# Patient Record
Sex: Male | Born: 1955
Health system: Southern US, Community
[De-identification: ages and names within clinical notes are randomized; demographics above are authoritative.]

## PROBLEM LIST (undated history)

## (undated) DIAGNOSIS — Z860100 Personal history of colon polyps, unspecified: Secondary | ICD-10-CM

## (undated) DIAGNOSIS — R600 Localized edema: Secondary | ICD-10-CM

## (undated) DIAGNOSIS — M2550 Pain in unspecified joint: Secondary | ICD-10-CM

## (undated) DIAGNOSIS — G4730 Sleep apnea, unspecified: Secondary | ICD-10-CM

## (undated) DIAGNOSIS — G4733 Obstructive sleep apnea (adult) (pediatric): Secondary | ICD-10-CM

## (undated) DIAGNOSIS — I5189 Other ill-defined heart diseases: Secondary | ICD-10-CM

## (undated) DIAGNOSIS — H9190 Unspecified hearing loss, unspecified ear: Secondary | ICD-10-CM

## (undated) DIAGNOSIS — E039 Hypothyroidism, unspecified: Secondary | ICD-10-CM

## (undated) DIAGNOSIS — H9319 Tinnitus, unspecified ear: Secondary | ICD-10-CM

## (undated) DIAGNOSIS — K829 Disease of gallbladder, unspecified: Secondary | ICD-10-CM

## (undated) DIAGNOSIS — E079 Disorder of thyroid, unspecified: Secondary | ICD-10-CM

## (undated) DIAGNOSIS — M1990 Unspecified osteoarthritis, unspecified site: Secondary | ICD-10-CM

## (undated) DIAGNOSIS — F32A Depression, unspecified: Secondary | ICD-10-CM

## (undated) DIAGNOSIS — M549 Dorsalgia, unspecified: Secondary | ICD-10-CM

## (undated) DIAGNOSIS — I209 Angina pectoris, unspecified: Secondary | ICD-10-CM

## (undated) DIAGNOSIS — E782 Mixed hyperlipidemia: Secondary | ICD-10-CM

## (undated) DIAGNOSIS — I10 Essential (primary) hypertension: Secondary | ICD-10-CM

## (undated) DIAGNOSIS — F419 Anxiety disorder, unspecified: Secondary | ICD-10-CM

## (undated) DIAGNOSIS — R0602 Shortness of breath: Secondary | ICD-10-CM

## (undated) DIAGNOSIS — I2510 Atherosclerotic heart disease of native coronary artery without angina pectoris: Secondary | ICD-10-CM

## (undated) DIAGNOSIS — K219 Gastro-esophageal reflux disease without esophagitis: Secondary | ICD-10-CM

## (undated) DIAGNOSIS — G510 Bell's palsy: Secondary | ICD-10-CM

## (undated) DIAGNOSIS — T300 Burn of unspecified body region, unspecified degree: Secondary | ICD-10-CM

## (undated) DIAGNOSIS — F329 Major depressive disorder, single episode, unspecified: Secondary | ICD-10-CM

## (undated) DIAGNOSIS — G709 Myoneural disorder, unspecified: Secondary | ICD-10-CM

## (undated) HISTORY — DX: Mixed hyperlipidemia: E78.2

## (undated) HISTORY — DX: Unspecified hearing loss, unspecified ear: H91.90

## (undated) HISTORY — PX: SKIN GRAFT: SHX250

## (undated) HISTORY — DX: Pain in unspecified joint: M25.50

## (undated) HISTORY — DX: Disease of gallbladder, unspecified: K82.9

## (undated) HISTORY — DX: Tinnitus, unspecified ear: H93.19

## (undated) HISTORY — DX: Shortness of breath: R06.02

## (undated) HISTORY — DX: Localized edema: R60.0

## (undated) HISTORY — DX: Angina pectoris, unspecified: I20.9

## (undated) HISTORY — DX: Major depressive disorder, single episode, unspecified: F32.9

## (undated) HISTORY — DX: Depression, unspecified: F32.A

## (undated) HISTORY — DX: Personal history of colon polyps, unspecified: Z86.0100

## (undated) HISTORY — PX: BLEPHAROPLASTY: SUR158

## (undated) HISTORY — DX: Dorsalgia, unspecified: M54.9

## (undated) HISTORY — DX: Essential (primary) hypertension: I10

## (undated) HISTORY — DX: Personal history of colonic polyps: Z86.010

## (undated) HISTORY — DX: Disorder of thyroid, unspecified: E07.9

## (undated) HISTORY — DX: Bell's palsy: G51.0

## (undated) HISTORY — DX: Sleep apnea, unspecified: G47.30

---
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
## 1988-08-26 HISTORY — PX: CHOLECYSTECTOMY: SHX55

## 2014-12-14 ENCOUNTER — Ambulatory Visit (INDEPENDENT_AMBULATORY_CARE_PROVIDER_SITE_OTHER): Payer: BLUE CROSS/BLUE SHIELD | Admitting: Internal Medicine

## 2014-12-14 ENCOUNTER — Encounter: Payer: Self-pay | Admitting: Internal Medicine

## 2014-12-14 VITALS — BP 142/92 | HR 75 | Temp 98.1°F | Wt 292.0 lb

## 2014-12-14 DIAGNOSIS — E039 Hypothyroidism, unspecified: Secondary | ICD-10-CM

## 2014-12-14 DIAGNOSIS — F32A Depression, unspecified: Secondary | ICD-10-CM | POA: Insufficient documentation

## 2014-12-14 DIAGNOSIS — F330 Major depressive disorder, recurrent, mild: Secondary | ICD-10-CM | POA: Insufficient documentation

## 2014-12-14 DIAGNOSIS — I10 Essential (primary) hypertension: Secondary | ICD-10-CM | POA: Diagnosis not present

## 2014-12-14 DIAGNOSIS — Z9989 Dependence on other enabling machines and devices: Secondary | ICD-10-CM

## 2014-12-14 DIAGNOSIS — G479 Sleep disorder, unspecified: Secondary | ICD-10-CM | POA: Insufficient documentation

## 2014-12-14 DIAGNOSIS — F329 Major depressive disorder, single episode, unspecified: Secondary | ICD-10-CM | POA: Diagnosis not present

## 2014-12-14 DIAGNOSIS — G4733 Obstructive sleep apnea (adult) (pediatric): Secondary | ICD-10-CM

## 2014-12-14 DIAGNOSIS — F325 Major depressive disorder, single episode, in full remission: Secondary | ICD-10-CM | POA: Insufficient documentation

## 2014-12-14 MED ORDER — HYDROCHLOROTHIAZIDE 25 MG PO TABS: 25.0000 mg | ORAL_TABLET | Freq: Every day | ORAL | Status: DC

## 2014-12-14 MED ORDER — LEVOTHYROXINE SODIUM 75 MCG PO TABS: 75.0000 ug | ORAL_TABLET | Freq: Every day | ORAL | Status: DC

## 2014-12-14 MED ORDER — TRAZODONE HCL 100 MG PO TABS: 200.0000 mg | ORAL_TABLET | Freq: Every day | ORAL | Status: DC

## 2014-12-14 MED ORDER — SERTRALINE HCL 100 MG PO TABS: 150.0000 mg | ORAL_TABLET | Freq: Every day | ORAL | Status: DC
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
## 2014-12-14 NOTE — Assessment & Plan Note (Signed)
Chronic but stable Will check CMET Zoloft refilled today

## 2014-12-14 NOTE — Patient Instructions (Signed)

## 2014-12-14 NOTE — Assessment & Plan Note (Signed)
BP elevated today because he has been out of meds Will refill HCTZ today Will check CBC and CMET today

## 2014-12-14 NOTE — Assessment & Plan Note (Signed)
Seems euthyroid on exam Will check TSH and  Free T4 today Synthroid refilled today

## 2014-12-14 NOTE — Assessment & Plan Note (Signed)
Continue CPAP Discussed benefits of weight loss on his sleep apnea

## 2014-12-14 NOTE — Assessment & Plan Note (Signed)
Slightly worse because he has been out of his trazadone Will refill today

## 2014-12-14 NOTE — Progress Notes (Signed)
Pre visit review using our clinic review tool, if applicable. No additional management support is needed unless otherwise documented below in the visit note. 

## 2014-12-15 LAB — CBC
HCT: 45.2 % (ref 39.0–52.0)
Hemoglobin: 15.1 g/dL (ref 13.0–17.0)
MCHC: 33.4 g/dL (ref 30.0–36.0)
MCV: 83.4 fl (ref 78.0–100.0)
Platelets: 223 10*3/uL (ref 150.0–400.0)
RBC: 5.41 Mil/uL (ref 4.22–5.81)
RDW: 13.5 % (ref 11.5–15.5)
WBC: 7.6 10*3/uL (ref 4.0–10.5)

## 2014-12-15 LAB — COMPREHENSIVE METABOLIC PANEL
ALT: 19 U/L (ref 0–53)
AST: 18 U/L (ref 0–37)
Albumin: 4 g/dL (ref 3.5–5.2)
Alkaline Phosphatase: 83 U/L (ref 39–117)
BUN: 14 mg/dL (ref 6–23)
CO2: 26 mEq/L (ref 19–32)
CREATININE: 0.92 mg/dL (ref 0.40–1.50)
Calcium: 9.2 mg/dL (ref 8.4–10.5)
Chloride: 104 mEq/L (ref 96–112)
GFR: 89.6 mL/min (ref >60.00)
Glucose, Bld: 76 mg/dL (ref 70–99)
Potassium: 4.1 mEq/L (ref 3.5–5.1)
Sodium: 137 mEq/L (ref 135–145)
Total Bilirubin: 0.5 mg/dL (ref 0.2–1.2)
Total Protein: 7.5 g/dL (ref 6.0–8.3)

## 2014-12-15 LAB — TSH: TSH: 5.78 u[IU]/mL — AB (ref 0.35–4.50)

## 2014-12-15 LAB — T4, FREE: FREE T4: 0.86 ng/dL (ref 0.60–1.60)

## 2014-12-23 ENCOUNTER — Other Ambulatory Visit: Payer: Self-pay | Admitting: Internal Medicine

## 2014-12-23 ENCOUNTER — Telehealth: Payer: Self-pay | Admitting: *Deleted

## 2014-12-23 ENCOUNTER — Telehealth: Payer: Self-pay | Admitting: Internal Medicine

## 2014-12-23 DIAGNOSIS — G4733 Obstructive sleep apnea (adult) (pediatric): Secondary | ICD-10-CM

## 2014-12-23 NOTE — Telephone Encounter (Signed)
Patient requests order for new CPAP mask be sent to [facility].

## 2014-12-23 NOTE — Telephone Encounter (Signed)
Left detailed msg on VM per HIPAA Need information on where the order needs to be faxed to and phone number

## 2014-12-23 NOTE — Telephone Encounter (Signed)
Patient returned [name]'s call.  [facility] phone # [phone].

## 2014-12-23 NOTE — Telephone Encounter (Signed)
Order has been faxed ad instructed

## 2014-12-23 NOTE — Telephone Encounter (Signed)
Patient returned Melanie's call. [facility] phone # [phone]. Fax# [phone]

## 2014-12-23 NOTE — Telephone Encounter (Signed)
RX printed and signed and placed in MYD box 

## 2014-12-25 ENCOUNTER — Encounter: Payer: Self-pay | Admitting: Internal Medicine

## 2014-12-26 ENCOUNTER — Other Ambulatory Visit: Payer: Self-pay | Admitting: Internal Medicine

## 2014-12-26 DIAGNOSIS — E039 Hypothyroidism, unspecified: Secondary | ICD-10-CM

## 2015-01-03 ENCOUNTER — Telehealth: Payer: Self-pay | Admitting: Internal Medicine

## 2015-01-03 ENCOUNTER — Telehealth: Payer: Self-pay

## 2015-01-03 NOTE — Telephone Encounter (Signed)
[name] called checking on  rx for cpap supplies and a copy of sleep study

## 2015-01-03 NOTE — Telephone Encounter (Signed)
[facility] left v/m requesting refills on sertraline, levothyroxine,trazodone and HCTZ; spoke with Sharyn Lull at [phone]. Sharyn Lull said pt has refills available but appeared that pt cancelled refill request. Sharyn Lull looked into further and said would resend the refills to the pt at verified home address.Michelle reprocessed request and pt should receive meds in 3-5 days.

## 2015-01-04 NOTE — Telephone Encounter (Signed)
This was faxed 12/20/14---order has been refaxed and machine states the fax was received

## 2015-06-07 ENCOUNTER — Other Ambulatory Visit: Payer: Self-pay | Admitting: Internal Medicine

## 2015-06-07 DIAGNOSIS — Z0000 Encounter for general adult medical examination without abnormal findings: Secondary | ICD-10-CM

## 2015-06-12 ENCOUNTER — Other Ambulatory Visit: Payer: BLUE CROSS/BLUE SHIELD

## 2015-06-13 ENCOUNTER — Other Ambulatory Visit
Admission: RE | Admit: 2015-06-13 | Discharge: 2015-06-13 | Disposition: A | Discharge status: Home / Self Care | Payer: Self-pay | Source: Ambulatory Visit | Attending: Internal Medicine | Admitting: Internal Medicine

## 2015-06-13 DIAGNOSIS — H16001 Unspecified corneal ulcer, right eye: Secondary | ICD-10-CM | POA: Insufficient documentation

## 2015-06-15 ENCOUNTER — Encounter: Payer: BLUE CROSS/BLUE SHIELD | Admitting: Internal Medicine

## 2015-06-15 ENCOUNTER — Telehealth: Payer: Self-pay | Admitting: Internal Medicine

## 2015-06-15 DIAGNOSIS — Z0289 Encounter for other administrative examinations: Secondary | ICD-10-CM

## 2015-06-15 NOTE — Telephone Encounter (Signed)
Pt did not come in for their appt today for cpe. Please let me know if pt needs to be contacted immediately for follow up or no follow up needed. Best phone number to contact pt is [phone].

## 2015-06-16 NOTE — Telephone Encounter (Signed)
Pt does need to reschedule

## 2015-06-17 LAB — WOUND CULTURE: Culture: NO GROWTH

## 2015-06-20 ENCOUNTER — Encounter: Payer: Self-pay | Admitting: Internal Medicine

## 2015-07-04 LAB — CULTURE, FUNGUS WITHOUT SMEAR

## 2016-05-14 ENCOUNTER — Encounter: Payer: Self-pay | Admitting: Internal Medicine

## 2017-05-27 ENCOUNTER — Encounter: Payer: Self-pay | Admitting: *Deleted

## 2017-05-27 ENCOUNTER — Emergency Department
Admission: EM | Admit: 2017-05-27 | Discharge: 2017-05-28 | Disposition: A | Discharge status: Home / Self Care | Payer: No Typology Code available for payment source | Attending: Student in an Organized Health Care Education/Training Program | Admitting: Student in an Organized Health Care Education/Training Program

## 2017-05-27 DIAGNOSIS — R300 Dysuria: Secondary | ICD-10-CM | POA: Diagnosis present

## 2017-05-27 DIAGNOSIS — Z79899 Other long term (current) drug therapy: Secondary | ICD-10-CM | POA: Diagnosis not present

## 2017-05-27 DIAGNOSIS — Z9049 Acquired absence of other specified parts of digestive tract: Secondary | ICD-10-CM | POA: Diagnosis not present

## 2017-05-27 DIAGNOSIS — I10 Essential (primary) hypertension: Secondary | ICD-10-CM | POA: Insufficient documentation

## 2017-05-27 DIAGNOSIS — N410 Acute prostatitis: Secondary | ICD-10-CM

## 2017-05-27 DIAGNOSIS — F329 Major depressive disorder, single episode, unspecified: Secondary | ICD-10-CM | POA: Insufficient documentation

## 2017-05-27 LAB — CBC WITH DIFFERENTIAL/PLATELET
Basophils Absolute: 0.1 10*3/uL (ref 0–0.1)
Basophils Relative: 1 %
EOS ABS: 0.1 10*3/uL (ref 0–0.7)
EOS PCT: 1 %
HCT: 42.4 % (ref 40.0–52.0)
Hemoglobin: 14.2 g/dL (ref 13.0–18.0)
LYMPHS ABS: 1.6 10*3/uL (ref 1.0–3.6)
LYMPHS PCT: 19 %
MCH: 28.3 pg (ref 26.0–34.0)
MCHC: 33.5 g/dL (ref 32.0–36.0)
MCV: 84.5 fL (ref 80.0–100.0)
MONOS PCT: 8 %
Monocytes Absolute: 0.7 10*3/uL (ref 0.2–1.0)
Neutro Abs: 5.9 10*3/uL (ref 1.4–6.5)
Neutrophils Relative %: 71 %
PLATELETS: 186 10*3/uL (ref 150–440)
RBC: 5.02 MIL/uL (ref 4.40–5.90)
RDW: 13.7 % (ref 11.5–14.5)
WBC: 8.3 10*3/uL (ref 3.8–10.6)

## 2017-05-27 LAB — COMPREHENSIVE METABOLIC PANEL
ALK PHOS: 92 U/L (ref 38–126)
ALT: 18 U/L (ref 17–63)
ANION GAP: 8 (ref 5–15)
AST: 18 U/L (ref 15–41)
Albumin: 4.1 g/dL (ref 3.5–5.0)
BILIRUBIN TOTAL: 0.5 mg/dL (ref 0.3–1.2)
BUN: 18 mg/dL (ref 6–20)
CALCIUM: 9 mg/dL (ref 8.9–10.3)
CO2: 28 mmol/L (ref 22–32)
Chloride: 106 mmol/L (ref 101–111)
Creatinine, Ser: 0.95 mg/dL (ref 0.61–1.24)
Glucose, Bld: 112 mg/dL — ABNORMAL HIGH (ref 65–99)
Potassium: 4 mmol/L (ref 3.5–5.1)
SODIUM: 142 mmol/L (ref 135–145)
TOTAL PROTEIN: 7.7 g/dL (ref 6.5–8.1)

## 2017-05-27 LAB — URINALYSIS, COMPLETE (UACMP) WITH MICROSCOPIC
BILIRUBIN URINE: NEGATIVE
GLUCOSE, UA: NEGATIVE mg/dL
HGB URINE DIPSTICK: NEGATIVE
Ketones, ur: NEGATIVE mg/dL
LEUKOCYTES UA: NEGATIVE
NITRITE: NEGATIVE
PH: 5 (ref 5.0–8.0)
Protein, ur: NEGATIVE mg/dL
SPECIFIC GRAVITY, URINE: 1.015 (ref 1.005–1.030)

## 2017-05-27 NOTE — ED Triage Notes (Signed)
Pt to triage via wheelchair.  Pt has dysuria for 1 day.  Hx kidney stones.  Pt reports nausea.  Pt alert.

## 2017-05-28 MED ORDER — TAMSULOSIN HCL 0.4 MG PO CAPS
ORAL_CAPSULE | ORAL | Status: AC
  Filled 2017-05-28: qty 1

## 2017-05-28 MED ORDER — TAMSULOSIN HCL 0.4 MG PO CAPS
0.4000 mg | ORAL_CAPSULE | Freq: Once | ORAL | Status: AC
  Administered 2017-05-28: 0.4 mg via ORAL

## 2017-05-28 MED ORDER — PHENAZOPYRIDINE HCL 100 MG PO TABS: 100.0000 mg | ORAL_TABLET | Freq: Three times a day (TID) | ORAL | 0 refills | Status: DC | PRN

## 2017-05-28 MED ORDER — LEVOFLOXACIN 750 MG PO TABS
ORAL_TABLET | ORAL | Status: AC
  Filled 2017-05-28: qty 1

## 2017-05-28 MED ORDER — LEVOFLOXACIN 750 MG PO TABS
750.0000 mg | ORAL_TABLET | Freq: Once | ORAL | Status: AC
  Administered 2017-05-28: 750 mg via ORAL

## 2017-05-28 MED ORDER — LEVOFLOXACIN 500 MG PO TABS: 500.0000 mg | ORAL_TABLET | Freq: Every day | ORAL | 0 refills | Status: AC

## 2017-05-28 NOTE — ED Provider Notes (Signed)
[facility] Emergency Department Provider Note    First MD Initiated Contact with Patient 05/28/17 0006     (approximate)  I have reviewed the triage vital signs and the nursing notes.   HISTORY  Chief Complaint Dysuria    HPI [name] is a 61 y.o. male with a history of prostatitis roughly 30 years ago presents with chief complaint of dysuria and increased frequency started today. No fevers. No nausea or vomiting. Denies any pain at rest. Does have a mild discomfort. States he has been straining to pass bowels over the past several days. Denies any flank pain. No chest pain or shortness of breath. States that when he is not urinating the pain as mild. States the one or 2 but burning and severe when he does urinate.   Past Medical History:  Diagnosis Date  . Depression   . Hypertension   . Sleep apnea   . Thyroid disease    Family History  Problem Relation Age of Onset  . Heart disease Mother   . Heart disease Father   . Hypertension Father   . Cancer Sister        Breast  . Cancer Sister        Breast   Past Surgical History:  Procedure Laterality Date  . CHOLECYSTECTOMY  1990  . SKIN GRAFT     upper body   Patient Active Problem List   Diagnosis Date Noted  . Depression 12/14/2014  . Sleep disturbance 12/14/2014  . Thyroid activity decreased 12/14/2014  . Essential hypertension 12/14/2014  . OSA on CPAP 12/14/2014      Prior to Admission medications   Medication Sig Start Date End Date Taking? Authorizing Provider  hydrochlorothiazide (HYDRODIURIL) 25 MG tablet Take 1 tablet (25 mg total) by mouth daily. 12/14/14   Jearld Fenton, NP  levothyroxine (SYNTHROID, LEVOTHROID) 75 MCG tablet Take 1 tablet (75 mcg total) by mouth daily before breakfast. 12/14/14   Jearld Fenton, NP  sertraline (ZOLOFT) 100 MG tablet Take 1.5 tablets (150 mg total) by mouth daily. 1 1/2 tablets at bedtime 12/14/14   Jearld Fenton, NP  traZODone  (DESYREL) 100 MG tablet Take 2 tablets (200 mg total) by mouth at bedtime. 12/14/14   Jearld Fenton, NP    Allergies Penicillins    Social History Social History  Substance Use Topics  . Smoking status: Never Smoker  . Smokeless tobacco: Never Used  . Alcohol use 0.0 oz/week     Comment: occasional    Review of Systems Patient denies headaches, rhinorrhea, blurry vision, numbness, shortness of breath, chest pain, edema, cough, abdominal pain, nausea, vomiting, diarrhea, dysuria, fevers, rashes or hallucinations unless otherwise stated above in HPI. ____________________________________________   PHYSICAL EXAM:  VITAL SIGNS: Vitals:   05/27/17 2115 05/28/17 0015  BP: (!) 145/89 (!) 157/99  Pulse: 74 60  Resp: 20 19  Temp: 98 F (36.7 C)   SpO2: 99% 98%    Constitutional: Alert and oriented. Well appearing and in no acute distress. Eyes: Conjunctivae are normal.  Head: Atraumatic. Nose: No congestion/rhinnorhea. Mouth/Throat: Mucous membranes are moist.   Neck: No stridor. Painless ROM.  Cardiovascular: Normal rate, regular rhythm. Grossly normal heart sounds.  Good peripheral circulation. Respiratory: Normal respiratory effort.  No retractions. Lungs CTAB. Gastrointestinal: Soft and nontender. No distention. No abdominal bruits. No CVA tenderness. Genitourinary: normal external genitalia, tender, boggy prostate without fluctuance or nodules Musculoskeletal: No lower extremity tenderness nor edema.  No joint effusions. Neurologic:  Normal speech and language. No gross focal neurologic deficits are appreciated. No facial droop Skin:  Skin is warm, dry and intact. No rash noted. Psychiatric: Mood and affect are normal. Speech and behavior are normal.  ____________________________________________   LABS (all labs ordered are listed, but only abnormal results are displayed)  Results for orders placed or performed during the hospital encounter of 05/27/17 (from the  past 24 hour(s))  Comprehensive metabolic panel     Status: Abnormal   Collection Time: 05/27/17  9:19 PM  Result Value Ref Range   Sodium 142 135 - 145 mmol/L   Potassium 4.0 3.5 - 5.1 mmol/L   Chloride 106 101 - 111 mmol/L   CO2 28 22 - 32 mmol/L   Glucose, Bld 112 (H) 65 - 99 mg/dL   BUN 18 6 - 20 mg/dL   Creatinine, Ser 0.95 0.61 - 1.24 mg/dL   Calcium 9.0 8.9 - 10.3 mg/dL   Total Protein 7.7 6.5 - 8.1 g/dL   Albumin 4.1 3.5 - 5.0 g/dL   AST 18 15 - 41 U/L   ALT 18 17 - 63 U/L   Alkaline Phosphatase 92 38 - 126 U/L   Total Bilirubin 0.5 0.3 - 1.2 mg/dL   GFR calc non Af Amer >60 >60 mL/min   GFR calc Af Amer >60 >60 mL/min   Anion gap 8 5 - 15  Urinalysis, Complete w Microscopic     Status: Abnormal   Collection Time: 05/27/17  9:19 PM  Result Value Ref Range   Color, Urine YELLOW (A) YELLOW   APPearance CLEAR (A) CLEAR   Specific Gravity, Urine 1.015 1.005 - 1.030   pH 5.0 5.0 - 8.0   Glucose, UA NEGATIVE NEGATIVE mg/dL   Hgb urine dipstick NEGATIVE NEGATIVE   Bilirubin Urine NEGATIVE NEGATIVE   Ketones, ur NEGATIVE NEGATIVE mg/dL   Protein, ur NEGATIVE NEGATIVE mg/dL   Nitrite NEGATIVE NEGATIVE   Leukocytes, UA NEGATIVE NEGATIVE   RBC / HPF 0-5 0 - 5 RBC/hpf   WBC, UA 0-5 0 - 5 WBC/hpf   Bacteria, UA RARE (A) NONE SEEN   Squamous Epithelial / LPF 0-5 (A) NONE SEEN   Mucus PRESENT   CBC with Differential     Status: None   Collection Time: 05/27/17  9:19 PM  Result Value Ref Range   WBC 8.3 3.8 - 10.6 K/uL   RBC 5.02 4.40 - 5.90 MIL/uL   Hemoglobin 14.2 13.0 - 18.0 g/dL   HCT 42.4 40.0 - 52.0 %   MCV 84.5 80.0 - 100.0 fL   MCH 28.3 26.0 - 34.0 pg   MCHC 33.5 32.0 - 36.0 g/dL   RDW 13.7 11.5 - 14.5 %   Platelets 186 150 - 440 K/uL   Neutrophils Relative % 71 %   Neutro Abs 5.9 1.4 - 6.5 K/uL   Lymphocytes Relative 19 %   Lymphs Abs 1.6 1.0 - 3.6 K/uL   Monocytes Relative 8 %   Monocytes Absolute 0.7 0.2 - 1.0 K/uL   Eosinophils Relative 1 %   Eosinophils  Absolute 0.1 0 - 0.7 K/uL   Basophils Relative 1 %   Basophils Absolute 0.1 0 - 0.1 K/uL   ____________________________________________ ____________________________________________  RADIOLOGY   ____________________________________________   PROCEDURES  Procedure(s) performed:  Procedures    Critical Care performed: no ____________________________________________   INITIAL IMPRESSION / ASSESSMENT AND PLAN / ED COURSE  Pertinent labs & imaging results that were  available during my care of the patient were reviewed by me and considered in my medical decision making (see chart for details).  DDX: prostatitis, uti, urinary obstruction, stone  [name] is a 61 y.o. who presents to the ED with evidence of acute prostatitis as described above. He is afebrile hemodynamic stable. Blood work is reassuring. There is no evidence of abscess. He has no suprapubic fullness to suggest obstruction and he isn't taking his bladder. Presentation is consistent with prostatitis and patient will be started on antibiotics with follow-up with urology.  Have discussed with the patient and available family all diagnostics and treatments performed thus far and all questions were answered to the best of my ability. The patient demonstrates understanding and agreement with plan.       ____________________________________________   FINAL CLINICAL IMPRESSION(S) / ED DIAGNOSES  Final diagnoses:  Prostatitis, acute      NEW MEDICATIONS STARTED DURING THIS VISIT:  New Prescriptions   No medications on file     Note:  This document was prepared using Dragon voice recognition software and may include unintentional dictation errors.    Merlyn Lot, MD 05/28/17 [phone]

## 2017-05-28 NOTE — ED Notes (Signed)

## 2017-05-28 NOTE — ED Notes (Signed)
ED Provider at bedside. 

## 2017-05-29 LAB — URINE CULTURE
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
## 2017-07-11 ENCOUNTER — Encounter (HOSPITAL_BASED_OUTPATIENT_CLINIC_OR_DEPARTMENT_OTHER)
Admission: RE | Admit: 2017-07-11 | Discharge: 2017-07-11 | Disposition: A | Discharge status: Home / Self Care | Payer: No Typology Code available for payment source | Source: Ambulatory Visit | Attending: Plastic Surgery | Admitting: Plastic Surgery

## 2017-07-11 ENCOUNTER — Encounter (HOSPITAL_BASED_OUTPATIENT_CLINIC_OR_DEPARTMENT_OTHER): Payer: Self-pay | Admitting: *Deleted

## 2017-07-11 ENCOUNTER — Other Ambulatory Visit: Payer: Self-pay

## 2017-07-11 DIAGNOSIS — Z01812 Encounter for preprocedural laboratory examination: Secondary | ICD-10-CM | POA: Insufficient documentation

## 2017-07-11 DIAGNOSIS — H0259 Other disorders affecting eyelid function: Secondary | ICD-10-CM | POA: Diagnosis not present

## 2017-07-11 DIAGNOSIS — Z01810 Encounter for preprocedural cardiovascular examination: Secondary | ICD-10-CM | POA: Diagnosis not present

## 2017-07-11 DIAGNOSIS — M436 Torticollis: Secondary | ICD-10-CM | POA: Insufficient documentation

## 2017-07-11 LAB — BASIC METABOLIC PANEL
Anion gap: 7 (ref 5–15)
BUN: 16 mg/dL (ref 6–20)
CHLORIDE: 104 mmol/L (ref 101–111)
CO2: 26 mmol/L (ref 22–32)
Calcium: 8.8 mg/dL — ABNORMAL LOW (ref 8.9–10.3)
Creatinine, Ser: 0.85 mg/dL (ref 0.61–1.24)
GFR calc Af Amer: >60 mL/min (ref >60)
GFR calc non Af Amer: >60 mL/min (ref >60)
GLUCOSE: 90 mg/dL (ref 65–99)
POTASSIUM: 4.1 mmol/L (ref 3.5–5.1)
Sodium: 137 mmol/L (ref 135–145)

## 2017-07-11 NOTE — Progress Notes (Signed)
   07/11/17 1102  OBSTRUCTIVE SLEEP APNEA  Have you ever been diagnosed with sleep apnea through a sleep study? Yes  If yes, do you have and use a CPAP or BPAP machine every night? 1  Do you know the presssure settings on your maching? Yes  Do you snore loudly (loud enough to be heard through closed doors)?  0  Do you often feel tired, fatigued, or sleepy during the daytime (such as falling asleep during driving or talking to someone)? 0  Has anyone observed you stop breathing during your sleep? 1  Do you have, or are you being treated for high blood pressure? 1  BMI more than 35 kg/m2? 1  Age > 50 (1-yes) 1  Neck circumference greater than:Male 16 inches or larger, Male 17inches or larger? 1  Male Gender (Yes=1) 1  Obstructive Sleep Apnea Score 6
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
## 2017-07-22 ENCOUNTER — Encounter (HOSPITAL_BASED_OUTPATIENT_CLINIC_OR_DEPARTMENT_OTHER): Payer: Self-pay | Admitting: *Deleted

## 2017-07-22 ENCOUNTER — Other Ambulatory Visit: Payer: Self-pay

## 2017-07-22 ENCOUNTER — Encounter (HOSPITAL_BASED_OUTPATIENT_CLINIC_OR_DEPARTMENT_OTHER)
Admission: RE | Disposition: A | Discharge status: Home / Self Care | Payer: Self-pay | Source: Ambulatory Visit | Attending: Plastic Surgery

## 2017-07-22 ENCOUNTER — Ambulatory Visit (HOSPITAL_BASED_OUTPATIENT_CLINIC_OR_DEPARTMENT_OTHER): Payer: No Typology Code available for payment source | Admitting: Anesthesiology

## 2017-07-22 ENCOUNTER — Ambulatory Visit (HOSPITAL_BASED_OUTPATIENT_CLINIC_OR_DEPARTMENT_OTHER)
Admission: RE | Admit: 2017-07-22 | Discharge: 2017-07-22 | Disposition: A | Discharge status: Home / Self Care | Payer: No Typology Code available for payment source | Source: Ambulatory Visit | Attending: Plastic Surgery | Admitting: Plastic Surgery

## 2017-07-22 DIAGNOSIS — E039 Hypothyroidism, unspecified: Secondary | ICD-10-CM | POA: Insufficient documentation

## 2017-07-22 DIAGNOSIS — Z79899 Other long term (current) drug therapy: Secondary | ICD-10-CM | POA: Insufficient documentation

## 2017-07-22 DIAGNOSIS — M436 Torticollis: Secondary | ICD-10-CM | POA: Insufficient documentation

## 2017-07-22 DIAGNOSIS — H02112 Cicatricial ectropion of right lower eyelid: Secondary | ICD-10-CM | POA: Insufficient documentation

## 2017-07-22 DIAGNOSIS — Z6841 Body Mass Index (BMI) 40.0 and over, adult: Secondary | ICD-10-CM | POA: Insufficient documentation

## 2017-07-22 DIAGNOSIS — L905 Scar conditions and fibrosis of skin: Secondary | ICD-10-CM | POA: Diagnosis not present

## 2017-07-22 DIAGNOSIS — I10 Essential (primary) hypertension: Secondary | ICD-10-CM | POA: Insufficient documentation

## 2017-07-22 DIAGNOSIS — G4733 Obstructive sleep apnea (adult) (pediatric): Secondary | ICD-10-CM | POA: Diagnosis not present

## 2017-07-22 HISTORY — PX: SCAR REVISION: SHX5285

## 2017-07-22 HISTORY — DX: Hypothyroidism, unspecified: E03.9

## 2017-07-22 HISTORY — PX: CANTHOPLASTY: SHX1286

## 2017-07-22 HISTORY — DX: Myoneural disorder, unspecified: G70.9

## 2017-07-22 HISTORY — DX: Burn of unspecified body region, unspecified degree: T30.0

## 2017-07-22 SURGERY — CANTHOPLASTY
Anesthesia: General | Site: Neck | Laterality: Right

## 2017-07-22 MED ORDER — PROPOFOL 10 MG/ML IV BOLUS
INTRAVENOUS | Status: DC | PRN
  Administered 2017-07-22 (×2): 100 mg via INTRAVENOUS

## 2017-07-22 MED ORDER — BSS IO SOLN
INTRAOCULAR | Status: DC | PRN
  Administered 2017-07-22: 1 via INTRAOCULAR

## 2017-07-22 MED ORDER — ONDANSETRON HCL 4 MG/2ML IJ SOLN: 4.0000 mg | Freq: Once | INTRAMUSCULAR | Status: DC | PRN

## 2017-07-22 MED ORDER — CELECOXIB 200 MG PO CAPS
ORAL_CAPSULE | ORAL | Status: AC
  Filled 2017-07-22: qty 1

## 2017-07-22 MED ORDER — FENTANYL CITRATE (PF) 100 MCG/2ML IJ SOLN
25.0000 ug | INTRAMUSCULAR | Status: DC | PRN
  Administered 2017-07-22: 50 ug via INTRAVENOUS

## 2017-07-22 MED ORDER — MIDAZOLAM HCL 2 MG/2ML IJ SOLN
INTRAMUSCULAR | Status: AC
  Filled 2017-07-22: qty 2

## 2017-07-22 MED ORDER — DEXAMETHASONE SODIUM PHOSPHATE 4 MG/ML IJ SOLN
INTRAMUSCULAR | Status: DC | PRN
  Administered 2017-07-22: 10 mg via INTRAVENOUS

## 2017-07-22 MED ORDER — FENTANYL CITRATE (PF) 100 MCG/2ML IJ SOLN
INTRAMUSCULAR | Status: AC
  Filled 2017-07-22: qty 2

## 2017-07-22 MED ORDER — OXYCODONE HCL 5 MG PO TABS: 5.0000 mg | ORAL_TABLET | ORAL | 0 refills | Status: DC | PRN

## 2017-07-22 MED ORDER — MIDAZOLAM HCL 2 MG/2ML IJ SOLN
1.0000 mg | INTRAMUSCULAR | Status: DC | PRN
  Administered 2017-07-22 (×2): 1 mg via INTRAVENOUS

## 2017-07-22 MED ORDER — CLINDAMYCIN PHOSPHATE 600 MG/50ML IV SOLN
INTRAVENOUS | Status: AC
  Filled 2017-07-22: qty 50

## 2017-07-22 MED ORDER — FENTANYL CITRATE (PF) 100 MCG/2ML IJ SOLN
50.0000 ug | INTRAMUSCULAR | Status: DC | PRN
  Administered 2017-07-22 (×2): 100 ug via INTRAVENOUS

## 2017-07-22 MED ORDER — BUPIVACAINE-EPINEPHRINE (PF) 0.25% -1:200000 IJ SOLN
INTRAMUSCULAR | Status: DC | PRN
  Administered 2017-07-22: 1.5 mL

## 2017-07-22 MED ORDER — TOBRAMYCIN-DEXAMETHASONE 0.3-0.1 % OP OINT
TOPICAL_OINTMENT | OPHTHALMIC | Status: DC | PRN
  Administered 2017-07-22: 1 via OPHTHALMIC

## 2017-07-22 MED ORDER — OXYCODONE HCL 5 MG PO TABS
ORAL_TABLET | ORAL | Status: AC
Start: 2017-07-22 — End: ?
  Filled 2017-07-22: qty 1

## 2017-07-22 MED ORDER — CLINDAMYCIN PHOSPHATE 600 MG/50ML IV SOLN
600.0000 mg | Freq: Once | INTRAVENOUS | Status: AC
  Administered 2017-07-22: 600 mg via INTRAVENOUS

## 2017-07-22 MED ORDER — SCOPOLAMINE 1 MG/3DAYS TD PT72: 1.0000 | MEDICATED_PATCH | Freq: Once | TRANSDERMAL | Status: DC | PRN

## 2017-07-22 MED ORDER — ROCURONIUM BROMIDE 100 MG/10ML IV SOLN
INTRAVENOUS | Status: DC | PRN
  Administered 2017-07-22: 50 mg via INTRAVENOUS

## 2017-07-22 MED ORDER — TOBRAMYCIN-DEXAMETHASONE 0.3-0.1 % OP OINT
TOPICAL_OINTMENT | OPHTHALMIC | Status: AC
  Filled 2017-07-22: qty 3.5

## 2017-07-22 MED ORDER — OXYCODONE HCL 5 MG PO TABS
5.0000 mg | ORAL_TABLET | Freq: Once | ORAL | Status: AC
Start: 2017-07-22 — End: 2017-07-22
  Administered 2017-07-22: 5 mg via ORAL

## 2017-07-22 MED ORDER — LACTATED RINGERS IV SOLN
INTRAVENOUS | Status: DC
  Administered 2017-07-22: 08:00:00 via INTRAVENOUS

## 2017-07-22 MED ORDER — SUGAMMADEX SODIUM 500 MG/5ML IV SOLN
INTRAVENOUS | Status: DC | PRN
  Administered 2017-07-22: 450 mg via INTRAVENOUS

## 2017-07-22 MED ORDER — BSS IO SOLN
INTRAOCULAR | Status: AC
  Filled 2017-07-22: qty 15

## 2017-07-22 MED ORDER — ACETAMINOPHEN 500 MG PO TABS
ORAL_TABLET | ORAL | Status: AC
  Filled 2017-07-22: qty 2

## 2017-07-22 MED ORDER — ACETAMINOPHEN 500 MG PO TABS
1000.0000 mg | ORAL_TABLET | Freq: Once | ORAL | Status: AC
  Administered 2017-07-22: 1000 mg via ORAL

## 2017-07-22 MED ORDER — CELECOXIB 200 MG PO CAPS
200.0000 mg | ORAL_CAPSULE | Freq: Once | ORAL | Status: AC
  Administered 2017-07-22: 200 mg via ORAL

## 2017-07-22 MED ORDER — 0.9 % SODIUM CHLORIDE (POUR BTL) OPTIME
TOPICAL | Status: DC | PRN
  Administered 2017-07-22: 1000 mL

## 2017-07-22 SURGICAL SUPPLY — 43 items
BLADE SURG 15 STRL LF DISP TIS (BLADE) ×2 IMPLANT
BLADE SURG 15 STRL SS (BLADE) ×1
BNDG GAUZE ELAST 4 BULKY (GAUZE/BANDAGES/DRESSINGS) IMPLANT
COVER BACK TABLE 60X90IN (DRAPES) ×3 IMPLANT
COVER MAYO STAND STRL (DRAPES) ×3 IMPLANT
DRAPE U-SHAPE 76X120 STRL (DRAPES) ×3 IMPLANT
DRSG TEGADERM 4X10 (GAUZE/BANDAGES/DRESSINGS) IMPLANT
DRSG TEGADERM 4X4.75 (GAUZE/BANDAGES/DRESSINGS) IMPLANT
ELECT NEEDLE BLADE 2-5/6 (NEEDLE) ×3 IMPLANT
ELECT REM PT RETURN 9FT ADLT (ELECTROSURGICAL) ×3
ELECTRODE REM PT RTRN 9FT ADLT (ELECTROSURGICAL) ×2 IMPLANT
GLOVE BIO SURGEON STRL SZ 6 (GLOVE) ×3 IMPLANT
GLOVE BIOGEL PI IND STRL 7.0 (GLOVE) ×4 IMPLANT
GLOVE BIOGEL PI IND STRL 7.5 (GLOVE) ×2 IMPLANT
GLOVE BIOGEL PI INDICATOR 7.0 (GLOVE) ×2
GLOVE BIOGEL PI INDICATOR 7.5 (GLOVE) ×1
GLOVE SURG SS PI 6.5 STRL IVOR (GLOVE) ×3 IMPLANT
GOWN STRL REUS W/ TWL LRG LVL3 (GOWN DISPOSABLE) ×4 IMPLANT
GOWN STRL REUS W/TWL LRG LVL3 (GOWN DISPOSABLE) ×2
MATRIX TISSUE MESHED BI 4X10 (Tissue) ×3 IMPLANT
NEEDLE HYPO 30GX1 BEV (NEEDLE) IMPLANT
NEEDLE PRECISIONGLIDE 27X1.5 (NEEDLE) ×3 IMPLANT
PACK BASIN DAY SURGERY FS (CUSTOM PROCEDURE TRAY) ×3 IMPLANT
PENCIL BUTTON HOLSTER BLD 10FT (ELECTRODE) ×3 IMPLANT
SHEILD EYE MED CORNL SHD 22X21 (OPHTHALMIC RELATED) ×3
SHIELD EYE MED CORNL SHD 22X21 (OPHTHALMIC RELATED) ×2 IMPLANT
SLEEVE SCD COMPRESS KNEE MED (MISCELLANEOUS) ×3 IMPLANT
STAPLER VISISTAT 35W (STAPLE) ×3 IMPLANT
SUT CHROMIC 4 0 PS 2 18 (SUTURE) ×6 IMPLANT
SUT MERSILENE 5 0 P 3 (SUTURE) IMPLANT
SUT MERSILENE 5-0 (SUTURE) IMPLANT
SUT PLAIN 5 0 P 3 18 (SUTURE) ×3 IMPLANT
SUT PROLENE 6 0 P 1 18 (SUTURE) IMPLANT
SUT SILK 4 0 P 3 (SUTURE) ×3 IMPLANT
SUT VIC AB 3-0 SH 27 (SUTURE) ×1
SUT VIC AB 3-0 SH 27X BRD (SUTURE) ×2 IMPLANT
SUT VIC AB 5-0 P-3 18X BRD (SUTURE) IMPLANT
SUT VIC AB 5-0 P3 18 (SUTURE)
SYR CONTROL 10ML LL (SYRINGE) ×3 IMPLANT
TOWEL OR 17X24 6PK STRL BLUE (TOWEL DISPOSABLE) ×6 IMPLANT
TRAY DSU PREP LF (CUSTOM PROCEDURE TRAY) ×3 IMPLANT
TUBE CONNECTING 20X1/4 (TUBING) ×3 IMPLANT
YANKAUER SUCT BULB TIP NO VENT (SUCTIONS) ×3 IMPLANT

## 2017-07-22 NOTE — Transfer of Care (Signed)
Immediate Anesthesia Transfer of Care Note  Patient: [name]  Procedure(s) Performed: RIGHT LATERAL CANTHOPLASTY (Right Eye) RELEASE OF NECK BURN CONTRACTURE WITH APPLICATION OF INTEGRA  AND VAC (N/A Neck)  Patient Location: PACU  Anesthesia Type:General  Level of Consciousness: awake, alert  and oriented  Airway & Oxygen Therapy: Patient Spontanous Breathing and Patient connected to face mask oxygen  Post-op Assessment: Report given to RN and Post -op Vital signs reviewed and stable  Post vital signs: Reviewed and stable  Last Vitals:  Vitals:   07/22/17 0732  BP: (!) 146/70  Pulse: 75  Resp: 18  Temp: 36.8 C  SpO2: 97%    Last Pain:  Vitals:   07/22/17 0732  TempSrc: Oral      Patients Stated Pain Goal: 0 (68/11/57 2620)  Complications: No apparent anesthesia complications

## 2017-07-22 NOTE — Anesthesia Procedure Notes (Signed)
Procedure Name: Intubation Date/Time: 07/22/2017 9:16 AM Performed by: Willa Frater, CRNA Pre-anesthesia Checklist: Patient identified, Emergency Drugs available, Suction available and Patient being monitored Patient Re-evaluated:Patient Re-evaluated prior to induction Oxygen Delivery Method: Circle system utilized Preoxygenation: Pre-oxygenation with 100% oxygen Induction Type: IV induction Ventilation: Mask ventilation without difficulty Laryngoscope Size: Glidescope and 3 Grade View: Grade II Tube type: Oral Tube size: 7.0 mm Number of attempts: 1 Airway Equipment and Method: Stylet,  Oral airway and Video-laryngoscopy Placement Confirmation: ETT inserted through vocal cords under direct vision,  positive ETCO2 and breath sounds checked- equal and bilateral Secured at: 23 cm Tube secured with: Tape Dental Injury: Teeth and Oropharynx as per pre-operative assessment  Difficulty Due To: Difficulty was anticipated and Difficult Airway- due to reduced neck mobility
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
## 2017-07-22 NOTE — Discharge Instructions (Signed)

## 2017-07-22 NOTE — Anesthesia Postprocedure Evaluation (Signed)
Anesthesia Post Note  Patient: Imani Sherrin  Procedure(s) Performed: RIGHT LATERAL CANTHOPLASTY (Right Eye) RELEASE OF NECK BURN CONTRACTURE WITH APPLICATION OF INTEGRA  AND VAC (N/A Neck)     Patient location during evaluation: PACU Anesthesia Type: General Level of consciousness: awake and alert Pain management: pain level controlled Vital Signs Assessment: post-procedure vital signs reviewed and stable Respiratory status: spontaneous breathing, nonlabored ventilation, respiratory function stable and patient connected to nasal cannula oxygen Cardiovascular status: blood pressure returned to baseline and stable Postop Assessment: no apparent nausea or vomiting Anesthetic complications: no    Last Vitals:  Vitals:   07/22/17 1130 07/22/17 1145  BP: (!) 148/93 (!) 155/92  Pulse: 75 76  Resp: 13 14  Temp:  (!) 36.3 C  SpO2: 97% 95%    Last Pain:  Vitals:   07/22/17 1145  TempSrc:   PainSc: 3                  Ryan P Ellender
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
## 2017-07-22 NOTE — Op Note (Signed)
Operative Note   DATE OF OPERATION: 11.27.18  LOCATION: [facility]-outpatient  SURGICAL DIVISION: Plastic Surgery  PREOPERATIVE DIAGNOSES:  1. Right lower lid ectropion cicatricial 2. Burn contracture neck  POSTOPERATIVE DIAGNOSES:  same  PROCEDURE:  1. Right lateral canthoplasty 2. Release burn contracture neck 3. Application Integra 5 x 23 cm to neck  SURGEON: Irene Limbo MD MBA  ASSISTANT: none  ANESTHESIA:  General.   EBL: 12 ml  COMPLICATIONS: None immediate.   INDICATIONS FOR PROCEDURE:  The patient, [name], is a 61 y.o. male born on [birth year], is here for release of burn contracture neck and right canthoplasty. He has had multiple prior skin graft and adjacent tissue transfer of neck with recurrent contracture, plan Integra.    FINDINGS: Following release neck scar, open wound neck 5 x 23 cm present. Following canthoplasty, able to distract lower lid from globe 2 mm.  DESCRIPTION OF PROCEDURE:  The patient's operative site was marked with the patient in the preoperative area. The patient was taken to the operating room. SCDs were placed and IV antibiotics were given. Ophthalmic lubricant applied and right scleral shield placed. The patient's operative site was prepped and draped in a sterile fashion. A time out was performed and all information was confirmed to be correct.  Local anesthetic infiltrated to perform right infraorbital n block and over lateral orbital rim. Incision made over right lateral orbital wall in natural skin tension line. Orbicularis oculi muscle divided in direction of fibers and dissection carried to periosteum of lateral orbital rim. The inferior limb of lateral canthal tendon was identified and divided. 5-0 mersilene suture placed though canthal tendon and this was secured to periosteum of lateral orbital rim superior to level of pupil. Incision closed with 5-0 plain gut suture.   I then directed attention to neck. Incision made at  base of neck in prior scar. The transverse incision extended from left to right sternocleidomastoid muscles. Incision carried through subcutaneous tissue to platysma. Skin flaps dissected superiorly and inferiorly in limited fashion over muscle. The external jugular vessels identified and preserved. Integra meshed dermal substrate inset over defect with 4-0 chromic. Total area Integra utilized 5 x 23 cm Adaptic applied over Integra followed by VAC. VAC bolster set to 125 mm Hg continuous.  Scleral shield removed and eye irrigated with basic salt solution. Tobradex ophthalmic ointment placed in right eye.  The patient was allowed to wake from anesthesia, extubated and taken to the recovery room in satisfactory condition.   SPECIMENS: none  DRAINS: none  Irene Limbo, MD [facility] : [facility] Plastic & Reconstructive Surgery [phone], pin [phone]

## 2017-07-23 ENCOUNTER — Encounter (HOSPITAL_BASED_OUTPATIENT_CLINIC_OR_DEPARTMENT_OTHER): Payer: Self-pay | Admitting: Plastic Surgery

## 2017-07-26 HISTORY — PX: NECK SURGERY: SHX720
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
## 2017-08-14 ENCOUNTER — Encounter (HOSPITAL_BASED_OUTPATIENT_CLINIC_OR_DEPARTMENT_OTHER): Payer: Self-pay | Admitting: *Deleted

## 2017-08-25 ENCOUNTER — Encounter (HOSPITAL_BASED_OUTPATIENT_CLINIC_OR_DEPARTMENT_OTHER)
Admission: RE | Disposition: A | Discharge status: Home / Self Care | Payer: Self-pay | Source: Ambulatory Visit | Attending: Plastic Surgery

## 2017-08-25 ENCOUNTER — Ambulatory Visit (HOSPITAL_BASED_OUTPATIENT_CLINIC_OR_DEPARTMENT_OTHER): Payer: No Typology Code available for payment source | Admitting: Anesthesiology

## 2017-08-25 ENCOUNTER — Encounter (HOSPITAL_BASED_OUTPATIENT_CLINIC_OR_DEPARTMENT_OTHER): Payer: Self-pay | Admitting: Anesthesiology

## 2017-08-25 ENCOUNTER — Ambulatory Visit (HOSPITAL_BASED_OUTPATIENT_CLINIC_OR_DEPARTMENT_OTHER)
Admission: RE | Admit: 2017-08-25 | Discharge: 2017-08-25 | Disposition: A | Discharge status: Home / Self Care | Payer: No Typology Code available for payment source | Source: Ambulatory Visit | Attending: Plastic Surgery | Admitting: Plastic Surgery

## 2017-08-25 ENCOUNTER — Other Ambulatory Visit: Payer: Self-pay

## 2017-08-25 DIAGNOSIS — F329 Major depressive disorder, single episode, unspecified: Secondary | ICD-10-CM | POA: Insufficient documentation

## 2017-08-25 DIAGNOSIS — L905 Scar conditions and fibrosis of skin: Secondary | ICD-10-CM | POA: Diagnosis not present

## 2017-08-25 DIAGNOSIS — M436 Torticollis: Secondary | ICD-10-CM | POA: Diagnosis not present

## 2017-08-25 DIAGNOSIS — X088XXS Exposure to other specified smoke, fire and flames, sequela: Secondary | ICD-10-CM | POA: Insufficient documentation

## 2017-08-25 DIAGNOSIS — E039 Hypothyroidism, unspecified: Secondary | ICD-10-CM | POA: Diagnosis not present

## 2017-08-25 DIAGNOSIS — G4730 Sleep apnea, unspecified: Secondary | ICD-10-CM | POA: Insufficient documentation

## 2017-08-25 DIAGNOSIS — T2030XS Burn of third degree of head, face, and neck, unspecified site, sequela: Secondary | ICD-10-CM | POA: Diagnosis not present

## 2017-08-25 DIAGNOSIS — Z6841 Body Mass Index (BMI) 40.0 and over, adult: Secondary | ICD-10-CM | POA: Diagnosis not present

## 2017-08-25 DIAGNOSIS — T3133 Burns involving 30-39% of body surface with 30-39% third degree burns: Secondary | ICD-10-CM | POA: Insufficient documentation

## 2017-08-25 DIAGNOSIS — I10 Essential (primary) hypertension: Secondary | ICD-10-CM | POA: Insufficient documentation

## 2017-08-25 DIAGNOSIS — H02112 Cicatricial ectropion of right lower eyelid: Secondary | ICD-10-CM | POA: Insufficient documentation

## 2017-08-25 HISTORY — PX: SKIN SPLIT GRAFT: SHX444

## 2017-08-25 LAB — POCT I-STAT, CHEM 8
BUN: 13 mg/dL (ref 6–20)
CALCIUM ION: 1.14 mmol/L — AB (ref 1.15–1.40)
CHLORIDE: 103 mmol/L (ref 101–111)
CREATININE: 0.8 mg/dL (ref 0.61–1.24)
GLUCOSE: 96 mg/dL (ref 65–99)
HCT: 44 % (ref 39.0–52.0)
Hemoglobin: 15 g/dL (ref 13.0–17.0)
POTASSIUM: 4 mmol/L (ref 3.5–5.1)
Sodium: 143 mmol/L (ref 135–145)
TCO2: 27 mmol/L (ref 22–32)

## 2017-08-25 SURGERY — APPLICATION, GRAFT, SKIN, SPLIT-THICKNESS
Anesthesia: General | Site: Neck

## 2017-08-25 MED ORDER — CLINDAMYCIN PHOSPHATE 900 MG/50ML IV SOLN
INTRAVENOUS | Status: AC
  Filled 2017-08-25: qty 50

## 2017-08-25 MED ORDER — HYDROMORPHONE HCL 1 MG/ML IJ SOLN: 0.2500 mg | INTRAMUSCULAR | Status: DC | PRN

## 2017-08-25 MED ORDER — BUPIVACAINE-EPINEPHRINE (PF) 0.25% -1:200000 IJ SOLN
INTRAMUSCULAR | Status: AC
  Filled 2017-08-25: qty 30

## 2017-08-25 MED ORDER — BUPIVACAINE-EPINEPHRINE 0.25% -1:200000 IJ SOLN
INTRAMUSCULAR | Status: DC | PRN
  Administered 2017-08-25: 20 mL

## 2017-08-25 MED ORDER — FENTANYL CITRATE (PF) 100 MCG/2ML IJ SOLN
INTRAMUSCULAR | Status: AC
  Filled 2017-08-25: qty 2

## 2017-08-25 MED ORDER — OXYCODONE HCL 5 MG/5ML PO SOLN: 5.0000 mg | Freq: Once | ORAL | Status: DC | PRN

## 2017-08-25 MED ORDER — ONDANSETRON HCL 4 MG/2ML IJ SOLN
INTRAMUSCULAR | Status: DC | PRN
  Administered 2017-08-25: 4 mg via INTRAVENOUS

## 2017-08-25 MED ORDER — PROPOFOL 10 MG/ML IV BOLUS
INTRAVENOUS | Status: DC | PRN
  Administered 2017-08-25: 250 mg via INTRAVENOUS

## 2017-08-25 MED ORDER — PROMETHAZINE HCL 25 MG/ML IJ SOLN
6.2500 mg | INTRAMUSCULAR | Status: DC | PRN
  Administered 2017-08-25: 6.25 mg via INTRAVENOUS

## 2017-08-25 MED ORDER — SCOPOLAMINE 1 MG/3DAYS TD PT72: 1.0000 | MEDICATED_PATCH | Freq: Once | TRANSDERMAL | Status: DC | PRN

## 2017-08-25 MED ORDER — CLINDAMYCIN PHOSPHATE 900 MG/50ML IV SOLN
900.0000 mg | INTRAVENOUS | Status: AC
  Administered 2017-08-25: 900 mg via INTRAVENOUS

## 2017-08-25 MED ORDER — LIDOCAINE 2% (20 MG/ML) 5 ML SYRINGE
INTRAMUSCULAR | Status: DC | PRN
  Administered 2017-08-25: 100 mg via INTRAVENOUS

## 2017-08-25 MED ORDER — LACTATED RINGERS IV SOLN
INTRAVENOUS | Status: DC
  Administered 2017-08-25 (×2): via INTRAVENOUS

## 2017-08-25 MED ORDER — SUGAMMADEX SODIUM 200 MG/2ML IV SOLN
INTRAVENOUS | Status: DC | PRN
  Administered 2017-08-25: 200 mg via INTRAVENOUS

## 2017-08-25 MED ORDER — MEPERIDINE HCL 25 MG/ML IJ SOLN: 6.2500 mg | INTRAMUSCULAR | Status: DC | PRN

## 2017-08-25 MED ORDER — OXYCODONE HCL 5 MG PO TABS: 5.0000 mg | ORAL_TABLET | ORAL | 0 refills | Status: DC | PRN

## 2017-08-25 MED ORDER — MINERAL OIL LIGHT 100 % EX OIL
TOPICAL_OIL | CUTANEOUS | Status: DC | PRN
  Administered 2017-08-25: 1 via TOPICAL

## 2017-08-25 MED ORDER — ROCURONIUM BROMIDE 100 MG/10ML IV SOLN
INTRAVENOUS | Status: DC | PRN
  Administered 2017-08-25: 50 mg via INTRAVENOUS

## 2017-08-25 MED ORDER — ONDANSETRON HCL 4 MG/2ML IJ SOLN
INTRAMUSCULAR | Status: AC
  Filled 2017-08-25: qty 2

## 2017-08-25 MED ORDER — MINERAL OIL LIGHT 100 % EX OIL
TOPICAL_OIL | CUTANEOUS | Status: AC
  Filled 2017-08-25: qty 25

## 2017-08-25 MED ORDER — DEXAMETHASONE SODIUM PHOSPHATE 4 MG/ML IJ SOLN
INTRAMUSCULAR | Status: DC | PRN
  Administered 2017-08-25: 10 mg via INTRAVENOUS

## 2017-08-25 MED ORDER — MIDAZOLAM HCL 2 MG/2ML IJ SOLN
INTRAMUSCULAR | Status: AC
  Filled 2017-08-25: qty 2

## 2017-08-25 MED ORDER — PROMETHAZINE HCL 25 MG/ML IJ SOLN
INTRAMUSCULAR | Status: AC
  Filled 2017-08-25: qty 1

## 2017-08-25 MED ORDER — FENTANYL CITRATE (PF) 100 MCG/2ML IJ SOLN
50.0000 ug | INTRAMUSCULAR | Status: DC | PRN
  Administered 2017-08-25 (×2): 50 ug via INTRAVENOUS

## 2017-08-25 MED ORDER — PROPOFOL 10 MG/ML IV BOLUS
INTRAVENOUS | Status: AC
  Filled 2017-08-25: qty 20

## 2017-08-25 MED ORDER — MIDAZOLAM HCL 2 MG/2ML IJ SOLN
1.0000 mg | INTRAMUSCULAR | Status: DC | PRN
  Administered 2017-08-25: 2 mg via INTRAVENOUS

## 2017-08-25 MED ORDER — DEXAMETHASONE SODIUM PHOSPHATE 10 MG/ML IJ SOLN
INTRAMUSCULAR | Status: AC
  Filled 2017-08-25: qty 1

## 2017-08-25 MED ORDER — OXYCODONE HCL 5 MG PO TABS: 5.0000 mg | ORAL_TABLET | Freq: Once | ORAL | Status: DC | PRN

## 2017-08-25 SURGICAL SUPPLY — 70 items
BANDAGE ACE 3X5.8 VEL STRL LF (GAUZE/BANDAGES/DRESSINGS) IMPLANT
BANDAGE ACE 4X5 VEL STRL LF (GAUZE/BANDAGES/DRESSINGS) IMPLANT
BANDAGE ACE 6X5 VEL STRL LF (GAUZE/BANDAGES/DRESSINGS) ×2 IMPLANT
BENZOIN TINCTURE PRP APPL 2/3 (GAUZE/BANDAGES/DRESSINGS) ×8 IMPLANT
BLADE CLIPPER SURG (BLADE) ×2 IMPLANT
BLADE DERMATOME SS (BLADE) ×2 IMPLANT
BLADE SURG 10 STRL SS (BLADE) ×2 IMPLANT
BLADE SURG 15 STRL LF DISP TIS (BLADE) ×1 IMPLANT
BLADE SURG 15 STRL SS (BLADE) ×1
BNDG COHESIVE 4X5 TAN STRL (GAUZE/BANDAGES/DRESSINGS) IMPLANT
BNDG GAUZE ELAST 4 BULKY (GAUZE/BANDAGES/DRESSINGS) ×2 IMPLANT
CANISTER SUCT 1200ML W/VALVE (MISCELLANEOUS) IMPLANT
COTTONBALL LRG STERILE PKG (GAUZE/BANDAGES/DRESSINGS) IMPLANT
COVER BACK TABLE 60X90IN (DRAPES) ×2 IMPLANT
COVER MAYO STAND STRL (DRAPES) ×2 IMPLANT
DECANTER SPIKE VIAL GLASS SM (MISCELLANEOUS) IMPLANT
DERMABOND ADVANCED (GAUZE/BANDAGES/DRESSINGS)
DERMABOND ADVANCED .7 DNX12 (GAUZE/BANDAGES/DRESSINGS) IMPLANT
DERMACARRIERS GRAFT 1 TO 1.5 (DISPOSABLE) ×2
DRAPE LAPAROTOMY 100X72 PEDS (DRAPES) IMPLANT
DRAPE SURG 17X23 STRL (DRAPES) IMPLANT
DRAPE U-SHAPE 76X120 STRL (DRAPES) ×2 IMPLANT
DRSG ADAPTIC 3X8 NADH LF (GAUZE/BANDAGES/DRESSINGS) ×2 IMPLANT
DRSG EMULSION OIL 3X3 NADH (GAUZE/BANDAGES/DRESSINGS) IMPLANT
DRSG PAD ABDOMINAL 8X10 ST (GAUZE/BANDAGES/DRESSINGS) ×2 IMPLANT
ELECT COATED BLADE 2.86 ST (ELECTRODE) IMPLANT
ELECT NEEDLE BLADE 2-5/6 (NEEDLE) IMPLANT
ELECT REM PT RETURN 9FT ADLT (ELECTROSURGICAL)
ELECTRODE REM PT RTRN 9FT ADLT (ELECTROSURGICAL) IMPLANT
GAUZE SPONGE 4X4 12PLY STRL (GAUZE/BANDAGES/DRESSINGS) IMPLANT
GAUZE SPONGE 4X4 12PLY STRL LF (GAUZE/BANDAGES/DRESSINGS) IMPLANT
GAUZE XEROFORM 1X8 LF (GAUZE/BANDAGES/DRESSINGS) IMPLANT
GAUZE XEROFORM 5X9 LF (GAUZE/BANDAGES/DRESSINGS) ×2 IMPLANT
GLOVE BIO SURGEON STRL SZ 6 (GLOVE) ×2 IMPLANT
GLOVE BIO SURGEON STRL SZ 6.5 (GLOVE) IMPLANT
GOWN STRL REUS W/ TWL LRG LVL3 (GOWN DISPOSABLE) ×2 IMPLANT
GOWN STRL REUS W/TWL LRG LVL3 (GOWN DISPOSABLE) ×2
GRAFT DERMACARRIERS 1 TO 1.5 (DISPOSABLE) ×1 IMPLANT
HYDROGEN PEROXIDE 16OZ (MISCELLANEOUS) ×2 IMPLANT
NEEDLE HYPO 25X1 1.5 SAFETY (NEEDLE) ×2 IMPLANT
NS IRRIG 1000ML POUR BTL (IV SOLUTION) IMPLANT
PACK BASIN DAY SURGERY FS (CUSTOM PROCEDURE TRAY) ×2 IMPLANT
PAD CAST 3X4 CTTN HI CHSV (CAST SUPPLIES) IMPLANT
PAD CAST 4YDX4 CTTN HI CHSV (CAST SUPPLIES) IMPLANT
PADDING CAST COTTON 3X4 STRL (CAST SUPPLIES)
PADDING CAST COTTON 4X4 STRL (CAST SUPPLIES)
PENCIL BUTTON HOLSTER BLD 10FT (ELECTRODE) IMPLANT
SHEET MEDIUM DRAPE 40X70 STRL (DRAPES) IMPLANT
SPONGE LAP 18X18 X RAY DECT (DISPOSABLE) ×2 IMPLANT
STAPLER VISISTAT 35W (STAPLE) ×2 IMPLANT
STOCKINETTE 4X48 STRL (DRAPES) IMPLANT
STOCKINETTE 6  STRL (DRAPES)
STOCKINETTE 6 STRL (DRAPES) IMPLANT
STOCKINETTE IMPERVIOUS LG (DRAPES) IMPLANT
STRIP CLOSURE SKIN 1/2X4 (GAUZE/BANDAGES/DRESSINGS) IMPLANT
SURGILUBE 2OZ TUBE FLIPTOP (MISCELLANEOUS) IMPLANT
SUT CHROMIC 4 0 PS 2 18 (SUTURE) ×4 IMPLANT
SUT CHROMIC 5 0 P 3 (SUTURE) IMPLANT
SUT MNCRL AB 4-0 PS2 18 (SUTURE) IMPLANT
SUT SILK 3 0 SH CR/8 (SUTURE) IMPLANT
SUT SILK 4 0 PS 2 (SUTURE) IMPLANT
SUT VIC AB 5-0 P-3 18X BRD (SUTURE) IMPLANT
SUT VIC AB 5-0 P3 18 (SUTURE)
SYR BULB 3OZ (MISCELLANEOUS) ×2 IMPLANT
SYR CONTROL 10ML LL (SYRINGE) ×2 IMPLANT
TOWEL OR 17X24 6PK STRL BLUE (TOWEL DISPOSABLE) ×4 IMPLANT
TRAY DSU PREP LF (CUSTOM PROCEDURE TRAY) ×2 IMPLANT
TUBE CONNECTING 20X1/4 (TUBING) IMPLANT
UNDERPAD 30X30 (UNDERPADS AND DIAPERS) IMPLANT
YANKAUER SUCT BULB TIP NO VENT (SUCTIONS) IMPLANT
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
## 2017-08-25 NOTE — Discharge Instructions (Signed)

## 2017-08-25 NOTE — Anesthesia Procedure Notes (Signed)
Procedure Name: Intubation Date/Time: 08/25/2017 10:13 AM Performed by: Maryella Shivers, CRNA Pre-anesthesia Checklist: Patient identified, Emergency Drugs available, Suction available and Patient being monitored Patient Re-evaluated:Patient Re-evaluated prior to induction Oxygen Delivery Method: Circle system utilized Preoxygenation: Pre-oxygenation with 100% oxygen Induction Type: IV induction Ventilation: Two handed mask ventilation required Tube type: Oral Tube size: 8.0 mm Number of attempts: 1 Airway Equipment and Method: Oral airway,  Video-laryngoscopy and Rigid stylet Placement Confirmation: ETT inserted through vocal cords under direct vision,  positive ETCO2 and breath sounds checked- equal and bilateral Secured at: 23 cm Tube secured with: Tape Dental Injury: Teeth and Oropharynx as per pre-operative assessment  Difficulty Due To: Difficulty was anticipated and Difficult Airway- due to reduced neck mobility

## 2017-08-25 NOTE — Transfer of Care (Signed)
Immediate Anesthesia Transfer of Care Note  Patient: [name]  Procedure(s) Performed: SKIN GRAFT SPLIT THICKNESS FROM RIGHT OR LEFT THIGH TO NECK (N/A Neck)  Patient Location: PACU  Anesthesia Type:General  Level of Consciousness: awake, alert  and oriented  Airway & Oxygen Therapy: Patient Spontanous Breathing and Patient connected to face mask oxygen  Post-op Assessment: Report given to RN and Post -op Vital signs reviewed and stable  Post vital signs: Reviewed and stable  Last Vitals:  Vitals:   08/25/17 1143 08/25/17 1144  BP:    Pulse: 85 83  Resp: (P) 18 (!) 7  Temp: (P) 36.5 C   SpO2: 95% 96%    Last Pain:  Vitals:   08/25/17 0920  TempSrc: Oral         Complications: No apparent anesthesia complications

## 2017-08-25 NOTE — Anesthesia Postprocedure Evaluation (Signed)
Anesthesia Post Note  Patient: [name]  Procedure(s) Performed: SKIN GRAFT SPLIT THICKNESS FROM RIGHT OR LEFT THIGH TO NECK (N/A Neck)     Anesthesia Post Evaluation  Last Vitals:  Vitals:   08/25/17 1230 08/25/17 1319  BP: (!) 148/78 (!) 150/81  Pulse: 74 75  Resp: (!) 7 18  Temp:  36.8 C  SpO2: 96% 95%    Last Pain:  Vitals:   08/25/17 1319  TempSrc:   PainSc: 0-No pain                 Nolon Nations
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
## 2017-08-25 NOTE — Interval H&P Note (Signed)
History and Physical Interval Note:  08/25/2017 9:35 AM  [name]  has presented today for surgery, with the diagnosis of [place]  The various methods of treatment have been discussed with the patient and family. After consideration of risks, benefits and other options for treatment, the patient has consented to  Procedure(s): SKIN GRAFT SPLIT THICKNESS FROM RIGHT OR LEFT THIGH TO NECK (N/A) as a surgical intervention .  The patient's history has been reviewed, patient examined, no change in status, stable for surgery.  I have reviewed the patient's chart and labs.  Questions were answered to the patient's satisfaction.     Odelle Kosier

## 2017-08-27 ENCOUNTER — Encounter (HOSPITAL_BASED_OUTPATIENT_CLINIC_OR_DEPARTMENT_OTHER): Payer: Self-pay | Admitting: Plastic Surgery

## 2017-09-02 ENCOUNTER — Other Ambulatory Visit: Payer: Self-pay | Admitting: Gastroenterology

## 2017-10-08 ENCOUNTER — Encounter (HOSPITAL_COMMUNITY): Payer: Self-pay | Admitting: *Deleted

## 2017-10-08 ENCOUNTER — Other Ambulatory Visit: Payer: Self-pay

## 2017-10-11 ENCOUNTER — Emergency Department (HOSPITAL_COMMUNITY): Payer: No Typology Code available for payment source

## 2017-10-11 ENCOUNTER — Emergency Department (HOSPITAL_COMMUNITY)
Admission: EM | Admit: 2017-10-11 | Discharge: 2017-10-11 | Disposition: A | Discharge status: Home / Self Care | Payer: No Typology Code available for payment source | Attending: Emergency Medicine | Admitting: Emergency Medicine

## 2017-10-11 ENCOUNTER — Other Ambulatory Visit: Payer: Self-pay

## 2017-10-11 ENCOUNTER — Encounter (HOSPITAL_COMMUNITY): Payer: Self-pay

## 2017-10-11 DIAGNOSIS — Z79899 Other long term (current) drug therapy: Secondary | ICD-10-CM | POA: Insufficient documentation

## 2017-10-11 DIAGNOSIS — R2243 Localized swelling, mass and lump, lower limb, bilateral: Secondary | ICD-10-CM | POA: Insufficient documentation

## 2017-10-11 DIAGNOSIS — R609 Edema, unspecified: Secondary | ICD-10-CM

## 2017-10-11 DIAGNOSIS — I10 Essential (primary) hypertension: Secondary | ICD-10-CM | POA: Insufficient documentation

## 2017-10-11 DIAGNOSIS — E039 Hypothyroidism, unspecified: Secondary | ICD-10-CM | POA: Diagnosis not present

## 2017-10-11 DIAGNOSIS — Z87891 Personal history of nicotine dependence: Secondary | ICD-10-CM | POA: Diagnosis not present

## 2017-10-11 DIAGNOSIS — R0602 Shortness of breath: Secondary | ICD-10-CM | POA: Insufficient documentation

## 2017-10-11 LAB — I-STAT TROPONIN, ED: Troponin i, poc: 0 ng/mL (ref 0.00–0.08)

## 2017-10-11 LAB — BASIC METABOLIC PANEL
ANION GAP: 10 (ref 5–15)
BUN: 12 mg/dL (ref 6–20)
CHLORIDE: 107 mmol/L (ref 101–111)
CO2: 24 mmol/L (ref 22–32)
Calcium: 9 mg/dL (ref 8.9–10.3)
Creatinine, Ser: 0.95 mg/dL (ref 0.61–1.24)
GFR calc Af Amer: >60 mL/min (ref >60)
Glucose, Bld: 96 mg/dL (ref 65–99)
Potassium: 4.3 mmol/L (ref 3.5–5.1)
SODIUM: 141 mmol/L (ref 135–145)

## 2017-10-11 LAB — CBC
HEMATOCRIT: 45.7 % (ref 39.0–52.0)
HEMOGLOBIN: 14.7 g/dL (ref 13.0–17.0)
MCH: 28.4 pg (ref 26.0–34.0)
MCHC: 32.2 g/dL (ref 30.0–36.0)
MCV: 88.2 fL (ref 78.0–100.0)
Platelets: 187 10*3/uL (ref 150–400)
RBC: 5.18 MIL/uL (ref 4.22–5.81)
RDW: 13.8 % (ref 11.5–15.5)
WBC: 7.3 10*3/uL (ref 4.0–10.5)

## 2017-10-11 LAB — BRAIN NATRIURETIC PEPTIDE: B NATRIURETIC PEPTIDE 5: 12.6 pg/mL (ref 0.0–100.0)

## 2017-10-11 MED ORDER — TOBRAMYCIN 0.3 % OP SOLN: 1.0000 [drp] | OPHTHALMIC | 0 refills | Status: DC

## 2017-10-11 MED ORDER — FUROSEMIDE 10 MG/ML IJ SOLN
40.0000 mg | Freq: Once | INTRAMUSCULAR | Status: AC
  Administered 2017-10-11: 40 mg via INTRAVENOUS
  Filled 2017-10-11: qty 4

## 2017-10-11 MED ORDER — FUROSEMIDE 20 MG PO TABS: 20.0000 mg | ORAL_TABLET | Freq: Every day | ORAL | 1 refills | Status: DC

## 2017-10-11 NOTE — ED Notes (Signed)
Pt asking about his POC and wants to know if he will be able to go home soon. Will notify Magda Paganini PA to update pt and family

## 2017-10-11 NOTE — ED Notes (Signed)
Magda Paganini PA at the bedside
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
## 2017-10-11 NOTE — ED Notes (Signed)
ED Provider at bedside. 

## 2017-10-11 NOTE — ED Triage Notes (Signed)
Patient sent from [facility] physicians for further evaluation of possible CHF. Has had SOB, weight gain, weakness. Denies CP on arrival. No hx of failure, NAD

## 2017-10-11 NOTE — Discharge Instructions (Signed)
See Dr. Alroy Dust for recheck on Monday.

## 2017-10-11 NOTE — ED Notes (Signed)
Pt verbalized understanding of prescriptions, f/u information, and instructions. Pt ambulatory to lobby with steady gait. All belongings with pt and family.

## 2017-10-15 ENCOUNTER — Other Ambulatory Visit: Payer: Self-pay | Admitting: Gastroenterology

## 2017-10-21 ENCOUNTER — Ambulatory Visit (HOSPITAL_COMMUNITY): Payer: No Typology Code available for payment source | Admitting: Anesthesiology

## 2017-10-21 ENCOUNTER — Ambulatory Visit (HOSPITAL_COMMUNITY)
Admission: RE | Admit: 2017-10-21 | Discharge: 2017-10-21 | Disposition: A | Discharge status: Home / Self Care | Payer: No Typology Code available for payment source | Source: Ambulatory Visit | Attending: Gastroenterology | Admitting: Gastroenterology

## 2017-10-21 ENCOUNTER — Encounter (HOSPITAL_COMMUNITY)
Admission: RE | Disposition: A | Discharge status: Home / Self Care | Payer: Self-pay | Source: Ambulatory Visit | Attending: Gastroenterology

## 2017-10-21 ENCOUNTER — Other Ambulatory Visit: Payer: Self-pay

## 2017-10-21 ENCOUNTER — Encounter (HOSPITAL_COMMUNITY): Payer: Self-pay | Admitting: Emergency Medicine

## 2017-10-21 DIAGNOSIS — Z860100 Personal history of colon polyps, unspecified: Secondary | ICD-10-CM

## 2017-10-21 DIAGNOSIS — Z8719 Personal history of other diseases of the digestive system: Secondary | ICD-10-CM | POA: Diagnosis not present

## 2017-10-21 DIAGNOSIS — K2900 Acute gastritis without bleeding: Secondary | ICD-10-CM | POA: Diagnosis not present

## 2017-10-21 DIAGNOSIS — K5730 Diverticulosis of large intestine without perforation or abscess without bleeding: Secondary | ICD-10-CM | POA: Insufficient documentation

## 2017-10-21 DIAGNOSIS — K621 Rectal polyp: Secondary | ICD-10-CM | POA: Diagnosis not present

## 2017-10-21 DIAGNOSIS — K2270 Barrett's esophagus without dysplasia: Secondary | ICD-10-CM | POA: Diagnosis present

## 2017-10-21 DIAGNOSIS — Z1211 Encounter for screening for malignant neoplasm of colon: Secondary | ICD-10-CM | POA: Insufficient documentation

## 2017-10-21 DIAGNOSIS — D127 Benign neoplasm of rectosigmoid junction: Secondary | ICD-10-CM | POA: Insufficient documentation

## 2017-10-21 DIAGNOSIS — D122 Benign neoplasm of ascending colon: Secondary | ICD-10-CM | POA: Diagnosis not present

## 2017-10-21 DIAGNOSIS — K640 First degree hemorrhoids: Secondary | ICD-10-CM | POA: Diagnosis not present

## 2017-10-21 HISTORY — PX: COLONOSCOPY WITH PROPOFOL: SHX5780

## 2017-10-21 HISTORY — PX: ESOPHAGOGASTRODUODENOSCOPY (EGD) WITH PROPOFOL: SHX5813

## 2017-10-21 SURGERY — COLONOSCOPY WITH PROPOFOL
Anesthesia: Monitor Anesthesia Care

## 2017-10-21 MED ORDER — PROPOFOL 10 MG/ML IV BOLUS
INTRAVENOUS | Status: AC
  Filled 2017-10-21: qty 20

## 2017-10-21 MED ORDER — PROPOFOL 500 MG/50ML IV EMUL
INTRAVENOUS | Status: DC | PRN
  Administered 2017-10-21: 125 ug/kg/min via INTRAVENOUS

## 2017-10-21 MED ORDER — SODIUM CHLORIDE 0.9 % IV SOLN: INTRAVENOUS | Status: DC

## 2017-10-21 MED ORDER — LACTATED RINGERS IV SOLN
INTRAVENOUS | Status: DC
  Administered 2017-10-21: 09:00:00 via INTRAVENOUS

## 2017-10-21 MED ORDER — PROPOFOL 10 MG/ML IV BOLUS
INTRAVENOUS | Status: AC
  Filled 2017-10-21: qty 60

## 2017-10-21 MED ORDER — LIDOCAINE 2% (20 MG/ML) 5 ML SYRINGE
INTRAMUSCULAR | Status: DC | PRN
  Administered 2017-10-21: 60 mg via INTRAVENOUS

## 2017-10-21 MED ORDER — PROPOFOL 10 MG/ML IV BOLUS
INTRAVENOUS | Status: DC | PRN
  Administered 2017-10-21 (×4): 20 mg via INTRAVENOUS

## 2017-10-21 SURGICAL SUPPLY — 24 items

## 2017-10-21 NOTE — H&P (Signed)
Date of Initial H&P: 10/15/17  History reviewed, patient examined, no change in status, stable for surgery.  

## 2017-10-21 NOTE — Discharge Instructions (Signed)

## 2017-10-21 NOTE — Interval H&P Note (Signed)
History and Physical Interval Note:  10/21/2017 10:31 AM  [name]  has presented today for surgery, with the diagnosis of History of colon polyps and barrett's esophagus  The various methods of treatment have been discussed with the patient and family. After consideration of risks, benefits and other options for treatment, the patient has consented to  Procedure(s): COLONOSCOPY WITH PROPOFOL (N/A) ESOPHAGOGASTRODUODENOSCOPY (EGD) WITH PROPOFOL (N/A) as a surgical intervention .  The patient's history has been reviewed, patient examined, no change in status, stable for surgery.  I have reviewed the patient's chart and labs.  Questions were answered to the patient's satisfaction.     Clark[place] C.

## 2017-10-21 NOTE — Anesthesia Preprocedure Evaluation (Signed)
Anesthesia Evaluation  Patient identified by MRN, date of birth, ID band Patient awake    Reviewed: Allergy & Precautions, H&P , NPO status , Patient's Chart, lab work & pertinent test results  Airway Mallampati: II   Neck ROM: full    Dental   Pulmonary sleep apnea , former smoker,    breath sounds clear to auscultation       Cardiovascular hypertension,  Rhythm:regular Rate:Normal     Neuro/Psych PSYCHIATRIC DISORDERS Depression  Neuromuscular disease    GI/Hepatic   Endo/Other  Hypothyroidism   Renal/GU      Musculoskeletal   Abdominal   Peds  Hematology   Anesthesia Other Findings   Reproductive/Obstetrics                             Anesthesia Physical Anesthesia Plan  ASA: III  Anesthesia Plan: MAC   Post-op Pain Management:    Induction: Intravenous  PONV Risk Score and Plan: 1 and Propofol infusion and Treatment may vary due to age or medical condition  Airway Management Planned: Nasal Cannula  Additional Equipment:   Intra-op Plan:   Post-operative Plan:   Informed Consent: I have reviewed the patients History and Physical, chart, labs and discussed the procedure including the risks, benefits and alternatives for the proposed anesthesia with the patient or authorized representative who has indicated his/her understanding and acceptance.     Plan Discussed with: CRNA, Anesthesiologist and Surgeon  Anesthesia Plan Comments:         Anesthesia Quick Evaluation

## 2017-10-21 NOTE — Anesthesia Postprocedure Evaluation (Signed)
Anesthesia Post Note  Patient: [name]  Procedure(s) Performed: COLONOSCOPY WITH PROPOFOL (N/A ) ESOPHAGOGASTRODUODENOSCOPY (EGD) WITH PROPOFOL (N/A )     Patient location during evaluation: PACU Anesthesia Type: MAC Level of consciousness: awake and alert Pain management: pain level controlled Vital Signs Assessment: post-procedure vital signs reviewed and stable Respiratory status: spontaneous breathing, nonlabored ventilation, respiratory function stable and patient connected to nasal cannula oxygen Cardiovascular status: stable and blood pressure returned to baseline Postop Assessment: no apparent nausea or vomiting Anesthetic complications: no    Last Vitals:  Vitals:   10/21/17 1220 10/21/17 1230  BP: (!) 153/84 (!) 155/88  Pulse: 65 67  Resp: 11 17  Temp:    SpO2: 100% 100%    Last Pain:  Vitals:   10/21/17 0854  TempSrc: Oral                 Reise Gladney S

## 2017-10-21 NOTE — Transfer of Care (Signed)
Immediate Anesthesia Transfer of Care Note  Patient: [name]  Procedure(s) Performed: COLONOSCOPY WITH PROPOFOL (N/A ) ESOPHAGOGASTRODUODENOSCOPY (EGD) WITH PROPOFOL (N/A )  Patient Location: Endoscopy Unit  Anesthesia Type:MAC  Level of Consciousness: awake, alert  and oriented  Airway & Oxygen Therapy: Patient Spontanous Breathing and Patient connected to nasal cannula oxygen  Post-op Assessment: Report given to RN and Post -op Vital signs reviewed and stable  Post vital signs: Reviewed and stable  Last Vitals:  Vitals:   10/21/17 0854  BP: (!) 150/67  Pulse: 73  Resp: 16  Temp: 36.5 C  SpO2: 96%    Last Pain:  Vitals:   10/21/17 0854  TempSrc: Oral         Complications: No apparent anesthesia complications

## 2017-10-21 NOTE — Anesthesia Procedure Notes (Signed)
Procedure Name: MAC Date/Time: 10/21/2017 10:53 AM Performed by: Dione Booze, CRNA Pre-anesthesia Checklist: Patient identified, Emergency Drugs available, Suction available and Patient being monitored Patient Re-evaluated:Patient Re-evaluated prior to induction Oxygen Delivery Method: Nasal cannula Placement Confirmation: positive ETCO2

## 2017-10-21 NOTE — Op Note (Signed)
[facility] Patient Name: [name] Procedure Date: 10/21/2017 MRN: [id number] Attending MD: Lear Ng , MD Date of Birth: 07-20-1956 CSN: [id number] Age: 62 Admit Type: Outpatient Procedure:                Colonoscopy Indications:              High risk colon cancer surveillance: Personal                            history of colonic polyps, Last colonoscopy: date                            unknown Providers:                Lear Ng, MD, Carolynn Comment RN, RN,                            Tinnie Gens, Technician, Dione Booze, CRNA Referring MD:             [name] Medicines:                Propofol per Anesthesia, Monitored Anesthesia Care Complications:            No immediate complications. Estimated Blood Loss:     Estimated blood loss was minimal. Procedure:                Pre-Anesthesia Assessment:                           - Prior to the procedure, a History and Physical                            was performed, and patient medications and                            allergies were reviewed. The patient's tolerance of                            previous anesthesia was also reviewed. The risks                            and benefits of the procedure and the sedation                            options and risks were discussed with the patient.                            All questions were answered, and informed consent                            was obtained. Prior Anticoagulants: The patient has                            taken no previous anticoagulant or antiplatelet  agents. ASA Grade Assessment: III - A patient with                            severe systemic disease. After reviewing the risks                            and benefits, the patient was deemed in                            satisfactory condition to undergo the procedure.                           - Prior to the procedure, a History and  Physical                            was performed, and patient medications and                            allergies were reviewed. The patient's tolerance of                            previous anesthesia was also reviewed. The risks                            and benefits of the procedure and the sedation                            options and risks were discussed with the patient.                            All questions were answered, and informed consent                            was obtained. Prior Anticoagulants: The patient has                            taken no previous anticoagulant or antiplatelet                            agents. ASA Grade Assessment: III - A patient with                            severe systemic disease. After reviewing the risks                            and benefits, the patient was deemed in                            satisfactory condition to undergo the procedure.                           After obtaining informed consent, the colonoscope  was passed under direct vision. Throughout the                            procedure, the patient's blood pressure, pulse, and                            oxygen saturations were monitored continuously. The                            [id number] ([id number]) scope was introduced through                            the anus and advanced to the the cecum, identified                            by appendiceal orifice and ileocecal valve. The                            colonoscopy was extremely difficult due to                            significant looping, a tortuous colon and fair                            prep. Successful completion of the procedure was                            aided by changing the patient to a supine position,                            straightening and shortening the scope to obtain                            bowel loop reduction, applying abdominal pressure,                             lavage and receiving assistance from additional                            staff. The patient tolerated the procedure fairly                            well. The quality of the bowel preparation was fair                            and fair but repeated irrigation led to a good and                            adequate prep. The ileocecal valve, appendiceal                            orifice, and rectum were photographed. Scope In: 11:15:58 AM Scope Out: 11:53:27 AM Scope Withdrawal Time: 0 hours  18 minutes 43 seconds  Total Procedure Duration: 0 hours 37 minutes 29 seconds  Findings:      The perianal and digital rectal examinations were normal.      Scattered small and large-mouthed diverticula were found in the sigmoid       colon and descending colon.      A 2 mm polyp was found in the ascending colon. The polyp was       semi-sessile. The polyp was removed with a cold biopsy forceps.       Resection and retrieval were complete. Estimated blood loss was minimal.      Two sessile and semi-sessile polyps were found in the rectum and sigmoid       colon. The polyps were 3 to 5 mm in size. These polyps were removed with       a hot snare. Resection and retrieval were complete. Estimated blood       loss: none.      Four flat and semi-sessile polyps were found in the rectum and sigmoid       colon. The polyps were 1 to 4 mm in size. These polyps were removed with       a cold biopsy forceps. Resection and retrieval were complete. Estimated       blood loss was minimal.      Internal hemorrhoids were found during retroflexion. The hemorrhoids       were small and Grade I (internal hemorrhoids that do not prolapse). Impression:               - Preparation of the colon was fair.                           - Diverticulosis in the sigmoid colon and in the                            descending colon.                           - One 2 mm polyp in the ascending colon, removed                             with a cold biopsy forceps. Resected and retrieved.                           - Two 3 to 5 mm polyps in the rectum and in the                            sigmoid colon, removed with a hot snare. Resected                            and retrieved.                           - Four 1 to 4 mm polyps in the rectum and in the                            sigmoid colon, removed with a cold biopsy forceps.  Resected and retrieved.                           - Internal hemorrhoids. Moderate Sedation:      N/A- Per Anesthesia Care Recommendation:           - Await pathology results.                           - High fiber diet.                           - Repeat colonoscopy for surveillance based on                            pathology results.                           - Patient has a contact number available for                            emergencies. The signs and symptoms of potential                            delayed complications were discussed with the                            patient. Return to normal activities tomorrow.                            Written discharge instructions were provided to the                            patient.                           - No aspirin, ibuprofen, naproxen, or other                            non-steroidal anti-inflammatory drugs for 1 week. Procedure Code(s):        --- Professional ---                           [phone], Colonoscopy, flexible; with removal of                            tumor(s), polyp(s), or other lesion(s) by snare                            technique                           61443, 93, Colonoscopy, flexible; with biopsy,                            single or multiple Diagnosis Code(s):        --- Professional ---  Z86.010, Personal history of colonic polyps                           D12.2, Benign neoplasm of ascending colon                           K62.1, Rectal polyp                            D12.5, Benign neoplasm of sigmoid colon                           K64.0, First degree hemorrhoids                           K57.30, Diverticulosis of large intestine without                            perforation or abscess without bleeding CPT copyright 2016 American [employer]. All rights reserved. The codes documented in this report are preliminary and upon coder review may  be revised to meet current compliance requirements. Lear Ng, MD 10/21/2017 12:07:37 PM This report has been signed electronically. Number of Addenda: 0

## 2017-10-21 NOTE — Op Note (Signed)
[facility] Patient Name: [name] Procedure Date: 10/21/2017 MRN: [id number] Attending MD: Lear Ng , MD Date of Birth: 08-Sep-1955 CSN: [id number] Age: 62 Admit Type: Outpatient Procedure:                Upper GI endoscopy Indications:              Barrett's esophagus Providers:                Lear Ng, MD, Carolynn Comment RN, RN,                            Tinnie Gens, Technician, Dione Booze, CRNA Referring MD:             [name] Medicines:                Propofol per Anesthesia, Monitored Anesthesia Care Complications:            No immediate complications. Estimated Blood Loss:     Estimated blood loss: none. Procedure:                Pre-Anesthesia Assessment:                           - Prior to the procedure, a History and Physical                            was performed, and patient medications and                            allergies were reviewed. The patient's tolerance of                            previous anesthesia was also reviewed. The risks                            and benefits of the procedure and the sedation                            options and risks were discussed with the patient.                            All questions were answered, and informed consent                            was obtained. Prior Anticoagulants: The patient has                            taken no previous anticoagulant or antiplatelet                            agents. ASA Grade Assessment: III - A patient with                            severe systemic disease. After reviewing the risks  and benefits, the patient was deemed in                            satisfactory condition to undergo the procedure.                           After obtaining informed consent, the endoscope was                            passed under direct vision. Throughout the                            procedure, the patient's blood  pressure, pulse, and                            oxygen saturations were monitored continuously. The                            ([id number]) [id number] was introduced through the                            mouth, and advanced to the second part of duodenum.                            The upper GI endoscopy was accomplished without                            difficulty. The patient tolerated the procedure                            well. Scope In: Scope Out: Findings:      The examined esophagus was normal.      The Z-line was regular and was found 46 cm from the incisors.      Segmental minimal inflammation characterized by congestion (edema) and       erythema was found in the gastric antrum.      The exam of the stomach was otherwise normal.      The examined duodenum was normal. Impression:               - Normal esophagus.                           - Z-line regular, 46 cm from the incisors.                           - Acute gastritis.                           - Normal examined duodenum.                           - No specimens collected. Moderate Sedation:      N/A- Per Anesthesia Care Recommendation:           - Follow an antireflux regimen.                           -  Post procedure medication orders were given. Procedure Code(s):        --- Professional ---                           [phone], Esophagogastroduodenoscopy, flexible,                            transoral; diagnostic, including collection of                            specimen(s) by brushing or washing, when performed                            (separate procedure) Diagnosis Code(s):        --- Professional ---                           K22.70, Barrett's esophagus without dysplasia                           K29.00, Acute gastritis without bleeding CPT copyright 2016 American Medical Association. All rights reserved. The codes documented in this report are preliminary and upon coder review may  be revised to meet current  compliance requirements. Lear Ng, MD 10/21/2017 12:01:04 PM This report has been signed electronically. Number of Addenda: 0

## 2017-10-22 ENCOUNTER — Encounter (HOSPITAL_COMMUNITY): Payer: Self-pay | Admitting: Gastroenterology

## 2017-12-03 DIAGNOSIS — H903 Sensorineural hearing loss, bilateral: Secondary | ICD-10-CM | POA: Insufficient documentation

## 2017-12-03 DIAGNOSIS — H9313 Tinnitus, bilateral: Secondary | ICD-10-CM | POA: Insufficient documentation

## 2017-12-03 DIAGNOSIS — H6121 Impacted cerumen, right ear: Secondary | ICD-10-CM | POA: Insufficient documentation

## 2019-05-24 LAB — TSH: TSH: 3.6 (ref 0.41–5.90)

## 2019-05-24 LAB — LIPID PANEL
Cholesterol: 101 (ref 0–200)
HDL: 26 — AB (ref 35–70)
LDL Cholesterol: 57
LDl/HDL Ratio: 3.9
Triglycerides: 89 (ref 40–160)

## 2019-05-24 LAB — PSA: PSA: 4.34
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
## 2019-09-14 ENCOUNTER — Ambulatory Visit: Payer: Managed Care, Other (non HMO) | Admitting: Cardiology
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
## 2019-09-28 ENCOUNTER — Telehealth: Payer: Self-pay | Admitting: Cardiology

## 2019-09-28 NOTE — Telephone Encounter (Signed)
Pt updated with visitor's policy and made aware that wife could be conference in. Pt verbalized understanding.

## 2019-09-28 NOTE — Telephone Encounter (Signed)
Patient is requesting his wife to come with him to his appt tomorrow with Dr. Stanford Breed. His wife is also a patient of Dr. Stanford Breed. I advised him that we are asking that patients come alone unless for a medical reason, he stated that he did not feel good about this appointment coming up and would like her support.

## 2019-09-29 ENCOUNTER — Encounter: Payer: Self-pay | Admitting: Cardiology

## 2019-09-29 ENCOUNTER — Other Ambulatory Visit: Payer: Self-pay

## 2019-09-29 ENCOUNTER — Encounter: Payer: Self-pay | Admitting: *Deleted

## 2019-09-29 ENCOUNTER — Ambulatory Visit: Payer: Managed Care, Other (non HMO) | Admitting: Cardiology

## 2019-09-29 VITALS — BP 140/80 | HR 64 | Ht 71.0 in | Wt 311.6 lb

## 2019-09-29 DIAGNOSIS — R0600 Dyspnea, unspecified: Secondary | ICD-10-CM

## 2019-09-29 DIAGNOSIS — I10 Essential (primary) hypertension: Secondary | ICD-10-CM

## 2019-09-29 DIAGNOSIS — R0609 Other forms of dyspnea: Secondary | ICD-10-CM

## 2019-09-29 DIAGNOSIS — R072 Precordial pain: Secondary | ICD-10-CM | POA: Diagnosis not present

## 2019-09-29 LAB — BASIC METABOLIC PANEL
BUN/Creatinine Ratio: 17 (ref 10–24)
BUN: 18 mg/dL (ref 8–27)
CO2: 22 mmol/L (ref 20–29)
Calcium: 8.8 mg/dL (ref 8.6–10.2)
Chloride: 105 mmol/L (ref 96–106)
Creatinine, Ser: 1.04 mg/dL (ref 0.76–1.27)
GFR calc Af Amer: 88 mL/min/{1.73_m2} (ref >59)
GFR calc non Af Amer: 76 mL/min/{1.73_m2} (ref >59)
Glucose: 85 mg/dL (ref 65–99)
Potassium: 4.3 mmol/L (ref 3.5–5.2)
Sodium: 142 mmol/L (ref 134–144)

## 2019-09-29 LAB — CBC
Hematocrit: 43.9 % (ref 37.5–51.0)
Hemoglobin: 14.6 g/dL (ref 13.0–17.7)
MCH: 28.4 pg (ref 26.6–33.0)
MCHC: 33.3 g/dL (ref 31.5–35.7)
MCV: 85 fL (ref 79–97)
Platelets: 223 10*3/uL (ref 150–450)
RBC: 5.14 x10E6/uL (ref 4.14–5.80)
RDW: 13.3 % (ref 11.6–15.4)
WBC: 6.9 10*3/uL (ref 3.4–10.8)

## 2019-09-29 MED ORDER — METOPROLOL SUCCINATE ER 25 MG PO TB24: 25.0000 mg | ORAL_TABLET | Freq: Every day | ORAL | 3 refills | Status: DC

## 2019-09-29 MED ORDER — SODIUM CHLORIDE 0.9% FLUSH: 3.0000 mL | Freq: Two times a day (BID) | INTRAVENOUS | Status: DC

## 2019-09-29 MED ORDER — ASPIRIN EC 81 MG PO TBEC: 81.0000 mg | DELAYED_RELEASE_TABLET | Freq: Every day | ORAL | 3 refills | Status: DC

## 2019-09-29 NOTE — Patient Instructions (Signed)
Medication Instructions:  START ASPIRIN 81 MG ONCE DAILY  START METOPROLOL SUCC ER 25 MG ONCE DAILY AT BEDTIME  You are scheduled for a Cardiac Catheterization on Wednesday, February 10 with Dr. [name].  1. Please arrive at the [facility] (Main Entrance A) at [facility]: [address] [place], [place] [address] at 6:30 AM (This time is two hours before your procedure to ensure your preparation). Free valet parking service is available.   Special note: Every effort is made to have your procedure done on time. Please understand that emergencies sometimes delay scheduled procedures.  2. Diet: Do not eat solid foods after midnight.  The patient may have clear liquids until 5am upon the day of the procedure.  3. Labs: You will need to have blood drawn on TODAY  4. Medication instructions in preparation for your procedure:  DO NOT TAKE FUROSEMIDE THE MORNING OF THE PROCEDURE  On the morning of your procedure, take your ASPIRIN and any morning medicines NOT listed above.  You may use sips of water.  GO TO [place] [facility] Saturday 10/02/2019 @ 11:25 AM  5. Plan for one night stay--bring personal belongings. 6. Bring a current list of your medications and current insurance cards. 7. You MUST have a responsible person to drive you home. 8. Someone MUST be with you the first 24 hours after you arrive home or your discharge will be delayed. 9. Please wear clothes that are easy to get on and off and wear slip-on shoes.  Thank you for allowing [place] to care for you!   -- [facility]   Your physician recommends that you schedule a follow-up appointment in: [place]

## 2019-09-29 NOTE — Addendum Note (Signed)
Addended by: Cristopher Estimable on: 09/29/2019 09:30 AM   Modules accepted: Orders, SmartSet

## 2019-09-30 ENCOUNTER — Encounter: Payer: Self-pay | Admitting: *Deleted

## 2019-10-02 ENCOUNTER — Other Ambulatory Visit (HOSPITAL_COMMUNITY)
Admission: RE | Admit: 2019-10-02 | Discharge: 2019-10-02 | Disposition: A | Discharge status: Home / Self Care | Payer: Managed Care, Other (non HMO) | Source: Ambulatory Visit | Attending: Cardiovascular Disease | Admitting: Cardiovascular Disease

## 2019-10-02 DIAGNOSIS — Z01812 Encounter for preprocedural laboratory examination: Secondary | ICD-10-CM | POA: Insufficient documentation

## 2019-10-02 DIAGNOSIS — Z20822 Contact with and (suspected) exposure to covid-19: Secondary | ICD-10-CM | POA: Insufficient documentation

## 2019-10-02 LAB — SARS CORONAVIRUS 2 (TAT 6-24 HRS): SARS Coronavirus 2: NEGATIVE

## 2019-10-04 ENCOUNTER — Telehealth: Payer: Self-pay | Admitting: *Deleted

## 2019-10-04 NOTE — Telephone Encounter (Signed)
Pt contacted pre-catheterization scheduled at [facility] for: Wednesday October 06, 2019 8:30 AM Verified arrival time and place: [place] [facility]) at: 6:30 AM   No solid food after midnight prior to cath, clear liquids until 5 AM day of procedure. Contrast allergy: no  Hold: Lasix-AM of procedure   Except hold medications AM meds can be  taken pre-cath with sip of water including: ASA 81 mg   Confirmed patient has responsible adult to drive home post procedure and observe 24 hours after arriving home: yes  Currently, due to Covid-19 pandemic, only one person will be allowed with patient. Must be the same person for patient's entire stay and will be required to wear a mask. They will be asked to wait in the waiting room for the duration of the patient's stay.  Patients are required to wear a mask when they enter the hospital.      COVID-19 Pre-Screening Questions:  . In the past 7 to 10 days have you had a cough,  shortness of breath, headache, congestion, fever (100 or greater) body aches, chills, sore throat, or sudden loss of taste or sense of smell? no . Have you been around anyone with known Covid 19 in past 7-10 days? no . Have you been around anyone who is awaiting Covid 19 test results in the past 7 to 10 days? no . Have you been around anyone who has been exposed to Covid 19, or has mentioned symptoms of Covid 19 within the past 7 to 10 days? no   I reviewed procedure/mask/visitor instructions, Covid-19 screening questions with patient, he verbalized understanding, thanked me for call.

## 2019-10-06 ENCOUNTER — Encounter (HOSPITAL_COMMUNITY)
Admission: RE | Disposition: A | Discharge status: Home / Self Care | Payer: Self-pay | Source: Home / Self Care | Attending: Cardiovascular Disease

## 2019-10-06 ENCOUNTER — Other Ambulatory Visit: Payer: Self-pay

## 2019-10-06 ENCOUNTER — Ambulatory Visit (HOSPITAL_COMMUNITY)
Admission: RE | Admit: 2019-10-06 | Discharge: 2019-10-06 | Disposition: A | Discharge status: Home / Self Care | Payer: Managed Care, Other (non HMO) | Attending: Cardiovascular Disease | Admitting: Cardiovascular Disease

## 2019-10-06 DIAGNOSIS — F329 Major depressive disorder, single episode, unspecified: Secondary | ICD-10-CM | POA: Diagnosis not present

## 2019-10-06 DIAGNOSIS — Z8249 Family history of ischemic heart disease and other diseases of the circulatory system: Secondary | ICD-10-CM | POA: Diagnosis not present

## 2019-10-06 DIAGNOSIS — G4730 Sleep apnea, unspecified: Secondary | ICD-10-CM | POA: Insufficient documentation

## 2019-10-06 DIAGNOSIS — I209 Angina pectoris, unspecified: Secondary | ICD-10-CM | POA: Diagnosis present

## 2019-10-06 DIAGNOSIS — Z87891 Personal history of nicotine dependence: Secondary | ICD-10-CM | POA: Insufficient documentation

## 2019-10-06 DIAGNOSIS — I25119 Atherosclerotic heart disease of native coronary artery with unspecified angina pectoris: Secondary | ICD-10-CM | POA: Diagnosis not present

## 2019-10-06 DIAGNOSIS — G709 Myoneural disorder, unspecified: Secondary | ICD-10-CM | POA: Insufficient documentation

## 2019-10-06 DIAGNOSIS — Z880 Allergy status to penicillin: Secondary | ICD-10-CM | POA: Insufficient documentation

## 2019-10-06 DIAGNOSIS — R0600 Dyspnea, unspecified: Secondary | ICD-10-CM | POA: Insufficient documentation

## 2019-10-06 DIAGNOSIS — I25118 Atherosclerotic heart disease of native coronary artery with other forms of angina pectoris: Secondary | ICD-10-CM | POA: Diagnosis present

## 2019-10-06 DIAGNOSIS — I10 Essential (primary) hypertension: Secondary | ICD-10-CM | POA: Insufficient documentation

## 2019-10-06 DIAGNOSIS — Z79890 Hormone replacement therapy (postmenopausal): Secondary | ICD-10-CM | POA: Diagnosis not present

## 2019-10-06 DIAGNOSIS — E669 Obesity, unspecified: Secondary | ICD-10-CM | POA: Diagnosis not present

## 2019-10-06 DIAGNOSIS — Z6841 Body Mass Index (BMI) 40.0 and over, adult: Secondary | ICD-10-CM | POA: Diagnosis not present

## 2019-10-06 DIAGNOSIS — Z79899 Other long term (current) drug therapy: Secondary | ICD-10-CM | POA: Insufficient documentation

## 2019-10-06 DIAGNOSIS — R0609 Other forms of dyspnea: Secondary | ICD-10-CM

## 2019-10-06 DIAGNOSIS — E039 Hypothyroidism, unspecified: Secondary | ICD-10-CM | POA: Insufficient documentation

## 2019-10-06 DIAGNOSIS — I208 Other forms of angina pectoris: Secondary | ICD-10-CM | POA: Diagnosis present

## 2019-10-06 DIAGNOSIS — R072 Precordial pain: Secondary | ICD-10-CM

## 2019-10-06 HISTORY — PX: LEFT HEART CATH AND CORONARY ANGIOGRAPHY: CATH118249

## 2019-10-06 SURGERY — LEFT HEART CATH AND CORONARY ANGIOGRAPHY
Anesthesia: LOCAL

## 2019-10-06 MED ORDER — SODIUM CHLORIDE 0.9 % IV SOLN: 250.0000 mL | INTRAVENOUS | Status: DC | PRN

## 2019-10-06 MED ORDER — MIDAZOLAM HCL 2 MG/2ML IJ SOLN
INTRAMUSCULAR | Status: DC | PRN
  Administered 2019-10-06: 1 mg via INTRAVENOUS
  Administered 2019-10-06: 2 mg via INTRAVENOUS

## 2019-10-06 MED ORDER — LABETALOL HCL 5 MG/ML IV SOLN: 10.0000 mg | INTRAVENOUS | Status: DC | PRN

## 2019-10-06 MED ORDER — HEPARIN (PORCINE) IN NACL 1000-0.9 UT/500ML-% IV SOLN
INTRAVENOUS | Status: DC | PRN
  Administered 2019-10-06 (×2): 500 mL

## 2019-10-06 MED ORDER — HYDRALAZINE HCL 20 MG/ML IJ SOLN: 10.0000 mg | INTRAMUSCULAR | Status: DC | PRN

## 2019-10-06 MED ORDER — SODIUM CHLORIDE 0.9 % WEIGHT BASED INFUSION: 1.0000 mL/kg/h | INTRAVENOUS | Status: DC

## 2019-10-06 MED ORDER — MIDAZOLAM HCL 2 MG/2ML IJ SOLN
INTRAMUSCULAR | Status: AC
  Filled 2019-10-06: qty 2

## 2019-10-06 MED ORDER — ONDANSETRON HCL 4 MG/2ML IJ SOLN: 4.0000 mg | Freq: Four times a day (QID) | INTRAMUSCULAR | Status: DC | PRN

## 2019-10-06 MED ORDER — FENTANYL CITRATE (PF) 100 MCG/2ML IJ SOLN
INTRAMUSCULAR | Status: AC
  Filled 2019-10-06: qty 2

## 2019-10-06 MED ORDER — FENTANYL CITRATE (PF) 100 MCG/2ML IJ SOLN
INTRAMUSCULAR | Status: DC | PRN
  Administered 2019-10-06 (×2): 25 ug via INTRAVENOUS

## 2019-10-06 MED ORDER — HEPARIN (PORCINE) IN NACL 1000-0.9 UT/500ML-% IV SOLN
INTRAVENOUS | Status: AC
  Filled 2019-10-06: qty 1000

## 2019-10-06 MED ORDER — IOHEXOL 350 MG/ML SOLN
INTRAVENOUS | Status: DC | PRN
  Administered 2019-10-06: 09:00:00 85 mL via INTRA_ARTERIAL

## 2019-10-06 MED ORDER — SODIUM CHLORIDE 0.9% FLUSH: 3.0000 mL | INTRAVENOUS | Status: DC | PRN

## 2019-10-06 MED ORDER — VERAPAMIL HCL 2.5 MG/ML IV SOLN
INTRAVENOUS | Status: AC
  Filled 2019-10-06: qty 2

## 2019-10-06 MED ORDER — VERAPAMIL HCL 2.5 MG/ML IV SOLN
INTRAVENOUS | Status: DC | PRN
  Administered 2019-10-06: 10 mL via INTRA_ARTERIAL

## 2019-10-06 MED ORDER — ACETAMINOPHEN 325 MG PO TABS: 650.0000 mg | ORAL_TABLET | ORAL | Status: DC | PRN

## 2019-10-06 MED ORDER — HEPARIN SODIUM (PORCINE) 1000 UNIT/ML IJ SOLN
INTRAMUSCULAR | Status: DC | PRN
  Administered 2019-10-06: 7000 [IU] via INTRAVENOUS

## 2019-10-06 MED ORDER — HEPARIN SODIUM (PORCINE) 1000 UNIT/ML IJ SOLN
INTRAMUSCULAR | Status: AC
  Filled 2019-10-06: qty 1

## 2019-10-06 MED ORDER — LIDOCAINE HCL (PF) 1 % IJ SOLN
INTRAMUSCULAR | Status: DC | PRN
  Administered 2019-10-06: 2 mL via SUBCUTANEOUS

## 2019-10-06 MED ORDER — SODIUM CHLORIDE 0.9% FLUSH: 3.0000 mL | Freq: Two times a day (BID) | INTRAVENOUS | Status: DC

## 2019-10-06 MED ORDER — ASPIRIN 81 MG PO CHEW: 81.0000 mg | CHEWABLE_TABLET | ORAL | Status: DC

## 2019-10-06 MED ORDER — LIDOCAINE HCL (PF) 1 % IJ SOLN
INTRAMUSCULAR | Status: AC
  Filled 2019-10-06: qty 30

## 2019-10-06 MED ORDER — SODIUM CHLORIDE 0.9 % WEIGHT BASED INFUSION
3.0000 mL/kg/h | INTRAVENOUS | Status: AC
  Administered 2019-10-06: 06:00:00 3 mL/kg/h via INTRAVENOUS

## 2019-10-06 SURGICAL SUPPLY — 14 items
CATH EXPO 5F MPA-1 (CATHETERS) ×2 IMPLANT
CATH IMPULSE 5F ANG/FL3.5 (CATHETERS) ×2 IMPLANT
CATH INFINITI 5 FR AL2 (CATHETERS) ×2 IMPLANT
CATH LAUNCHER 5F EBU3.0 (CATHETERS) ×1 IMPLANT
CATH LAUNCHER 6FR EBU3.5 (CATHETERS) ×2 IMPLANT
CATHETER LAUNCHER 5F EBU3.0 (CATHETERS) ×2
DEVICE RAD COMP TR BAND LRG (VASCULAR PRODUCTS) ×2 IMPLANT
GLIDESHEATH SLEND SS 6F .021 (SHEATH) ×2 IMPLANT
GUIDEWIRE INQWIRE 1.5J.035X260 (WIRE) ×1 IMPLANT
INQWIRE 1.5J .035X260CM (WIRE) ×2
KIT HEART LEFT (KITS) ×2 IMPLANT
PACK CARDIAC CATHETERIZATION (CUSTOM PROCEDURE TRAY) ×2 IMPLANT
TRANSDUCER W/STOPCOCK (MISCELLANEOUS) ×2 IMPLANT
TUBING CIL FLEX 10 FLL-RA (TUBING) ×2 IMPLANT

## 2019-10-06 NOTE — Interval H&P Note (Signed)
Cath Lab Visit (complete for each Cath Lab visit)  Clinical Evaluation Leading to the Procedure:   ACS: No.  Non-ACS:    Anginal Classification: CCS III  Anti-ischemic medical therapy: No Therapy  Non-Invasive Test Results: No non-invasive testing performed  Prior CABG: No previous CABG  History and Physical Interval Note:  10/06/2019 8:14 AM  [name]  has presented today for surgery, with the diagnosis of chest pain.  The various methods of treatment have been discussed with the patient and family. After consideration of risks, benefits and other options for treatment, the patient has consented to  Procedure(s): LEFT HEART CATH AND CORONARY ANGIOGRAPHY (N/A) as a surgical intervention.  The patient's history has been reviewed, patient examined, no change in status, stable for surgery.  I have reviewed the patient's chart and labs.  Questions were answered to the patient's satisfaction.     [name]

## 2019-10-06 NOTE — Discharge Instructions (Signed)
Radial Site Care  This sheet gives you information about how to care for yourself after your procedure. Your health care provider may also give you more specific instructions. If you have problems or questions, contact your health care provider. What can I expect after the procedure? After the procedure, it is common to have:  Bruising and tenderness at the catheter insertion area. Follow these instructions at home: Medicines  Take over-the-counter and prescription medicines only as told by your health care provider. Insertion site care  Follow instructions from your health care provider about how to take care of your insertion site. Make sure you: ? Wash your hands with soap and water before you change your bandage (dressing). If soap and water are not available, use hand sanitizer. ? Change your dressing as told by your health care provider. ? Leave stitches (sutures), skin glue, or adhesive strips in place. These skin closures may need to stay in place for 2 weeks or longer. If adhesive strip edges start to loosen and curl up, you may trim the loose edges. Do not remove adhesive strips completely unless your health care provider tells you to do that.  Check your insertion site every day for signs of infection. Check for: ? Redness, swelling, or pain. ? Fluid or blood. ? Pus or a bad smell. ? Warmth.  Do not take baths, swim, or use a hot tub until your health care provider approves.  You may shower 24-48 hours after the procedure, or as directed by your health care provider. ? Remove the dressing and gently wash the site with plain soap and water. ? Pat the area dry with a clean towel. ? Do not rub the site. That could cause bleeding.  Do not apply powder or lotion to the site. Activity   For 24 hours after the procedure, or as directed by your health care provider: ? Do not flex or bend the affected arm. ? Do not push or pull heavy objects with the affected arm. ? Do not  drive yourself home from the hospital or clinic. You may drive 24 hours after the procedure unless your health care provider tells you not to. ? Do not operate machinery or power tools.  Do not lift anything that is heavier than 10 lb (4.5 kg), or the limit that you are told, until your health care provider says that it is safe.  Ask your health care provider when it is okay to: ? Return to work or school. ? Resume usual physical activities or sports. ? Resume sexual activity. General instructions  If the catheter site starts to bleed, raise your arm and put firm pressure on the site. If the bleeding does not stop, get help right away. This is a medical emergency.  If you went home on the same day as your procedure, a responsible adult should be with you for the first 24 hours after you arrive home.  Keep all follow-up visits as told by your health care provider. This is important. Contact a health care provider if:  You have a fever.  You have redness, swelling, or yellow drainage around your insertion site. Get help right away if:  You have unusual pain at the radial site.  The catheter insertion area swells very fast.  The insertion area is bleeding, and the bleeding does not stop when you hold steady pressure on the area.  Your arm or hand becomes pale, cool, tingly, or numb. These symptoms may represent a serious problem   that is an emergency. Do not wait to see if the symptoms will go away. Get medical help right away. Call your local emergency services (911 in the [place] Do not drive yourself to the hospital. Summary  After the procedure, it is common to have bruising and tenderness at the site.  Follow instructions from your health care provider about how to take care of your radial site wound. Check the wound every day for signs of infection.  Do not lift anything that is heavier than 10 lb (4.5 kg), or the limit that you are told, until your health care provider says  that it is safe. This information is not intended to replace advice given to you by your health care provider. Make sure you discuss any questions you have with your health care provider. Document Revised: 09/17/2017 Document Reviewed: 09/17/2017 Elsevier Patient Education  2020 [employer]  

## 2019-10-08 ENCOUNTER — Other Ambulatory Visit: Payer: Self-pay | Admitting: Cardiology

## 2019-10-08 MED ORDER — FUROSEMIDE 20 MG PO TABS: 20.0000 mg | ORAL_TABLET | Freq: Every day | ORAL | 1 refills | Status: DC

## 2019-10-08 NOTE — Telephone Encounter (Signed)
Let patient know that I refilled his Furosemide and sent a 90 day supply with additional refills to [facility].

## 2019-10-08 NOTE — Telephone Encounter (Signed)
*  STAT* If patient is at the pharmacy, call can be transferred to refill team.   1. Which medications need to be refilled? (please list name of each medication and dose if known) furosemide (LASIX) 20 MG tablet  2. Which pharmacy/location (including street and city if local pharmacy) is medication to be sent to? [facility]  3. Do they need a 30 day or 90 day supply? 90 day supply

## 2019-10-11 ENCOUNTER — Other Ambulatory Visit: Payer: Self-pay

## 2019-10-13 ENCOUNTER — Other Ambulatory Visit: Payer: Self-pay

## 2019-10-20 ENCOUNTER — Other Ambulatory Visit: Payer: Self-pay | Admitting: Cardiology

## 2019-10-20 MED ORDER — METOPROLOL SUCCINATE ER 25 MG PO TB24: 25.0000 mg | ORAL_TABLET | Freq: Every day | ORAL | 3 refills | Status: DC

## 2019-10-20 NOTE — Telephone Encounter (Signed)
Metoprolol succinate refilled.
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
## 2019-11-16 ENCOUNTER — Telehealth (INDEPENDENT_AMBULATORY_CARE_PROVIDER_SITE_OTHER): Payer: Managed Care, Other (non HMO) | Admitting: Cardiology

## 2019-11-16 ENCOUNTER — Encounter: Payer: Self-pay | Admitting: Cardiology

## 2019-11-16 ENCOUNTER — Telehealth: Payer: Self-pay | Admitting: *Deleted

## 2019-11-16 ENCOUNTER — Other Ambulatory Visit: Payer: Self-pay | Admitting: *Deleted

## 2019-11-16 VITALS — BP 141/75 | HR 58 | Ht 71.0 in | Wt 301.0 lb

## 2019-11-16 DIAGNOSIS — I10 Essential (primary) hypertension: Secondary | ICD-10-CM

## 2019-11-16 DIAGNOSIS — I2510 Atherosclerotic heart disease of native coronary artery without angina pectoris: Secondary | ICD-10-CM | POA: Diagnosis not present

## 2019-11-16 DIAGNOSIS — R072 Precordial pain: Secondary | ICD-10-CM

## 2019-11-16 DIAGNOSIS — R0609 Other forms of dyspnea: Secondary | ICD-10-CM

## 2019-11-16 DIAGNOSIS — R0600 Dyspnea, unspecified: Secondary | ICD-10-CM

## 2019-11-16 MED ORDER — AMLODIPINE BESYLATE 5 MG PO TABS: 5.0000 mg | ORAL_TABLET | Freq: Every day | ORAL | 3 refills | Status: DC

## 2019-11-16 MED ORDER — ATORVASTATIN CALCIUM 40 MG PO TABS: 40.0000 mg | ORAL_TABLET | Freq: Every day | ORAL | 3 refills | Status: DC

## 2019-11-16 NOTE — Telephone Encounter (Signed)
RN spoke to patient. Instruction were given  from today's virtual visit 11/16/19 .  AVS SUMMARY has been sent by mychart  And mailed with labslip .   Patient verbalized understanding

## 2019-11-16 NOTE — Patient Instructions (Signed)
Medication Instructions:   Start Atorvastatin 80 mg one tablet dail    Start Amlodipine  5 mg one tablet daily  *If you need a refill on your cardiac medications before your next appointment, please call your pharmacy*   Lab Work: D-Dimer today  Lipid Hepatic- 12 weeks   If you have labs (blood work) drawn today and your tests are completely normal, you will receive your results only by: Marland Kitchen MyChart Message (if you have MyChart) OR . A paper copy in the mail If you have any lab test that is abnormal or we need to change your treatment, we will call you to review the results.   Testing/Procedures:  Not needed   Follow-Up: At [facility], you and your health needs are our priority.  As part of our continuing mission to provide you with exceptional heart care, we have created designated Provider Care Teams.  These Care Teams include your primary Cardiologist (physician) and Advanced Practice Providers (APPs -  Physician Assistants and Nurse Practitioners) who all work together to provide you with the care you need, when you need it.  We recommend signing up for the patient portal called "MyChart".  Sign up information is provided on this After Visit Summary.  MyChart is used to connect with patients for Virtual Visits (Telemedicine).  Patients are able to view lab/test results, encounter notes, upcoming appointments, etc.  Non-urgent messages can be sent to your provider as well.   To learn more about what you can do with MyChart, go to [email or web address].    Your next appointment:   4 month(s)  The format for your next appointment:   In Person  Provider:   [name], MD   Other Instructions

## 2019-11-18 LAB — LIPID PANEL
Chol/HDL Ratio: 4 ratio (ref 0.0–5.0)
Cholesterol, Total: 99 mg/dL — ABNORMAL LOW (ref 100–199)
HDL: 25 mg/dL — ABNORMAL LOW (ref >39)
LDL Chol Calc (NIH): 53 mg/dL (ref 0–99)
Triglycerides: 110 mg/dL (ref 0–149)
VLDL Cholesterol Cal: 21 mg/dL (ref 5–40)

## 2019-11-18 LAB — HEPATIC FUNCTION PANEL
ALT: 11 IU/L (ref 0–44)
AST: 13 IU/L (ref 0–40)
Albumin: 4.4 g/dL (ref 3.8–4.8)
Alkaline Phosphatase: 107 IU/L (ref 39–117)
Bilirubin Total: <0.2 mg/dL (ref 0.0–1.2)
Bilirubin, Direct: 0.08 mg/dL (ref 0.00–0.40)
Total Protein: 7 g/dL (ref 6.0–8.5)

## 2019-11-18 LAB — D-DIMER, QUANTITATIVE: D-DIMER: 0.3 mg/L FEU (ref 0.00–0.49)

## 2019-12-09 ENCOUNTER — Other Ambulatory Visit: Payer: Self-pay

## 2019-12-09 MED ORDER — AMLODIPINE BESYLATE 5 MG PO TABS: 5.0000 mg | ORAL_TABLET | Freq: Every day | ORAL | 3 refills | Status: DC

## 2019-12-09 MED ORDER — ATORVASTATIN CALCIUM 40 MG PO TABS: 40.0000 mg | ORAL_TABLET | Freq: Every day | ORAL | 3 refills | Status: DC

## 2020-01-12 ENCOUNTER — Other Ambulatory Visit: Payer: Self-pay

## 2020-01-12 ENCOUNTER — Encounter: Payer: Self-pay | Admitting: Urology

## 2020-01-12 ENCOUNTER — Ambulatory Visit: Payer: Managed Care, Other (non HMO) | Admitting: Urology

## 2020-01-12 VITALS — BP 138/79 | HR 63 | Ht 71.0 in | Wt 314.0 lb

## 2020-01-12 DIAGNOSIS — N529 Male erectile dysfunction, unspecified: Secondary | ICD-10-CM | POA: Diagnosis not present

## 2020-01-12 DIAGNOSIS — R310 Gross hematuria: Secondary | ICD-10-CM

## 2020-01-12 NOTE — Patient Instructions (Addendum)
Cystoscopy Cystoscopy is a procedure that is used to help diagnose and sometimes treat conditions that affect the lower urinary tract. The lower urinary tract includes the bladder and the urethra. The urethra is the tube that drains urine from the bladder. Cystoscopy is done using a thin, tube-shaped instrument with a light and camera at the end (cystoscope). The cystoscope may be hard or flexible, depending on the goal of the procedure. The cystoscope is inserted through the urethra, into the bladder. Cystoscopy may be recommended if you have:  Urinary tract infections that keep coming back.  Blood in the urine (hematuria).  An inability to control when you urinate (urinary incontinence) or an overactive bladder.  Unusual cells found in a urine sample.  A blockage in the urethra, such as a urinary stone.  Painful urination.  An abnormality in the bladder found during an intravenous pyelogram (IVP) or CT scan. Cystoscopy may also be done to remove a sample of tissue to be examined under a microscope (biopsy). What are the risks? Generally, this is a safe procedure. However, problems may occur, including:  Infection.  Bleeding.  What happens during the procedure?  1. You will be given one or more of the following: ? A medicine to numb the area (local anesthetic). 2. The area around the opening of your urethra will be cleaned. 3. The cystoscope will be passed through your urethra into your bladder. 4. Germ-free (sterile) fluid will flow through the cystoscope to fill your bladder. The fluid will stretch your bladder so that your health care provider can clearly examine your bladder walls. 5. Your doctor will look at the urethra and bladder. 6. The cystoscope will be removed The procedure may vary among health care providers  What can I expect after the procedure? After the procedure, it is common to have: 1. Some soreness or pain in your abdomen and urethra. 2. Urinary symptoms.  These include: ? Mild pain or burning when you urinate. Pain should stop within a few minutes after you urinate. This may last for up to 1 week. ? A small amount of blood in your urine for several days. ? Feeling like you need to urinate but producing only a small amount of urine. Follow these instructions at home: General instructions  Return to your normal activities as told by your health care provider.   Do not drive for 24 hours if you were given a sedative during your procedure.  Watch for any blood in your urine. If the amount of blood in your urine increases, call your health care provider.  If a tissue sample was removed for testing (biopsy) during your procedure, it is up to you to get your test results. Ask your health care provider, or the department that is doing the test, when your results will be ready.  Drink enough fluid to keep your urine pale yellow.  Keep all follow-up visits as told by your health care provider. This is important. Contact a health care provider if you:  Have pain that gets worse or does not get better with medicine, especially pain when you urinate.  Have trouble urinating.  Have more blood in your urine. Get help right away if you:  Have blood clots in your urine.  Have abdominal pain.  Have a fever or chills.  Are unable to urinate. Summary  Cystoscopy is a procedure that is used to help diagnose and sometimes treat conditions that affect the lower urinary tract.  Cystoscopy is done using   a thin, tube-shaped instrument with a light and camera at the end.  After the procedure, it is common to have some soreness or pain in your abdomen and urethra.  Watch for any blood in your urine. If the amount of blood in your urine increases, call your health care provider.  If you were prescribed an antibiotic medicine, take it as told by your health care provider. Do not stop taking the antibiotic even if you start to feel better. This  information is not intended to replace advice given to you by your health care provider. Make sure you discuss any questions you have with your health care provider. Document Revised: 08/04/2018 Document Reviewed: 08/04/2018 Elsevier Patient Education  2020 Elsevier Inc.   
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
## 2020-01-13 LAB — MICROSCOPIC EXAMINATION
Bacteria, UA: NONE SEEN
Epithelial Cells (non renal): NONE SEEN /hpf (ref 0–10)
RBC, Urine: NONE SEEN /hpf (ref 0–2)

## 2020-01-13 LAB — URINALYSIS, COMPLETE
Bilirubin, UA: NEGATIVE
Glucose, UA: NEGATIVE
Ketones, UA: NEGATIVE
Leukocytes,UA: NEGATIVE
Nitrite, UA: NEGATIVE
Protein,UA: NEGATIVE
RBC, UA: NEGATIVE
Specific Gravity, UA: 1.015 (ref 1.005–1.030)
Urobilinogen, Ur: 0.2 mg/dL (ref 0.2–1.0)
pH, UA: 6 (ref 5.0–7.5)

## 2020-01-28 ENCOUNTER — Other Ambulatory Visit: Payer: Self-pay

## 2020-01-28 ENCOUNTER — Ambulatory Visit
Admission: RE | Admit: 2020-01-28 | Discharge: 2020-01-28 | Disposition: A | Discharge status: Home / Self Care | Payer: Managed Care, Other (non HMO) | Source: Ambulatory Visit | Attending: Urology | Admitting: Urology

## 2020-01-28 DIAGNOSIS — R310 Gross hematuria: Secondary | ICD-10-CM | POA: Insufficient documentation

## 2020-01-28 LAB — POCT I-STAT CREATININE: Creatinine, Ser: 1.3 mg/dL — ABNORMAL HIGH (ref 0.61–1.24)

## 2020-01-28 MED ORDER — IOHEXOL 350 MG/ML SOLN
125.0000 mL | Freq: Once | INTRAVENOUS | Status: AC | PRN
  Administered 2020-01-28: 125 mL via INTRAVENOUS

## 2020-02-02 ENCOUNTER — Other Ambulatory Visit: Payer: Self-pay

## 2020-02-02 ENCOUNTER — Ambulatory Visit: Payer: Managed Care, Other (non HMO) | Admitting: Urology

## 2020-02-02 ENCOUNTER — Other Ambulatory Visit: Payer: Self-pay | Admitting: Urology

## 2020-02-02 ENCOUNTER — Other Ambulatory Visit: Payer: Self-pay | Admitting: Radiology

## 2020-02-02 ENCOUNTER — Encounter: Payer: Self-pay | Admitting: Urology

## 2020-02-02 ENCOUNTER — Telehealth: Payer: Self-pay | Admitting: *Deleted

## 2020-02-02 VITALS — BP 113/76 | HR 55 | Ht 71.0 in | Wt 314.0 lb

## 2020-02-02 DIAGNOSIS — R310 Gross hematuria: Secondary | ICD-10-CM

## 2020-02-02 DIAGNOSIS — N401 Enlarged prostate with lower urinary tract symptoms: Secondary | ICD-10-CM

## 2020-02-02 DIAGNOSIS — N210 Calculus in bladder: Secondary | ICD-10-CM

## 2020-02-02 MED ORDER — LIDOCAINE HCL URETHRAL/MUCOSAL 2 % EX GEL
1.0000 "application " | Freq: Once | CUTANEOUS | Status: AC
  Administered 2020-02-02: 1 via URETHRAL

## 2020-02-02 NOTE — Telephone Encounter (Signed)
   [facility] Pre-operative Risk Assessment    HEARTCARE STAFF: - Please ensure there is not already an duplicate clearance open for this procedure. - Under Visit Info/Reason for Call, type in Other and utilize the format Clearance MM/DD/YY or Clearance TBD. Do not use dashes or single digits. - If request is for dental extraction, please clarify the # of teeth to be extracted.  Request for surgical clearance:  1. What type of surgery is being performed? HOLMIUM LASER ENUCLEATION OF PROSTRATE, CYSTOLITHOLAPAXY BPH, BLADDER STONE  2. When is this surgery scheduled? 02/11/20   3. What type of clearance is required (medical clearance vs. Pharmacy clearance to hold med vs. Both)? BOTH  4. Are there any medications that need to be held prior to surgery and how long?ASPIRIN FOR 7 DAYS PRIOR   5. Practice name and name of physician performing surgery? [facility]   6. What is the office phone number? [phone]   7.   What is the office fax number? [phone]  8.   Anesthesia type (None, local, MAC, general) ? UNKNOWN   Fredia Beets 02/02/2020, 4:44 PM  _________________________________________________________________   (provider comments below)

## 2020-02-02 NOTE — Progress Notes (Signed)
Cystoscopy Procedure Note:  Indication: gross hematuria  After informed consent and discussion of the procedure and its risks, Prashant Glosser was positioned and prepped in the standard fashion. Cystoscopy was performed with a flexible cystoscope. The urethra, bladder neck and entire bladder was visualized in a standard fashion. The prostate was large with obstructing lateral lobes and a high bladder neck.  There was a small 1 cm stone at the base of the bladder.  No abnormal bladder lesions.  Retroflexion showing large prostate and stone at the base of the bladder.  Imaging: 1 cm bladder stone, prostate measures 100 g, no other abnormal findings  Findings: Small bladder stone, very large prostate with high bladder neck(100 g)  ------------------------------------------------------------------------------  64 year old male with gross hematuria likely secondary to BPH and 100 g prostate, as well as ongoing bothersome urinary symptoms of weak stream, feeling of incomplete emptying, urgency, and nocturia despite medical management.  We discussed options at length including addition of finasteride, OAB medication, or surgical options with an outlet procedure.  He opts for an outlet procedure.  We discussed the risks and benefits of HoLEP at length.  The procedure requires general anesthesia and takes 2 to 3 hours, and a holmium laser is used to enucleate the prostate and push this tissue into the bladder.  A morcellator is then used to remove this tissue, which is sent for pathology.  The vast majority of patients are able to discharge the same day with a catheter in place for 2 to 3 days, and will follow-up in clinic for a voiding trial.  Approximately 5% of patients will be admitted overnight to monitor the urine, or if they have multiple co-morbidities.  We specifically discussed the risks of bleeding, infection, retrograde ejaculation, temporary urgency and urge incontinence, very low risk of long-term  incontinence, pathologic evaluation of prostate tissue and possible detection of prostate cancer or other malignancy, and possible need for additional procedures.  Schedule HOLEP with cystolitholapaxy In follow-up discuss ED and low testosterone   [name], MD 02/02/2020

## 2020-02-02 NOTE — Patient Instructions (Signed)

## 2020-02-03 ENCOUNTER — Telehealth: Payer: Self-pay | Admitting: Radiology

## 2020-02-03 LAB — URINALYSIS, COMPLETE
Bilirubin, UA: NEGATIVE
Glucose, UA: NEGATIVE
Ketones, UA: NEGATIVE
Leukocytes,UA: NEGATIVE
Nitrite, UA: NEGATIVE
Protein,UA: NEGATIVE
RBC, UA: NEGATIVE
Specific Gravity, UA: 1.025 (ref 1.005–1.030)
Urobilinogen, Ur: 1 mg/dL (ref 0.2–1.0)
pH, UA: 6 (ref 5.0–7.5)

## 2020-02-03 LAB — MICROSCOPIC EXAMINATION
Bacteria, UA: NONE SEEN
RBC, Urine: NONE SEEN /HPF (ref 0–2)

## 2020-02-03 NOTE — Telephone Encounter (Signed)
Per Dr Diamantina Providence, reiterated that bleeding is normal. Advised that patient should increase fluid intake and call back if urination becomes difficult. Wife verbalized understanding.

## 2020-02-03 NOTE — Telephone Encounter (Signed)
Wife states patient had an episode of "very bloody" urination and passed a "quarter sized piece of bloody tissue" after having a cystoscopy yesterday. Reassured wife that bleeding is common after a cystoscopy, this is likely a blood clot and since patient is on aspirin daily this can increase bleeding. Wife states he is urinating without difficulty. Advised patient to increase fluid intake and call back with continued symptoms.

## 2020-02-04 ENCOUNTER — Telehealth: Payer: Self-pay | Admitting: Family Medicine

## 2020-02-04 LAB — CULTURE, URINE COMPREHENSIVE

## 2020-02-04 LAB — CYTOLOGY - NON PAP

## 2020-02-04 NOTE — Telephone Encounter (Signed)
   Primary Cardiologist: [name], MD  Chart reviewed as part of pre-operative protocol coverage. Given past medical history and time since last visit, based on ACC/AHA guidelines, [name] would be at acceptable risk for the planned procedure without further cardiovascular testing.   He may hold his aspirin for 7 days prior to the procedure and resume as soon as hemostasis is achieved.  I will route this recommendation to the requesting party via Epic fax function and remove from pre-op pool.  Please call with questions.  Jossie Ng. Dawnielle Christiana NP-C    02/04/2020, 9:32 AM [place] [address] Office [phone] Fax [phone]

## 2020-02-04 NOTE — Telephone Encounter (Signed)
Ok to hold asa 7 days prior to procedure and resume after [employer]

## 2020-02-04 NOTE — Telephone Encounter (Signed)
Patient notified and voiced understanding.

## 2020-02-04 NOTE — Telephone Encounter (Signed)
-----   Message from Billey Co, MD sent at 02/04/2020  3:01 PM EDT ----- No cancer cells on urine sample, follow up for HOLEP as scheduled  [name], MD 02/04/2020

## 2020-02-07 LAB — PATHOLOGY

## 2020-02-09 ENCOUNTER — Encounter
Admission: RE | Admit: 2020-02-09 | Discharge: 2020-02-09 | Disposition: A | Discharge status: Home / Self Care | Payer: Managed Care, Other (non HMO) | Source: Ambulatory Visit | Attending: Urology | Admitting: Urology

## 2020-02-09 ENCOUNTER — Encounter: Payer: Self-pay | Admitting: Urology

## 2020-02-09 ENCOUNTER — Other Ambulatory Visit: Payer: Self-pay

## 2020-02-09 NOTE — Patient Instructions (Addendum)
Your procedure is scheduled on: 02-11-20 FRIDAY Report to Same Day Surgery 2nd floor medical mall [facility] Entrance-take elevator on left to 2nd floor.  Check in with surgery information desk.) To find out your arrival time please call [phone] between 1PM - 3PM on 02-10-20 THURSDAY  Remember: Instructions that are not followed completely may result in serious medical risk, up to and including death, or upon the discretion of your surgeon and anesthesiologist your surgery may need to be rescheduled.    _x___ 1. Do not eat food after midnight the night before your procedure. NO GUM OR CANDY AFTER MIDNIGHT. You may drink clear liquids up to 2 hours before you are scheduled to arrive at the hospital for your procedure.  Do not drink clear liquids within 2 hours of your scheduled arrival to the hospital.  Clear liquids include  --Water or Apple juice without pulp  --Gatorade  --Black Coffee or Clear Tea (No milk, no creamers, do not add anything to the coffee or Tea-ok to add sugar)   ____Ensure clear carbohydrate drink on the way to the hospital for bariatric patients  ____Ensure clear carbohydrate drink 3 hours before surgery.     __x__ 2. No Alcohol for 24 hours before or after surgery.   __x__3. No Smoking or e-cigarettes for 24 prior to surgery.  Do not use any chewable tobacco products for at least 6 hour prior to surgery   ____  4. Bring all medications with you on the day of surgery if instructed.    __x__ 5. Notify your doctor if there is any change in your medical condition     (cold, fever, infections).    x___6. On the morning of surgery brush your teeth with toothpaste and water.  You may rinse your mouth with mouth wash if you wish.  Do not swallow any toothpaste or mouthwash.   Do not wear jewelry, make-up, hairpins, clips or nail polish.  Do not wear lotions, powders, or perfumes. You may wear deodorant.  Do not shave 48 hours prior to surgery. Men may shave  face and neck.  Do not bring valuables to the hospital.    [facility] is not responsible for any belongings or valuables.               Contacts, dentures or bridgework may not be worn into surgery.  Leave your suitcase in the car. After surgery it may be brought to your room.  For patients admitted to the hospital, discharge time is determined by your treatment team.  _  Patients discharged the day of surgery will not be allowed to drive home.  You will need someone to drive you home and stay with you the night of your procedure.    Please read over the following fact sheets that you were given:   [facility] Preparing for Surgery   _x___ TAKE THE FOLLOWING MEDICATION THE MORNING OF SURGERY WITH A SMALL SIP OF WATER. These include:  1. NORVASC (AMLODIPINE)  2. WELLBUTRIN (BUPROPION)  3. SYNTHROID (LEVOTHYROXINE)  4. LIPITOR (ATORVASTATIN)  5. CYMBALTA (DULOXETINE)  6. GABAPENTIN (NEURONTIN)  7. TERAZOSIN (HYTRIN)  ____Fleets enema or Magnesium Citrate as directed.   ____ Use CHG Soap or sage wipes as directed on instruction sheet   ____ Use inhalers on the day of surgery and bring to hospital day of surgery  ____ Stop Metformin and Janumet 2 days prior to surgery.    ____ Take 1/2 of usual insulin dose  the night before surgery and none on the morning surgery.   _x___ Follow recommendations from Cardiologist, Pulmonologist or PCP regarding stopping Aspirin, Coumadin, Plavix ,Eliquis, Effient, or Pradaxa, and Pletal-ASPIRIN WAS STOPPED 02-03-20  X____Stop Anti-inflammatories such as Advil, Aleve, Ibuprofen, Motrin, Naproxen, Naprosyn, Goodies powders or aspirin products NOW-OK to take Tylenol    ____ Stop supplements until after surgery.    _X___ Bring C-Pap to the hospital.

## 2020-02-09 NOTE — Pre-Procedure Instructions (Signed)
Deberah Pelton, NP  Nurse Practitioner  Specialty:  Cardiology  Telephone Encounter  Signed  Encounter Date:  02/02/2020          Signed       Show:Clear all [x] Manual[x] Template[] Copied  Added by: [x] Deberah Pelton, NP  [] Hover for details    Primary Cardiologist: [name], MD  Chart reviewed as part of pre-operative protocol coverage. Given past medical history and time since last visit, based on ACC/AHA guidelines, [name] would be at acceptable risk for the planned procedure without further cardiovascular testing.   He may hold his aspirin for 7 days prior to the procedure and resume as soon as hemostasis is achieved.  I will route this recommendation to the requesting party via Epic fax function and remove from pre-op pool.  Please call with questions.  Jossie Ng. Cleaver NP-C    02/04/2020, 9:32 AM [place] [address] Office [phone] Fax [phone]

## 2020-02-10 ENCOUNTER — Other Ambulatory Visit
Admission: RE | Admit: 2020-02-10 | Discharge: 2020-02-10 | Disposition: A | Discharge status: Home / Self Care | Payer: Managed Care, Other (non HMO) | Source: Ambulatory Visit | Attending: Urology | Admitting: Urology

## 2020-02-10 DIAGNOSIS — Z20822 Contact with and (suspected) exposure to covid-19: Secondary | ICD-10-CM | POA: Insufficient documentation

## 2020-02-10 DIAGNOSIS — Z01812 Encounter for preprocedural laboratory examination: Secondary | ICD-10-CM | POA: Insufficient documentation

## 2020-02-10 LAB — BASIC METABOLIC PANEL
Anion gap: 11 (ref 5–15)
BUN: 18 mg/dL (ref 8–23)
CO2: 27 mmol/L (ref 22–32)
Calcium: 8.8 mg/dL — ABNORMAL LOW (ref 8.9–10.3)
Chloride: 104 mmol/L (ref 98–111)
Creatinine, Ser: 1.1 mg/dL (ref 0.61–1.24)
GFR calc Af Amer: >60 mL/min (ref >60)
GFR calc non Af Amer: >60 mL/min (ref >60)
Glucose, Bld: 95 mg/dL (ref 70–99)
Potassium: 3.9 mmol/L (ref 3.5–5.1)
Sodium: 142 mmol/L (ref 135–145)

## 2020-02-10 LAB — CBC
HCT: 44 % (ref 39.0–52.0)
Hemoglobin: 14.3 g/dL (ref 13.0–17.0)
MCH: 28.9 pg (ref 26.0–34.0)
MCHC: 32.5 g/dL (ref 30.0–36.0)
MCV: 88.9 fL (ref 80.0–100.0)
Platelets: 226 10*3/uL (ref 150–400)
RBC: 4.95 MIL/uL (ref 4.22–5.81)
RDW: 13.8 % (ref 11.5–15.5)
WBC: 6.6 10*3/uL (ref 4.0–10.5)
nRBC: 0 % (ref 0.0–0.2)

## 2020-02-10 LAB — SARS CORONAVIRUS 2 (TAT 6-24 HRS): SARS Coronavirus 2: NEGATIVE

## 2020-02-10 NOTE — Pre-Procedure Instructions (Signed)
CARDIAC CLEARANCE IN EPIC

## 2020-02-11 ENCOUNTER — Other Ambulatory Visit: Payer: Self-pay

## 2020-02-11 ENCOUNTER — Encounter: Payer: Self-pay | Admitting: Urology

## 2020-02-11 ENCOUNTER — Encounter
Admission: RE | Disposition: A | Discharge status: Home / Self Care | Payer: Self-pay | Source: Home / Self Care | Attending: Urology

## 2020-02-11 ENCOUNTER — Encounter: Payer: Self-pay | Admitting: Certified Registered Nurse Anesthetist

## 2020-02-11 ENCOUNTER — Ambulatory Visit
Admission: RE | Admit: 2020-02-11 | Discharge: 2020-02-11 | Disposition: A | Discharge status: Home / Self Care | Payer: Managed Care, Other (non HMO) | Attending: Urology | Admitting: Urology

## 2020-02-11 DIAGNOSIS — R3912 Poor urinary stream: Secondary | ICD-10-CM | POA: Insufficient documentation

## 2020-02-11 DIAGNOSIS — Z539 Procedure and treatment not carried out, unspecified reason: Secondary | ICD-10-CM | POA: Insufficient documentation

## 2020-02-11 DIAGNOSIS — R351 Nocturia: Secondary | ICD-10-CM | POA: Insufficient documentation

## 2020-02-11 DIAGNOSIS — N401 Enlarged prostate with lower urinary tract symptoms: Secondary | ICD-10-CM | POA: Diagnosis not present

## 2020-02-11 DIAGNOSIS — R3914 Feeling of incomplete bladder emptying: Secondary | ICD-10-CM | POA: Insufficient documentation

## 2020-02-11 DIAGNOSIS — R3915 Urgency of urination: Secondary | ICD-10-CM | POA: Diagnosis not present

## 2020-02-11 DIAGNOSIS — N210 Calculus in bladder: Secondary | ICD-10-CM | POA: Insufficient documentation

## 2020-02-11 SURGERY — ENUCLEATION, PROSTATE, USING LASER, WITH MORCELLATION
Anesthesia: General

## 2020-02-11 MED ORDER — CIPROFLOXACIN IN D5W 400 MG/200ML IV SOLN: 400.0000 mg | INTRAVENOUS | Status: DC

## 2020-02-11 MED ORDER — CHLORHEXIDINE GLUCONATE 0.12 % MT SOLN: 15.0000 mL | Freq: Once | OROMUCOSAL | Status: AC

## 2020-02-11 MED ORDER — FAMOTIDINE 20 MG PO TABS: 20.0000 mg | ORAL_TABLET | Freq: Once | ORAL | Status: AC

## 2020-02-11 MED ORDER — FAMOTIDINE 20 MG PO TABS
ORAL_TABLET | ORAL | Status: AC
  Administered 2020-02-11: 20 mg via ORAL
  Filled 2020-02-11: qty 1

## 2020-02-11 MED ORDER — ORAL CARE MOUTH RINSE: 15.0000 mL | Freq: Once | OROMUCOSAL | Status: AC

## 2020-02-11 MED ORDER — CIPROFLOXACIN IN D5W 400 MG/200ML IV SOLN
INTRAVENOUS | Status: AC
  Filled 2020-02-11: qty 200

## 2020-02-11 MED ORDER — LACTATED RINGERS IV SOLN: INTRAVENOUS | Status: DC

## 2020-02-11 MED ORDER — CHLORHEXIDINE GLUCONATE 0.12 % MT SOLN
OROMUCOSAL | Status: AC
  Administered 2020-02-11: 15 mL via OROMUCOSAL
  Filled 2020-02-11: qty 15

## 2020-02-11 SURGICAL SUPPLY — 37 items
ADAPTER IRRIG TUBE 2 SPIKE SOL (ADAPTER) ×4 IMPLANT
BAG DRAIN CYSTO-URO LG1000N (MISCELLANEOUS) ×2 IMPLANT
BAG URO DRAIN 4000ML (MISCELLANEOUS) ×2 IMPLANT
CATH FOLEY 3WAY 30CC 24FR (CATHETERS) ×1
CATH URETL 5X70 OPEN END (CATHETERS) ×2 IMPLANT
CATH URTH STD 24FR FL 3W 2 (CATHETERS) ×1 IMPLANT
CONTAINER COLLECT MORCELLATR (MISCELLANEOUS) ×1 IMPLANT
DRAPE UTILITY 15X26 TOWEL STRL (DRAPES) IMPLANT
ELECT BIVAP BIPO 22/24 DONUT (ELECTROSURGICAL)
ELECTRD BIVAP BIPO 22/24 DONUT (ELECTROSURGICAL) IMPLANT
FILTER OVERFLOW MORCELLATOR (FILTER) ×1 IMPLANT
GLOVE BIOGEL PI IND STRL 7.5 (GLOVE) ×1 IMPLANT
GLOVE BIOGEL PI INDICATOR 7.5 (GLOVE) ×1
GOWN STRL REUS W/ TWL LRG LVL3 (GOWN DISPOSABLE) ×1 IMPLANT
GOWN STRL REUS W/ TWL XL LVL3 (GOWN DISPOSABLE) ×1 IMPLANT
GOWN STRL REUS W/TWL LRG LVL3 (GOWN DISPOSABLE) ×1
GOWN STRL REUS W/TWL XL LVL3 (GOWN DISPOSABLE) ×1
HOLDER FOLEY CATH W/STRAP (MISCELLANEOUS) ×2 IMPLANT
KIT PROBE TRILOGY 3.9X350 (MISCELLANEOUS) IMPLANT
KIT TURNOVER CYSTO (KITS) ×2 IMPLANT
LASER FIBER 550M SMARTSCOPE (Laser) ×2 IMPLANT
MBRN O SEALING YLW 17 FOR INST (MISCELLANEOUS) ×2
MEMBRANE SLNG YLW 17 FOR INST (MISCELLANEOUS) ×1 IMPLANT
MORCELLATOR COLLECT CONTAINER (MISCELLANEOUS) ×2
MORCELLATOR OVERFLOW FILTER (FILTER) ×2
MORCELLATOR ROTATION 4.75 335 (MISCELLANEOUS) ×2 IMPLANT
PACK CYSTO AR (MISCELLANEOUS) ×2 IMPLANT
SET CYSTO W/LG BORE CLAMP LF (SET/KITS/TRAYS/PACK) ×2 IMPLANT
SET IRRIG Y TYPE TUR BLADDER L (SET/KITS/TRAYS/PACK) ×2 IMPLANT
SLEEVE PROTECTION STRL DISP (MISCELLANEOUS) ×4 IMPLANT
SOL .9 NS 3000ML IRR  AL (IV SOLUTION) ×4
SOL .9 NS 3000ML IRR UROMATIC (IV SOLUTION) ×4 IMPLANT
SURGILUBE 2OZ TUBE FLIPTOP (MISCELLANEOUS) ×2 IMPLANT
SYRINGE IRR TOOMEY STRL 70CC (SYRINGE) ×2 IMPLANT
TUBE PUMP MORCELLATOR PIRANHA (TUBING) ×2 IMPLANT
WATER STERILE IRR 1000ML POUR (IV SOLUTION) ×2 IMPLANT
WATER STERILE IRR 3000ML UROMA (IV SOLUTION) ×2 IMPLANT

## 2020-02-11 NOTE — Progress Notes (Signed)
Patient was scheduled for HOLEP today for 100 g prostate and refractory BPH symptoms.  The [employer] morcellator malfunctioned in the first case and was no longer usable.  I explained this to the patient at length, is very empathetic of having to cancel his case today and reschedule.  I do not think it would be safe to perform HOLEP with a non-functioning morcellator to remove the tissue, and I explained this at length.  We will work with clinic to reschedule ASAP.  [name], MD 02/11/2020

## 2020-02-15 ENCOUNTER — Ambulatory Visit: Payer: Managed Care, Other (non HMO) | Admitting: Urology

## 2020-02-16 ENCOUNTER — Other Ambulatory Visit: Payer: Self-pay

## 2020-02-16 DIAGNOSIS — Z01818 Encounter for other preprocedural examination: Secondary | ICD-10-CM

## 2020-02-17 ENCOUNTER — Other Ambulatory Visit: Payer: Managed Care, Other (non HMO)

## 2020-02-17 ENCOUNTER — Other Ambulatory Visit: Payer: Self-pay

## 2020-02-17 DIAGNOSIS — Z01818 Encounter for other preprocedural examination: Secondary | ICD-10-CM

## 2020-02-18 LAB — URINALYSIS, COMPLETE
Bilirubin, UA: NEGATIVE
Glucose, UA: NEGATIVE
Ketones, UA: NEGATIVE
Leukocytes,UA: NEGATIVE
Nitrite, UA: NEGATIVE
Protein,UA: NEGATIVE
RBC, UA: NEGATIVE
Specific Gravity, UA: 1.025 (ref 1.005–1.030)
Urobilinogen, Ur: 1 mg/dL (ref 0.2–1.0)
pH, UA: 6 (ref 5.0–7.5)

## 2020-02-18 LAB — MICROSCOPIC EXAMINATION: Bacteria, UA: NONE SEEN

## 2020-02-21 LAB — CULTURE, URINE COMPREHENSIVE

## 2020-02-23 ENCOUNTER — Other Ambulatory Visit: Payer: Self-pay

## 2020-02-23 ENCOUNTER — Other Ambulatory Visit: Payer: Self-pay | Admitting: Radiology

## 2020-02-23 ENCOUNTER — Other Ambulatory Visit
Admission: RE | Admit: 2020-02-23 | Discharge: 2020-02-23 | Disposition: A | Discharge status: Home / Self Care | Payer: Managed Care, Other (non HMO) | Source: Ambulatory Visit | Attending: Urology | Admitting: Urology

## 2020-02-23 DIAGNOSIS — N210 Calculus in bladder: Secondary | ICD-10-CM

## 2020-02-23 DIAGNOSIS — N401 Enlarged prostate with lower urinary tract symptoms: Secondary | ICD-10-CM

## 2020-02-23 DIAGNOSIS — Z20822 Contact with and (suspected) exposure to covid-19: Secondary | ICD-10-CM | POA: Diagnosis not present

## 2020-02-23 DIAGNOSIS — Z01812 Encounter for preprocedural laboratory examination: Secondary | ICD-10-CM | POA: Insufficient documentation

## 2020-02-23 LAB — SARS CORONAVIRUS 2 (TAT 6-24 HRS): SARS Coronavirus 2: NEGATIVE

## 2020-02-25 ENCOUNTER — Ambulatory Visit: Payer: Managed Care, Other (non HMO)

## 2020-02-25 ENCOUNTER — Ambulatory Visit
Admission: RE | Admit: 2020-02-25 | Discharge: 2020-02-25 | Disposition: A | Discharge status: Home / Self Care | Payer: Managed Care, Other (non HMO) | Attending: Urology | Admitting: Urology

## 2020-02-25 ENCOUNTER — Ambulatory Visit: Payer: Managed Care, Other (non HMO) | Admitting: Anesthesiology

## 2020-02-25 ENCOUNTER — Other Ambulatory Visit: Payer: Self-pay

## 2020-02-25 ENCOUNTER — Encounter
Admission: RE | Disposition: A | Discharge status: Home / Self Care | Payer: Self-pay | Source: Home / Self Care | Attending: Urology

## 2020-02-25 ENCOUNTER — Other Ambulatory Visit: Payer: Self-pay | Admitting: Urology

## 2020-02-25 ENCOUNTER — Encounter: Payer: Self-pay | Admitting: Urology

## 2020-02-25 DIAGNOSIS — Z87891 Personal history of nicotine dependence: Secondary | ICD-10-CM | POA: Diagnosis not present

## 2020-02-25 DIAGNOSIS — R319 Hematuria, unspecified: Secondary | ICD-10-CM | POA: Diagnosis not present

## 2020-02-25 DIAGNOSIS — E039 Hypothyroidism, unspecified: Secondary | ICD-10-CM | POA: Diagnosis not present

## 2020-02-25 DIAGNOSIS — N401 Enlarged prostate with lower urinary tract symptoms: Secondary | ICD-10-CM | POA: Insufficient documentation

## 2020-02-25 DIAGNOSIS — R3912 Poor urinary stream: Secondary | ICD-10-CM | POA: Insufficient documentation

## 2020-02-25 DIAGNOSIS — I10 Essential (primary) hypertension: Secondary | ICD-10-CM | POA: Insufficient documentation

## 2020-02-25 DIAGNOSIS — Z9049 Acquired absence of other specified parts of digestive tract: Secondary | ICD-10-CM | POA: Insufficient documentation

## 2020-02-25 DIAGNOSIS — F329 Major depressive disorder, single episode, unspecified: Secondary | ICD-10-CM | POA: Diagnosis not present

## 2020-02-25 DIAGNOSIS — N210 Calculus in bladder: Secondary | ICD-10-CM

## 2020-02-25 DIAGNOSIS — G4730 Sleep apnea, unspecified: Secondary | ICD-10-CM | POA: Diagnosis not present

## 2020-02-25 DIAGNOSIS — R338 Other retention of urine: Secondary | ICD-10-CM | POA: Diagnosis not present

## 2020-02-25 DIAGNOSIS — G709 Myoneural disorder, unspecified: Secondary | ICD-10-CM | POA: Diagnosis not present

## 2020-02-25 DIAGNOSIS — R3914 Feeling of incomplete bladder emptying: Secondary | ICD-10-CM | POA: Diagnosis not present

## 2020-02-25 HISTORY — PX: HOLEP-LASER ENUCLEATION OF THE PROSTATE WITH MORCELLATION: SHX6641

## 2020-02-25 SURGERY — ENUCLEATION, PROSTATE, USING LASER, WITH MORCELLATION
Anesthesia: General

## 2020-02-25 MED ORDER — HYDROCODONE-ACETAMINOPHEN 5-325 MG PO TABS: 1.0000 | ORAL_TABLET | ORAL | 0 refills | Status: AC | PRN

## 2020-02-25 MED ORDER — FAMOTIDINE 20 MG PO TABS: 20.0000 mg | ORAL_TABLET | Freq: Once | ORAL | Status: AC

## 2020-02-25 MED ORDER — FENTANYL CITRATE (PF) 100 MCG/2ML IJ SOLN: 25.0000 ug | INTRAMUSCULAR | Status: DC | PRN

## 2020-02-25 MED ORDER — CHLORHEXIDINE GLUCONATE 0.12 % MT SOLN
OROMUCOSAL | Status: AC
  Administered 2020-02-25: 15 mL via OROMUCOSAL
  Filled 2020-02-25: qty 15

## 2020-02-25 MED ORDER — BELLADONNA ALKALOIDS-OPIUM 16.2-60 MG RE SUPP
RECTAL | Status: AC
  Filled 2020-02-25: qty 1

## 2020-02-25 MED ORDER — LACTATED RINGERS IV SOLN: INTRAVENOUS | Status: DC

## 2020-02-25 MED ORDER — ROCURONIUM BROMIDE 10 MG/ML (PF) SYRINGE
PREFILLED_SYRINGE | INTRAVENOUS | Status: AC
  Filled 2020-02-25: qty 10

## 2020-02-25 MED ORDER — OXYCODONE HCL 5 MG PO TABS: 5.0000 mg | ORAL_TABLET | Freq: Once | ORAL | Status: DC | PRN

## 2020-02-25 MED ORDER — FAMOTIDINE 20 MG PO TABS
ORAL_TABLET | ORAL | Status: AC
  Administered 2020-02-25: 20 mg via ORAL
  Filled 2020-02-25: qty 1

## 2020-02-25 MED ORDER — LIDOCAINE HCL (CARDIAC) PF 100 MG/5ML IV SOSY
PREFILLED_SYRINGE | INTRAVENOUS | Status: DC | PRN
  Administered 2020-02-25: 100 mg via INTRAVENOUS

## 2020-02-25 MED ORDER — FENTANYL CITRATE (PF) 100 MCG/2ML IJ SOLN
INTRAMUSCULAR | Status: DC | PRN
  Administered 2020-02-25: 100 ug via INTRAVENOUS

## 2020-02-25 MED ORDER — MIDAZOLAM HCL 2 MG/2ML IJ SOLN
INTRAMUSCULAR | Status: DC | PRN
  Administered 2020-02-25: 2 mg via INTRAVENOUS

## 2020-02-25 MED ORDER — CIPROFLOXACIN IN D5W 400 MG/200ML IV SOLN
INTRAVENOUS | Status: AC
  Filled 2020-02-25: qty 200

## 2020-02-25 MED ORDER — PROPOFOL 10 MG/ML IV BOLUS
INTRAVENOUS | Status: AC
  Filled 2020-02-25: qty 20

## 2020-02-25 MED ORDER — DEXAMETHASONE SODIUM PHOSPHATE 10 MG/ML IJ SOLN
INTRAMUSCULAR | Status: DC | PRN
  Administered 2020-02-25: 6 mg via INTRAVENOUS

## 2020-02-25 MED ORDER — SULFAMETHOXAZOLE-TRIMETHOPRIM 800-160 MG PO TABS
1.0000 | ORAL_TABLET | Freq: Every day | ORAL | 0 refills | Status: DC
Start: 2020-02-25 — End: 2020-03-20

## 2020-02-25 MED ORDER — ONDANSETRON HCL 4 MG/2ML IJ SOLN
INTRAMUSCULAR | Status: DC | PRN
  Administered 2020-02-25: 4 mg via INTRAVENOUS

## 2020-02-25 MED ORDER — PROPOFOL 10 MG/ML IV BOLUS
INTRAVENOUS | Status: DC | PRN
  Administered 2020-02-25: 170 mg via INTRAVENOUS

## 2020-02-25 MED ORDER — SUGAMMADEX SODIUM 500 MG/5ML IV SOLN
INTRAVENOUS | Status: DC | PRN
  Administered 2020-02-25: 400 mg via INTRAVENOUS

## 2020-02-25 MED ORDER — MIDAZOLAM HCL 2 MG/2ML IJ SOLN
INTRAMUSCULAR | Status: AC
  Filled 2020-02-25: qty 2

## 2020-02-25 MED ORDER — BELLADONNA ALKALOIDS-OPIUM 16.2-60 MG RE SUPP
RECTAL | Status: DC | PRN
  Administered 2020-02-25: 1 via RECTAL

## 2020-02-25 MED ORDER — FENTANYL CITRATE (PF) 100 MCG/2ML IJ SOLN
INTRAMUSCULAR | Status: AC
  Filled 2020-02-25: qty 2

## 2020-02-25 MED ORDER — OXYCODONE HCL 5 MG/5ML PO SOLN: 5.0000 mg | Freq: Once | ORAL | Status: DC | PRN

## 2020-02-25 MED ORDER — CHLORHEXIDINE GLUCONATE 0.12 % MT SOLN: 15.0000 mL | Freq: Once | OROMUCOSAL | Status: AC

## 2020-02-25 MED ORDER — ORAL CARE MOUTH RINSE: 15.0000 mL | Freq: Once | OROMUCOSAL | Status: AC

## 2020-02-25 MED ORDER — LIDOCAINE HCL (PF) 2 % IJ SOLN
INTRAMUSCULAR | Status: AC
  Filled 2020-02-25: qty 5

## 2020-02-25 MED ORDER — ROCURONIUM BROMIDE 100 MG/10ML IV SOLN
INTRAVENOUS | Status: DC | PRN
  Administered 2020-02-25: 10 mg via INTRAVENOUS
  Administered 2020-02-25: 50 mg via INTRAVENOUS

## 2020-02-25 MED ORDER — CIPROFLOXACIN IN D5W 400 MG/200ML IV SOLN
400.0000 mg | INTRAVENOUS | Status: AC
  Administered 2020-02-25: 400 mg via INTRAVENOUS

## 2020-02-25 SURGICAL SUPPLY — 37 items
ADAPTER IRRIG TUBE 2 SPIKE SOL (ADAPTER) ×4 IMPLANT
BAG DRAIN CYSTO-URO LG1000N (MISCELLANEOUS) ×2 IMPLANT
BAG URO DRAIN 4000ML (MISCELLANEOUS) ×2 IMPLANT
CATH FOLEY 3WAY 30CC 24FR (CATHETERS) ×1
CATH URETL 5X70 OPEN END (CATHETERS) ×6 IMPLANT
CATH URTH STD 24FR FL 3W 2 (CATHETERS) ×1 IMPLANT
CONTAINER COLLECT MORCELLATR (MISCELLANEOUS) ×1 IMPLANT
DRAPE UTILITY 15X26 TOWEL STRL (DRAPES) ×2 IMPLANT
ELECT BIVAP BIPO 22/24 DONUT (ELECTROSURGICAL)
ELECTRD BIVAP BIPO 22/24 DONUT (ELECTROSURGICAL) IMPLANT
FILTER OVERFLOW MORCELLATOR (FILTER) ×1 IMPLANT
GLOVE BIOGEL PI IND STRL 7.5 (GLOVE) ×1 IMPLANT
GLOVE BIOGEL PI INDICATOR 7.5 (GLOVE) ×1
GOWN STRL REUS W/ TWL LRG LVL3 (GOWN DISPOSABLE) ×1 IMPLANT
GOWN STRL REUS W/ TWL XL LVL3 (GOWN DISPOSABLE) ×1 IMPLANT
GOWN STRL REUS W/TWL LRG LVL3 (GOWN DISPOSABLE) ×1
GOWN STRL REUS W/TWL XL LVL3 (GOWN DISPOSABLE) ×1
HOLDER FOLEY CATH W/STRAP (MISCELLANEOUS) ×2 IMPLANT
KIT PROBE TRILOGY 3.9X350 (MISCELLANEOUS) IMPLANT
KIT TURNOVER CYSTO (KITS) ×2 IMPLANT
LASER FIBER 550M SMARTSCOPE (Laser) ×2 IMPLANT
MBRN O SEALING YLW 17 FOR INST (MISCELLANEOUS) ×2
MEMBRANE SLNG YLW 17 FOR INST (MISCELLANEOUS) ×1 IMPLANT
MORCELLATOR COLLECT CONTAINER (MISCELLANEOUS) ×2
MORCELLATOR OVERFLOW FILTER (FILTER) ×2
MORCELLATOR ROTATION 4.75 335 (MISCELLANEOUS) ×2 IMPLANT
PACK CYSTO AR (MISCELLANEOUS) ×2 IMPLANT
SET CYSTO W/LG BORE CLAMP LF (SET/KITS/TRAYS/PACK) ×2 IMPLANT
SET IRRIG Y TYPE TUR BLADDER L (SET/KITS/TRAYS/PACK) ×2 IMPLANT
SLEEVE PROTECTION STRL DISP (MISCELLANEOUS) ×4 IMPLANT
SOL .9 NS 3000ML IRR  AL (IV SOLUTION) ×12
SOL .9 NS 3000ML IRR UROMATIC (IV SOLUTION) ×12 IMPLANT
SURGILUBE 2OZ TUBE FLIPTOP (MISCELLANEOUS) ×2 IMPLANT
SYRINGE IRR TOOMEY STRL 70CC (SYRINGE) ×2 IMPLANT
TUBE PUMP MORCELLATOR PIRANHA (TUBING) ×2 IMPLANT
WATER STERILE IRR 1000ML POUR (IV SOLUTION) ×2 IMPLANT
WATER STERILE IRR 3000ML UROMA (IV SOLUTION) ×2 IMPLANT

## 2020-02-25 NOTE — H&P (Signed)
UROLOGY H&P UPDATE  Agree with prior H&P dated 02/02/2020, 100 g prostate with bothersome BPH symptoms, small bladder stone.  Cardiac: RRR Lungs: CTA bilaterally  Laterality: N/A Procedure: HOLEP  Urine: Urine culture 6/24 no growth  We discussed the risks and benefits of HoLEP at length.  The procedure requires general anesthesia and takes 2 to 3 hours, and a holmium laser is used to enucleate the prostate and push this tissue into the bladder.  A morcellator is then used to remove this tissue, which is sent for pathology.  The vast majority of patients are able to discharge the same day with a catheter in place for 2 to 3 days, and will follow-up in clinic for a voiding trial.  Approximately 5% of patients will be admitted overnight to monitor the urine, or if they have multiple co-morbidities.  We specifically discussed the risks of bleeding, infection, retrograde ejaculation, temporary urgency and urge incontinence, very low risk of long-term incontinence, pathologic evaluation of prostate tissue and possible detection of prostate cancer or other malignancy, and possible need for additional procedures.     Billey Co, MD 02/25/2020

## 2020-02-25 NOTE — Progress Notes (Signed)
This RN did foley cath teaching on care of foley. Pt and family do not want to change into leg bag prior to discharge. Pt given leg bag and educated on how to change out if he desires. Pt in NAD. Pt and spouse verbalize understanding

## 2020-02-25 NOTE — Discharge Instructions (Signed)

## 2020-02-25 NOTE — Transfer of Care (Signed)
Immediate Anesthesia Transfer of Care Note  Patient: [name]  Procedure(s) Performed: HOLEP-LASER ENUCLEATION OF THE PROSTATE WITH MORCELLATION (N/A )  Patient Location: PACU  Anesthesia Type:General  Level of Consciousness: awake, alert  and oriented  Airway & Oxygen Therapy: Patient Spontanous Breathing and Patient connected to face mask oxygen  Post-op Assessment: Report given to RN and Post -op Vital signs reviewed and stable  Post vital signs: Reviewed and stable  Last Vitals:  Vitals Value Taken Time  BP 96/52 02/25/20 1013  Temp 35.9 C 02/25/20 1013  Pulse 55 02/25/20 1018  Resp 19 02/25/20 1018  SpO2 100 % 02/25/20 1018  Vitals shown include unvalidated device data.  Last Pain:  Vitals:   02/25/20 0635  PainSc: 0-No pain         Complications: No complications documented.

## 2020-02-25 NOTE — Anesthesia Procedure Notes (Signed)
Procedure Name: Intubation Date/Time: 02/25/2020 8:24 AM Performed by: Babs Sciara, CRNA Pre-anesthesia Checklist: Patient identified, Emergency Drugs available, Suction available, Patient being monitored and Timeout performed Patient Re-evaluated:Patient Re-evaluated prior to induction Oxygen Delivery Method: Circle system utilized Preoxygenation: Pre-oxygenation with 100% oxygen Induction Type: IV induction Ventilation: Mask ventilation without difficulty and Oral airway inserted - appropriate to patient size Laryngoscope Size: McGraph and 4 Grade View: Grade I Tube type: Oral Tube size: 7.5 mm Number of attempts: 1 (laryngoscopy x 2) Airway Equipment and Method: Stylet and Video-laryngoscopy Placement Confirmation: positive ETCO2,  CO2 detector and breath sounds checked- equal and bilateral (able to see cords with 4 mac and cricoid pressure but couldnt maneuver ett through cords, easily intubated using  mcGrath) Secured at: 23 (lip) cm Dental Injury: Teeth and Oropharynx as per pre-operative assessment
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
## 2020-02-25 NOTE — Op Note (Signed)
Date of procedure: 02/25/20  Preoperative diagnosis:  1. BPH with weak stream and incomplete bladder emptying  Postoperative diagnosis:  1. Same  Procedure: 1. HoLEP (Holmium Laser Enucleation of the Prostate)  Surgeon: [name], MD  Anesthesia: General  Complications: None  Intraoperative findings:  1.  Small bladder stone evacuated through the scope 2.  No tumors or lesions, ureteral orifices orthotopic bilaterally 3.  Uncomplicated HOLEP, ureteral orifices and verumontanum intact at conclusion of case  EBL: 10 mL  Specimens: Prostate chips  Enucleation time: 53 minutes  Morcellation time: 10 minutes  Intra-op weight: 44 g  Drains: 24 French three-way, 60 cc in balloon  Indication: [name] is a 64 y.o. patient with BPH and 100 g prostate who had bothersome symptoms refractory to medications and opted for HOLEP.  After reviewing the management options for treatment, they elected to proceed with the above surgical procedure(s). We have discussed the potential benefits and risks of the procedure, side effects of the proposed treatment, the likelihood of the patient achieving the goals of the procedure, and any potential problems that might occur during the procedure or recuperation.  We specifically discussed the risks of bleeding, infection, hematuria and clot retention, need for additional procedures, possible overnight hospital stay, temporary urgency and incontinence, rare long-term incontinence, and retrograde ejaculation.  Informed consent has been obtained.   Description of procedure:  The patient was taken to the operating room and general anesthesia was induced.  The patient was placed in the dorsal lithotomy position, prepped and draped in the usual sterile fashion, and preoperative antibiotics were administered.  SCDs were placed for DVT prophylaxis.  A preoperative time-out was performed.   The 28 French continuous flow resectoscope was inserted into the  urethra using the visual obturator  The prostate was large with a high bladder neck. The bladder was thoroughly inspected and notable for moderate trabeculations, as well as a small diverticulum at the right lateral wall.  The ureteral orifices were located in orthotopic position.  The laser was set to 2 J and 50 Hz and was used to make an incision at the 6 o'clock position to the level of the capsule from the bladder neck to the verumontanum.  The lateral lobes were then incised circumferentially until they were disconnected from the surrounding tissue.  The capsule was examined and laser was used for meticulous hemostasis.    The 35 French resectoscope was then switched out for the 42 French nephroscope and the lobes were morcellated and the tissue sent to pathology.  A 24 French three-way catheter was inserted easily, and CBI was initiated.  60 cc were placed in the balloon.  Urine was light pink.  The catheter irrigated easily with a Toomey syringe.  A belladonna suppository was placed.  The patient tolerated the procedure well without any immediate complications and was extubated and transferred to the recovery room in stable condition.  Urine was clear on fast CBI.  Disposition: Stable to PACU  Plan: Wean CBI in PACU, anticipate discharge home today with void trial in clinic in 2-3 days  [name], MD 02/25/2020

## 2020-02-26 ENCOUNTER — Encounter: Payer: Self-pay | Admitting: Urology

## 2020-02-29 ENCOUNTER — Ambulatory Visit: Payer: Managed Care, Other (non HMO) | Admitting: Physician Assistant

## 2020-02-29 ENCOUNTER — Encounter: Payer: Self-pay | Admitting: Physician Assistant

## 2020-02-29 ENCOUNTER — Ambulatory Visit (INDEPENDENT_AMBULATORY_CARE_PROVIDER_SITE_OTHER): Payer: Managed Care, Other (non HMO) | Admitting: Physician Assistant

## 2020-02-29 ENCOUNTER — Other Ambulatory Visit: Payer: Self-pay

## 2020-02-29 VITALS — BP 121/77 | HR 70 | Ht 71.0 in | Wt 306.3 lb

## 2020-02-29 DIAGNOSIS — N401 Enlarged prostate with lower urinary tract symptoms: Secondary | ICD-10-CM

## 2020-02-29 LAB — BLADDER SCAN AMB NON-IMAGING: Scan Result: 2

## 2020-02-29 LAB — SURGICAL PATHOLOGY

## 2020-02-29 NOTE — Anesthesia Postprocedure Evaluation (Signed)
Anesthesia Post Note  Patient: [name]  Procedure(s) Performed: HOLEP-LASER ENUCLEATION OF THE PROSTATE WITH MORCELLATION (N/A )  Patient location during evaluation: PACU Anesthesia Type: General Level of consciousness: awake and alert and oriented Pain management: pain level controlled Vital Signs Assessment: post-procedure vital signs reviewed and stable Respiratory status: spontaneous breathing Cardiovascular status: blood pressure returned to baseline Anesthetic complications: no   No complications documented.   Last Vitals:  Vitals:   02/25/20 1131 02/25/20 1144  BP:  106/62  Pulse: (!) 51   Resp:    Temp:  (!) 36.2 C  SpO2: 96%     Last Pain:  Vitals:   02/25/20 1144  TempSrc: Temporal  PainSc: 1                  Burna Atlas

## 2020-02-29 NOTE — Progress Notes (Signed)
Afternoon follow-up  Patient returned to clinic this afternoon for repeat PVR. He reports drinking approximately 78oz of fluid. He has been able to urinate. He has had mild urinary leakage. PVR 4mL.  Results for orders placed or performed in visit on 02/29/20  BLADDER SCAN AMB NON-IMAGING  Result Value Ref Range   Scan Result 2      Voiding trial passed. Counseled patient to begin Kegel exercises 3x10 sets daily; written and verbal instructions provided today. Provided work note with guidance to avoid heavy lifting over 20lbs in the first 2 weeks after surgery.  Follow up: as below Future Appointments  Date Time Provider [place]  04/26/2020  1:00 PM Billey Co, MD [facility] None

## 2020-02-29 NOTE — Patient Instructions (Signed)

## 2020-02-29 NOTE — Progress Notes (Signed)
Fill and Pull Catheter Removal  Patient is present today for a catheter removal.  Patient was cleaned and prepped in a sterile fashion 134ml of sterile water was instilled into the bladder when the patient felt the urge to urinate. 96ml of water was then drained from the balloon.  A 24FR three-way foley cath was removed from the bladder no complications were noted .  Patient was then given some time to void on their own.  Patient can void  15ml on their own after some time.  Patient tolerated well.  Performed by: Debroah Loop, PA-C   Follow up/ Additional notes: Push fluids and RTC this afternoon for PVR.

## 2020-03-01 ENCOUNTER — Telehealth: Payer: Self-pay

## 2020-03-01 LAB — PATHOLOGY

## 2020-03-01 NOTE — Telephone Encounter (Signed)
Would recommend starting with dulcolax suppository and/or enema, as well as miralax. Push fluids as well, thanks  [name], MD 03/01/2020

## 2020-03-01 NOTE — Telephone Encounter (Signed)
See my chart message

## 2020-03-01 NOTE — Telephone Encounter (Signed)
-----   Message from Billey Co, MD sent at 03/01/2020  8:12 AM EDT ----- Good news- HoLEP showed only benign prostate tissue, keep follow up as scheduled, encourage kegels.   Thanks [name], MD 03/01/2020

## 2020-03-13 ENCOUNTER — Other Ambulatory Visit: Payer: Self-pay | Admitting: Cardiology

## 2020-03-20 ENCOUNTER — Other Ambulatory Visit: Payer: Self-pay

## 2020-03-20 ENCOUNTER — Ambulatory Visit: Payer: Managed Care, Other (non HMO) | Admitting: Family Medicine

## 2020-03-20 ENCOUNTER — Encounter: Payer: Self-pay | Admitting: Family Medicine

## 2020-03-20 VITALS — BP 108/68 | HR 56 | Temp 97.9°F | Ht 70.25 in | Wt 307.2 lb

## 2020-03-20 DIAGNOSIS — I10 Essential (primary) hypertension: Secondary | ICD-10-CM

## 2020-03-20 DIAGNOSIS — F3342 Major depressive disorder, recurrent, in full remission: Secondary | ICD-10-CM | POA: Diagnosis not present

## 2020-03-20 DIAGNOSIS — Z23 Encounter for immunization: Secondary | ICD-10-CM

## 2020-03-20 DIAGNOSIS — Z945 Skin transplant status: Secondary | ICD-10-CM

## 2020-03-20 DIAGNOSIS — L819 Disorder of pigmentation, unspecified: Secondary | ICD-10-CM

## 2020-03-20 DIAGNOSIS — G479 Sleep disorder, unspecified: Secondary | ICD-10-CM

## 2020-03-20 DIAGNOSIS — E039 Hypothyroidism, unspecified: Secondary | ICD-10-CM

## 2020-03-20 MED ORDER — AMLODIPINE BESYLATE 5 MG PO TABS: 5.0000 mg | ORAL_TABLET | Freq: Every morning | ORAL | 3 refills | Status: DC

## 2020-03-20 NOTE — Patient Instructions (Signed)
Great to meet you!  Skin lesion - not sure what this is - try Hydrocortisone cream and if helping but worse let me know and can try a higher dose steroid cream - if you have lesions that do not go away - let me know  #Referral I have placed a referral to a specialist for you. You should receive a phone call from the specialty office. Make sure your voicemail is not full and that if you are able to answer your phone to unknown or new numbers.   It may take up to 2 weeks to hear about the referral. If you do not hear anything in 2 weeks, please call our office and ask to speak with the referral coordinator.

## 2020-03-20 NOTE — Assessment & Plan Note (Signed)
Stable per patient. Cont duloxetine and wellbutrin

## 2020-03-20 NOTE — Assessment & Plan Note (Signed)
Occasional need for revision and notes some current tightening of skin. Referral to plastics

## 2020-03-20 NOTE — Assessment & Plan Note (Signed)
BP at goal. Continue metoprolol, lasix, amlodipine

## 2020-03-20 NOTE — Assessment & Plan Note (Signed)
Obtain outside labs. Cont levothyroxine.

## 2020-03-20 NOTE — Assessment & Plan Note (Signed)
Pigmented skin macular lesions which occasional bleed and come and go w/o intervention. Advised topical steroid and if any remaining constant to return for re-evaluation

## 2020-03-20 NOTE — Assessment & Plan Note (Signed)
Stable. Cont trazodone
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
## 2020-03-21 ENCOUNTER — Encounter: Payer: Self-pay | Admitting: Family Medicine

## 2020-03-21 ENCOUNTER — Telehealth: Payer: Self-pay

## 2020-03-21 NOTE — Telephone Encounter (Signed)
Pt left message on triage line, called pt back he states that he is unsure if he can wait until Friday for appt. Advised pt we could see him tomorrow however pt states he as jury duty. Advised pt to call us back first thing tomorrow morning if he does not have to go in for jury duty so that we can add him on for tomorrow. Pt gave verbal understanding.

## 2020-03-21 NOTE — Telephone Encounter (Signed)
Spoke with patient and scheduled appt with PA

## 2020-03-22 ENCOUNTER — Encounter: Payer: Self-pay | Admitting: Physician Assistant

## 2020-03-22 ENCOUNTER — Other Ambulatory Visit: Payer: Self-pay

## 2020-03-22 ENCOUNTER — Ambulatory Visit (INDEPENDENT_AMBULATORY_CARE_PROVIDER_SITE_OTHER): Payer: Managed Care, Other (non HMO) | Admitting: Physician Assistant

## 2020-03-22 VITALS — BP 123/84 | HR 57 | Ht 71.0 in | Wt 307.0 lb

## 2020-03-22 DIAGNOSIS — N401 Enlarged prostate with lower urinary tract symptoms: Secondary | ICD-10-CM

## 2020-03-22 DIAGNOSIS — R300 Dysuria: Secondary | ICD-10-CM

## 2020-03-22 LAB — BLADDER SCAN AMB NON-IMAGING

## 2020-03-22 MED ORDER — SULFAMETHOXAZOLE-TRIMETHOPRIM 800-160 MG PO TABS: 1.0000 | ORAL_TABLET | Freq: Two times a day (BID) | ORAL | 0 refills | Status: DC
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
## 2020-03-23 LAB — URINALYSIS, COMPLETE
Bilirubin, UA: NEGATIVE
Glucose, UA: NEGATIVE
Ketones, UA: NEGATIVE
Nitrite, UA: NEGATIVE
Specific Gravity, UA: 1.02 (ref 1.005–1.030)
Urobilinogen, Ur: 0.2 mg/dL (ref 0.2–1.0)
pH, UA: 6 (ref 5.0–7.5)

## 2020-03-23 LAB — MICROSCOPIC EXAMINATION
RBC, Urine: >30 /hpf — AB (ref 0–2)
WBC, UA: >30 /hpf — AB (ref 0–5)

## 2020-03-24 ENCOUNTER — Ambulatory Visit: Payer: Self-pay | Admitting: Physician Assistant

## 2020-03-25 LAB — CULTURE, URINE COMPREHENSIVE

## 2020-03-27 ENCOUNTER — Other Ambulatory Visit: Payer: Self-pay | Admitting: *Deleted

## 2020-03-27 ENCOUNTER — Other Ambulatory Visit: Payer: Self-pay

## 2020-03-27 ENCOUNTER — Telehealth: Payer: Self-pay | Admitting: Physician Assistant

## 2020-03-27 ENCOUNTER — Ambulatory Visit (INDEPENDENT_AMBULATORY_CARE_PROVIDER_SITE_OTHER): Payer: Managed Care, Other (non HMO) | Admitting: Physician Assistant

## 2020-03-27 ENCOUNTER — Encounter: Payer: Self-pay | Admitting: Physician Assistant

## 2020-03-27 VITALS — BP 130/83 | HR 64 | Ht 71.0 in | Wt 314.0 lb

## 2020-03-27 DIAGNOSIS — R300 Dysuria: Secondary | ICD-10-CM

## 2020-03-27 LAB — URINALYSIS, COMPLETE
Bilirubin, UA: NEGATIVE
Glucose, UA: NEGATIVE
Ketones, UA: NEGATIVE
Nitrite, UA: NEGATIVE
Specific Gravity, UA: >1.03 — ABNORMAL HIGH (ref 1.005–1.030)
Urobilinogen, Ur: 1 mg/dL (ref 0.2–1.0)
pH, UA: 6 (ref 5.0–7.5)

## 2020-03-27 LAB — MICROSCOPIC EXAMINATION
RBC, Urine: >30 /hpf — AB (ref 0–2)
WBC, UA: >30 /hpf — AB (ref 0–5)

## 2020-03-27 NOTE — Patient Instructions (Addendum)
Stop Bactrim.  Dr. Diamantina Providence will see you tomorrow for a cystoscopy.   Cystoscopy Cystoscopy is a procedure that is used to help diagnose and sometimes treat conditions that affect the lower urinary tract. The lower urinary tract includes the bladder and the urethra. The urethra is the tube that drains urine from the bladder. Cystoscopy is done using a thin, tube-shaped instrument with a light and camera at the end (cystoscope). The cystoscope may be hard or flexible, depending on the goal of the procedure. The cystoscope is inserted through the urethra, into the bladder. Cystoscopy may be recommended if you have:  Urinary tract infections that keep coming back.  Blood in the urine (hematuria).  An inability to control when you urinate (urinary incontinence) or an overactive bladder.  Unusual cells found in a urine sample.  A blockage in the urethra, such as a urinary stone.  Painful urination.  An abnormality in the bladder found during an intravenous pyelogram (IVP) or CT scan. Cystoscopy may also be done to remove a sample of tissue to be examined under a microscope (biopsy). Tell a health care provider about:  Any allergies you have.  All medicines you are taking, including vitamins, herbs, eye drops, creams, and over-the-counter medicines.  Any problems you or family members have had with anesthetic medicines.  Any blood disorders you have.  Any surgeries you have had.  Any medical conditions you have.  Whether you are pregnant or may be pregnant. What are the risks? Generally, this is a safe procedure. However, problems may occur, including:  Infection.  Bleeding.  Allergic reactions to medicines.  Damage to other structures or organs. What happens before the procedure?  Ask your health care provider about: ? Changing or stopping your regular medicines. This is especially important if you are taking diabetes medicines or blood thinners. ? Taking medicines such  as aspirin and ibuprofen. These medicines can thin your blood. Do not take these medicines unless your health care provider tells you to take them. ? Taking over-the-counter medicines, vitamins, herbs, and supplements.  Follow instructions from your health care provider about eating or drinking restrictions.  Ask your health care provider what steps will be taken to help prevent infection. These may include: ? Washing skin with a germ-killing soap. ? Taking antibiotic medicine.  You may have an exam or testing, such as: ? X-rays of the bladder, urethra, or kidneys. ? Urine tests to check for signs of infection.  Plan to have someone take you home from the hospital or clinic. What happens during the procedure?   You will be given one or more of the following: ? A medicine to help you relax (sedative). ? A medicine to numb the area (local anesthetic).  The area around the opening of your urethra will be cleaned.  The cystoscope will be passed through your urethra into your bladder.  Germ-free (sterile) fluid will flow through the cystoscope to fill your bladder. The fluid will stretch your bladder so that your health care provider can clearly examine your bladder walls.  Your doctor will look at the urethra and bladder. Your doctor may take a biopsy or remove stones.  The cystoscope will be removed, and your bladder will be emptied. The procedure may vary among health care providers and hospitals. What can I expect after the procedure? After the procedure, it is common to have:  Some soreness or pain in your abdomen and urethra.  Urinary symptoms. These include: ? Mild pain or burning  when you urinate. Pain should stop within a few minutes after you urinate. This may last for up to 1 week. ? A small amount of blood in your urine for several days. ? Feeling like you need to urinate but producing only a small amount of urine. Follow these instructions at home: Medicines  Take  over-the-counter and prescription medicines only as told by your health care provider.  If you were prescribed an antibiotic medicine, take it as told by your health care provider. Do not stop taking the antibiotic even if you start to feel better. General instructions  Return to your normal activities as told by your health care provider. Ask your health care provider what activities are safe for you.  Do not drive for 24 hours if you were given a sedative during your procedure.  Watch for any blood in your urine. If the amount of blood in your urine increases, call your health care provider.  Follow instructions from your health care provider about eating or drinking restrictions.  If a tissue sample was removed for testing (biopsy) during your procedure, it is up to you to get your test results. Ask your health care provider, or the department that is doing the test, when your results will be ready.  Drink enough fluid to keep your urine pale yellow.  Keep all follow-up visits as told by your health care provider. This is important. Contact a health care provider if you:  Have pain that gets worse or does not get better with medicine, especially pain when you urinate.  Have trouble urinating.  Have more blood in your urine. Get help right away if you:  Have blood clots in your urine.  Have abdominal pain.  Have a fever or chills.  Are unable to urinate. Summary  Cystoscopy is a procedure that is used to help diagnose and sometimes treat conditions that affect the lower urinary tract.  Cystoscopy is done using a thin, tube-shaped instrument with a light and camera at the end.  After the procedure, it is common to have some soreness or pain in your abdomen and urethra.  Watch for any blood in your urine. If the amount of blood in your urine increases, call your health care provider.  If you were prescribed an antibiotic medicine, take it as told by your health care  provider. Do not stop taking the antibiotic even if you start to feel better. This information is not intended to replace advice given to you by your health care provider. Make sure you discuss any questions you have with your health care provider. Document Revised: 08/04/2018 Document Reviewed: 08/04/2018 Elsevier Patient Education  [place].
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
## 2020-03-27 NOTE — Telephone Encounter (Signed)
Called pt informed him of the information below, pt gave verbal understanding. Appt moved.

## 2020-03-27 NOTE — Telephone Encounter (Signed)
Please contact the patient and inform him that I had a chance to speak with Dr. Diamantina Providence today.  Dr. Diamantina Providence requests that we move the patient's cystoscopy appointment to this Wednesday or Thursday in [place], as he is better equipped at this location to address any significant findings requiring intervention.

## 2020-03-28 ENCOUNTER — Other Ambulatory Visit: Payer: Self-pay | Admitting: Urology

## 2020-03-29 ENCOUNTER — Ambulatory Visit (INDEPENDENT_AMBULATORY_CARE_PROVIDER_SITE_OTHER): Payer: Managed Care, Other (non HMO) | Admitting: Urology

## 2020-03-29 ENCOUNTER — Encounter: Payer: Self-pay | Admitting: Urology

## 2020-03-29 ENCOUNTER — Other Ambulatory Visit: Payer: Self-pay

## 2020-03-29 VITALS — BP 113/74 | HR 64 | Ht 71.0 in | Wt 314.0 lb

## 2020-03-29 DIAGNOSIS — R300 Dysuria: Secondary | ICD-10-CM | POA: Diagnosis not present

## 2020-03-29 MED ORDER — LIDOCAINE HCL URETHRAL/MUCOSAL 2 % EX GEL
1.0000 "application " | Freq: Once | CUTANEOUS | Status: AC
  Administered 2020-03-29: 1 via URETHRAL

## 2020-03-29 NOTE — Progress Notes (Signed)
Cystoscopy Procedure Note:  Indication: s/p HoLEP 7/2 with removal of 67g tissue, persistent dysuria despite negative urine cultures  After informed consent and discussion of the procedure and its risks, [name] was positioned and prepped in the standard fashion.  There was a tight fossa navicularis stricture that I was able to dilate using a curved hemostat.  Cystoscopy was then performed and showed a normal-appearing urethra proximally, some fibrinous tissue in the prostatic fossa, but a widely patent channel into the bladder.  The bladder was grossly normal.  Findings: Fossa navicularis stricture  Assessment and Plan: Patient to perform self urethral dilations of the distal 4 cm of the ureter using a provided Coloplast catheter daily for the next month.  Keep scheduled follow-up in early August for symptom check.  We discussed possible need for DVIU in the future.  Okay to use Pyridium as needed for the next few days for dysuria.  Kevin Madrid, MD 03/29/2020

## 2020-03-30 ENCOUNTER — Encounter: Payer: Self-pay | Admitting: Family Medicine

## 2020-04-06 ENCOUNTER — Other Ambulatory Visit: Payer: Self-pay | Admitting: Family Medicine

## 2020-04-07 NOTE — Telephone Encounter (Signed)
Patient is requesting a refill on Gabapentin. This medication has been previously prescribed by another provider.  Last office visit- 03-20-2020

## 2020-04-12 ENCOUNTER — Encounter: Payer: Self-pay | Admitting: Plastic Surgery

## 2020-04-12 ENCOUNTER — Ambulatory Visit: Payer: Managed Care, Other (non HMO) | Admitting: Plastic Surgery

## 2020-04-12 ENCOUNTER — Other Ambulatory Visit: Payer: Self-pay

## 2020-04-12 VITALS — BP 135/72 | HR 61 | Temp 98.6°F | Ht 71.0 in | Wt 311.0 lb

## 2020-04-12 DIAGNOSIS — L905 Scar conditions and fibrosis of skin: Secondary | ICD-10-CM | POA: Diagnosis not present
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
## 2020-04-13 ENCOUNTER — Ambulatory Visit: Payer: Managed Care, Other (non HMO) | Admitting: Urology

## 2020-04-26 ENCOUNTER — Ambulatory Visit (INDEPENDENT_AMBULATORY_CARE_PROVIDER_SITE_OTHER): Payer: Managed Care, Other (non HMO) | Admitting: Urology

## 2020-04-26 ENCOUNTER — Encounter: Payer: Self-pay | Admitting: Urology

## 2020-04-26 ENCOUNTER — Other Ambulatory Visit: Payer: Self-pay

## 2020-04-26 VITALS — BP 143/76 | HR 58 | Ht 71.0 in | Wt 307.7 lb

## 2020-04-26 DIAGNOSIS — N401 Enlarged prostate with lower urinary tract symptoms: Secondary | ICD-10-CM

## 2020-04-26 LAB — BLADDER SCAN AMB NON-IMAGING

## 2020-04-26 NOTE — Progress Notes (Signed)
° °  04/26/2020 1:20 PM   [name] Mar 23, 1956 [id number]  Reason for visit: Follow up HOLEP  HPI: I saw [name] back in urology clinic for follow-up after HOLEP.  He underwent an uncomplicated HOLEP on 03/03/2955 with removal of 67 g of benign tissue for BPH for severe symptoms of weak stream, feeling of incomplete emptying, urgency, nocturia, and gross hematuria.  He noted some dysuria and weak stream postop and presented on 03/29/2020 and cystoscopy showed a tight fossa navicularis stricture that I dilated with a hemostat.  He has continued self dilations of the distal urethra every 1 to 2 days.  He reports complete resolution of his prior dysuria and urinary symptoms.  He continues to have some persistent urgency and frequency, but notes that this is improved significantly over the last few weeks.  He has nocturia 1-2 times at night.  He is still having some mild leakage with sudden movements, coughing/sneezing, and occasionally some urgency.  IPSS score today is 9, with quality of life mixed.  PVR is normal at 19 mL.  Urinalysis today is pending.  We again reviewed expectations after HOLEP, and he continues to improve.  I recommended continuing self dilations for his fossa navicularis stricture a few times a week to keep this open, avoiding bladder irritants, and Kegel exercises.  RTC 3 months with PVR, IPSS, symptom check  Billey Co, MD  [facility] [address] [place], [place] [address] [phone]

## 2020-04-26 NOTE — Patient Instructions (Addendum)
Avoid bladder irritants like diet sodas, soda, coffee, tea, and carbonated drinks.  Minimize fluids within a few hours of bedtime and urinate before bed.  Continue passing the catheter to dilate the end part of the urethra a few times a week.   Kegel Exercises  Kegel exercises can help strengthen your pelvic floor muscles. The pelvic floor is a group of muscles that support your rectum, small intestine, and bladder. In females, pelvic floor muscles also help support the womb (uterus). These muscles help you control the flow of urine and stool. Kegel exercises are painless and simple, and they do not require any equipment. Your provider may suggest Kegel exercises to:  Improve bladder and bowel control.  Improve sexual response.  Improve weak pelvic floor muscles after surgery to remove the uterus (hysterectomy) or pregnancy (females).  Improve weak pelvic floor muscles after prostate gland removal or surgery (males). Kegel exercises involve squeezing your pelvic floor muscles, which are the same muscles you squeeze when you try to stop the flow of urine or keep from passing gas. The exercises can be done while sitting, standing, or lying down, but it is best to vary your position. Exercises How to do Kegel exercises: 1. Squeeze your pelvic floor muscles tight. You should feel a tight lift in your rectal area. If you are a male, you should also feel a tightness in your vaginal area. Keep your stomach, buttocks, and legs relaxed. 2. Hold the muscles tight for up to 10 seconds. 3. Breathe normally. 4. Relax your muscles. 5. Repeat as told by your health care provider. Repeat this exercise daily as told by your health care provider. Continue to do this exercise for at least 4-6 weeks, or for as long as told by your health care provider. You may be referred to a physical therapist who can help you learn more about how to do Kegel exercises. Depending on your condition, your health care provider  may recommend:  Varying how long you squeeze your muscles.  Doing several sets of exercises every day.  Doing exercises for several weeks.  Making Kegel exercises a part of your regular exercise routine. This information is not intended to replace advice given to you by your health care provider. Make sure you discuss any questions you have with your health care provider. Document Revised: 04/01/2018 Document Reviewed: 04/01/2018 Elsevier Patient Education  [place].

## 2020-04-27 LAB — URINALYSIS, COMPLETE
Bilirubin, UA: NEGATIVE
Glucose, UA: NEGATIVE
Ketones, UA: NEGATIVE
Nitrite, UA: NEGATIVE
Protein,UA: NEGATIVE
Specific Gravity, UA: 1.01 (ref 1.005–1.030)
Urobilinogen, Ur: 0.2 mg/dL (ref 0.2–1.0)
pH, UA: 7 (ref 5.0–7.5)

## 2020-04-27 LAB — MICROSCOPIC EXAMINATION: Bacteria, UA: NONE SEEN

## 2020-05-10 ENCOUNTER — Encounter: Payer: Self-pay | Admitting: Plastic Surgery

## 2020-05-11 ENCOUNTER — Encounter: Payer: Self-pay | Admitting: Surgical

## 2020-05-11 ENCOUNTER — Other Ambulatory Visit: Payer: Self-pay

## 2020-05-11 ENCOUNTER — Ambulatory Visit: Payer: Managed Care, Other (non HMO) | Admitting: Surgical

## 2020-05-11 VITALS — BP 113/71 | HR 65 | Temp 97.7°F

## 2020-05-11 DIAGNOSIS — L905 Scar conditions and fibrosis of skin: Secondary | ICD-10-CM

## 2020-05-11 NOTE — Progress Notes (Addendum)
   Subjective:     Patient ID: [name], male    DOB: [birth year], 64 y.o.   MRN: [id number]  Chief Complaint  Patient presents with  . Follow-up    HPI: The patient is a 64 y.o. male here for follow-up on his bilateral axillary burn scar contracture and anterior neck burn scar contracture.  Patient was seen in consultation by Dr. Claudia Desanctis on 04/12/2020 and is scheduled for release of bilateral axillary burn scar contracture with Z-plasty's and release of anterior neck burn contracture with full-thickness skin graft on 06/27/2020.  Patient reports that he is having some tenderness and burning pain on his right axillary region where he has had a chronic wound for 3 months.  He reports it is very tender with movement of his arm, this involves driving, lifting his arm in any manner.  He is not having any fevers or chills or nausea or vomiting.  He has not taken any medications for pain control.   Review of Systems  Constitutional: Negative for chills and fever.  Gastrointestinal: Negative for nausea and vomiting.  Skin: Positive for wound.     Objective:   Vital Signs BP 113/71 (BP Location: Left Arm, Patient Position: Sitting, Cuff Size: Large)   Pulse 65   Temp 97.7 F (36.5 C) (Oral)   SpO2 96%  Vital Signs and Nursing Note Reviewed Physical Exam Constitutional:      General: He is not in acute distress.    Appearance: He is not ill-appearing.     Comments: Well-developed well-nourished pleasant male  Pulmonary:     Effort: Pulmonary effort is normal.  Skin:    General: Skin is warm and dry.       Neurological:     Mental Status: He is alert.  Psychiatric:        Mood and Affect: Mood normal.        Behavior: Behavior normal.     Assessment/Plan:     ICD-10-CM   1. Burn scar contracture of multiple sites  L90.5    On exam, patient has a ulceration which has been chronic for approximately 3 months of the right lateral chest/mid axillary line.  He reports pain with  abduction of his right arm or any movement including driving.  I discussed with the patient I recommend Vaseline daily to help soften up the scabbing and ulceration along with Tylenol 500 mg every 6 to 8 hours and ibuprofen 400 to 600 mg every 8 hours.  Discussed with patient this should help manage pain control.  I recommend he call with any questions or concerns or if symptoms worsen, I discussed with him that this does not appear infected.  Patient has a follow-up appointment scheduled for 06/07/2020 for reevaluation.  Pictures were obtained of the patient and placed in the chart with the patient's or guardian's permission.    Carola Rhine Noha Karasik, PA-C 05/11/2020, 2:07 PM

## 2020-05-24 ENCOUNTER — Other Ambulatory Visit: Payer: Self-pay

## 2020-05-24 ENCOUNTER — Telehealth (INDEPENDENT_AMBULATORY_CARE_PROVIDER_SITE_OTHER): Payer: Managed Care, Other (non HMO) | Admitting: Family Medicine

## 2020-05-24 DIAGNOSIS — J40 Bronchitis, not specified as acute or chronic: Secondary | ICD-10-CM

## 2020-05-24 MED ORDER — GUAIFENESIN-CODEINE 100-10 MG/5ML PO SOLN: 5.0000 mL | Freq: Three times a day (TID) | ORAL | 0 refills | Status: DC | PRN

## 2020-05-24 MED ORDER — AZITHROMYCIN 250 MG PO TABS
ORAL_TABLET | ORAL | 0 refills | Status: DC
Start: 2020-05-24 — End: 2020-06-19

## 2020-05-24 NOTE — Patient Instructions (Signed)
Based on your symptoms, it looks like you have a virus.   Antibiotics are not need for a viral infection but the following will help:   1. Drink plenty of fluids 2. Get lots of rest  Sinus Congestion 1) Neti Pot (Saline rinse) -- 2 times day -- if tolerated 2) Flonase (Store Brand ok) - once daily 3) Over the counter congestion medications  Cough 1) Cough drops can be helpful 2) Nyquil (or nighttime cough medication) 3) Honey is proven to be one of the best cough medications  4) Cough medicine with Dextromethorphan can also be helpful  Sore Throat 1) Honey as above, cough drops 2) Ibuprofen or Aleve can be helpful 3) Salt water Gargles   Can take the antibiotic and cough syrup at night

## 2020-05-24 NOTE — Progress Notes (Signed)
I connected with [name] on 05/24/20 at 10:40 AM EDT by video and verified that I am speaking with the correct person using two identifiers.   I discussed the limitations, risks, security and privacy concerns of performing an evaluation and management service by video and the availability of in person appointments. I also discussed with the patient that there may be a patient responsible charge related to this service. The patient expressed understanding and agreed to proceed.  Patient location: Home Provider Location: [place] Participants: [name] and [name]   Subjective:     [name] is a 64 y.o. male presenting for sinus drainage, Sore Throat, and Cough     URI  This is a new problem. The current episode started 1 to 4 weeks ago. The problem has been unchanged. There has been no fever. Associated symptoms include congestion, coughing, rhinorrhea, sinus pain and a sore throat. Pertinent negatives include no diarrhea, ear pain, nausea, plugged ear sensation, sneezing or vomiting. Treatments tried: allegra, delsum for cough. The treatment provided moderate relief.   Started when the ragweed started Usually goes away in 2 weeks and this time it seems to be hanging around In the past this has progress to bronchitis Clear mucus  Has been using flonase  Sick contact: no No loss of taste or smell   Review of Systems  HENT: Positive for congestion, rhinorrhea, sinus pain and sore throat. Negative for ear pain and sneezing.   Respiratory: Positive for cough.   Gastrointestinal: Negative for diarrhea, nausea and vomiting.     Social History   Tobacco Use  Smoking Status Former Smoker  . Packs/day: 0.50  . Years: 13.00  . Pack years: 6.50  . Types: Cigarettes  . Quit date: 06/15/1985  . Years since quitting: 34.9  Smokeless Tobacco Never Used        Objective:   BP Readings from Last 3 Encounters:  05/11/20 113/71  04/26/20 (!)  143/76  04/12/20 135/72   Wt Readings from Last 3 Encounters:  04/26/20 (!) 307 lb 11.2 oz (139.6 kg)  04/12/20 (!) 311 lb (141.1 kg)  03/29/20 (!) 314 lb (142.4 kg)    There were no vitals taken for this visit.   Physical Exam Constitutional:      Appearance: Normal appearance. He is not ill-appearing.  HENT:     Head: Normocephalic and atraumatic.     Right Ear: External ear normal.     Left Ear: External ear normal.  Eyes:     Conjunctiva/sclera: Conjunctivae normal.  Pulmonary:     Effort: Pulmonary effort is normal. No respiratory distress.  Skin:    Comments: Scaring from skin grafts on face  Neurological:     Mental Status: He is alert. Mental status is at baseline.  Psychiatric:        Mood and Affect: Mood normal.        Behavior: Behavior normal.        Thought Content: Thought content normal.        Judgment: Judgment normal.            Assessment & Plan:   Problem List Items Addressed This Visit    None    Visit Diagnoses    Bronchitis    -  Primary     Suspect transitioning to sinus infection/bronchitis Advised trial of saline rinse  Pt reports in the past responded well to azithromycin and codeine cough medication - both  prescribed  Cont allergy treatment Given onset of symptoms do not think covid testing necessary and pt vaccinated   Return if symptoms worsen or fail to improve.  [name], MD

## 2020-06-06 DIAGNOSIS — L905 Scar conditions and fibrosis of skin: Secondary | ICD-10-CM | POA: Insufficient documentation
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
## 2020-06-07 ENCOUNTER — Telehealth: Payer: Self-pay

## 2020-06-07 ENCOUNTER — Ambulatory Visit (INDEPENDENT_AMBULATORY_CARE_PROVIDER_SITE_OTHER): Payer: Managed Care, Other (non HMO) | Admitting: Plastic Surgery

## 2020-06-07 ENCOUNTER — Encounter: Payer: Self-pay | Admitting: Plastic Surgery

## 2020-06-07 ENCOUNTER — Other Ambulatory Visit: Payer: Self-pay

## 2020-06-07 VITALS — BP 136/83 | HR 66 | Temp 97.7°F | Ht 71.0 in | Wt 313.4 lb

## 2020-06-07 DIAGNOSIS — L905 Scar conditions and fibrosis of skin: Secondary | ICD-10-CM

## 2020-06-07 MED ORDER — HYDROCODONE-ACETAMINOPHEN 5-325 MG PO TABS: 1.0000 | ORAL_TABLET | Freq: Three times a day (TID) | ORAL | 0 refills | Status: AC | PRN

## 2020-06-07 NOTE — Telephone Encounter (Signed)
Primary Cardiologist:Brian Stanford Breed, MD  Chart reviewed as part of pre-operative protocol coverage. Because of [name]'s past medical history and time since last visit, he/she will require a follow-up visit in order to better assess preoperative cardiovascular risk.  Pre-op covering staff: - Please schedule appointment and call patient to inform them. - Please contact requesting surgeon's office via preferred method (i.e, phone, fax) to inform them of need for appointment prior to surgery.  If applicable, this message will also be routed to pharmacy pool and/or primary cardiologist for input on holding anticoagulant/antiplatelet agent as requested below so that this information is available at time of patient's appointment.   Deberah Pelton, NP  06/07/2020, 10:38 AM

## 2020-06-07 NOTE — Telephone Encounter (Signed)
Pt has appt with Dr. Stanford Breed 06/19/20 with get pre op assessment at ov . Will forward clearance notes to MD for upcoming appt. Will send FYI to requesting office pt has appt with cardiologist 06/19/20. Will remove from the pre op call ba ck pool.
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
## 2020-06-14 ENCOUNTER — Encounter: Payer: Managed Care, Other (non HMO) | Admitting: Plastic Surgery

## 2020-06-19 ENCOUNTER — Other Ambulatory Visit: Payer: Self-pay | Admitting: *Deleted

## 2020-06-19 ENCOUNTER — Ambulatory Visit: Payer: Managed Care, Other (non HMO) | Admitting: Cardiology

## 2020-06-19 ENCOUNTER — Other Ambulatory Visit: Payer: Self-pay

## 2020-06-19 ENCOUNTER — Encounter: Payer: Self-pay | Admitting: Cardiology

## 2020-06-19 ENCOUNTER — Encounter (HOSPITAL_BASED_OUTPATIENT_CLINIC_OR_DEPARTMENT_OTHER): Payer: Self-pay | Admitting: Plastic Surgery

## 2020-06-19 VITALS — BP 116/72 | HR 60 | Ht 71.0 in | Wt 310.6 lb

## 2020-06-19 DIAGNOSIS — I10 Essential (primary) hypertension: Secondary | ICD-10-CM | POA: Diagnosis not present

## 2020-06-19 DIAGNOSIS — I2510 Atherosclerotic heart disease of native coronary artery without angina pectoris: Secondary | ICD-10-CM | POA: Diagnosis not present

## 2020-06-19 DIAGNOSIS — Z01810 Encounter for preprocedural cardiovascular examination: Secondary | ICD-10-CM | POA: Diagnosis not present

## 2020-06-19 MED ORDER — FUROSEMIDE 20 MG PO TABS
20.0000 mg | ORAL_TABLET | Freq: Every day | ORAL | 3 refills | Status: DC
Start: 2020-06-19 — End: 2021-05-08

## 2020-06-19 MED ORDER — METOPROLOL SUCCINATE ER 25 MG PO TB24
25.0000 mg | ORAL_TABLET | Freq: Every day | ORAL | 3 refills | Status: DC
Start: 2020-06-19 — End: 2021-08-06

## 2020-06-19 MED ORDER — AMLODIPINE BESYLATE 5 MG PO TABS
5.0000 mg | ORAL_TABLET | Freq: Every morning | ORAL | 3 refills | Status: DC
Start: 2020-06-19 — End: 2021-03-01

## 2020-06-19 MED ORDER — ATORVASTATIN CALCIUM 40 MG PO TABS: 40.0000 mg | ORAL_TABLET | Freq: Every day | ORAL | 3 refills | Status: DC

## 2020-06-19 NOTE — Patient Instructions (Signed)

## 2020-06-23 ENCOUNTER — Encounter (HOSPITAL_BASED_OUTPATIENT_CLINIC_OR_DEPARTMENT_OTHER)
Admission: RE | Admit: 2020-06-23 | Discharge: 2020-06-23 | Disposition: A | Discharge status: Home / Self Care | Payer: Managed Care, Other (non HMO) | Source: Ambulatory Visit | Attending: Plastic Surgery | Admitting: Plastic Surgery

## 2020-06-23 ENCOUNTER — Other Ambulatory Visit (HOSPITAL_COMMUNITY)
Admission: RE | Admit: 2020-06-23 | Discharge: 2020-06-23 | Disposition: A | Discharge status: Home / Self Care | Payer: Managed Care, Other (non HMO) | Source: Ambulatory Visit | Attending: Plastic Surgery | Admitting: Plastic Surgery

## 2020-06-23 DIAGNOSIS — Z01812 Encounter for preprocedural laboratory examination: Secondary | ICD-10-CM | POA: Insufficient documentation

## 2020-06-23 DIAGNOSIS — Z20822 Contact with and (suspected) exposure to covid-19: Secondary | ICD-10-CM | POA: Insufficient documentation

## 2020-06-23 LAB — BASIC METABOLIC PANEL
Anion gap: 9 (ref 5–15)
BUN: 19 mg/dL (ref 8–23)
CO2: 25 mmol/L (ref 22–32)
Calcium: 9 mg/dL (ref 8.9–10.3)
Chloride: 106 mmol/L (ref 98–111)
Creatinine, Ser: 1.13 mg/dL (ref 0.61–1.24)
GFR, Estimated: >60 mL/min (ref >60)
Glucose, Bld: 93 mg/dL (ref 70–99)
Potassium: 4.3 mmol/L (ref 3.5–5.1)
Sodium: 140 mmol/L (ref 135–145)

## 2020-06-23 LAB — SARS CORONAVIRUS 2 (TAT 6-24 HRS): SARS Coronavirus 2: NEGATIVE

## 2020-06-24 ENCOUNTER — Other Ambulatory Visit (HOSPITAL_COMMUNITY): Payer: Managed Care, Other (non HMO)

## 2020-06-27 ENCOUNTER — Ambulatory Visit (HOSPITAL_BASED_OUTPATIENT_CLINIC_OR_DEPARTMENT_OTHER)
Admission: RE | Admit: 2020-06-27 | Discharge: 2020-06-27 | Disposition: A | Discharge status: Home / Self Care | Payer: Managed Care, Other (non HMO) | Attending: Plastic Surgery | Admitting: Plastic Surgery

## 2020-06-27 ENCOUNTER — Other Ambulatory Visit: Payer: Self-pay

## 2020-06-27 ENCOUNTER — Ambulatory Visit (HOSPITAL_BASED_OUTPATIENT_CLINIC_OR_DEPARTMENT_OTHER): Payer: Managed Care, Other (non HMO) | Admitting: Anesthesiology

## 2020-06-27 ENCOUNTER — Encounter (HOSPITAL_BASED_OUTPATIENT_CLINIC_OR_DEPARTMENT_OTHER): Payer: Self-pay | Admitting: Plastic Surgery

## 2020-06-27 ENCOUNTER — Encounter (HOSPITAL_BASED_OUTPATIENT_CLINIC_OR_DEPARTMENT_OTHER)
Admission: RE | Disposition: A | Discharge status: Home / Self Care | Payer: Self-pay | Source: Home / Self Care | Attending: Plastic Surgery

## 2020-06-27 DIAGNOSIS — Z87891 Personal history of nicotine dependence: Secondary | ICD-10-CM | POA: Insufficient documentation

## 2020-06-27 DIAGNOSIS — L905 Scar conditions and fibrosis of skin: Secondary | ICD-10-CM

## 2020-06-27 DIAGNOSIS — X088XXA Exposure to other specified smoke, fire and flames, initial encounter: Secondary | ICD-10-CM | POA: Diagnosis not present

## 2020-06-27 DIAGNOSIS — T2007XS Burn of unspecified degree of neck, sequela: Secondary | ICD-10-CM | POA: Diagnosis not present

## 2020-06-27 HISTORY — DX: Atherosclerotic heart disease of native coronary artery without angina pectoris: I25.10

## 2020-06-27 HISTORY — DX: Gastro-esophageal reflux disease without esophagitis: K21.9

## 2020-06-27 HISTORY — PX: Z-PLASTY SCAR REVISION: SHX6192

## 2020-06-27 HISTORY — DX: Anxiety disorder, unspecified: F41.9

## 2020-06-27 HISTORY — PX: SKIN FULL THICKNESS GRAFT: SHX442

## 2020-06-27 SURGERY — REVISION, SCAR, USING Z-PLASTY TECHNIQUE
Anesthesia: General | Site: Neck

## 2020-06-27 MED ORDER — SUGAMMADEX SODIUM 500 MG/5ML IV SOLN
INTRAVENOUS | Status: AC
  Filled 2020-06-27: qty 5

## 2020-06-27 MED ORDER — PROMETHAZINE HCL 25 MG/ML IJ SOLN: 6.2500 mg | INTRAMUSCULAR | Status: DC | PRN

## 2020-06-27 MED ORDER — ONDANSETRON HCL 4 MG/2ML IJ SOLN
INTRAMUSCULAR | Status: DC | PRN
  Administered 2020-06-27: 4 mg via INTRAVENOUS

## 2020-06-27 MED ORDER — CLINDAMYCIN PHOSPHATE 900 MG/50ML IV SOLN
INTRAVENOUS | Status: AC
  Filled 2020-06-27: qty 50

## 2020-06-27 MED ORDER — HYDROMORPHONE HCL 1 MG/ML IJ SOLN
0.2500 mg | INTRAMUSCULAR | Status: DC | PRN
  Administered 2020-06-27 (×2): 0.5 mg via INTRAVENOUS

## 2020-06-27 MED ORDER — CLINDAMYCIN PHOSPHATE 900 MG/50ML IV SOLN
900.0000 mg | INTRAVENOUS | Status: AC
  Administered 2020-06-27: 900 mg via INTRAVENOUS

## 2020-06-27 MED ORDER — LACTATED RINGERS IV SOLN: INTRAVENOUS | Status: DC

## 2020-06-27 MED ORDER — PROPOFOL 10 MG/ML IV BOLUS
INTRAVENOUS | Status: AC
  Filled 2020-06-27: qty 20

## 2020-06-27 MED ORDER — DEXAMETHASONE SODIUM PHOSPHATE 4 MG/ML IJ SOLN
INTRAMUSCULAR | Status: DC | PRN
  Administered 2020-06-27: 10 mg via INTRAVENOUS

## 2020-06-27 MED ORDER — MIDAZOLAM HCL 2 MG/2ML IJ SOLN
INTRAMUSCULAR | Status: AC
  Filled 2020-06-27: qty 2

## 2020-06-27 MED ORDER — HYDROMORPHONE HCL 1 MG/ML IJ SOLN
INTRAMUSCULAR | Status: AC
  Filled 2020-06-27: qty 0.5

## 2020-06-27 MED ORDER — BUPIVACAINE HCL (PF) 0.25 % IJ SOLN
INTRAMUSCULAR | Status: AC
  Filled 2020-06-27: qty 30

## 2020-06-27 MED ORDER — AMISULPRIDE (ANTIEMETIC) 5 MG/2ML IV SOLN: 10.0000 mg | Freq: Once | INTRAVENOUS | Status: DC | PRN

## 2020-06-27 MED ORDER — FENTANYL CITRATE (PF) 100 MCG/2ML IJ SOLN
INTRAMUSCULAR | Status: DC | PRN
  Administered 2020-06-27: 100 ug via INTRAVENOUS

## 2020-06-27 MED ORDER — BUPIVACAINE-EPINEPHRINE (PF) 0.25% -1:200000 IJ SOLN
INTRAMUSCULAR | Status: AC
  Filled 2020-06-27: qty 30

## 2020-06-27 MED ORDER — BACITRACIN ZINC 500 UNIT/GM EX OINT
TOPICAL_OINTMENT | CUTANEOUS | Status: AC
  Filled 2020-06-27: qty 4.5

## 2020-06-27 MED ORDER — PROPOFOL 10 MG/ML IV BOLUS
INTRAVENOUS | Status: DC | PRN
  Administered 2020-06-27: 200 mg via INTRAVENOUS

## 2020-06-27 MED ORDER — OXYCODONE HCL 5 MG PO TABS
ORAL_TABLET | ORAL | Status: AC
  Filled 2020-06-27: qty 1

## 2020-06-27 MED ORDER — DEXAMETHASONE SODIUM PHOSPHATE 10 MG/ML IJ SOLN
INTRAMUSCULAR | Status: AC
  Filled 2020-06-27: qty 1

## 2020-06-27 MED ORDER — EPHEDRINE 5 MG/ML INJ
INTRAVENOUS | Status: AC
  Filled 2020-06-27: qty 10

## 2020-06-27 MED ORDER — FENTANYL CITRATE (PF) 100 MCG/2ML IJ SOLN
INTRAMUSCULAR | Status: AC
  Filled 2020-06-27: qty 2

## 2020-06-27 MED ORDER — EPINEPHRINE PF 1 MG/ML IJ SOLN
INTRAMUSCULAR | Status: AC
  Filled 2020-06-27: qty 1

## 2020-06-27 MED ORDER — OXYCODONE HCL 5 MG PO TABS
5.0000 mg | ORAL_TABLET | Freq: Once | ORAL | Status: AC | PRN
  Administered 2020-06-27: 5 mg via ORAL

## 2020-06-27 MED ORDER — LIDOCAINE 2% (20 MG/ML) 5 ML SYRINGE
INTRAMUSCULAR | Status: AC
  Filled 2020-06-27: qty 5

## 2020-06-27 MED ORDER — MIDAZOLAM HCL 5 MG/5ML IJ SOLN
INTRAMUSCULAR | Status: DC | PRN
  Administered 2020-06-27: 2 mg via INTRAVENOUS

## 2020-06-27 MED ORDER — LACTATED RINGERS IV SOLN
INTRAVENOUS | Status: DC | PRN
  Administered 2020-06-27: 400 mL

## 2020-06-27 MED ORDER — SUGAMMADEX SODIUM 500 MG/5ML IV SOLN
INTRAVENOUS | Status: DC | PRN
  Administered 2020-06-27: 250 mg via INTRAVENOUS

## 2020-06-27 MED ORDER — MEPERIDINE HCL 25 MG/ML IJ SOLN: 6.2500 mg | INTRAMUSCULAR | Status: DC | PRN

## 2020-06-27 MED ORDER — EPHEDRINE SULFATE 50 MG/ML IJ SOLN
INTRAMUSCULAR | Status: DC | PRN
  Administered 2020-06-27: 5 mg via INTRAVENOUS
  Administered 2020-06-27: 10 mg via INTRAVENOUS

## 2020-06-27 MED ORDER — OXYCODONE HCL 5 MG/5ML PO SOLN: 5.0000 mg | Freq: Once | ORAL | Status: AC | PRN

## 2020-06-27 MED ORDER — ROCURONIUM BROMIDE 10 MG/ML (PF) SYRINGE
PREFILLED_SYRINGE | INTRAVENOUS | Status: AC
  Filled 2020-06-27: qty 10

## 2020-06-27 MED ORDER — LIDOCAINE HCL (CARDIAC) PF 100 MG/5ML IV SOSY
PREFILLED_SYRINGE | INTRAVENOUS | Status: DC | PRN
  Administered 2020-06-27: 100 mg via INTRAVENOUS

## 2020-06-27 MED ORDER — ROCURONIUM BROMIDE 100 MG/10ML IV SOLN
INTRAVENOUS | Status: DC | PRN
  Administered 2020-06-27: 100 mg via INTRAVENOUS

## 2020-06-27 MED ORDER — ONDANSETRON HCL 4 MG/2ML IJ SOLN
INTRAMUSCULAR | Status: AC
  Filled 2020-06-27: qty 2

## 2020-06-27 MED ORDER — LIDOCAINE-EPINEPHRINE 1 %-1:100000 IJ SOLN
INTRAMUSCULAR | Status: AC
  Filled 2020-06-27: qty 1

## 2020-06-27 SURGICAL SUPPLY — 57 items
ADH SKN CLS APL DERMABOND .7 (GAUZE/BANDAGES/DRESSINGS)
APL SKNCLS STERI-STRIP NONHPOA (GAUZE/BANDAGES/DRESSINGS) ×4
BENZOIN TINCTURE PRP APPL 2/3 (GAUZE/BANDAGES/DRESSINGS) ×6 IMPLANT
BLADE SURG 15 STRL LF DISP TIS (BLADE) ×4 IMPLANT
BLADE SURG 15 STRL SS (BLADE) ×6
BNDG GAUZE ELAST 4 BULKY (GAUZE/BANDAGES/DRESSINGS) IMPLANT
BRUSH SCRUB EZ PLAIN DRY (MISCELLANEOUS) ×6 IMPLANT
CANISTER SUCT 1200ML W/VALVE (MISCELLANEOUS) ×3 IMPLANT
CLSR STERI-STRIP ANTIMIC 1/2X4 (GAUZE/BANDAGES/DRESSINGS) IMPLANT
COVER BACK TABLE 60X90IN (DRAPES) ×3 IMPLANT
COVER MAYO STAND STRL (DRAPES) ×3 IMPLANT
DERMABOND ADVANCED (GAUZE/BANDAGES/DRESSINGS)
DERMABOND ADVANCED .7 DNX12 (GAUZE/BANDAGES/DRESSINGS) IMPLANT
DRAPE U-SHAPE 76X120 STRL (DRAPES) ×6 IMPLANT
DRSG ADAPTIC 3X8 NADH LF (GAUZE/BANDAGES/DRESSINGS) IMPLANT
DRSG EMULSION OIL 3X3 NADH (GAUZE/BANDAGES/DRESSINGS) IMPLANT
DRSG PAD ABDOMINAL 8X10 ST (GAUZE/BANDAGES/DRESSINGS) ×12 IMPLANT
ELECT COATED BLADE 2.86 ST (ELECTRODE) IMPLANT
ELECT NEEDLE BLADE 2-5/6 (NEEDLE) ×3 IMPLANT
ELECT REM PT RETURN 9FT ADLT (ELECTROSURGICAL) ×3
ELECTRODE REM PT RTRN 9FT ADLT (ELECTROSURGICAL) ×2 IMPLANT
GAUZE SPONGE 4X4 12PLY STRL LF (GAUZE/BANDAGES/DRESSINGS) ×6 IMPLANT
GAUZE XEROFORM 1X8 LF (GAUZE/BANDAGES/DRESSINGS) IMPLANT
GAUZE XEROFORM 5X9 LF (GAUZE/BANDAGES/DRESSINGS) ×3 IMPLANT
GLOVE BIOGEL M 6.5 STRL (GLOVE) ×9 IMPLANT
GLOVE BIOGEL M STRL SZ7.5 (GLOVE) ×3 IMPLANT
GLOVE BIOGEL PI IND STRL 8 (GLOVE) ×2 IMPLANT
GLOVE BIOGEL PI INDICATOR 8 (GLOVE) ×1
GLOVE SURG SYN 7.0 (GLOVE) ×3 IMPLANT
GOWN STRL REUS W/ TWL LRG LVL3 (GOWN DISPOSABLE) ×6 IMPLANT
GOWN STRL REUS W/TWL LRG LVL3 (GOWN DISPOSABLE) ×9
NEEDLE PRECISIONGLIDE 27X1.5 (NEEDLE) ×3 IMPLANT
NEEDLE SPNL 18GX3.5 QUINCKE PK (NEEDLE) ×3 IMPLANT
NS IRRIG 1000ML POUR BTL (IV SOLUTION) IMPLANT
PACK BASIN DAY SURGERY FS (CUSTOM PROCEDURE TRAY) ×3 IMPLANT
PENCIL SMOKE EVACUATOR (MISCELLANEOUS) ×3 IMPLANT
SHEET MEDIUM DRAPE 40X70 STRL (DRAPES) ×9 IMPLANT
SLEEVE SCD COMPRESS KNEE MED (MISCELLANEOUS) ×3 IMPLANT
STAPLER INSORB 30 2030 C-SECTI (MISCELLANEOUS) ×3 IMPLANT
STAPLER VISISTAT 35W (STAPLE) IMPLANT
STRIP CLOSURE SKIN 1/2X4 (GAUZE/BANDAGES/DRESSINGS) ×12 IMPLANT
STRIP SUTURE WOUND CLOSURE 1/2 (MISCELLANEOUS) ×3 IMPLANT
SUCTION FRAZIER HANDLE 10FR (MISCELLANEOUS)
SUCTION TUBE FRAZIER 10FR DISP (MISCELLANEOUS) IMPLANT
SUT CHROMIC 3 0 PS 2 (SUTURE) ×6 IMPLANT
SUT ETHILON 3 0 PS 1 (SUTURE) ×12 IMPLANT
SUT ETHILON 4 0 PS 2 18 (SUTURE) IMPLANT
SUT MNCRL AB 3-0 PS2 18 (SUTURE) ×15 IMPLANT
SUT MNCRL AB 4-0 PS2 18 (SUTURE) IMPLANT
SUT PDS 3-0 CT2 (SUTURE) ×12
SUT PDS II 3-0 CT2 27 ABS (SUTURE) ×8 IMPLANT
SUT PROLENE 4 0 PS 2 18 (SUTURE) IMPLANT
SUT VICRYL 4-0 PS2 18IN ABS (SUTURE) IMPLANT
SUT VLOC 90 P-14 23 (SUTURE) ×3 IMPLANT
SYR BULB EAR ULCER 3OZ GRN STR (SYRINGE) ×3 IMPLANT
TOWEL GREEN STERILE FF (TOWEL DISPOSABLE) ×6 IMPLANT
TUBE CONNECTING 20X1/4 (TUBING) ×6 IMPLANT
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
## 2020-06-27 NOTE — Anesthesia Procedure Notes (Signed)
Procedure Name: Intubation Date/Time: 06/27/2020 7:53 AM Performed by: Glory Buff, CRNA Pre-anesthesia Checklist: Patient identified, Emergency Drugs available, Suction available and Patient being monitored Patient Re-evaluated:Patient Re-evaluated prior to induction Oxygen Delivery Method: Circle system utilized Preoxygenation: Pre-oxygenation with 100% oxygen Induction Type: IV induction Ventilation: Mask ventilation without difficulty Laryngoscope Size: Miller and 3 Grade View: Grade I Tube type: Oral Tube size: 7.5 mm Number of attempts: 1 Airway Equipment and Method: Stylet and Oral airway (90 mm) Placement Confirmation: ETT inserted through vocal cords under direct vision,  positive ETCO2 and breath sounds checked- equal and bilateral Secured at: 22 cm Tube secured with: Tape Dental Injury: Teeth and Oropharynx as per pre-operative assessment  Comments: Easy mask airway with 90 mm OA, DL x 1 with Miller 3 VC identified, "hockey stick " bend on ETT to facilitate intubation.
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
## 2020-06-27 NOTE — Discharge Instructions (Signed)
Activity: As tolerated, but avoid strenuous activity until follow up visit.  Diet: Regular  Wound Care: Keep neck bolster dressing clean & dry until your follow up visit.  Wear C-collar all the time until follow-up visit.  Do not get neck bolster wet. Arm and axilla wounds: Redress the wounds as needed for comfort.  Special Instructions:  Call our office if any unusual problems occur such as pain, excessive bleeding, unrelieved nausea/vomiting, fever &/or chills.  Follow-up appointment: Scheduled for next week.   Post Anesthesia Home Care Instructions  Activity: Get plenty of rest for the remainder of the day. A responsible individual must stay with you for 24 hours following the procedure.  For the next 24 hours, DO NOT: -Drive a car -Paediatric nurse -Drink alcoholic beverages -Take any medication unless instructed by your physician -Make any legal decisions or sign important papers.  Meals: Start with liquid foods such as gelatin or soup. Progress to regular foods as tolerated. Avoid greasy, spicy, heavy foods. If nausea and/or vomiting occur, drink only clear liquids until the nausea and/or vomiting subsides. Call your physician if vomiting continues.  Special Instructions/Symptoms: Your throat may feel dry or sore from the anesthesia or the breathing tube placed in your throat during surgery. If this causes discomfort, gargle with warm salt water. The discomfort should disappear within 24 hours.  If you had a scopolamine patch placed behind your ear for the management of post- operative nausea and/or vomiting:  1. The medication in the patch is effective for 72 hours, after which it should be removed.  Wrap patch in a tissue and discard in the trash. Wash hands thoroughly with soap and water. 2. You may remove the patch earlier than 72 hours if you experience unpleasant side effects which may include dry mouth, dizziness or visual disturbances. 3. Avoid touching the patch.  Wash your hands with soap and water after contact with the patch.

## 2020-06-27 NOTE — Op Note (Signed)
Operative Note   DATE OF OPERATION: 06/27/2020  SURGICAL DEPARTMENT: Plastic Surgery  PREOPERATIVE DIAGNOSES: 1.  Bilateral axillary burn scar contracture 2.  Anterior neck burn scar contracture  POSTOPERATIVE DIAGNOSES:  same  PROCEDURE: 1.  Left axillary burn scar contracture release and resurfacing with Z-plasty adjacent tissue transfer totaling 12 x 5 cm 2.  Right axillary burn scar contracture release and resurfacing with Z-plasty adjacent tissue transfer totaling 10 x 5 cm 3.  Surgical preparation for grafting of the anterior neck by release of burn scar contracture totaling 15 x 4 cm 4.  Full-thickness skin graft to the anterior neck totaling 15 x 4 cm  SURGEON: Talmadge Coventry, MD  ASSISTANT: Phoebe Sharps, PA The advanced practice practitioner (APP) assisted throughout the case.  The APP was essential in retraction and counter traction when needed to make the case progress smoothly.  This retraction and assistance made it possible to see the tissue plans for the procedure.  The assistance was needed for blood control, tissue re-approximation and assisted with closure of the incision site.  ANESTHESIA:  General.   COMPLICATIONS: None.   INDICATIONS FOR PROCEDURE:  The patient, [name] is a 64 y.o. male born on 1956-03-15, is here for treatment of burn scar contractures in bilateral axilla and anterior neck MRN: [id number]  CONSENT:  Informed consent was obtained directly from the patient. Risks, benefits and alternatives were fully discussed. Specific risks including but not limited to bleeding, infection, hematoma, seroma, scarring, pain, contracture, asymmetry, wound healing problems, and need for further surgery were all discussed. The patient did have an ample opportunity to have questions answered to satisfaction.   DESCRIPTION OF PROCEDURE:  The patient was taken to the operating room. SCDs were placed and antibiotics were given.  General anesthesia was  administered.  The patient's operative site was prepped and draped in a sterile fashion. A time out was performed and all information was confirmed to be correct.  I started by marking out the areas of contracture.  He had a linear band extending along the anterior axillary line on the right and left axilla.  I marked longitudinally along this band and then planned multiple Z-plasties in both areas.  I then marked along the tight anterior neck contracture and planned a transverse release in the supraclavicular area.  All areas were then infiltrated with local anesthetic.  I started with the left axilla.  I incised along the band longitudinally with a 15 blade and then elevated the Z-plasty flaps.  These were transposed and secured with a combination of PDS and Monocryl sutures.  I then turned my attention to the right axilla.  Similarly I incised along the palpable contracture band longitudinally and then elevated disease plasty flaps and transpose them and secured them with PDS and Monocryl sutures.  At this point I turned my attention to the neck.  I made a transverse incision along his pre-existing scar and then extended the neck further to provide separation of the skin and allow release of the flexion contracture.  This resulted in a defect that was 15 x 4 cm in size.  Meticulous hemostasis was obtained.  A full-thickness skin graft was then harvested from the right upper arm.  The donor site was closed with interrupted buried Enzor staples and a running 3 OV lock.  The graft was then defatted and inset with chromic sutures.  A bolster was then fashioned with scrub brush sponge and Xeroform and 3-0 nylon.  This gave  him a nice on table result.  There is significant improvement in the contractures in all areas.  A c-collar was placed to keep the neck from flexing to avoid recurrence of the contracture.  The remainder the incisions were covered with soft dressings.  The patient tolerated the procedure well.   There were no complications. The patient was allowed to wake from anesthesia, extubated and taken to the recovery room in satisfactory condition.
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
## 2020-06-27 NOTE — Transfer of Care (Signed)
Immediate Anesthesia Transfer of Care Note  Patient: [name]  Procedure(s) Performed: Release of bilateral axillary burn scar contracture with Z-plasties (Bilateral Axilla) Release of anterior neck burn contracture with full-thickness skin graft (N/A Neck)  Patient Location: PACU  Anesthesia Type:General  Level of Consciousness: drowsy, patient cooperative and responds to stimulation  Airway & Oxygen Therapy: Patient Spontanous Breathing and Patient connected to face mask oxygen  Post-op Assessment: Report given to RN and Post -op Vital signs reviewed and stable  Post vital signs: Reviewed and stable  Last Vitals:  Vitals Value Taken Time  BP 163/78 06/27/20 1028  Temp    Pulse 63 06/27/20 1029  Resp 14 06/27/20 1029  SpO2 100 % 06/27/20 1029  Vitals shown include unvalidated device data.  Last Pain:  Vitals:   06/27/20 0639  TempSrc: Oral  PainSc: 0-No pain      Patients Stated Pain Goal: 5 (97/84/78 4128)  Complications: No complications documented.

## 2020-06-28 ENCOUNTER — Encounter (HOSPITAL_BASED_OUTPATIENT_CLINIC_OR_DEPARTMENT_OTHER): Payer: Self-pay | Admitting: Plastic Surgery

## 2020-07-05 ENCOUNTER — Ambulatory Visit (INDEPENDENT_AMBULATORY_CARE_PROVIDER_SITE_OTHER): Payer: Managed Care, Other (non HMO) | Admitting: Plastic Surgery

## 2020-07-05 ENCOUNTER — Other Ambulatory Visit: Payer: Self-pay

## 2020-07-05 ENCOUNTER — Encounter: Payer: Self-pay | Admitting: Plastic Surgery

## 2020-07-05 VITALS — BP 119/85 | HR 66 | Temp 97.5°F

## 2020-07-05 DIAGNOSIS — L905 Scar conditions and fibrosis of skin: Secondary | ICD-10-CM

## 2020-07-05 NOTE — Progress Notes (Signed)
Patient presents 1 week postop from burn scar contracture release.  I did Z-plasties in the bilateral axilla along with full-thickness skin graft to the neck.  The patient is very happy thus far and has not been in a lot of pain.  On exam all the Z-plasties look to be healing fine.  The neck skin graft bolster was removed revealing a nice take of the full-thickness skin graft.  The patient has been wearing a c-collar and have encouraged him to do so for another week.  We will plan to continue to dress the wounds and incisions with Xeroform 4 x 4's and ABDs and I will see him again in 2 weeks.  Any remaining sutures can be removed at that time although all the sutures were used or dissolvable.  All of his questions were answered.  He did want to discuss evaluation of his nose with me.  He has had significant scarring around both alar areas and his main complaint at this point is alar contracture and collapse on the left side.  There is some deficiency in terms of the structural support and scarring and contracture of the ala on that side greater than on the right.  I am going to think about this and explained that it would be a challenging problem to correct and we will talk more about this at his next visit.

## 2020-07-06 ENCOUNTER — Telehealth: Payer: Self-pay

## 2020-07-06 NOTE — Telephone Encounter (Signed)
Faxed orders to [facility]: ABD, 4x4 guaze, xeroform, medipore tape, kerlix and gloves daily

## 2020-07-19 ENCOUNTER — Encounter: Payer: Self-pay | Admitting: Plastic Surgery

## 2020-07-19 ENCOUNTER — Ambulatory Visit (INDEPENDENT_AMBULATORY_CARE_PROVIDER_SITE_OTHER): Payer: Managed Care, Other (non HMO) | Admitting: Plastic Surgery

## 2020-07-19 ENCOUNTER — Other Ambulatory Visit: Payer: Self-pay

## 2020-07-19 VITALS — BP 121/76 | HR 68

## 2020-07-19 DIAGNOSIS — L905 Scar conditions and fibrosis of skin: Secondary | ICD-10-CM

## 2020-07-19 NOTE — Progress Notes (Signed)
Patient presents now 3 weeks out from full-thickness skin graft to the neck and Z-plasty to the axilla for burn scar contractures.  He is overall very happy with his results so far.  On exam the full-thickness skin graft looks like a complete taken all of the Z-plasty incisions are healing fine in the axilla.  I removed some PDS sutures from the axilla that had not fallen out yet.  I reviewed with him again about his nose and I ultimately did not feel that there is anything that I could think of to do for his nose externally that would yield a high level of success.  He has significant scar contracture of the left ala and some collapse when breathing in and my fear in doing a cartilage graft or any other procedure would be that it would potentially destabilize things for further and leave him with a worse result.  I did suggest that he see an ENT to look internally to see if anything could be done to improve airway patency from that perspective.  He wants to go to work starting December 6 and I think that sounds great.  He is fully aware of the postoperative recovery as he has had multiple skin grafts and scar contracture release procedures in the past so I will allow him to follow-up on an as-needed basis which is his wish.  All of his questions were answered.

## 2020-07-24 ENCOUNTER — Other Ambulatory Visit: Payer: Self-pay

## 2020-07-24 ENCOUNTER — Encounter: Payer: Self-pay | Admitting: Family Medicine

## 2020-07-24 MED ORDER — BUPROPION HCL ER (XL) 300 MG PO TB24
300.0000 mg | ORAL_TABLET | ORAL | 2 refills | Status: DC
Start: 2020-07-24 — End: 2021-05-28

## 2020-07-24 MED ORDER — DULOXETINE HCL 60 MG PO CPEP
60.0000 mg | ORAL_CAPSULE | ORAL | 2 refills | Status: DC
Start: 2020-07-24 — End: 2021-04-02

## 2020-08-02 ENCOUNTER — Other Ambulatory Visit: Payer: Self-pay

## 2020-08-02 ENCOUNTER — Encounter: Payer: Self-pay | Admitting: Urology

## 2020-08-02 ENCOUNTER — Ambulatory Visit (INDEPENDENT_AMBULATORY_CARE_PROVIDER_SITE_OTHER): Payer: Managed Care, Other (non HMO) | Admitting: Urology

## 2020-08-02 VITALS — BP 120/77 | HR 61 | Ht 71.0 in | Wt 300.0 lb

## 2020-08-02 DIAGNOSIS — N529 Male erectile dysfunction, unspecified: Secondary | ICD-10-CM

## 2020-08-02 DIAGNOSIS — N401 Enlarged prostate with lower urinary tract symptoms: Secondary | ICD-10-CM

## 2020-08-02 LAB — BLADDER SCAN AMB NON-IMAGING: Scan Result: 0

## 2020-08-02 MED ORDER — TADALAFIL 20 MG PO TABS: 20.0000 mg | ORAL_TABLET | Freq: Every day | ORAL | 6 refills | Status: DC | PRN

## 2020-08-02 NOTE — Progress Notes (Signed)
   08/02/2020 4:12 PM   [name] 04-28-1956 [id number]  Reason for visit: Follow up BPH status post HOLEP, ED, low testosterone  HPI: I saw [name] for follow-up of the above issues.  He underwent a HOLEP on 02/25/2020 for BPH with severe urinary symptoms.  He ultimately noted some dysuria and weaker stream postop and on 03/29/2020 was found of a fossa navicularis stricture that was dilated in clinic.  He then performed self dilations of the distal urethra a few times a week for about a month.  This has completely resolved his urinary symptoms and he denies any urinary complaints today.  IPSS score today is 2, with quality of life delighted, and PVR 0 mL.  Regarding his ED, he previously has been on PDE 5 inhibitors with moderate results in the past, as well as testosterone.  He has not been on testosterone in a while.  He feels like the PDE 5 inhibitors worked better when he was on testosterone.  He is able to get some erections, but not enough for penetration currently.  There are no testosterone values in our system to review.  I recommended a trial of Cialis 20 mg on demand for ED, with close follow-up in 1 month for a testosterone level.  We discussed the risk and benefits of testosterone replacement.  RTC 1 month with testosterone prior with PSA   Billey Co, MD  [facility] [address] [place], [place] [address] [phone]

## 2020-08-02 NOTE — Patient Instructions (Signed)
Tadalafil tablets (Cialis) What is this medicine? TADALAFIL (tah DA la fil) is used to treat erection problems in men. It is also used for enlargement of the prostate gland in men, a condition called benign prostatic hyperplasia or BPH. This medicine improves urine flow and reduces BPH symptoms. This medicine can also treat both erection problems and BPH when they occur together. This medicine may be used for other purposes; ask your health care provider or pharmacist if you have questions. COMMON BRAND NAME(S): Adcirca, ALYQ, Cialis What should I tell my health care provider before I take this medicine? They need to know if you have any of these conditions:  bleeding disorders  eye or vision problems, including a rare inherited eye disease called retinitis pigmentosa  anatomical deformation of the penis, Peyronie's disease, or history of priapism (painful and prolonged erection)  heart disease, angina, a history of heart attack, irregular heart beats, or other heart problems  high or low blood pressure  history of blood diseases, like sickle cell anemia or leukemia  history of stomach bleeding  kidney disease  liver disease  stroke  an unusual or allergic reaction to tadalafil, other medicines, foods, dyes, or preservatives  pregnant or trying to get pregnant  breast-feeding How should I use this medicine? Take this medicine by mouth with a glass of water. Follow the directions on the prescription label. You may take this medicine with or without meals. When this medicine is used for erection problems, your doctor may prescribe it to be taken once daily or as needed. If you are taking the medicine as needed, you may be able to have sexual activity 30 minutes after taking it and for up to 36 hours after taking it. Whether you are taking the medicine as needed or once daily, you should not take more than one dose per day. If you are taking this medicine for symptoms of benign  prostatic hyperplasia (BPH) or to treat both BPH and an erection problem, take the dose once daily at about the same time each day. Do not take your medicine more often than directed. Talk to your pediatrician regarding the use of this medicine in children. Special care may be needed. Overdosage: If you think you have taken too much of this medicine contact a poison control center or emergency room at once. NOTE: This medicine is only for you. Do not share this medicine with others. What if I miss a dose? If you are taking this medicine as needed for erection problems, this does not apply. If you miss a dose while taking this medicine once daily for an erection problem, benign prostatic hyperplasia, or both, take it as soon as you remember, but do not take more than one dose per day. What may interact with this medicine? Do not take this medicine with any of the following medications:  nitrates like amyl nitrite, isosorbide dinitrate, isosorbide mononitrate, nitroglycerin  other medicines for erectile dysfunction like avanafil, sildenafil, vardenafil  other tadalafil products (Adcirca)  riociguat This medicine may also interact with the following medications:  certain drugs for high blood pressure  certain drugs for the treatment of HIV infection or AIDS  certain drugs used for fungal or yeast infections, like fluconazole, itraconazole, ketoconazole, and voriconazole  certain drugs used for seizures like carbamazepine, phenytoin, and phenobarbital  grapefruit juice  macrolide antibiotics like clarithromycin, erythromycin, troleandomycin  medicines for prostate problems  rifabutin, rifampin or rifapentine This list may not describe all possible interactions. Give your   health care provider a list of all the medicines, herbs, non-prescription drugs, or dietary supplements you use. Also tell them if you smoke, drink alcohol, or use illegal drugs. Some items may interact with your  medicine. What should I watch for while using this medicine? If you notice any changes in your vision while taking this drug, call your doctor or health care professional as soon as possible. Stop using this medicine and call your health care provider right away if you have a loss of sight in one or both eyes. Contact your doctor or health care professional right away if the erection lasts longer than 4 hours or if it becomes painful. This may be a sign of serious problem and must be treated right away to prevent permanent damage. If you experience symptoms of nausea, dizziness, chest pain or arm pain upon initiation of sexual activity after taking this medicine, you should refrain from further activity and call your doctor or health care professional as soon as possible. Do not drink alcohol to excess (examples, 5 glasses of wine or 5 shots of whiskey) when taking this medicine. When taken in excess, alcohol can increase your chances of getting a headache or getting dizzy, increasing your heart rate or lowering your blood pressure. Using this medicine does not protect you or your partner against HIV infection (the virus that causes AIDS) or other sexually transmitted diseases. What side effects may I notice from receiving this medicine? Side effects that you should report to your doctor or health care professional as soon as possible:  allergic reactions like skin rash, itching or hives, swelling of the face, lips, or tongue  breathing problems  changes in hearing  changes in vision  chest pain  fast, irregular heartbeat  prolonged or painful erection  seizures Side effects that usually do not require medical attention (report to your doctor or health care professional if they continue or are bothersome):  back pain  dizziness  flushing  headache  indigestion  muscle aches  nausea  stuffy or runny nose This list may not describe all possible side effects. Call your doctor  for medical advice about side effects. You may report side effects to FDA at 1-800-FDA-1088. Where should I keep my medicine? Keep out of the reach of children. Store at room temperature between 15 and 30 degrees C (59 and 86 degrees F). Throw away any unused medicine after the expiration date. NOTE: This sheet is a summary. It may not cover all possible information. If you have questions about this medicine, talk to your doctor, pharmacist, or health care provider.  2020 Elsevier/Gold Standard (2013-12-31 13:15:49)  

## 2020-08-24 DIAGNOSIS — T2034XA Burn of third degree of nose (septum), initial encounter: Secondary | ICD-10-CM | POA: Insufficient documentation

## 2020-08-28 ENCOUNTER — Other Ambulatory Visit: Payer: Managed Care, Other (non HMO)

## 2020-08-28 ENCOUNTER — Other Ambulatory Visit: Payer: Self-pay

## 2020-08-28 DIAGNOSIS — N401 Enlarged prostate with lower urinary tract symptoms: Secondary | ICD-10-CM

## 2020-08-29 DIAGNOSIS — E66812 Obesity, class 2: Secondary | ICD-10-CM | POA: Insufficient documentation

## 2020-08-29 DIAGNOSIS — R972 Elevated prostate specific antigen [PSA]: Secondary | ICD-10-CM | POA: Insufficient documentation

## 2020-08-29 DIAGNOSIS — G4700 Insomnia, unspecified: Secondary | ICD-10-CM | POA: Insufficient documentation

## 2020-08-29 DIAGNOSIS — J309 Allergic rhinitis, unspecified: Secondary | ICD-10-CM | POA: Insufficient documentation

## 2020-08-29 DIAGNOSIS — E782 Mixed hyperlipidemia: Secondary | ICD-10-CM | POA: Insufficient documentation

## 2020-08-29 DIAGNOSIS — M792 Neuralgia and neuritis, unspecified: Secondary | ICD-10-CM | POA: Insufficient documentation

## 2020-08-29 DIAGNOSIS — R0602 Shortness of breath: Secondary | ICD-10-CM | POA: Insufficient documentation

## 2020-08-29 DIAGNOSIS — H9319 Tinnitus, unspecified ear: Secondary | ICD-10-CM | POA: Insufficient documentation

## 2020-08-29 DIAGNOSIS — D126 Benign neoplasm of colon, unspecified: Secondary | ICD-10-CM | POA: Insufficient documentation

## 2020-08-29 DIAGNOSIS — F320 Major depressive disorder, single episode, mild: Secondary | ICD-10-CM | POA: Insufficient documentation

## 2020-08-29 DIAGNOSIS — G510 Bell's palsy: Secondary | ICD-10-CM | POA: Insufficient documentation

## 2020-08-29 DIAGNOSIS — N400 Enlarged prostate without lower urinary tract symptoms: Secondary | ICD-10-CM | POA: Insufficient documentation

## 2020-08-29 DIAGNOSIS — M25562 Pain in left knee: Secondary | ICD-10-CM | POA: Insufficient documentation

## 2020-08-29 DIAGNOSIS — Z87828 Personal history of other (healed) physical injury and trauma: Secondary | ICD-10-CM | POA: Insufficient documentation

## 2020-08-29 DIAGNOSIS — Z6838 Body mass index (BMI) 38.0-38.9, adult: Secondary | ICD-10-CM | POA: Insufficient documentation

## 2020-08-29 DIAGNOSIS — E785 Hyperlipidemia, unspecified: Secondary | ICD-10-CM | POA: Insufficient documentation

## 2020-08-29 DIAGNOSIS — N529 Male erectile dysfunction, unspecified: Secondary | ICD-10-CM | POA: Insufficient documentation

## 2020-08-29 LAB — TESTOSTERONE: Testosterone: 249 ng/dL — ABNORMAL LOW (ref 264–916)
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
## 2020-08-30 ENCOUNTER — Other Ambulatory Visit: Payer: Self-pay

## 2020-08-30 ENCOUNTER — Encounter: Payer: Self-pay | Admitting: Urology

## 2020-08-30 ENCOUNTER — Ambulatory Visit (INDEPENDENT_AMBULATORY_CARE_PROVIDER_SITE_OTHER): Payer: Managed Care, Other (non HMO) | Admitting: Urology

## 2020-08-30 VITALS — BP 122/81 | HR 66 | Ht 71.0 in | Wt 299.0 lb

## 2020-08-30 DIAGNOSIS — R310 Gross hematuria: Secondary | ICD-10-CM

## 2020-08-30 DIAGNOSIS — N401 Enlarged prostate with lower urinary tract symptoms: Secondary | ICD-10-CM

## 2020-08-30 DIAGNOSIS — E349 Endocrine disorder, unspecified: Secondary | ICD-10-CM | POA: Diagnosis not present

## 2020-08-30 DIAGNOSIS — N529 Male erectile dysfunction, unspecified: Secondary | ICD-10-CM

## 2020-08-30 MED ORDER — TADALAFIL 20 MG PO TABS: 20.0000 mg | ORAL_TABLET | Freq: Every day | ORAL | 6 refills | Status: DC | PRN

## 2020-09-04 ENCOUNTER — Other Ambulatory Visit: Payer: Managed Care, Other (non HMO)

## 2020-09-04 ENCOUNTER — Other Ambulatory Visit: Payer: Self-pay

## 2020-09-04 DIAGNOSIS — N529 Male erectile dysfunction, unspecified: Secondary | ICD-10-CM

## 2020-09-04 DIAGNOSIS — N401 Enlarged prostate with lower urinary tract symptoms: Secondary | ICD-10-CM

## 2020-09-04 DIAGNOSIS — E349 Endocrine disorder, unspecified: Secondary | ICD-10-CM

## 2020-09-05 ENCOUNTER — Telehealth: Payer: Self-pay | Admitting: Family Medicine

## 2020-09-05 DIAGNOSIS — E349 Endocrine disorder, unspecified: Secondary | ICD-10-CM

## 2020-09-05 LAB — PSA: Prostate Specific Ag, Serum: 0.3 ng/mL (ref 0.0–4.0)

## 2020-09-05 LAB — TESTOSTERONE: Testosterone: 222 ng/dL — ABNORMAL LOW (ref 264–916)

## 2020-09-05 LAB — FSH/LH
FSH: 9.9 m[IU]/mL (ref 1.5–12.4)
LH: 10.1 m[IU]/mL — ABNORMAL HIGH (ref 1.7–8.6)

## 2020-09-05 MED ORDER — CLOMIPHENE CITRATE 50 MG PO TABS: ORAL_TABLET | ORAL | 1 refills | Status: DC

## 2020-09-05 NOTE — Telephone Encounter (Signed)
Patient notified and voiced understanding. Lab appointment has been scheduled.  

## 2020-09-05 NOTE — Telephone Encounter (Signed)
-----   Message from Nori Riis, PA-C sent at 09/05/2020  8:02 AM EST ----- Please let Mr. Holycross know that his second testosterone has returned below 300 and his PSA was normal.  We can go ahead and start the Clomid 50 mg, 1/2 tablet daily and repeat his levels in one month with a testosterone before 10 am.  With [facility], the prescription would cost around $26 for 30 tablets at [facility].

## 2020-09-27 ENCOUNTER — Encounter: Payer: Self-pay | Admitting: Family Medicine

## 2020-09-27 ENCOUNTER — Other Ambulatory Visit: Payer: Self-pay

## 2020-09-27 DIAGNOSIS — E039 Hypothyroidism, unspecified: Secondary | ICD-10-CM

## 2020-09-27 DIAGNOSIS — G479 Sleep disorder, unspecified: Secondary | ICD-10-CM

## 2020-09-27 MED ORDER — TRAZODONE HCL 100 MG PO TABS: 200.0000 mg | ORAL_TABLET | Freq: Every day | ORAL | 0 refills | Status: DC

## 2020-09-27 MED ORDER — LEVOTHYROXINE SODIUM 75 MCG PO TABS: 75.0000 ug | ORAL_TABLET | Freq: Every day | ORAL | 0 refills | Status: DC

## 2020-09-28 ENCOUNTER — Ambulatory Visit: Payer: Self-pay | Admitting: Urology

## 2020-10-05 ENCOUNTER — Other Ambulatory Visit: Payer: Self-pay

## 2020-10-05 ENCOUNTER — Other Ambulatory Visit: Payer: Managed Care, Other (non HMO)

## 2020-10-05 DIAGNOSIS — E349 Endocrine disorder, unspecified: Secondary | ICD-10-CM

## 2020-10-06 ENCOUNTER — Telehealth: Payer: Self-pay | Admitting: Family Medicine

## 2020-10-06 LAB — TESTOSTERONE: Testosterone: 358 ng/dL (ref 264–916)

## 2020-10-06 NOTE — Telephone Encounter (Signed)
-----   Message from Nori Riis, PA-C sent at 10/06/2020  7:54 AM EST ----- Please let Mr. Degroote know that his testosterone level has increased, but it is not yet therapeutic.  I advise him to take a whole tablet of Clomid 50 mg and we will check his testosterone level in one month before 10 am.

## 2020-10-06 NOTE — Telephone Encounter (Signed)
Patient notified and voiced understanding. Appointment has been made.  

## 2020-11-01 ENCOUNTER — Ambulatory Visit: Payer: Managed Care, Other (non HMO) | Admitting: Primary Care

## 2020-11-01 ENCOUNTER — Encounter: Payer: Self-pay | Admitting: Primary Care

## 2020-11-01 ENCOUNTER — Other Ambulatory Visit: Payer: Self-pay

## 2020-11-01 VITALS — BP 126/78 | HR 67 | Temp 97.6°F | Ht 71.0 in | Wt 317.0 lb

## 2020-11-01 DIAGNOSIS — L089 Local infection of the skin and subcutaneous tissue, unspecified: Secondary | ICD-10-CM | POA: Diagnosis not present

## 2020-11-01 MED ORDER — SULFAMETHOXAZOLE-TRIMETHOPRIM 800-160 MG PO TABS: 1.0000 | ORAL_TABLET | Freq: Two times a day (BID) | ORAL | 0 refills | Status: DC

## 2020-11-01 NOTE — Assessment & Plan Note (Signed)
Representative of paronychia but also extending down to left 3rd digit. Appears infected.  PCN allergy with hives. Rx for Bactrim DS course sent to pharmacy. Return precautions provided.
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
## 2020-11-01 NOTE — Patient Instructions (Signed)
Start Bactrim DS (sulfamethoxazole/trimethoprim) tablet for infection. Take 1 tablet by mouth twice daily for 7 days.  Avoid cutting or picking at the wound.   Please call us in 3-4 days if no improvement.  It was a pleasure meeting you!

## 2020-11-06 ENCOUNTER — Other Ambulatory Visit: Payer: Self-pay

## 2020-11-06 DIAGNOSIS — E349 Endocrine disorder, unspecified: Secondary | ICD-10-CM

## 2020-11-07 ENCOUNTER — Other Ambulatory Visit: Payer: Managed Care, Other (non HMO)

## 2020-11-07 ENCOUNTER — Other Ambulatory Visit: Payer: Self-pay

## 2020-11-07 DIAGNOSIS — E349 Endocrine disorder, unspecified: Secondary | ICD-10-CM

## 2020-11-08 ENCOUNTER — Other Ambulatory Visit: Payer: Self-pay | Admitting: Urology

## 2020-11-08 ENCOUNTER — Telehealth: Payer: Self-pay | Admitting: Family Medicine

## 2020-11-08 LAB — TESTOSTERONE: Testosterone: 435 ng/dL (ref 264–916)

## 2020-11-08 MED ORDER — CLOMIPHENE CITRATE 50 MG PO TABS: ORAL_TABLET | ORAL | 3 refills | Status: DC

## 2020-11-08 NOTE — Telephone Encounter (Signed)
-----   Message from Nori Riis, PA-C sent at 11/08/2020  8:59 AM EDT ----- Please let [name] know that his testosterone has increased and is now at 435.  I would like for him to continue the Clomid 50 mg 1 tablet daily.  I would also like to see him in 3 months with a morning testosterone, LFTs, hemoglobin, hematocrit prior to the visit.

## 2020-11-08 NOTE — Progress Notes (Unsigned)
Prescription sent in for Clomid 50 mg daily to [facility].

## 2020-11-08 NOTE — Telephone Encounter (Signed)
Patient notified and appoints have been scheduled. He states he has been taking 1/2 tablet daily and if he needs to start 1 tablet it will need to be sent to [facility].

## 2020-11-10 ENCOUNTER — Other Ambulatory Visit: Payer: Self-pay

## 2020-11-13 ENCOUNTER — Encounter: Payer: Self-pay | Admitting: Family Medicine

## 2020-11-13 ENCOUNTER — Ambulatory Visit (INDEPENDENT_AMBULATORY_CARE_PROVIDER_SITE_OTHER): Payer: Managed Care, Other (non HMO) | Admitting: Family Medicine

## 2020-11-13 ENCOUNTER — Other Ambulatory Visit: Payer: Self-pay

## 2020-11-13 VITALS — BP 120/80 | HR 66 | Temp 98.4°F | Ht 71.0 in | Wt 302.5 lb

## 2020-11-13 DIAGNOSIS — Z114 Encounter for screening for human immunodeficiency virus [HIV]: Secondary | ICD-10-CM

## 2020-11-13 DIAGNOSIS — E039 Hypothyroidism, unspecified: Secondary | ICD-10-CM

## 2020-11-13 DIAGNOSIS — Z1159 Encounter for screening for other viral diseases: Secondary | ICD-10-CM

## 2020-11-13 DIAGNOSIS — G479 Sleep disorder, unspecified: Secondary | ICD-10-CM | POA: Diagnosis not present

## 2020-11-13 DIAGNOSIS — I10 Essential (primary) hypertension: Secondary | ICD-10-CM | POA: Diagnosis not present

## 2020-11-13 DIAGNOSIS — E782 Mixed hyperlipidemia: Secondary | ICD-10-CM

## 2020-11-13 DIAGNOSIS — Z23 Encounter for immunization: Secondary | ICD-10-CM | POA: Diagnosis not present

## 2020-11-13 DIAGNOSIS — L03012 Cellulitis of left finger: Secondary | ICD-10-CM | POA: Insufficient documentation

## 2020-11-13 DIAGNOSIS — Z0000 Encounter for general adult medical examination without abnormal findings: Secondary | ICD-10-CM | POA: Diagnosis not present

## 2020-11-13 MED ORDER — TRAZODONE HCL 100 MG PO TABS: 200.0000 mg | ORAL_TABLET | Freq: Every day | ORAL | 3 refills | Status: DC

## 2020-11-13 MED ORDER — MUPIROCIN CALCIUM 2 % EX CREA: 1.0000 "application " | TOPICAL_CREAM | Freq: Two times a day (BID) | CUTANEOUS | 0 refills | Status: DC

## 2020-11-13 MED ORDER — LEVOTHYROXINE SODIUM 75 MCG PO TABS: 75.0000 ug | ORAL_TABLET | Freq: Every day | ORAL | 3 refills | Status: DC

## 2020-11-13 NOTE — Patient Instructions (Signed)
Cream - try the topical ointment for the finger - let me know if it does not improve or worsens

## 2020-11-13 NOTE — Assessment & Plan Note (Signed)
Seems to be resolving. Trial of topical abx for prevention while erythema is still present.
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
## 2020-11-14 LAB — COMPREHENSIVE METABOLIC PANEL
ALT: 25 U/L (ref 0–53)
AST: 19 U/L (ref 0–37)
Albumin: 4.3 g/dL (ref 3.5–5.2)
Alkaline Phosphatase: 59 U/L (ref 39–117)
BUN: 17 mg/dL (ref 6–23)
CO2: 27 mEq/L (ref 19–32)
Calcium: 9.1 mg/dL (ref 8.4–10.5)
Chloride: 105 mEq/L (ref 96–112)
Creatinine, Ser: 1.05 mg/dL (ref 0.40–1.50)
GFR: 74.98 mL/min (ref >60.00)
Glucose, Bld: 79 mg/dL (ref 70–99)
Potassium: 4 mEq/L (ref 3.5–5.1)
Sodium: 141 mEq/L (ref 135–145)
Total Bilirubin: 0.4 mg/dL (ref 0.2–1.2)
Total Protein: 6.8 g/dL (ref 6.0–8.3)

## 2020-11-14 LAB — LIPID PANEL
Cholesterol: 92 mg/dL (ref 0–200)
HDL: 23.4 mg/dL — ABNORMAL LOW (ref >39.00)
LDL Cholesterol: 47 mg/dL (ref 0–99)
NonHDL: 68.41
Total CHOL/HDL Ratio: 4
Triglycerides: 107 mg/dL (ref 0.0–149.0)
VLDL: 21.4 mg/dL (ref 0.0–40.0)

## 2020-11-14 LAB — HEPATITIS C ANTIBODY
Hepatitis C Ab: NONREACTIVE
SIGNAL TO CUT-OFF: 0.01 (ref <1.00)

## 2020-11-14 LAB — HIV ANTIBODY (ROUTINE TESTING W REFLEX): HIV 1&2 Ab, 4th Generation: NONREACTIVE

## 2020-11-14 LAB — TSH: TSH: 6.09 u[IU]/mL — ABNORMAL HIGH (ref 0.35–4.50)

## 2020-11-15 ENCOUNTER — Encounter: Payer: Self-pay | Admitting: Family Medicine

## 2020-11-15 DIAGNOSIS — E039 Hypothyroidism, unspecified: Secondary | ICD-10-CM

## 2020-11-15 MED ORDER — LEVOTHYROXINE SODIUM 88 MCG PO TABS: 88.0000 ug | ORAL_TABLET | Freq: Every day | ORAL | 0 refills | Status: DC

## 2020-12-12 ENCOUNTER — Other Ambulatory Visit: Payer: Self-pay | Admitting: *Deleted

## 2020-12-12 DIAGNOSIS — E349 Endocrine disorder, unspecified: Secondary | ICD-10-CM

## 2020-12-26 ENCOUNTER — Other Ambulatory Visit: Payer: Self-pay | Admitting: Family Medicine

## 2020-12-26 DIAGNOSIS — E039 Hypothyroidism, unspecified: Secondary | ICD-10-CM

## 2020-12-28 ENCOUNTER — Other Ambulatory Visit (INDEPENDENT_AMBULATORY_CARE_PROVIDER_SITE_OTHER): Payer: Managed Care, Other (non HMO)

## 2020-12-28 ENCOUNTER — Other Ambulatory Visit: Payer: Self-pay

## 2020-12-28 DIAGNOSIS — E039 Hypothyroidism, unspecified: Secondary | ICD-10-CM

## 2020-12-28 LAB — TSH: TSH: 5 u[IU]/mL — ABNORMAL HIGH (ref 0.35–4.50)

## 2021-01-15 ENCOUNTER — Encounter: Payer: Self-pay | Admitting: Family Medicine

## 2021-01-15 ENCOUNTER — Telehealth: Payer: Self-pay

## 2021-01-15 NOTE — Telephone Encounter (Signed)
[place] nurse with access nurse called to say pt was advised to go to ED due to cough and SOB but pt refused; pt already has appt on 01/17/21 as video visit. I spoke with pt and for about 1 wk pt has had dry cough; pt has SOB today due to the humidity. S/T is very painful, hurts so bad does not want to swallow again. Pt has head congestion, runny nose and stopped up ears but ears do not hurt. No fever, chills, diarrhea or other covid symptoms. Pt said he does not want to go to ED or UC because he has these symptoms every year. Pt said if his condition changes or worsens prior to appt on 01/17/21 pt will go to ED for eval. I advised pt with a video visit Dr Einar Pheasant will not be able to listen to his chest, see his throat or ears. Pt said he understands that and is OK with it. Pt said he may ck a home covid test also. If pt test results turn positive pt will cb before his video appt. Sending note to Dr Einar Pheasant.

## 2021-01-15 NOTE — Telephone Encounter (Signed)
Agree with Nurse triage assessment and plan to call if covid positive or to go to ER if worsening.

## 2021-01-15 NOTE — Telephone Encounter (Signed)
Please see note below the access nurse note; pt did not sound SOB when I was speaking with pt on phone; pt was speaking in complete sentences and did not appear in any distress with his breathing.

## 2021-01-15 NOTE — Telephone Encounter (Signed)
[facility] - Client TELEPHONE ADVICE RECORD [facility]urse Patient Name: [facility] SIM MONS Gender: Male DOB: 1956-05-20 Age: 65 Y 9 M 1 D Return Phone Number: [phone] (Primary) Address: City/ State/ Zip: [place] [address] Client [facility] - Client Client Site [place] - Day Physician Waunita Schooner- MD Contact Type Call Who Is Calling Patient / Member / Family / Caregiver Call Type Triage / Clinical Relationship To Patient Self Return Phone Number [phone] (Primary) Chief Complaint BREATHING - shortness of breath or sounds breathless Reason for Call Symptomatic / Request for [place] states he is congested and having a hard time breathing. Translation No Nurse Assessment Nurse: Altamease Oiler, RN, Adriana Date/Time (Eastern Time): 01/15/2021 9:55:11 AM Confirm and document reason for call. If symptomatic, describe symptoms. ---pt states he has SOB "because of the weather". pt states he has runny nose, congestion, cough, sinus and ear pressure. sx onset last saturday. pt is vaccinated for covid Does the patient have any new or worsening symptoms? ---Yes Will a triage be completed? ---Yes Related visit to physician within the last 2 weeks? ---No Does the PT have any chronic conditions? (i.e. diabetes, asthma, this includes High risk factors for pregnancy, etc.) ---Yes List chronic conditions. ---htn chf overweight hypothyroid depression Is this a behavioral health or substance abuse call? ---No Guidelines Guideline Title Affirmed Question Affirmed Notes Nurse Date/Time (Eastern Time) COVID-19 - Diagnosed or Suspected MODERATE difficulty breathing (e.g., speaks in phrases, SOB even at rest, pulse 100-120) Altamease Oiler, RN, Adriana 01/15/2021 9:59:07 AM Disp. Time Eilene Ghazi Time) Disposition Final User 01/15/2021 9:53:39 AM Send to Urgent Paula Compton,  [place] PLEASE NOTE: All timestamps contained within this report are represented as [place] Standard Time. CONFIDENTIALTY NOTICE: This fax transmission is intended only for the addressee. It contains information that is legally privileged, confidential or otherwise protected from use or disclosure. If you are not the intended recipient, you are strictly prohibited from reviewing, disclosing, copying using or disseminating any of this information or taking any action in reliance on or regarding this information. If you have received this fax in error, please notify us immediately by telephone so that we can arrange for its return to [place]. Phone: [phone], Toll-Free: [phone], Fax: [phone] Page: 2 of 2 Call Id: [id number] 01/15/2021 10:00:01 AM Go to ED Now Yes Altamease Oiler, RN, Adriana Caller Disagree/Comply Disagree Caller Understands Yes PreDisposition Call Doctor Care Advice Given Per Guideline GO TO ED NOW: ANOTHER ADULT SHOULD DRIVE: * It is better and safer if another adult drives instead of you. CARE ADVICE given per COVID-19 - DIAGNOSED OR SUSPECTED (Adult) guideline. Comments User: Kizzie Fantasia, RN Date/Time Eilene Ghazi Time): 01/15/2021 10:18:48 AM spoke to [employer] at the office. made aware pt refused outcome Referrals GO TO FACILITY REFUSED

## 2021-01-17 ENCOUNTER — Other Ambulatory Visit: Payer: Self-pay

## 2021-01-17 ENCOUNTER — Telehealth: Payer: Managed Care, Other (non HMO) | Admitting: Family Medicine

## 2021-01-18 NOTE — Progress Notes (Signed)
Pt was called for virtual and cancelled as feeling better.

## 2021-02-11 ENCOUNTER — Other Ambulatory Visit: Payer: Self-pay | Admitting: Family Medicine

## 2021-02-11 DIAGNOSIS — E039 Hypothyroidism, unspecified: Secondary | ICD-10-CM

## 2021-02-12 ENCOUNTER — Other Ambulatory Visit: Payer: Self-pay

## 2021-02-12 ENCOUNTER — Other Ambulatory Visit: Payer: Managed Care, Other (non HMO)

## 2021-02-13 ENCOUNTER — Other Ambulatory Visit: Payer: Self-pay

## 2021-02-13 ENCOUNTER — Other Ambulatory Visit: Payer: Managed Care, Other (non HMO)

## 2021-02-14 ENCOUNTER — Encounter: Payer: Self-pay | Admitting: Family Medicine

## 2021-02-14 LAB — HEMOGLOBIN: Hemoglobin: 14.6 g/dL (ref 13.0–17.7)

## 2021-02-14 LAB — HEPATIC FUNCTION PANEL
ALT: 16 IU/L (ref 0–44)
AST: 24 IU/L (ref 0–40)
Albumin: 4.3 g/dL (ref 3.8–4.8)
Alkaline Phosphatase: 76 IU/L (ref 44–121)
Bilirubin Total: 0.3 mg/dL (ref 0.0–1.2)
Bilirubin, Direct: <0.1 mg/dL (ref 0.00–0.40)
Total Protein: 7 g/dL (ref 6.0–8.5)

## 2021-02-14 LAB — TESTOSTERONE: Testosterone: 347 ng/dL (ref 264–916)

## 2021-02-14 LAB — HEMATOCRIT: Hematocrit: 47.4 % (ref 37.5–51.0)
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
## 2021-02-16 ENCOUNTER — Ambulatory Visit (INDEPENDENT_AMBULATORY_CARE_PROVIDER_SITE_OTHER): Payer: Managed Care, Other (non HMO) | Admitting: Urology

## 2021-02-16 ENCOUNTER — Encounter: Payer: Self-pay | Admitting: Urology

## 2021-02-16 ENCOUNTER — Other Ambulatory Visit: Payer: Self-pay

## 2021-02-16 VITALS — BP 123/75 | HR 65 | Ht 71.0 in | Wt 305.0 lb

## 2021-02-16 DIAGNOSIS — E349 Endocrine disorder, unspecified: Secondary | ICD-10-CM | POA: Diagnosis not present

## 2021-02-16 DIAGNOSIS — N529 Male erectile dysfunction, unspecified: Secondary | ICD-10-CM

## 2021-02-16 DIAGNOSIS — N401 Enlarged prostate with lower urinary tract symptoms: Secondary | ICD-10-CM | POA: Diagnosis not present

## 2021-02-16 DIAGNOSIS — R319 Hematuria, unspecified: Secondary | ICD-10-CM | POA: Diagnosis not present

## 2021-02-16 LAB — BLADDER SCAN AMB NON-IMAGING: Scan Result: 0

## 2021-02-16 MED ORDER — TESTOSTERONE 4 MG/24HR TD PT24: 1.0000 | MEDICATED_PATCH | Freq: Every day | TRANSDERMAL | 5 refills | Status: DC

## 2021-02-27 ENCOUNTER — Other Ambulatory Visit: Payer: Self-pay

## 2021-02-27 ENCOUNTER — Ambulatory Visit: Payer: Managed Care, Other (non HMO) | Admitting: Family Medicine

## 2021-02-27 VITALS — BP 120/70 | HR 64 | Temp 97.5°F | Ht 71.0 in | Wt 322.8 lb

## 2021-02-27 DIAGNOSIS — G8929 Other chronic pain: Secondary | ICD-10-CM | POA: Diagnosis not present

## 2021-02-27 DIAGNOSIS — M25562 Pain in left knee: Secondary | ICD-10-CM | POA: Diagnosis not present

## 2021-02-27 DIAGNOSIS — M25561 Pain in right knee: Secondary | ICD-10-CM | POA: Diagnosis not present

## 2021-02-27 NOTE — Assessment & Plan Note (Signed)
Discussed etiology likely arthritis given weight, age, and joint line pain. Advised trial of voltaren gel and home exercise (declined PT referral). Discussed deferring XR to f/u as would not change initial management. Advised f/u with Dr. Lorelei Pont if not improving.

## 2021-02-27 NOTE — Patient Instructions (Signed)
#  Knee pain - likely arthritis - would recommend topical Voltaren Gel as needed - home exercises sheet - consider physical therapy if you have time  If not improving - return to see Dr. Lorelei Pont

## 2021-02-27 NOTE — Progress Notes (Signed)
Subjective:     [name] is a 65 y.o. male presenting for Knee Pain (Both x 2 years, with increasing pain over last 2-3 months )     Knee Pain    #Knee pain - 2 years of pain - both sides  - Left > Right - no hx of ortho, physical therapy - no hx of imaging - hx of left knee ligament injury while roller skating when he was younger  - no changes in behavior over the last 2-3 months - worse with walking and standing - treatment: rest, ice, heat w/o improvement - has not tried medications for this - pain is generalize over the knee cap area   Review of Systems   Social History   Tobacco Use  Smoking Status Former   Packs/day: 0.50   Years: 13.00   Pack years: 6.50   Types: Cigarettes   Quit date: 06/15/1985   Years since quitting: 35.7  Smokeless Tobacco Never        Objective:    BP Readings from Last 3 Encounters:  02/27/21 120/70  02/16/21 123/75  11/13/20 120/80   Wt Readings from Last 3 Encounters:  02/27/21 (!) 322 lb 12 oz (146.4 kg)  02/16/21 (!) 305 lb (138.3 kg)  11/13/20 (!) 302 lb 8 oz (137.2 kg)    BP 120/70   Pulse 64   Temp (!) 97.5 F (36.4 C) (Temporal)   Ht 5\' 11"  (1.803 m)   Wt (!) 322 lb 12 oz (146.4 kg)   SpO2 95%   BMI 45.01 kg/m    Physical Exam Constitutional:      Appearance: Normal appearance. He is not ill-appearing or diaphoretic.  HENT:     Right Ear: External ear normal.     Left Ear: External ear normal.     Nose: Nose normal.  Eyes:     General: No scleral icterus.    Extraocular Movements: Extraocular movements intact.     Conjunctiva/sclera: Conjunctivae normal.  Cardiovascular:     Rate and Rhythm: Normal rate.  Pulmonary:     Effort: Pulmonary effort is normal.  Musculoskeletal:     Cervical back: Neck supple.     Comments: Knee exam (right/left) Inspection: no effusion, no erythema b/l Palpation: TTP along the joint line medial > lateral, no patella ttp ROM: normal, with some crepitus on  the right Strength: normal b/l Ligaments: intact, though difficulty relaxing knee b/l  Skin:    General: Skin is warm and dry.  Neurological:     Mental Status: He is alert. Mental status is at baseline.  Psychiatric:        Mood and Affect: Mood normal.          Assessment & Plan:   Problem List Items Addressed This Visit       Other   Bilateral chronic knee pain - Primary    Discussed etiology likely arthritis given weight, age, and joint line pain. Advised trial of voltaren gel and home exercise (declined PT referral). Discussed deferring XR to f/u as would not change initial management. Advised f/u with Dr. Lorelei Pont if not improving.          Return in about 2 months (around 04/30/2021), or if symptoms worsen or fail to improve.  [name], MD  This visit occurred during the SARS-CoV-2 public health emergency.  Safety protocols were in place, including screening questions prior to the visit, additional usage of staff PPE, and extensive cleaning  of exam room while observing appropriate contact time as indicated for disinfecting solutions.

## 2021-03-01 ENCOUNTER — Other Ambulatory Visit: Payer: Self-pay | Admitting: Cardiology

## 2021-03-06 ENCOUNTER — Other Ambulatory Visit: Payer: Self-pay | Admitting: Urology

## 2021-03-06 DIAGNOSIS — E349 Endocrine disorder, unspecified: Secondary | ICD-10-CM

## 2021-03-06 MED ORDER — TESTOSTERONE 4 MG/24HR TD PT24
1.0000 | MEDICATED_PATCH | Freq: Every day | TRANSDERMAL | 0 refills | Status: DC
Start: 2021-03-06 — End: 2021-11-01

## 2021-03-06 NOTE — Progress Notes (Signed)
Script for Androderm sent to pharmacy.

## 2021-03-16 ENCOUNTER — Other Ambulatory Visit: Payer: Self-pay | Admitting: *Deleted

## 2021-03-16 DIAGNOSIS — E349 Endocrine disorder, unspecified: Secondary | ICD-10-CM

## 2021-04-02 ENCOUNTER — Other Ambulatory Visit: Payer: Self-pay | Admitting: Family Medicine

## 2021-04-03 ENCOUNTER — Encounter: Payer: Self-pay | Admitting: Family Medicine

## 2021-04-04 ENCOUNTER — Other Ambulatory Visit: Payer: Self-pay

## 2021-04-04 ENCOUNTER — Telehealth: Payer: Managed Care, Other (non HMO) | Admitting: Family Medicine

## 2021-04-04 ENCOUNTER — Encounter: Payer: Self-pay | Admitting: Family Medicine

## 2021-04-04 VITALS — Temp 100.0°F | Wt 320.0 lb

## 2021-04-04 DIAGNOSIS — U071 COVID-19: Secondary | ICD-10-CM

## 2021-04-04 MED ORDER — NIRMATRELVIR/RITONAVIR (PAXLOVID)TABLET: 3.0000 | ORAL_TABLET | Freq: Two times a day (BID) | ORAL | 0 refills | Status: AC

## 2021-04-04 NOTE — Patient Instructions (Signed)
Hold Atorvastatin 10 days  Monitor for low blood pressure or swelling - if present hold Amlodipine for time taking paxlovid

## 2021-04-04 NOTE — Progress Notes (Signed)
    I connected with [name] on 04/04/21 at  9:20 AM EDT by video and verified that I am speaking with the correct person using two identifiers.   I discussed the limitations, risks, security and privacy concerns of performing an evaluation and management service by video and the availability of in person appointments. I also discussed with the patient that there may be a patient responsible charge related to this service. The patient expressed understanding and agreed to proceed.  Patient location: Home Provider Location: [place] Participants: [name] and [name]   Subjective:     [name] is a 65 y.o. male presenting for Covid Positive (Onset of symptoms 04/02/21), Cough, Nausea, Generalized Body Aches, Fever (100 oral), and Nasal Congestion     Cough Associated symptoms include a fever, myalgias, a sore throat and shortness of breath. Pertinent negatives include no chest pain.  Fever  Associated symptoms include congestion, coughing, nausea and a sore throat. Pertinent negatives include no chest pain, diarrhea or vomiting.   #Covid - 19 - 04/02/2021 - cough, congestion - Treatment: Delsum, pepto-bismal    Took atorvastatin this morning  Review of Systems  Constitutional:  Positive for fever.  HENT:  Positive for congestion and sore throat.   Respiratory:  Positive for cough and shortness of breath.   Cardiovascular:  Negative for chest pain and palpitations.  Gastrointestinal:  Positive for nausea. Negative for diarrhea and vomiting.  Musculoskeletal:  Positive for arthralgias and myalgias.    Social History   Tobacco Use  Smoking Status Former   Packs/day: 0.50   Years: 13.00   Pack years: 6.50   Types: Cigarettes   Quit date: 06/15/1985   Years since quitting: 35.8  Smokeless Tobacco Never        Objective:   BP Readings from Last 3 Encounters:  02/27/21 120/70  02/16/21 123/75  11/13/20 120/80   Wt Readings from Last  3 Encounters:  04/04/21 (!) 320 lb (145.2 kg)  02/27/21 (!) 322 lb 12 oz (146.4 kg)  02/16/21 (!) 305 lb (138.3 kg)   Temp 100 F (37.8 C) (Oral)   Wt (!) 320 lb (145.2 kg)   BMI 44.63 kg/m   Physical Exam          Assessment & Plan:   Problem List Items Addressed This Visit   None Visit Diagnoses     COVID-19 virus infection    -  Primary   Relevant Medications   nirmatrelvir/ritonavir EUA (PAXLOVID) TABS      Patient is at increased risk for developing severe covid due to CAD, HTN, obesity. They are eligible for anti-viral medication.   Discussed Paxlovid and they would like to start. Normal liver and kidney function.   Lab Results  Component Value Date   ALT 16 02/13/2021   AST 24 02/13/2021   ALKPHOS 76 02/13/2021   BILITOT 0.3 02/13/2021    Lab Results  Component Value Date   CREATININE 1.05 11/13/2020   Discussed holding atorvastatin while on medication and 5 days after Also discussed monitoring for low bp or swelling and holding amlodipine if present  Medication sent to pharmacy  Reviewed ER and return precautions  Reviewed isolation guidelines.    Return if symptoms worsen or fail to improve.  [name], MD

## 2021-04-06 ENCOUNTER — Other Ambulatory Visit: Payer: Self-pay

## 2021-04-16 ENCOUNTER — Other Ambulatory Visit: Payer: Self-pay | Admitting: Family Medicine

## 2021-05-07 ENCOUNTER — Other Ambulatory Visit: Payer: Self-pay | Admitting: Cardiology

## 2021-05-28 ENCOUNTER — Other Ambulatory Visit: Payer: Self-pay | Admitting: Family Medicine

## 2021-06-05 ENCOUNTER — Encounter: Payer: Self-pay | Admitting: Family Medicine

## 2021-06-05 DIAGNOSIS — E039 Hypothyroidism, unspecified: Secondary | ICD-10-CM

## 2021-06-05 MED ORDER — LEVOTHYROXINE SODIUM 88 MCG PO TABS
88.0000 ug | ORAL_TABLET | Freq: Every day | ORAL | 1 refills | Status: DC
Start: 2021-06-05 — End: 2021-08-08

## 2021-06-22 ENCOUNTER — Other Ambulatory Visit: Payer: Self-pay | Admitting: Cardiology

## 2021-08-03 ENCOUNTER — Other Ambulatory Visit (HOSPITAL_BASED_OUTPATIENT_CLINIC_OR_DEPARTMENT_OTHER): Payer: Self-pay | Admitting: Orthopaedic Surgery

## 2021-08-03 DIAGNOSIS — M25562 Pain in left knee: Secondary | ICD-10-CM

## 2021-08-06 ENCOUNTER — Ambulatory Visit (HOSPITAL_BASED_OUTPATIENT_CLINIC_OR_DEPARTMENT_OTHER)
Admission: RE | Admit: 2021-08-06 | Discharge: 2021-08-06 | Disposition: A | Discharge status: Home / Self Care | Payer: Medicare HMO | Source: Ambulatory Visit | Attending: Orthopaedic Surgery | Admitting: Orthopaedic Surgery

## 2021-08-06 ENCOUNTER — Other Ambulatory Visit: Payer: Self-pay | Admitting: Cardiology

## 2021-08-06 ENCOUNTER — Other Ambulatory Visit: Payer: Self-pay

## 2021-08-06 ENCOUNTER — Ambulatory Visit (HOSPITAL_BASED_OUTPATIENT_CLINIC_OR_DEPARTMENT_OTHER): Payer: Medicare HMO | Admitting: Orthopaedic Surgery

## 2021-08-06 DIAGNOSIS — M1712 Unilateral primary osteoarthritis, left knee: Secondary | ICD-10-CM

## 2021-08-06 DIAGNOSIS — M25562 Pain in left knee: Secondary | ICD-10-CM | POA: Diagnosis not present
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
## 2021-08-08 ENCOUNTER — Other Ambulatory Visit: Payer: Self-pay

## 2021-08-08 ENCOUNTER — Encounter: Payer: Self-pay | Admitting: Cardiology

## 2021-08-08 ENCOUNTER — Encounter: Payer: Self-pay | Admitting: Family Medicine

## 2021-08-08 DIAGNOSIS — G479 Sleep disorder, unspecified: Secondary | ICD-10-CM

## 2021-08-08 DIAGNOSIS — E039 Hypothyroidism, unspecified: Secondary | ICD-10-CM

## 2021-08-08 MED ORDER — LEVOTHYROXINE SODIUM 88 MCG PO TABS: 88.0000 ug | ORAL_TABLET | Freq: Every day | ORAL | 0 refills | Status: DC

## 2021-08-08 MED ORDER — DULOXETINE HCL 60 MG PO CPEP: 60.0000 mg | ORAL_CAPSULE | Freq: Every morning | ORAL | 2 refills | Status: DC

## 2021-08-08 MED ORDER — BUPROPION HCL ER (XL) 300 MG PO TB24: 300.0000 mg | ORAL_TABLET | Freq: Every morning | ORAL | 0 refills | Status: DC

## 2021-08-08 MED ORDER — TRAZODONE HCL 100 MG PO TABS: 200.0000 mg | ORAL_TABLET | Freq: Every day | ORAL | 0 refills | Status: DC

## 2021-08-08 MED ORDER — GABAPENTIN 300 MG PO CAPS: ORAL_CAPSULE | ORAL | 2 refills | Status: DC

## 2021-08-10 MED ORDER — METOPROLOL SUCCINATE ER 25 MG PO TB24: 25.0000 mg | ORAL_TABLET | Freq: Every day | ORAL | 0 refills | Status: DC

## 2021-08-10 MED ORDER — ATORVASTATIN CALCIUM 40 MG PO TABS: 40.0000 mg | ORAL_TABLET | Freq: Every day | ORAL | 0 refills | Status: DC

## 2021-08-10 MED ORDER — AMLODIPINE BESYLATE 5 MG PO TABS: 5.0000 mg | ORAL_TABLET | Freq: Every day | ORAL | 0 refills | Status: DC

## 2021-08-10 MED ORDER — FUROSEMIDE 20 MG PO TABS: ORAL_TABLET | ORAL | 0 refills | Status: DC

## 2021-08-16 ENCOUNTER — Other Ambulatory Visit: Payer: Self-pay | Admitting: Family Medicine

## 2021-08-16 DIAGNOSIS — E039 Hypothyroidism, unspecified: Secondary | ICD-10-CM

## 2021-08-16 DIAGNOSIS — G479 Sleep disorder, unspecified: Secondary | ICD-10-CM

## 2021-08-20 ENCOUNTER — Encounter: Payer: Self-pay | Admitting: Family Medicine

## 2021-08-21 ENCOUNTER — Ambulatory Visit: Payer: Managed Care, Other (non HMO) | Admitting: Family

## 2021-08-21 ENCOUNTER — Other Ambulatory Visit: Payer: Self-pay

## 2021-08-21 ENCOUNTER — Ambulatory Visit: Payer: Medicare HMO | Admitting: Family Medicine

## 2021-08-21 VITALS — BP 158/92 | HR 57 | Ht 71.0 in | Wt 316.0 lb

## 2021-08-21 DIAGNOSIS — S41102A Unspecified open wound of left upper arm, initial encounter: Secondary | ICD-10-CM | POA: Diagnosis not present

## 2021-08-21 DIAGNOSIS — L905 Scar conditions and fibrosis of skin: Secondary | ICD-10-CM | POA: Diagnosis not present
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
## 2021-08-21 NOTE — Patient Instructions (Signed)
Thank you for coming in today.   Refer you to wound care.  Apply non-adherent dressing and Visiline.  Recheck back as needed

## 2021-08-28 ENCOUNTER — Other Ambulatory Visit: Payer: Self-pay

## 2021-08-28 ENCOUNTER — Encounter: Payer: Medicare HMO | Attending: Physician Assistant | Admitting: Physician Assistant

## 2021-08-28 DIAGNOSIS — I2510 Atherosclerotic heart disease of native coronary artery without angina pectoris: Secondary | ICD-10-CM | POA: Diagnosis not present

## 2021-08-28 DIAGNOSIS — L988 Other specified disorders of the skin and subcutaneous tissue: Secondary | ICD-10-CM | POA: Insufficient documentation

## 2021-08-28 DIAGNOSIS — L98492 Non-pressure chronic ulcer of skin of other sites with fat layer exposed: Secondary | ICD-10-CM | POA: Diagnosis not present

## 2021-08-28 DIAGNOSIS — I10 Essential (primary) hypertension: Secondary | ICD-10-CM | POA: Insufficient documentation
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
## 2021-08-28 NOTE — Progress Notes (Signed)
SHERLOCK, NANCARROW ([id number]) Visit Report for 08/28/2021 Abuse/Suicide Risk Screen Details Patient Name: [name], [name] Date of Service: 08/28/2021 8:45 AM Medical Record Number: [id number] Patient Account Number: [id number] Date of Birth/Sex: [birth year] (66 y.o. M) Treating RN: Carlene Coria Primary Care Alekhya Gravlin: Waunita Schooner Other Clinician: Referring Elon Eoff: Lynne Leader Treating Carlee Vonderhaar/Extender: Skipper Cliche in Treatment: 0 Abuse/Suicide Risk Screen Items Answer ABUSE RISK SCREEN: Has anyone close to you tried to hurt or harm you recentlyo No Do you feel uncomfortable with anyone in your familyo No Has anyone forced you do things that you didnot want to doo No Electronic Signature(s) Signed: 08/28/2021 3:55:11 PM By: Carlene Coria RN Entered By: Carlene Coria on 08/28/2021 [place], Louie Casa ([id number]) -------------------------------------------------------------------------------- Activities of Daily Living Details Patient Name: [name] Date of Service: 08/28/2021 8:45 AM Medical Record Number: [id number] Patient Account Number: [id number] Date of Birth/Sex: Aug 25, 1956 (66 y.o. M) Treating RN: Carlene Coria Primary Care Embry Manrique: Waunita Schooner Other Clinician: Referring Jayveon Convey: Lynne Leader Treating Oreste Majeed/Extender: Skipper Cliche in Treatment: 0 Activities of Daily Living Items Answer Activities of Daily Living (Please select one for each item) Drive Automobile Completely Able Take Medications Completely Able Use Telephone Completely Able Care for Appearance Completely Able Use Toilet Completely Able Bath / Shower Completely Able Dress Self Completely Able Feed Self Completely Able Walk Completely Able Get In / Out Bed Completely Able Housework Completely Able Prepare Meals Completely [place] for Self Completely Able Electronic Signature(s) Signed: 08/28/2021 3:55:11 PM By: Carlene Coria RN Entered By: Carlene Coria on  08/28/2021 08:47:23 [place], Louie Casa ([id number]) -------------------------------------------------------------------------------- Education Screening Details Patient Name: [name] Date of Service: 08/28/2021 8:45 AM Medical Record Number: [id number] Patient Account Number: [id number] Date of Birth/Sex: [birth year] (66 y.o. M) Treating RN: Carlene Coria Primary Care Tonja Jezewski: Waunita Schooner Other Clinician: Referring Jaeger Trueheart: Lynne Leader Treating Kelwin Gibler/Extender: Skipper Cliche in Treatment: 0 Primary Learner Assessed: Patient Learning Preferences/Education Level/Primary Language Learning Preference: Explanation Highest Education Level: College or Above Preferred Language: English Cognitive Barrier Language Barrier: No Translator Needed: No Memory Deficit: No Emotional Barrier: No Cultural/Religious Beliefs Affecting Medical Care: No Physical Barrier Impaired Vision: Yes Glasses Impaired Hearing: No Decreased Hand dexterity: No Knowledge/Comprehension Knowledge Level: Medium Comprehension Level: High Ability to understand written instructions: High Ability to understand verbal instructions: High Motivation Anxiety Level: Anxious Cooperation: Cooperative Education Importance: Acknowledges Need Interest in Health Problems: Asks Questions Perception: Coherent Willingness to Engage in Self-Management High Activities: Readiness to Engage in Self-Management High Activities: Electronic Signature(s) Signed: 08/28/2021 3:55:11 PM By: Carlene Coria RN Entered By: Carlene Coria on 08/28/2021 08:47:53 [place], Louie Casa ([id number]) -------------------------------------------------------------------------------- Fall Risk Assessment Details Patient Name: [name] Date of Service: 08/28/2021 8:45 AM Medical Record Number: [id number] Patient Account Number: [id number] Date of Birth/Sex: Dec 09, 1955 (66 y.o. M) Treating RN: Carlene Coria Primary Care Jemiah Ellenburg: Waunita Schooner Other  Clinician: Referring Helton Oleson: Lynne Leader Treating Phil Corti/Extender: Skipper Cliche in Treatment: 0 Fall Risk Assessment Items Have you had 2 or more falls in the last 12 monthso 0 No Have you had any fall that resulted in injury in the last 12 monthso 0 No FALLS RISK SCREEN History of falling - immediate or within 3 months 0 No Secondary diagnosis (Do you have 2 or more medical diagnoseso) 0 No Ambulatory aid None/bed rest/wheelchair/nurse 0 No Crutches/cane/walker 0 No Furniture 0 No Intravenous therapy Access/Saline/Heparin Lock 0 No Gait/Transferring Normal/ bed rest/ wheelchair 0 No Weak (short steps with or without shuffle, stooped but able to  lift head while walking, may 0 No seek support from furniture) Impaired (short steps with shuffle, may have difficulty arising from chair, head down, impaired 0 No balance) Mental Status Oriented to own ability 0 No Electronic Signature(s) Signed: 08/28/2021 3:55:11 PM By: Carlene Coria RN Entered By: Carlene Coria on 08/28/2021 08:48:06 [place], Louie Casa ([id number]) -------------------------------------------------------------------------------- Foot Assessment Details Patient Name: [name] Date of Service: 08/28/2021 8:45 AM Medical Record Number: [id number] Patient Account Number: [id number] Date of Birth/Sex: Feb 22, 1956 (66 y.o. M) Treating RN: Carlene Coria Primary Care Kathrina Crosley: Waunita Schooner Other Clinician: Referring Alverta Caccamo: Lynne Leader Treating Dekota Shenk/Extender: Skipper Cliche in Treatment: 0 Foot Assessment Items Site Locations + = Sensation present, - = Sensation absent, C = Callus, U = Ulcer R = Redness, W = Warmth, M = Maceration, PU = Pre-ulcerative lesion F = Fissure, S = Swelling, D = Dryness Assessment Right: Left: Other Deformity: No No Prior Foot Ulcer: No No Prior Amputation: No No Charcot Joint: No No Ambulatory Status: Ambulatory Without Help Gait: Steady Electronic Signature(s) Signed:  08/28/2021 3:55:11 PM By: Carlene Coria RN Entered By: Carlene Coria on 08/28/2021 08:48:28 [place], Louie Casa ([id number]) -------------------------------------------------------------------------------- Nutrition Risk Screening Details Patient Name: [name] Date of Service: 08/28/2021 8:45 AM Medical Record Number: [id number] Patient Account Number: [id number] Date of Birth/Sex: 12-10-1955 (65 y.o. M) Treating RN: Carlene Coria Primary Care Gabriellah Rabel: Waunita Schooner Other Clinician: Referring Ainsley Sanguinetti: Lynne Leader Treating Torell Minder/Extender: Skipper Cliche in Treatment: 0 Height (in): 71 Weight (lbs): 312 Body Mass Index (BMI): 43.5 Nutrition Risk Screening Items Score Screening NUTRITION RISK SCREEN: I have an illness or condition that made me change the kind and/or amount of food I eat 0 No I eat fewer than two meals per day 0 No I eat few fruits and vegetables, or milk products 0 No I have three or more drinks of beer, liquor or wine almost every day 0 No I have tooth or mouth problems that make it hard for me to eat 0 No I don't always have enough money to buy the food I need 0 No I eat alone most of the time 0 No I take three or more different prescribed or over-the-counter drugs a day 1 Yes Without wanting to, I have lost or gained 10 pounds in the last six months 0 No I am not always physically able to shop, cook and/or feed myself 0 No Nutrition Protocols Good Risk Protocol 0 No interventions needed Moderate Risk Protocol High Risk Proctocol Risk Level: Good Risk Score: 1 Electronic Signature(s) Signed: 08/28/2021 3:55:11 PM By: Carlene Coria RN Entered By: Carlene Coria on 08/28/2021 08:48:20

## 2021-08-28 NOTE — Progress Notes (Signed)
[name] ([id number]) Visit Report for 08/28/2021 Allergy List Details Patient Name: [name], [name] Date of Service: 08/28/2021 8:45 AM Medical Record Number: [id number] Patient Account Number: [id number] Date of Birth/Sex: 1956/03/29 (66 y.o. M) Treating RN: Carlene Coria Primary Care Julitza Rickles: Waunita Schooner Other Clinician: Referring Jakoby Melendrez: Lynne Leader Treating Darell Saputo/Extender: Jeri Cos Weeks in Treatment: 0 Allergies Active Allergies amoxicillin penicillin Allergy Notes Electronic Signature(s) Signed: 08/28/2021 3:55:11 PM By: Carlene Coria RN Entered By: Carlene Coria on 08/28/2021 08:43:10 [place], Louie Casa ([id number]) -------------------------------------------------------------------------------- Arrival Information Details Patient Name: [name] Date of Service: 08/28/2021 8:45 AM Medical Record Number: [id number] Patient Account Number: [id number] Date of Birth/Sex: May 31, 1956 (65 y.o. M) Treating RN: Carlene Coria Primary Care Ayaana Biondo: Waunita Schooner Other Clinician: Referring Marqui Formby: Lynne Leader Treating Inna Tisdell/Extender: Skipper Cliche in Treatment: 0 Visit Information Patient Arrived: Ambulatory Arrival Time: 08:39 Accompanied By: wife Transfer Assistance: None Patient Identification Verified: Yes Secondary Verification Process Completed: Yes Electronic Signature(s) Signed: 08/28/2021 3:55:11 PM By: Carlene Coria RN Entered By: Carlene Coria on 08/28/2021 08:39:39 [place], Louie Casa ([id number]) -------------------------------------------------------------------------------- Clinic Level of Care Assessment Details Patient Name: [name] Date of Service: 08/28/2021 8:45 AM Medical Record Number: [id number] Patient Account Number: [id number] Date of Birth/Sex: Apr 01, 1956 (66 y.o. M) Treating RN: Carlene Coria Primary Care Iyanla Eilers: Waunita Schooner Other Clinician: Referring Raylen Ken: Lynne Leader Treating Samrat Hayward/Extender: Skipper Cliche in Treatment:  0 Clinic Level of Care Assessment Items TOOL 1 Quantity Score X - Use when EandM and Procedure is performed on INITIAL visit 1 0 ASSESSMENTS - Nursing Assessment / Reassessment X - General Physical Exam (combine w/ comprehensive assessment (listed just below) when performed on new 1 20 pt. evals) X- 1 25 Comprehensive Assessment (HX, ROS, Risk Assessments, Wounds Hx, etc.) ASSESSMENTS - Wound and Skin Assessment / Reassessment []  - Dermatologic / Skin Assessment (not related to wound area) 0 ASSESSMENTS - Ostomy and/or Continence Assessment and Care []  - Incontinence Assessment and Management 0 []  - 0 Ostomy Care Assessment and Management (repouching, etc.) PROCESS - Coordination of Care X - Simple Patient / Family Education for ongoing care 1 15 []  - 0 Complex (extensive) Patient / Family Education for ongoing care X- 1 10 Staff obtains Programmer, systems, Records, Test Results / Process Orders []  - 0 Staff telephones HHA, Nursing Homes / Clarify orders / etc []  - 0 Routine Transfer to another Facility (non-emergent condition) []  - 0 Routine Hospital Admission (non-emergent condition) X- 1 15 New Admissions / Biomedical engineer / Ordering NPWT, Apligraf, etc. []  - 0 Emergency Hospital Admission (emergent condition) PROCESS - Special Needs []  - Pediatric / Minor Patient Management 0 []  - 0 Isolation Patient Management []  - 0 Hearing / Language / Visual special needs []  - 0 Assessment of Community assistance (transportation, D/C planning, etc.) []  - 0 Additional assistance / Altered mentation []  - 0 Support Surface(s) Assessment (bed, cushion, seat, etc.) INTERVENTIONS - Miscellaneous []  - External ear exam 0 []  - 0 Patient Transfer (multiple staff / Civil Service fast streamer / Similar devices) []  - 0 Simple Staple / Suture removal (25 or less) []  - 0 Complex Staple / Suture removal (26 or more) []  - 0 Hypo/Hyperglycemic Management (do not check if billed separately) []  - 0 Ankle /  Brachial Index (ABI) - do not check if billed separately Has the patient been seen at the hospital within the last three years: Yes Total Score: 85 Level Of Care: New/Established - Level 3 Almendarez, Earley ([id number]) Electronic Signature(s) Signed: 08/28/2021 3:55:11 PM By: Carlene Coria  RN Entered By: Carlene Coria on 08/28/2021 09:05:42 [name] ([id number]) -------------------------------------------------------------------------------- Encounter Discharge Information Details Patient Name: [name] Date of Service: 08/28/2021 8:45 AM Medical Record Number: [id number] Patient Account Number: [id number] Date of Birth/Sex: Jan 10, 1956 (66 y.o. M) Treating RN: Carlene Coria Primary Care Kassady Laboy: Waunita Schooner Other Clinician: Referring Bocephus Cali: Lynne Leader Treating Edris Schneck/Extender: Skipper Cliche in Treatment: 0 Encounter Discharge Information Items Post Procedure Vitals Discharge Condition: Stable Temperature (F): 97.7 Ambulatory Status: Ambulatory Pulse (bpm): 55 Discharge Destination: Home Respiratory Rate (breaths/min): 18 Transportation: Private Auto Blood Pressure (mmHg): 131/79 Accompanied By: wife Schedule Follow-up Appointment: Yes Clinical Summary of Care: Patient Declined Electronic Signature(s) Signed: 08/28/2021 3:55:11 PM By: Carlene Coria RN Entered By: Carlene Coria on 08/28/2021 09:20:51 [name] ([id number]) -------------------------------------------------------------------------------- Lower Extremity Assessment Details Patient Name: [name] Date of Service: 08/28/2021 8:45 AM Medical Record Number: [id number] Patient Account Number: [id number] Date of Birth/Sex: 01-21-1956 (65 y.o. M) Treating RN: Carlene Coria Primary Care Everette Dimauro: Waunita Schooner Other Clinician: Referring Vayda Dungee: Lynne Leader Treating Raela Bohl/Extender: Skipper Cliche in Treatment: 0 Electronic Signature(s) Signed: 08/28/2021 3:55:11 PM By: Carlene Coria RN Entered  By: Carlene Coria on 08/28/2021 08:48:49 [place], Louie Casa ([id number]) -------------------------------------------------------------------------------- Multi Wound Chart Details Patient Name: [name] Date of Service: 08/28/2021 8:45 AM Medical Record Number: [id number] Patient Account Number: [id number] Date of Birth/Sex: [birth year] (65 y.o. M) Treating RN: Carlene Coria Primary Care Halona Amstutz: Waunita Schooner Other Clinician: Referring Shante Maysonet: Lynne Leader Treating Rania Prothero/Extender: Skipper Cliche in Treatment: 0 Vital Signs Height(in): 71 Pulse(bpm): 55 Weight(lbs): 312 Blood Pressure(mmHg): 131/79 Body Mass Index(BMI): 44 Temperature(F): 97.7 Respiratory Rate(breaths/min): 18 Photos: [N/A:N/A] Wound Location: Left Chest N/A N/A Wounding Event: Trauma N/A N/A Primary Etiology: Trauma, Other N/A N/A Comorbid History: Coronary Artery Disease, N/A N/A Hypertension Date Acquired: 07/18/2021 N/A N/A Weeks of Treatment: 0 N/A N/A Wound Status: Open N/A N/A Measurements L x W x D (cm) 3.5x1.3x0.1 N/A N/A Area (cm) : 3.574 N/A N/A Volume (cm) : 0.357 N/A N/A Classification: Full Thickness Without Exposed N/A N/A Support Structures Exudate Amount: Medium N/A N/A Exudate Type: Serosanguineous N/A N/A Exudate Color: red, brown N/A N/A Granulation Amount: Medium (34-66%) N/A N/A Granulation Quality: Pink, Pale N/A N/A Necrotic Amount: Medium (34-66%) N/A N/A Exposed Structures: Fat Layer (Subcutaneous Tissue): N/A N/A Yes Fascia: No Tendon: No Muscle: No Joint: No Bone: No Epithelialization: None N/A N/A Treatment Notes Electronic Signature(s) Signed: 08/28/2021 3:55:11 PM By: Carlene Coria RN Entered By: Carlene Coria on 08/28/2021 09:02:58 [name] ([id number]) -------------------------------------------------------------------------------- [place] Details Patient Name: [name] Date of Service: 08/28/2021 8:45 AM Medical Record Number:  [id number] Patient Account Number: [id number] Date of Birth/Sex: 1955-09-14 (65 y.o. M) Treating RN: Carlene Coria Primary Care Finlee Concepcion: Waunita Schooner Other Clinician: Referring Eden Toohey: Lynne Leader Treating Brynnlee Cumpian/Extender: Skipper Cliche in Treatment: 0 Active Inactive Wound/Skin Impairment Nursing Diagnoses: Knowledge deficit related to ulceration/compromised skin integrity Goals: Patient/caregiver will verbalize understanding of skin care regimen Date Initiated: 08/28/2021 Target Resolution Date: 09/28/2021 Goal Status: Active Ulcer/skin breakdown will have a volume reduction of 30% by week 4 Date Initiated: 08/28/2021 Target Resolution Date: 09/28/2021 Goal Status: Active Ulcer/skin breakdown will have a volume reduction of 50% by week 8 Date Initiated: 08/28/2021 Target Resolution Date: 10/26/2021 Goal Status: Active Ulcer/skin breakdown will have a volume reduction of 80% by week 12 Date Initiated: 08/28/2021 Target Resolution Date: 11/26/2021 Goal Status: Active Ulcer/skin breakdown will heal within 14 weeks Date Initiated: 08/28/2021 Target Resolution Date: 12/26/2021 Goal Status: Active Interventions:  Assess patient/caregiver ability to obtain necessary supplies Assess patient/caregiver ability to perform ulcer/skin care regimen upon admission and as needed Assess ulceration(s) every visit Notes: Electronic Signature(s) Signed: 08/28/2021 3:55:11 PM By: Carlene Coria RN Entered By: Carlene Coria on 08/28/2021 09:02:40 [place], Louie Casa ([id number]) -------------------------------------------------------------------------------- Pain Assessment Details Patient Name: [name] Date of Service: 08/28/2021 8:45 AM Medical Record Number: [id number] Patient Account Number: [id number] Date of Birth/Sex: 08-19-1956 (65 y.o. M) Treating RN: Carlene Coria Primary Care Jake Fuhrmann: Waunita Schooner Other Clinician: Referring Ambra Haverstick: Lynne Leader Treating Roran Wegner/Extender: Skipper Cliche in  Treatment: 0 Active Problems Location of Pain Severity and Description of Pain Patient Has Paino Yes Site Locations With Dressing Change: Yes Duration of the Pain. Constant / Intermittento Constant Rate the pain. Current Pain Level: 5 Worst Pain Level: 10 Least Pain Level: 5 Tolerable Pain Level: 0 Character of Pain Describe the Pain: Aching Pain Management and Medication Current Pain Management: Medication: Yes Cold Application: No Rest: Yes Massage: No Activity: No T.E.N.S.: No Heat Application: No Leg drop or elevation: No Is the Current Pain Management Adequate: Inadequate How does your wound impact your activities of daily livingo Sleep: Yes Appetite: No Electronic Signature(s) Signed: 08/28/2021 3:55:11 PM By: Carlene Coria RN Entered By: Carlene Coria on 08/28/2021 08:42:08 [name] ([id number]) -------------------------------------------------------------------------------- Patient/Caregiver Education Details Patient Name: [name] Date of Service: 08/28/2021 8:45 AM Medical Record Number: [id number] Patient Account Number: [id number] Date of Birth/Gender: 1956/03/31 (66 y.o. M) Treating RN: Carlene Coria Primary Care Physician: Waunita Schooner Other Clinician: Referring Physician: Lynne Leader Treating Physician/Extender: Skipper Cliche in Treatment: 0 Education Assessment Education Provided To: Patient Education Topics Provided Wound/Skin Impairment: Methods: Explain/Verbal Responses: State content correctly Electronic Signature(s) Signed: 08/28/2021 3:55:11 PM By: Carlene Coria RN Entered By: Carlene Coria on 08/28/2021 09:19:53 Giorgio, Louie Casa ([id number]) -------------------------------------------------------------------------------- Wound Assessment Details Patient Name: [name] Date of Service: 08/28/2021 8:45 AM Medical Record Number: [id number] Patient Account Number: [id number] Date of Birth/Sex: June 17, 1956 (65 y.o. M) Treating RN: Carlene Coria Primary Care Larence Thone: Waunita Schooner Other Clinician: Referring Malon Siddall: Lynne Leader Treating Palak Tercero/Extender: Skipper Cliche in Treatment: 0 Wound Status Wound Number: 1 Primary Etiology: Trauma, Other Wound Location: Left Chest Wound Status: Open Wounding Event: Trauma Comorbid History: Coronary Artery Disease, Hypertension Date Acquired: 07/18/2021 Weeks Of Treatment: 0 Clustered Wound: No Photos Wound Measurements Length: (cm) 3.5 Width: (cm) 1.3 Depth: (cm) 0.1 Area: (cm) 3.574 Volume: (cm) 0.357 % Reduction in Area: % Reduction in Volume: Epithelialization: None Tunneling: No Undermining: No Wound Description Classification: Full Thickness Without Exposed Support Structures Exudate Amount: Medium Exudate Type: Serosanguineous Exudate Color: red, brown Foul Odor After Cleansing: No Slough/Fibrino Yes Wound Bed Granulation Amount: Medium (34-66%) Exposed Structure Granulation Quality: Pink, Pale Fascia Exposed: No Necrotic Amount: Medium (34-66%) Fat Layer (Subcutaneous Tissue) Exposed: Yes Necrotic Quality: Adherent Slough Tendon Exposed: No Muscle Exposed: No Joint Exposed: No Bone Exposed: No Treatment Notes Wound #1 (Chest) Wound Laterality: Left Cleanser Soap and Water Discharge Instruction: Gently cleanse wound with antibacterial soap, rinse and pat dry prior to dressing wounds Peri-Wound Care [place], Louie Casa ([id number]) Topical eucerin Discharge Instruction: apply to periwound Primary Dressing Xeroform 4x4-HBD (in/in) Discharge Instruction: Apply Xeroform 4x4-HBD (in/in) as directed Secondary Dressing ABD Pad 5x9 (in/in) Discharge Instruction: Cover with ABD pad Secured With 22M Medipore H Soft Cloth Surgical Tape, 2x2 (in/yd) Compression Wrap Compression Stockings Add-Ons Electronic Signature(s) Signed: 08/28/2021 3:55:11 PM By: Carlene Coria RN Entered By: Carlene Coria on 08/28/2021 08:54:00 Kneip, Kashtyn  ([id number]) --------------------------------------------------------------------------------  Vitals Details Patient Name: ALCARIO, TINKEY Date of Service: 08/28/2021 8:45 AM Medical Record Number: [id number] Patient Account Number: [id number] Date of Birth/Sex: 11/26/1955 (66 y.o. M) Treating RN: Carlene Coria Primary Care Cleotis Sparr: Waunita Schooner Other Clinician: Referring Gianelle Mccaul: Lynne Leader Treating Dawud Mays/Extender: Skipper Cliche in Treatment: 0 Vital Signs Time Taken: 08:42 Temperature (F): 97.7 Height (in): 71 Pulse (bpm): 55 Source: Stated Respiratory Rate (breaths/min): 18 Weight (lbs): 312 Blood Pressure (mmHg): 131/79 Source: Stated Reference Range: 80 - 120 mg / dl Body Mass Index (BMI): 43.5 Electronic Signature(s) Signed: 08/28/2021 3:55:11 PM By: Carlene Coria RN Entered By: Carlene Coria on 08/28/2021 08:42:51

## 2021-08-30 ENCOUNTER — Other Ambulatory Visit: Payer: Self-pay | Admitting: Family Medicine

## 2021-08-30 DIAGNOSIS — E039 Hypothyroidism, unspecified: Secondary | ICD-10-CM

## 2021-09-03 DIAGNOSIS — H43812 Vitreous degeneration, left eye: Secondary | ICD-10-CM | POA: Diagnosis not present

## 2021-09-04 ENCOUNTER — Other Ambulatory Visit: Payer: Self-pay

## 2021-09-04 ENCOUNTER — Encounter: Payer: Medicare HMO | Admitting: Physician Assistant

## 2021-09-04 DIAGNOSIS — L988 Other specified disorders of the skin and subcutaneous tissue: Secondary | ICD-10-CM | POA: Diagnosis not present

## 2021-09-04 DIAGNOSIS — L98492 Non-pressure chronic ulcer of skin of other sites with fat layer exposed: Secondary | ICD-10-CM | POA: Diagnosis not present

## 2021-09-04 DIAGNOSIS — I10 Essential (primary) hypertension: Secondary | ICD-10-CM | POA: Diagnosis not present

## 2021-09-04 DIAGNOSIS — I2510 Atherosclerotic heart disease of native coronary artery without angina pectoris: Secondary | ICD-10-CM | POA: Diagnosis not present
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
## 2021-09-06 NOTE — Progress Notes (Signed)
[name], [name] ([id number]) Visit Report for 09/04/2021 Arrival Information Details Patient Name: [name], [name] Date of Service: 09/04/2021 10:15 AM Medical Record Number: [id number] Patient Account Number: [id number] Date of Birth/Sex: August 23, 1956 (66 y.o. M) Treating RN: Carlene Coria Primary Care Remee Charley: Waunita Schooner Other Clinician: Referring Dwight Adamczak: Waunita Schooner Treating Missey Hasley/Extender: Skipper Cliche in Treatment: 1 Visit Information History Since Last Visit All ordered tests and consults were completed: No Patient Arrived: Ambulatory Added or deleted any medications: No Arrival Time: 10:18 Any new allergies or adverse reactions: No Accompanied By: self Had a fall or experienced change in No Transfer Assistance: None activities of daily living that may affect Patient Identification Verified: Yes risk of falls: Secondary Verification Process Completed: Yes Signs or symptoms of abuse/neglect since last visito No Patient Requires Transmission-Based Precautions: No Hospitalized since last visit: No Patient Has Alerts: No Implantable device outside of the clinic excluding No cellular tissue based products placed in the center since last visit: Has Dressing in Place as Prescribed: Yes Pain Present Now: No Electronic Signature(s) Signed: 09/05/2021 6:47:24 PM By: Carlene Coria RN Entered By: Carlene Coria on 09/04/2021 10:23:59 [name] ([id number]) -------------------------------------------------------------------------------- Clinic Level of Care Assessment Details Patient Name: [name] Date of Service: 09/04/2021 10:15 AM Medical Record Number: [id number] Patient Account Number: [id number] Date of Birth/Sex: December 12, 1955 (66 y.o. M) Treating RN: Carlene Coria Primary Care Tom Ragsdale: Waunita Schooner Other Clinician: Referring Genean Adamski: Waunita Schooner Treating Roneisha Stern/Extender: Skipper Cliche in Treatment: 1 Clinic Level of Care Assessment Items TOOL 1  Quantity Score []  - Use when EandM and Procedure is performed on INITIAL visit 0 ASSESSMENTS - Nursing Assessment / Reassessment []  - General Physical Exam (combine w/ comprehensive assessment (listed just below) when performed on new 0 pt. evals) []  - 0 Comprehensive Assessment (HX, ROS, Risk Assessments, Wounds Hx, etc.) ASSESSMENTS - Wound and Skin Assessment / Reassessment []  - Dermatologic / Skin Assessment (not related to wound area) 0 ASSESSMENTS - Ostomy and/or Continence Assessment and Care []  - Incontinence Assessment and Management 0 []  - 0 Ostomy Care Assessment and Management (repouching, etc.) PROCESS - Coordination of Care []  - Simple Patient / Family Education for ongoing care 0 []  - 0 Complex (extensive) Patient / Family Education for ongoing care []  - 0 Staff obtains Programmer, systems, Records, Test Results / Process Orders []  - 0 Staff telephones HHA, Nursing Homes / Clarify orders / etc []  - 0 Routine Transfer to another Facility (non-emergent condition) []  - 0 Routine Hospital Admission (non-emergent condition) []  - 0 New Admissions / Biomedical engineer / Ordering NPWT, Apligraf, etc. []  - 0 Emergency Hospital Admission (emergent condition) PROCESS - Special Needs []  - Pediatric / Minor Patient Management 0 []  - 0 Isolation Patient Management []  - 0 Hearing / Language / Visual special needs []  - 0 Assessment of Community assistance (transportation, D/C planning, etc.) []  - 0 Additional assistance / Altered mentation []  - 0 Support Surface(s) Assessment (bed, cushion, seat, etc.) INTERVENTIONS - Miscellaneous []  - External ear exam 0 []  - 0 Patient Transfer (multiple staff / Civil Service fast streamer / Similar devices) []  - 0 Simple Staple / Suture removal (25 or less) []  - 0 Complex Staple / Suture removal (26 or more) []  - 0 Hypo/Hyperglycemic Management (do not check if billed separately) []  - 0 Ankle / Brachial Index (ABI) - do not check if billed  separately Has the patient been seen at the hospital within the last three years: Yes Total Score: 0 Level Of Care: ____ Rosita Fire  [place] ([id number]) Electronic Signature(s) Signed: 09/05/2021 6:47:24 PM By: Carlene Coria RN Entered By: Carlene Coria on 09/04/2021 [place], [place] ([id number]) -------------------------------------------------------------------------------- Encounter Discharge Information Details Patient Name: [name] Date of Service: 09/04/2021 10:15 AM Medical Record Number: [id number] Patient Account Number: [id number] Date of Birth/Sex: 1956/01/22 (65 y.o. M) Treating RN: Carlene Coria Primary Care Siddarth Hsiung: Waunita Schooner Other Clinician: Referring Theron Cumbie: Waunita Schooner Treating Gigi Onstad/Extender: Skipper Cliche in Treatment: 1 Encounter Discharge Information Items Post Procedure Vitals Discharge Condition: Stable Temperature (F): 97.9 Ambulatory Status: Ambulatory Pulse (bpm): 57 Discharge Destination: Home Respiratory Rate (breaths/min): 18 Transportation: Private Auto Blood Pressure (mmHg): 145/87 Accompanied By: self Schedule Follow-up Appointment: Yes Clinical Summary of Care: Patient Declined Electronic Signature(s) Signed: 09/05/2021 6:47:24 PM By: Carlene Coria RN Entered By: Carlene Coria on 09/04/2021 11:11:48 [name] ([id number]) -------------------------------------------------------------------------------- Lower Extremity Assessment Details Patient Name: [name] Date of Service: 09/04/2021 10:15 AM Medical Record Number: [id number] Patient Account Number: [id number] Date of Birth/Sex: [birth year] (65 y.o. M) Treating RN: Carlene Coria Primary Care Danise Dehne: Waunita Schooner Other Clinician: Referring Myles Mallicoat: Waunita Schooner Treating Lareina Espino/Extender: Jeri Cos Weeks in Treatment: 1 Electronic Signature(s) Signed: 09/05/2021 6:47:24 PM By: Carlene Coria RN Entered By: Carlene Coria on 09/04/2021 10:30:49 [name]  ([id number]) -------------------------------------------------------------------------------- Multi Wound Chart Details Patient Name: [name] Date of Service: 09/04/2021 10:15 AM Medical Record Number: [id number] Patient Account Number: [id number] Date of Birth/Sex: [birth year] (65 y.o. M) Treating RN: Carlene Coria Primary Care Brynlynn Walko: Waunita Schooner Other Clinician: Referring Brandn Mcgath: Waunita Schooner Treating Katelan Hirt/Extender: Skipper Cliche in Treatment: 1 Vital Signs Height(in): 71 Pulse(bpm): 66 Weight(lbs): 312 Blood Pressure(mmHg): 145/87 Body Mass Index(BMI): 44 Temperature(F): 97.9 Respiratory Rate(breaths/min): 18 Photos: [N/A:N/A] Wound Location: Left Chest N/A N/A Wounding Event: Trauma N/A N/A Primary Etiology: Trauma, Other N/A N/A Comorbid History: Coronary Artery Disease, N/A N/A Hypertension Date Acquired: 07/18/2021 N/A N/A Weeks of Treatment: 1 N/A N/A Wound Status: Open N/A N/A Measurements L x W x D (cm) 3x1x0.1 N/A N/A Area (cm) : 2.356 N/A N/A Volume (cm) : 0.236 N/A N/A % Reduction in Area: 34.10% N/A N/A % Reduction in Volume: 33.90% N/A N/A Classification: Full Thickness Without Exposed N/A N/A Support Structures Exudate Amount: Medium N/A N/A Exudate Type: Serosanguineous N/A N/A Exudate Color: red, brown N/A N/A Granulation Amount: Medium (34-66%) N/A N/A Granulation Quality: Pink, Pale N/A N/A Necrotic Amount: Medium (34-66%) N/A N/A Exposed Structures: Fat Layer (Subcutaneous Tissue): N/A N/A Yes Fascia: No Tendon: No Muscle: No Joint: No Bone: No Epithelialization: None N/A N/A Treatment Notes Electronic Signature(s) Signed: 09/05/2021 6:47:24 PM By: Carlene Coria RN Entered By: Carlene Coria on 09/04/2021 10:58:14 [name] ([id number]) -------------------------------------------------------------------------------- Multi-Disciplinary Care Plan Details Patient Name: [name] Date of Service: 09/04/2021 10:15  AM Medical Record Number: [id number] Patient Account Number: [id number] Date of Birth/Sex: June 05, 1956 (65 y.o. M) Treating RN: Carlene Coria Primary Care Adonay Scheier: Waunita Schooner Other Clinician: Referring Kindred Heying: Waunita Schooner Treating Yovani Cogburn/Extender: Skipper Cliche in Treatment: 1 Active Inactive Wound/Skin Impairment Nursing Diagnoses: Knowledge deficit related to ulceration/compromised skin integrity Goals: Patient/caregiver will verbalize understanding of skin care regimen Date Initiated: 08/28/2021 Target Resolution Date: 09/28/2021 Goal Status: Active Ulcer/skin breakdown will have a volume reduction of 30% by week 4 Date Initiated: 08/28/2021 Target Resolution Date: 09/28/2021 Goal Status: Active Ulcer/skin breakdown will have a volume reduction of 50% by week 8 Date Initiated: 08/28/2021 Target Resolution Date: 10/26/2021 Goal Status: Active Ulcer/skin breakdown will have a volume reduction of 80% by week 12 Date Initiated: 08/28/2021  Target Resolution Date: 11/26/2021 Goal Status: Active Ulcer/skin breakdown will heal within 14 weeks Date Initiated: 08/28/2021 Target Resolution Date: 12/26/2021 Goal Status: Active Interventions: Assess patient/caregiver ability to obtain necessary supplies Assess patient/caregiver ability to perform ulcer/skin care regimen upon admission and as needed Assess ulceration(s) every visit Notes: Electronic Signature(s) Signed: 09/05/2021 6:47:24 PM By: Carlene Coria RN Entered By: Carlene Coria on 09/04/2021 10:57:31 Roddey, Louie [name] ([id number]) -------------------------------------------------------------------------------- Pain Assessment Details Patient Name: [name] Date of Service: 09/04/2021 10:15 AM Medical Record Number: [id number] Patient Account Number: [id number] Date of Birth/Sex: 04/01/1956 (66 y.o. M) Treating RN: Carlene Coria Primary Care Tyisha Cressy: Waunita Schooner Other Clinician: Referring Adrian Specht: Waunita Schooner Treating  Pandora Mccrackin/Extender: Skipper Cliche in Treatment: 1 Active Problems Location of Pain Severity and Description of Pain Patient Has Paino No Site Locations Pain Management and Medication Current Pain Management: Electronic Signature(s) Signed: 09/05/2021 6:47:24 PM By: Carlene Coria RN Entered By: Carlene Coria on 09/04/2021 10:24:45 [name] ([id number]) -------------------------------------------------------------------------------- Patient/Caregiver Education Details Patient Name: [name] Date of Service: 09/04/2021 10:15 AM Medical Record Number: [id number] Patient Account Number: [id number] Date of Birth/Gender: 1956/01/19 (65 y.o. M) Treating RN: Carlene Coria Primary Care Physician: Waunita Schooner Other Clinician: Referring Physician: Waunita Schooner Treating Physician/Extender: Skipper Cliche in Treatment: 1 Education Assessment Education Provided To: Patient Education Topics Provided Wound/Skin Impairment: Methods: Explain/Verbal Responses: State content correctly Electronic Signature(s) Signed: 09/05/2021 6:47:24 PM By: Carlene Coria RN Entered By: Carlene Coria on 09/04/2021 11:10:50 Lenhard, Louie [name] ([id number]) -------------------------------------------------------------------------------- Wound Assessment Details Patient Name: [name] Date of Service: 09/04/2021 10:15 AM Medical Record Number: [id number] Patient Account Number: [id number] Date of Birth/Sex: 02/03/1956 (65 y.o. M) Treating RN: Carlene Coria Primary Care Erlene Devita: Waunita Schooner Other Clinician: Referring Zaydah Nawabi: Waunita Schooner Treating Emmitte Surgeon/Extender: Jeri Cos Weeks in Treatment: 1 Wound Status Wound Number: 1 Primary Etiology: Trauma, Other Wound Location: Left Chest Wound Status: Open Wounding Event: Trauma Comorbid History: Coronary Artery Disease, Hypertension Date Acquired: 07/18/2021 Weeks Of Treatment: 1 Clustered Wound: No Photos Wound Measurements Length: (cm)  3 Width: (cm) 1 Depth: (cm) 0.1 Area: (cm) 2.356 Volume: (cm) 0.236 % Reduction in Area: 34.1% % Reduction in Volume: 33.9% Epithelialization: None Tunneling: No Undermining: No Wound Description Classification: Full Thickness Without Exposed Support Structures Exudate Amount: Medium Exudate Type: Serosanguineous Exudate Color: red, brown Foul Odor After Cleansing: No Slough/Fibrino Yes Wound Bed Granulation Amount: Medium (34-66%) Exposed Structure Granulation Quality: Pink, Pale Fascia Exposed: No Necrotic Amount: Medium (34-66%) Fat Layer (Subcutaneous Tissue) Exposed: Yes Necrotic Quality: Adherent Slough Tendon Exposed: No Muscle Exposed: No Joint Exposed: No Bone Exposed: No Treatment Notes Wound #1 (Chest) Wound Laterality: Left Cleanser Soap and Water Discharge Instruction: Gently cleanse wound with antibacterial soap, rinse and pat dry prior to dressing wounds Peri-Wound Care Fout, Louie [name] ([id number]) Topical eucerin Discharge Instruction: apply to periwound Primary Dressing Xeroform 4x4-HBD (in/in) Discharge Instruction: Apply Xeroform 4x4-HBD (in/in) as directed Secondary Dressing Coverlet Latex-Free Fabric Adhesive Dressings Discharge Instruction: 1.5 x 2 Secured With Compression Wrap Compression Stockings Add-Ons Electronic Signature(s) Signed: 09/05/2021 6:47:24 PM By: Carlene Coria RN Entered By: Carlene Coria on 09/04/2021 10:30:22 [name] ([id number]) -------------------------------------------------------------------------------- Vitals Details Patient Name: [name] Date of Service: 09/04/2021 10:15 AM Medical Record Number: [id number] Patient Account Number: [id number] Date of Birth/Sex: 07/01/1956 (66 y.o. M) Treating RN: Carlene Coria Primary Care Recie Cirrincione: Waunita Schooner Other Clinician: Referring Daniell Paradise: Waunita Schooner Treating Genifer Lazenby/Extender: Skipper Cliche in Treatment: 1 Vital Signs Time Taken: 10:24 Temperature  (F): 97.9 Height (in): 71 Pulse (  bpm): 57 Weight (lbs): 312 Respiratory Rate (breaths/min): 18 Body Mass Index (BMI): 43.5 Blood Pressure (mmHg): 145/87 Reference Range: 80 - 120 mg / dl Electronic Signature(s) Signed: 09/05/2021 6:47:24 PM By: Carlene Coria RN Entered By: Carlene Coria on 09/04/2021 10:24:24

## 2021-09-11 ENCOUNTER — Other Ambulatory Visit: Payer: Self-pay

## 2021-09-11 DIAGNOSIS — L988 Other specified disorders of the skin and subcutaneous tissue: Secondary | ICD-10-CM | POA: Diagnosis not present

## 2021-09-11 DIAGNOSIS — I2510 Atherosclerotic heart disease of native coronary artery without angina pectoris: Secondary | ICD-10-CM | POA: Diagnosis not present

## 2021-09-11 DIAGNOSIS — L98492 Non-pressure chronic ulcer of skin of other sites with fat layer exposed: Secondary | ICD-10-CM | POA: Diagnosis not present

## 2021-09-11 DIAGNOSIS — I10 Essential (primary) hypertension: Secondary | ICD-10-CM | POA: Diagnosis not present

## 2021-09-11 NOTE — Progress Notes (Signed)
[name], HULBERT ([id number]) Visit Report for 09/11/2021 Arrival Information Details Patient Name: [name] Date of Service: 09/11/2021 10:15 AM Medical Record Number: [id number] Patient Account Number: [id number] Date of Birth/Sex: 22-May-1956 (66 y.o. M) Treating RN: Carlene Coria Primary Care Mayson Mcneish: Waunita Schooner Other Clinician: Referring Oyinkansola Truax: Waunita Schooner Treating Jasmynn Pfalzgraf/Extender: Skipper Cliche in Treatment: 2 Visit Information History Since Last Visit All ordered tests and consults were completed: No Patient Arrived: Ambulatory Added or deleted any medications: No Arrival Time: 10:22 Any new allergies or adverse reactions: No Accompanied By: wife Had a fall or experienced change in No Transfer Assistance: None activities of daily living that may affect Patient Identification Verified: Yes risk of falls: Secondary Verification Process Completed: Yes Signs or symptoms of abuse/neglect since last visito No Patient Requires Transmission-Based Precautions: No Hospitalized since last visit: No Patient Has Alerts: No Implantable device outside of the clinic excluding No cellular tissue based products placed in the center since last visit: Has Dressing in Place as Prescribed: Yes Pain Present Now: No Electronic Signature(s) Signed: 09/11/2021 3:42:59 PM By: Carlene Coria RN Entered By: Carlene Coria on 09/11/2021 10:35:14 [name] ([id number]) -------------------------------------------------------------------------------- Clinic Level of Care Assessment Details Patient Name: [name] Date of Service: 09/11/2021 10:15 AM Medical Record Number: [id number] Patient Account Number: [id number] Date of Birth/Sex: 10-Feb-1956 (66 y.o. M) Treating RN: Carlene Coria Primary Care Citlalic Norlander: Waunita Schooner Other Clinician: Referring Hawley Pavia: Waunita Schooner Treating Katharina Jehle/Extender: Skipper Cliche in Treatment: 2 Clinic Level of Care Assessment Items TOOL 4  Quantity Score X - Use when only an EandM is performed on FOLLOW-UP visit 1 0 ASSESSMENTS - Nursing Assessment / Reassessment X - Reassessment of Co-morbidities (includes updates in patient status) 1 10 X- 1 5 Reassessment of Adherence to Treatment Plan ASSESSMENTS - Wound and Skin Assessment / Reassessment X - Simple Wound Assessment / Reassessment - one wound 1 5 []  - 0 Complex Wound Assessment / Reassessment - multiple wounds []  - 0 Dermatologic / Skin Assessment (not related to wound area) ASSESSMENTS - Focused Assessment []  - Circumferential Edema Measurements - multi extremities 0 []  - 0 Nutritional Assessment / Counseling / Intervention []  - 0 Lower Extremity Assessment (monofilament, tuning fork, pulses) []  - 0 Peripheral Arterial Disease Assessment (using hand held doppler) ASSESSMENTS - Ostomy and/or Continence Assessment and Care []  - Incontinence Assessment and Management 0 []  - 0 Ostomy Care Assessment and Management (repouching, etc.) PROCESS - Coordination of Care X - Simple Patient / Family Education for ongoing care 1 15 []  - 0 Complex (extensive) Patient / Family Education for ongoing care []  - 0 Staff obtains Programmer, systems, Records, Test Results / Process Orders []  - 0 Staff telephones HHA, Nursing Homes / Clarify orders / etc []  - 0 Routine Transfer to another Facility (non-emergent condition) []  - 0 Routine Hospital Admission (non-emergent condition) []  - 0 New Admissions / Biomedical engineer / Ordering NPWT, Apligraf, etc. []  - 0 Emergency Hospital Admission (emergent condition) []  - 0 Simple Discharge Coordination []  - 0 Complex (extensive) Discharge Coordination PROCESS - Special Needs []  - Pediatric / Minor Patient Management 0 []  - 0 Isolation Patient Management []  - 0 Hearing / Language / Visual special needs []  - 0 Assessment of Community assistance (transportation, D/C planning, etc.) []  - 0 Additional assistance / Altered  mentation []  - 0 Support Surface(s) Assessment (bed, cushion, seat, etc.) INTERVENTIONS - Wound Cleansing / Measurement [name] ([id number]) X- 1 5 Simple Wound Cleansing - one wound []  - 0  Complex Wound Cleansing - multiple wounds X- 1 5 Wound Imaging (photographs - any number of wounds) []  - 0 Wound Tracing (instead of photographs) X- 1 5 Simple Wound Measurement - one wound []  - 0 Complex Wound Measurement - multiple wounds INTERVENTIONS - Wound Dressings X - Small Wound Dressing one or multiple wounds 1 10 []  - 0 Medium Wound Dressing one or multiple wounds []  - 0 Large Wound Dressing one or multiple wounds []  - 0 Application of Medications - topical []  - 0 Application of Medications - injection INTERVENTIONS - Miscellaneous []  - External ear exam 0 []  - 0 Specimen Collection (cultures, biopsies, blood, body fluids, etc.) []  - 0 Specimen(s) / Culture(s) sent or taken to Lab for analysis []  - 0 Patient Transfer (multiple staff / Civil Service fast streamer / Similar devices) []  - 0 Simple Staple / Suture removal (25 or less) []  - 0 Complex Staple / Suture removal (26 or more) []  - 0 Hypo / Hyperglycemic Management (close monitor of Blood Glucose) []  - 0 Ankle / Brachial Index (ABI) - do not check if billed separately X- 1 5 Vital Signs Has the patient been seen at the hospital within the last three years: Yes Total Score: 65 Level Of Care: New/Established - Level 2 Electronic Signature(s) Signed: 09/11/2021 3:42:59 PM By: Carlene Coria RN Entered By: Carlene Coria on 09/11/2021 10:28:17 [name] ([id number]) -------------------------------------------------------------------------------- Encounter Discharge Information Details Patient Name: [name] Date of Service: 09/11/2021 10:15 AM Medical Record Number: [id number] Patient Account Number: [id number] Date of Birth/Sex: [birth year] (65 y.o. M) Treating RN: Carlene Coria Primary Care Yarenis Cerino: Waunita Schooner  Other Clinician: Referring Hykeem Ojeda: Waunita Schooner Treating Verbena Boeding/Extender: Skipper Cliche in Treatment: 2 Encounter Discharge Information Items Discharge Condition: Stable Ambulatory Status: Ambulatory Discharge Destination: Home Transportation: Private Auto Accompanied By: wife Schedule Follow-up Appointment: Yes Clinical Summary of Care: Patient Declined Electronic Signature(s) Signed: 09/11/2021 3:42:59 PM By: Carlene Coria RN Entered By: Carlene Coria on 09/11/2021 10:27:36 Dickerson, Louie Casa ([id number]) -------------------------------------------------------------------------------- Wound Assessment Details Patient Name: [name] Date of Service: 09/11/2021 10:15 AM Medical Record Number: [id number] Patient Account Number: [id number] Date of Birth/Sex: 14-Jun-1956 (65 y.o. M) Treating RN: Carlene Coria Primary Care Shalissa Easterwood: Waunita Schooner Other Clinician: Referring Maaz Spiering: Waunita Schooner Treating Truda Staub/Extender: Jeri Cos Weeks in Treatment: 2 Wound Status Wound Number: 1 Primary Etiology: Trauma, Other Wound Location: Left Chest Wound Status: Open Wounding Event: Trauma Comorbid History: Coronary Artery Disease, Hypertension Date Acquired: 07/18/2021 Weeks Of Treatment: 2 Clustered Wound: No Wound Measurements Length: (cm) 3 Width: (cm) 1 Depth: (cm) 0.1 Area: (cm) 2.356 Volume: (cm) 0.236 % Reduction in Area: 34.1% % Reduction in Volume: 33.9% Epithelialization: None Tunneling: No Undermining: No Wound Description Classification: Full Thickness Without Exposed Support Structu Exudate Amount: Medium Exudate Type: Serosanguineous Exudate Color: red, brown res Foul Odor After Cleansing: No Slough/Fibrino Yes Wound Bed Granulation Amount: Medium (34-66%) Exposed Structure Granulation Quality: Pink, Pale Fascia Exposed: No Necrotic Amount: Medium (34-66%) Fat Layer (Subcutaneous Tissue) Exposed: Yes Necrotic Quality: Adherent Slough Tendon  Exposed: No Muscle Exposed: No Joint Exposed: No Bone Exposed: No Treatment Notes Wound #1 (Chest) Wound Laterality: Left Cleanser Soap and Water Discharge Instruction: Gently cleanse wound with antibacterial soap, rinse and pat dry prior to dressing wounds Peri-Wound Care Topical eucerin Discharge Instruction: apply to periwound Primary Dressing Xeroform 4x4-HBD (in/in) Discharge Instruction: Apply Xeroform 4x4-HBD (in/in) as directed Secondary Dressing Coverlet Latex-Free Fabric Adhesive Dressings Discharge Instruction: 1.5 x 2 Secured With Compression Wrap Okazaki, Olof ([id number]) Compression  Stockings Environmental education officer) Signed: 09/11/2021 3:42:59 PM By: Carlene Coria RN Entered By: Carlene Coria on 09/11/2021 10:24:16

## 2021-09-12 NOTE — Progress Notes (Signed)
[name], [name] ([id number]) Visit Report for 09/11/2021 Physician Orders Details Patient Name: [name], [name] Date of Service: 09/11/2021 10:15 AM Medical Record Number: [id number] Patient Account Number: [id number] Date of Birth/Sex: 05/28/1956 (66 y.o. M) Treating RN: Carlene Coria Primary Care Provider: Waunita Schooner Other Clinician: Referring Provider: Waunita Schooner Treating Provider/Extender: Skipper Cliche in Treatment: 2 Verbal / Phone Orders: No Diagnosis Coding Follow-up Appointments o Return Appointment in 1 week. Wound Treatment Wound #1 - Chest Wound Laterality: Left Cleanser: Soap and Water 1 x Per Day/30 Days Discharge Instructions: Gently cleanse wound with antibacterial soap, rinse and pat dry prior to dressing wounds Topical: eucerin 1 x Per Day/30 Days Discharge Instructions: apply to periwound Primary Dressing: Xeroform 4x4-HBD (in/in) 1 x Per Day/30 Days Discharge Instructions: Apply Xeroform 4x4-HBD (in/in) as directed Secondary Dressing: Coverlet Latex-Free Fabric Adhesive Dressings 1 x Per Day/30 Days Discharge Instructions: 1.5 x 2 Electronic Signature(s) Signed: 09/11/2021 3:42:59 PM By: Carlene Coria RN Signed: 09/12/2021 4:48:19 PM By: Worthy Keeler PA-C Entered By: Carlene Coria on 09/11/2021 10:26:34 [name] ([id number]) -------------------------------------------------------------------------------- SuperBill Details Patient Name: [name] Date of Service: 09/11/2021 Medical Record Number: [id number] Patient Account Number: [id number] Date of Birth/Sex: Jan 02, 1956 (65 y.o. M) Treating RN: Carlene Coria Primary Care Provider: Waunita Schooner Other Clinician: Referring Provider: Waunita Schooner Treating Provider/Extender: Skipper Cliche in Treatment: 2 Diagnosis Coding ICD-10 Codes Code Description L98.8 Other specified disorders of the skin and subcutaneous tissue L98.492 Non-pressure chronic ulcer of skin of other sites with fat layer  exposed I10 Essential (primary) hypertension I25.10 Atherosclerotic heart disease of native coronary artery without angina pectoris Facility Procedures CPT4 Code: [id number] Description: 07867 - WOUND CARE VISIT-LEV 2 EST PT Modifier: Quantity: 1 Electronic Signature(s) Signed: 09/11/2021 3:42:59 PM By: Carlene Coria RN Signed: 09/12/2021 4:48:19 PM By: Worthy Keeler PA-C Entered By: Carlene Coria on 09/11/2021 10:28:23

## 2021-09-17 ENCOUNTER — Other Ambulatory Visit: Payer: Self-pay | Admitting: Cardiology

## 2021-09-18 ENCOUNTER — Other Ambulatory Visit: Payer: Self-pay

## 2021-09-18 ENCOUNTER — Encounter: Payer: Medicare HMO | Admitting: Physician Assistant

## 2021-09-18 DIAGNOSIS — L988 Other specified disorders of the skin and subcutaneous tissue: Secondary | ICD-10-CM | POA: Diagnosis not present

## 2021-09-18 DIAGNOSIS — I10 Essential (primary) hypertension: Secondary | ICD-10-CM | POA: Diagnosis not present

## 2021-09-18 DIAGNOSIS — L98492 Non-pressure chronic ulcer of skin of other sites with fat layer exposed: Secondary | ICD-10-CM | POA: Diagnosis not present

## 2021-09-18 DIAGNOSIS — I2510 Atherosclerotic heart disease of native coronary artery without angina pectoris: Secondary | ICD-10-CM | POA: Diagnosis not present
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
## 2021-09-18 NOTE — Progress Notes (Signed)
[name] ([id number]) Visit Report for 09/18/2021 Arrival Information Details Patient Name: [name], [name] Date of Service: 09/18/2021 10:15 AM Medical Record Number: [id number] Patient Account Number: [id number] Date of Birth/Sex: 04-May-1956 (66 y.o. M) Treating RN: Carlene Coria Primary Care Rayyan Orsborn: Waunita Schooner Other Clinician: Referring Jaelyn Cloninger: Waunita Schooner Treating Chakira Jachim/Extender: Skipper Cliche in Treatment: 3 Visit Information History Since Last Visit All ordered tests and consults were completed: No Patient Arrived: Ambulatory Added or deleted any medications: No Arrival Time: 10:14 Any new allergies or adverse reactions: No Accompanied By: wife Had a fall or experienced change in No Transfer Assistance: None activities of daily living that may affect Patient Identification Verified: Yes risk of falls: Secondary Verification Process Completed: Yes Signs or symptoms of abuse/neglect since last visito No Patient Requires Transmission-Based Precautions: No Hospitalized since last visit: No Patient Has Alerts: No Implantable device outside of the clinic excluding No cellular tissue based products placed in the center since last visit: Has Dressing in Place as Prescribed: Yes Pain Present Now: No Electronic Signature(s) Signed: 09/18/2021 1:59:18 PM By: Carlene Coria RN Entered By: Carlene Coria on 09/18/2021 10:18:48 Elsey, Louie Casa ([id number]) -------------------------------------------------------------------------------- Clinic Level of Care Assessment Details Patient Name: [name] Date of Service: 09/18/2021 10:15 AM Medical Record Number: [id number] Patient Account Number: [id number] Date of Birth/Sex: [birth year] (66 y.o. M) Treating RN: Carlene Coria Primary Care Christi Wirick: Waunita Schooner Other Clinician: Referring Lafawn Lenoir: Waunita Schooner Treating Algenis Ballin/Extender: Skipper Cliche in Treatment: 3 Clinic Level of Care Assessment Items TOOL 4  Quantity Score X - Use when only an EandM is performed on FOLLOW-UP visit 1 0 ASSESSMENTS - Nursing Assessment / Reassessment X - Reassessment of Co-morbidities (includes updates in patient status) 1 10 X- 1 5 Reassessment of Adherence to Treatment Plan ASSESSMENTS - Wound and Skin Assessment / Reassessment X - Simple Wound Assessment / Reassessment - one wound 1 5 []  - 0 Complex Wound Assessment / Reassessment - multiple wounds []  - 0 Dermatologic / Skin Assessment (not related to wound area) ASSESSMENTS - Focused Assessment []  - Circumferential Edema Measurements - multi extremities 0 []  - 0 Nutritional Assessment / Counseling / Intervention []  - 0 Lower Extremity Assessment (monofilament, tuning fork, pulses) []  - 0 Peripheral Arterial Disease Assessment (using hand held doppler) ASSESSMENTS - Ostomy and/or Continence Assessment and Care []  - Incontinence Assessment and Management 0 []  - 0 Ostomy Care Assessment and Management (repouching, etc.) PROCESS - Coordination of Care X - Simple Patient / Family Education for ongoing care 1 15 []  - 0 Complex (extensive) Patient / Family Education for ongoing care []  - 0 Staff obtains Programmer, systems, Records, Test Results / Process Orders []  - 0 Staff telephones HHA, Nursing Homes / Clarify orders / etc []  - 0 Routine Transfer to another Facility (non-emergent condition) []  - 0 Routine Hospital Admission (non-emergent condition) []  - 0 New Admissions / Biomedical engineer / Ordering NPWT, Apligraf, etc. []  - 0 Emergency Hospital Admission (emergent condition) X- 1 10 Simple Discharge Coordination []  - 0 Complex (extensive) Discharge Coordination PROCESS - Special Needs []  - Pediatric / Minor Patient Management 0 []  - 0 Isolation Patient Management []  - 0 Hearing / Language / Visual special needs []  - 0 Assessment of Community assistance (transportation, D/C planning, etc.) []  - 0 Additional assistance / Altered  mentation []  - 0 Support Surface(s) Assessment (bed, cushion, seat, etc.) INTERVENTIONS - Wound Cleansing / Measurement Hipolito, Kennith ([id number]) X- 1 5 Simple Wound Cleansing - one wound []  - 0  Complex Wound Cleansing - multiple wounds X- 1 5 Wound Imaging (photographs - any number of wounds) []  - 0 Wound Tracing (instead of photographs) X- 1 5 Simple Wound Measurement - one wound []  - 0 Complex Wound Measurement - multiple wounds INTERVENTIONS - Wound Dressings []  - Small Wound Dressing one or multiple wounds 0 X- 1 15 Medium Wound Dressing one or multiple wounds []  - 0 Large Wound Dressing one or multiple wounds X- 1 5 Application of Medications - topical []  - 0 Application of Medications - injection INTERVENTIONS - Miscellaneous []  - External ear exam 0 []  - 0 Specimen Collection (cultures, biopsies, blood, body fluids, etc.) []  - 0 Specimen(s) / Culture(s) sent or taken to Lab for analysis []  - 0 Patient Transfer (multiple staff / Civil Service fast streamer / Similar devices) []  - 0 Simple Staple / Suture removal (25 or less) []  - 0 Complex Staple / Suture removal (26 or more) []  - 0 Hypo / Hyperglycemic Management (close monitor of Blood Glucose) []  - 0 Ankle / Brachial Index (ABI) - do not check if billed separately X- 1 5 Vital Signs Has the patient been seen at the hospital within the last three years: Yes Total Score: 85 Level Of Care: New/Established - Level 3 Electronic Signature(s) Signed: 09/18/2021 1:59:18 PM By: Carlene Coria RN Entered By: Carlene Coria on 09/18/2021 11:02:08 [name] ([id number]) -------------------------------------------------------------------------------- Encounter Discharge Information Details Patient Name: [name] Date of Service: 09/18/2021 10:15 AM Medical Record Number: [id number] Patient Account Number: [id number] Date of Birth/Sex: August 29, 1955 (65 y.o. M) Treating RN: Carlene Coria Primary Care Weda Baumgarner: Waunita Schooner  Other Clinician: Referring Liban Guedes: Waunita Schooner Treating Kaelem Brach/Extender: Skipper Cliche in Treatment: 3 Encounter Discharge Information Items Discharge Condition: Stable Ambulatory Status: Ambulatory Discharge Destination: Home Transportation: Private Auto Accompanied By: wife Schedule Follow-up Appointment: Yes Clinical Summary of Care: Patient Declined Electronic Signature(s) Signed: 09/18/2021 1:59:18 PM By: Carlene Coria RN Entered By: Carlene Coria on 09/18/2021 11:03:38 [name] ([id number]) -------------------------------------------------------------------------------- Lower Extremity Assessment Details Patient Name: [name] Date of Service: 09/18/2021 10:15 AM Medical Record Number: [id number] Patient Account Number: [id number] Date of Birth/Sex: [birth year] (65 y.o. M) Treating RN: Carlene Coria Primary Care Tonnie Friedel: Waunita Schooner Other Clinician: Referring Muna Demers: Waunita Schooner Treating Zyasia Halbleib/Extender: Jeri Cos Weeks in Treatment: 3 Electronic Signature(s) Signed: 09/18/2021 1:59:18 PM By: Carlene Coria RN Entered By: Carlene Coria on 09/18/2021 10:23:45 [name] ([id number]) -------------------------------------------------------------------------------- Multi Wound Chart Details Patient Name: [name] Date of Service: 09/18/2021 10:15 AM Medical Record Number: [id number] Patient Account Number: [id number] Date of Birth/Sex: 1956/03/25 (65 y.o. M) Treating RN: Carlene Coria Primary Care Kenric Ginger: Waunita Schooner Other Clinician: Referring Deral Schellenberg: Waunita Schooner Treating Massai Hankerson/Extender: Skipper Cliche in Treatment: 3 Vital Signs Height(in): 71 Pulse(bpm): 56 Weight(lbs): 312 Blood Pressure(mmHg): 128/86 Body Mass Index(BMI): 43.5 Temperature(F): 97.8 Respiratory Rate(breaths/min): 18 Photos: [N/A:N/A] Wound Location: Left Chest N/A N/A Wounding Event: Trauma N/A N/A Primary Etiology: Trauma, Other N/A N/A Comorbid  History: Coronary Artery Disease, N/A N/A Hypertension Date Acquired: 07/18/2021 N/A N/A Weeks of Treatment: 3 N/A N/A Wound Status: Open N/A N/A Wound Recurrence: No N/A N/A Measurements L x W x D (cm) 13x3.5x0.1 N/A N/A Area (cm) : 35.736 N/A N/A Volume (cm) : 3.574 N/A N/A % Reduction in Area: -899.90% N/A N/A % Reduction in Volume: -901.10% N/A N/A Classification: Full Thickness Without Exposed N/A N/A Support Structures Exudate Amount: Medium N/A N/A Exudate Type: Serosanguineous N/A N/A Exudate Color: red, brown N/A N/A Granulation Amount: Medium (34-66%)  N/A N/A Granulation Quality: Pink, Pale N/A N/A Necrotic Amount: Medium (34-66%) N/A N/A Exposed Structures: Fat Layer (Subcutaneous Tissue): N/A N/A Yes Fascia: No Tendon: No Muscle: No Joint: No Bone: No Epithelialization: Large (67-100%) N/A N/A Treatment Notes Electronic Signature(s) Signed: 09/18/2021 1:59:18 PM By: Carlene Coria RN Entered By: Carlene Coria on 09/18/2021 10:50:27 Nichelson, Louie Casa ([id number]) -------------------------------------------------------------------------------- [place] Details Patient Name: [name] Date of Service: 09/18/2021 10:15 AM Medical Record Number: [id number] Patient Account Number: [id number] Date of Birth/Sex: 1956-08-12 (65 y.o. M) Treating RN: Carlene Coria Primary Care Kristel Durkee: Waunita Schooner Other Clinician: Referring Lennis Rader: Waunita Schooner Treating Khristy Kalan/Extender: Skipper Cliche in Treatment: 3 Active Inactive Wound/Skin Impairment Nursing Diagnoses: Knowledge deficit related to ulceration/compromised skin integrity Goals: Patient/caregiver will verbalize understanding of skin care regimen Date Initiated: 08/28/2021 Target Resolution Date: 09/28/2021 Goal Status: Active Ulcer/skin breakdown will have a volume reduction of 30% by week 4 Date Initiated: 08/28/2021 Target Resolution Date: 09/28/2021 Goal Status: Active Ulcer/skin  breakdown will have a volume reduction of 50% by week 8 Date Initiated: 08/28/2021 Target Resolution Date: 10/26/2021 Goal Status: Active Ulcer/skin breakdown will have a volume reduction of 80% by week 12 Date Initiated: 08/28/2021 Target Resolution Date: 11/26/2021 Goal Status: Active Ulcer/skin breakdown will heal within 14 weeks Date Initiated: 08/28/2021 Target Resolution Date: 12/26/2021 Goal Status: Active Interventions: Assess patient/caregiver ability to obtain necessary supplies Assess patient/caregiver ability to perform ulcer/skin care regimen upon admission and as needed Assess ulceration(s) every visit Notes: Electronic Signature(s) Signed: 09/18/2021 1:59:18 PM By: Carlene Coria RN Entered By: Carlene Coria on 09/18/2021 10:50:16 Lothamer, Louie Casa ([id number]) -------------------------------------------------------------------------------- Pain Assessment Details Patient Name: [name] Date of Service: 09/18/2021 10:15 AM Medical Record Number: [id number] Patient Account Number: [id number] Date of Birth/Sex: 09-18-1955 (65 y.o. M) Treating RN: Carlene Coria Primary Care Shaan Rhoads: Waunita Schooner Other Clinician: Referring Tyleigh Mahn: Waunita Schooner Treating Nikya Busler/Extender: Skipper Cliche in Treatment: 3 Active Problems Location of Pain Severity and Description of Pain Patient Has Paino No Site Locations Pain Management and Medication Current Pain Management: Electronic Signature(s) Signed: 09/18/2021 1:59:18 PM By: Carlene Coria RN Entered By: Carlene Coria on 09/18/2021 10:19:21 [name] ([id number]) -------------------------------------------------------------------------------- Patient/Caregiver Education Details Patient Name: [name] Date of Service: 09/18/2021 10:15 AM Medical Record Number: [id number] Patient Account Number: [id number] Date of Birth/Gender: [birth year] (65 y.o. M) Treating RN: Carlene Coria Primary Care Physician: Waunita Schooner Other  Clinician: Referring Physician: Waunita Schooner Treating Physician/Extender: Skipper Cliche in Treatment: 3 Education Assessment Education Provided To: Patient Education Topics Provided Wound/Skin Impairment: Methods: Explain/Verbal Responses: State content correctly Electronic Signature(s) Signed: 09/18/2021 1:59:18 PM By: Carlene Coria RN Entered By: Carlene Coria on 09/18/2021 11:03:00 Chavira, Louie Casa ([id number]) -------------------------------------------------------------------------------- Wound Assessment Details Patient Name: [name] Date of Service: 09/18/2021 10:15 AM Medical Record Number: [id number] Patient Account Number: [id number] Date of Birth/Sex: 04/28/1956 (65 y.o. M) Treating RN: Carlene Coria Primary Care Baxter Gonzalez: Waunita Schooner Other Clinician: Referring Taden Witter: Waunita Schooner Treating Catelyn Friel/Extender: Jeri Cos Weeks in Treatment: 3 Wound Status Wound Number: 1 Primary Etiology: Trauma, Other Wound Location: Left Chest Wound Status: Open Wounding Event: Trauma Comorbid History: Coronary Artery Disease, Hypertension Date Acquired: 07/18/2021 Weeks Of Treatment: 3 Clustered Wound: No Photos Wound Measurements Length: (cm) 13 Width: (cm) 3.5 Depth: (cm) 0.1 Area: (cm) 35.736 Volume: (cm) 3.574 % Reduction in Area: -899.9% % Reduction in Volume: -901.1% Epithelialization: Large (67-100%) Tunneling: No Undermining: No Wound Description Classification: Full Thickness Without Exposed Support Structures Exudate Amount: Medium Exudate Type: Serosanguineous Exudate Color: red,  brown Foul Odor After Cleansing: No Slough/Fibrino Yes Wound Bed Granulation Amount: Medium (34-66%) Exposed Structure Granulation Quality: Pink, Pale Fascia Exposed: No Necrotic Amount: Medium (34-66%) Fat Layer (Subcutaneous Tissue) Exposed: Yes Necrotic Quality: Adherent Slough Tendon Exposed: No Muscle Exposed: No Joint Exposed: No Bone Exposed:  No Treatment Notes Wound #1 (Chest) Wound Laterality: Left Cleanser Soap and Water Discharge Instruction: Gently cleanse wound with antibacterial soap, rinse and pat dry prior to dressing wounds Peri-Wound Care Galka, Louie Casa ([id number]) Topical eucerin Discharge Instruction: apply to periwound Primary Dressing Xeroform 4x4-HBD (in/in) Discharge Instruction: Apply Xeroform 4x4-HBD (in/in) as directed Secondary Dressing Coverlet Latex-Free Fabric Adhesive Dressings Discharge Instruction: 1.5 x 2 Secured With Compression Wrap Compression Stockings Add-Ons Electronic Signature(s) Signed: 09/18/2021 1:59:18 PM By: Carlene Coria RN Entered By: Carlene Coria on 09/18/2021 10:23:20 [name] ([id number]) -------------------------------------------------------------------------------- Vitals Details Patient Name: [name] Date of Service: 09/18/2021 10:15 AM Medical Record Number: [id number] Patient Account Number: [id number] Date of Birth/Sex: Sep 28, 1955 (66 y.o. M) Treating RN: Carlene Coria Primary Care Loraina Stauffer: Waunita Schooner Other Clinician: Referring Kayana Thoen: Waunita Schooner Treating Keylen Eckenrode/Extender: Skipper Cliche in Treatment: 3 Vital Signs Time Taken: 10:18 Temperature (F): 97.8 Height (in): 71 Pulse (bpm): 56 Weight (lbs): 312 Respiratory Rate (breaths/min): 18 Body Mass Index (BMI): 43.5 Blood Pressure (mmHg): 128/86 Reference Range: 80 - 120 mg / dl Electronic Signature(s) Signed: 09/18/2021 1:59:18 PM By: Carlene Coria RN Entered By: Carlene Coria on 09/18/2021 10:19:07

## 2021-09-20 ENCOUNTER — Other Ambulatory Visit: Payer: Self-pay | Admitting: Cardiology

## 2021-09-24 IMAGING — CT CT ABD-PEL WO/W CM
3 of 12 series · 11 of 46 positions shown, 17 images · IV contrast (omnipaque)
Comparison: None.

CLINICAL DATA: Gross hematuria with incomplete bladder emptying
since [REDACTED]. Urinary frequency. History kidney stones and BPH.

EXAM:
CT ABDOMEN AND PELVIS WITHOUT AND WITH CONTRAST
TECHNIQUE: Multidetector CT imaging of the abdomen and pelvis was performed
following the standard protocol before and following the bolus
administration of intravenous contrast.
CONTRAST:  125mL OMNIPAQUE IOHEXOL 350 MG/ML SOLN

[Series 2: abd without pre 5.00 · axial · non-contrast · 0.85mm/px · z∈[-1524,-1374]mm · 3 of 106 slices shown]
[im 16/106  soft-tissue]
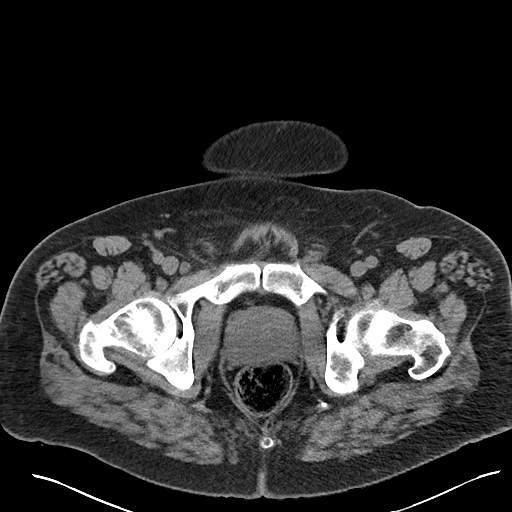
[im 31/106  soft-tissue]
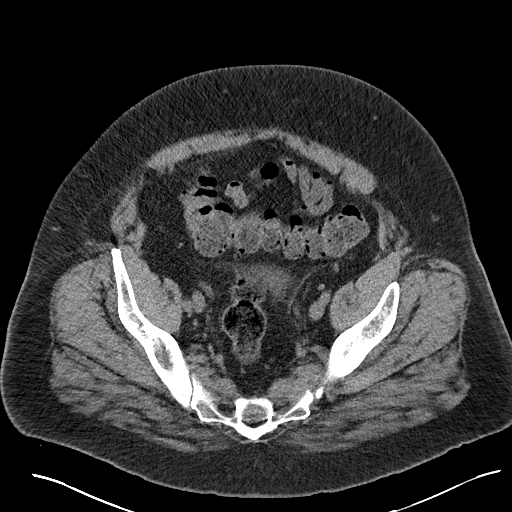
[im 46/106  soft-tissue]
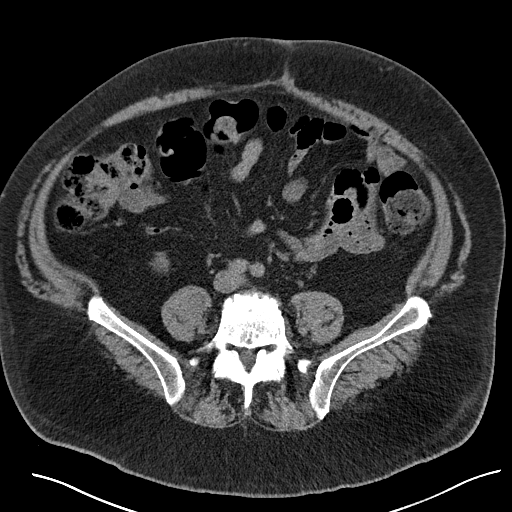

[Series 5: cor without without pre 2.00 cor · coronal · non-contrast · 0.85mm/px · 2 of 206 slices shown, 3 images]
[im 69/206  soft-tissue]
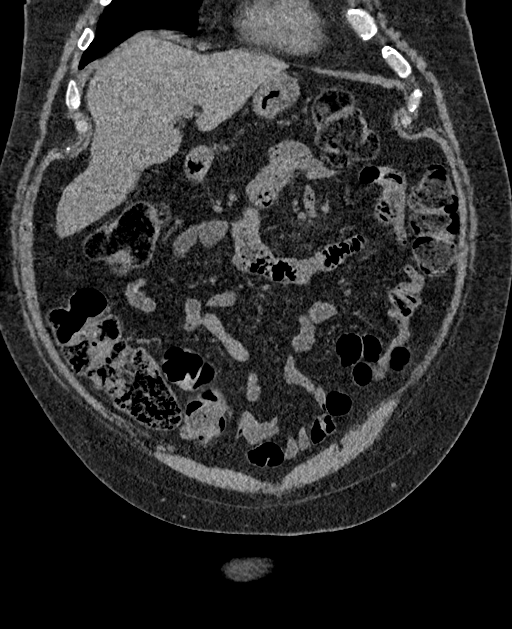
[im 69/206  bone]
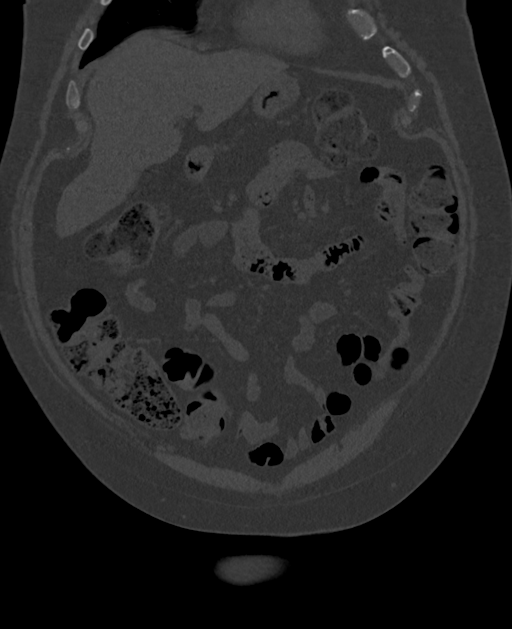
[im 137/206  soft-tissue]
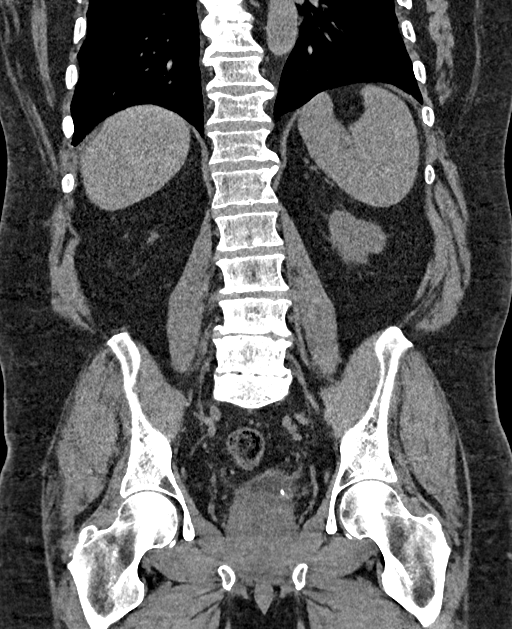

[Series 17: axial delay delay prone 5.00 · axial · delayed · 0.92mm/px · z∈[-1497,-1097]mm · 6 of 114 slices shown, 11 images]
[im 17/114  soft-tissue]
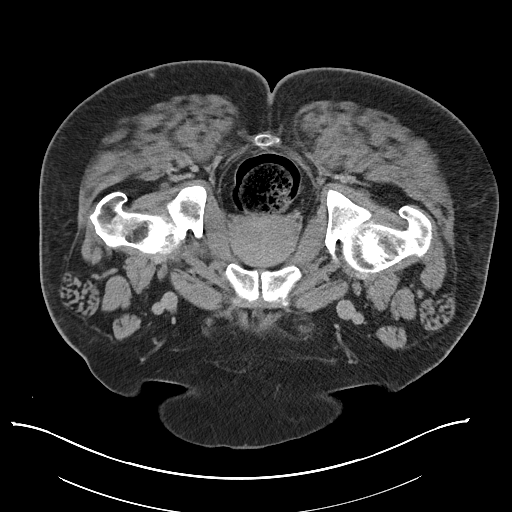
[im 17/114  bone]
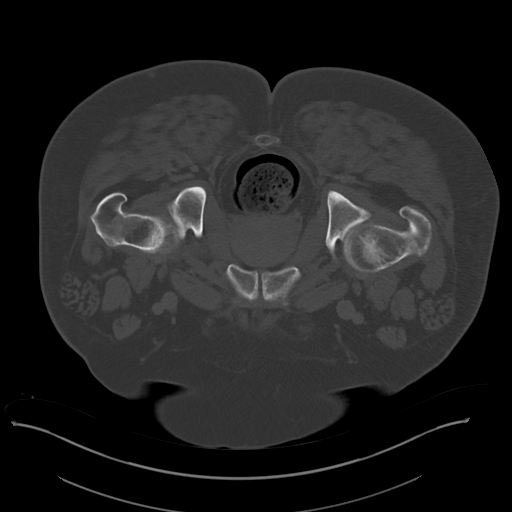
[im 33/114  soft-tissue]
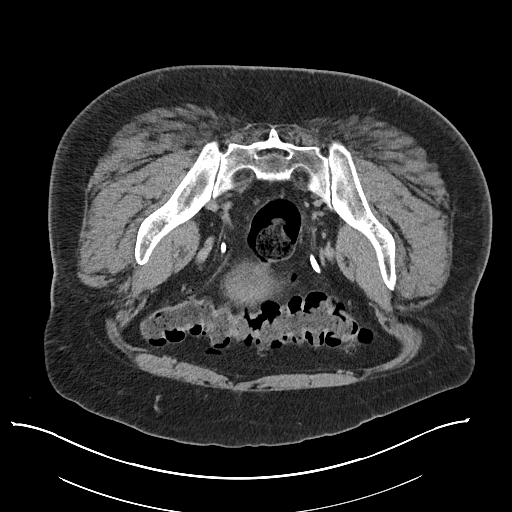
[im 49/114  soft-tissue]
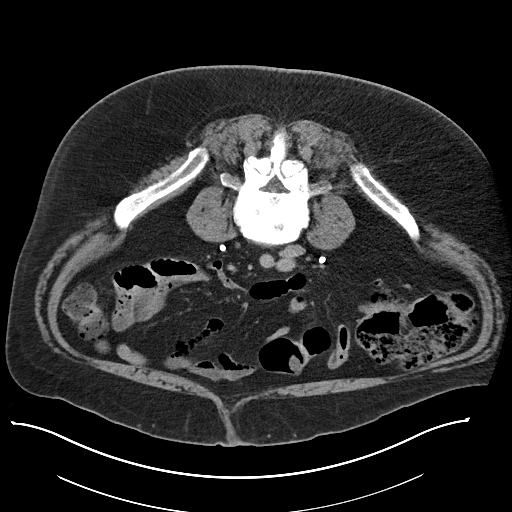
[im 49/114  lung]
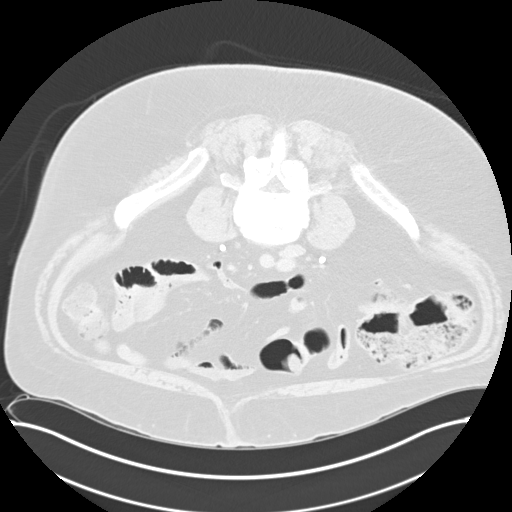
[im 65/114  soft-tissue]
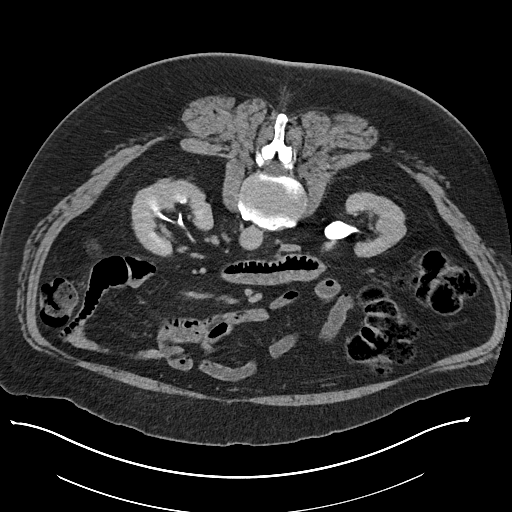
[im 65/114  lung]
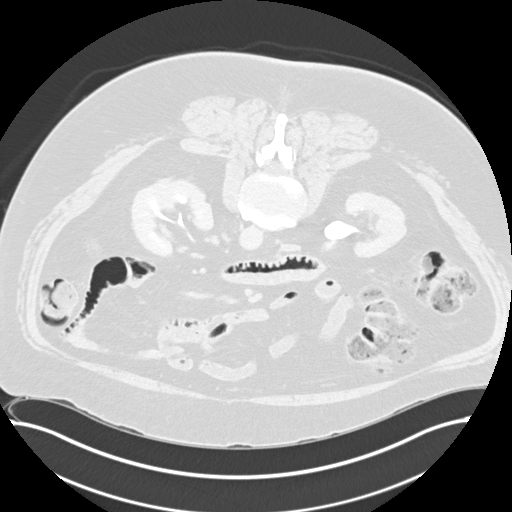
[im 81/114  soft-tissue]
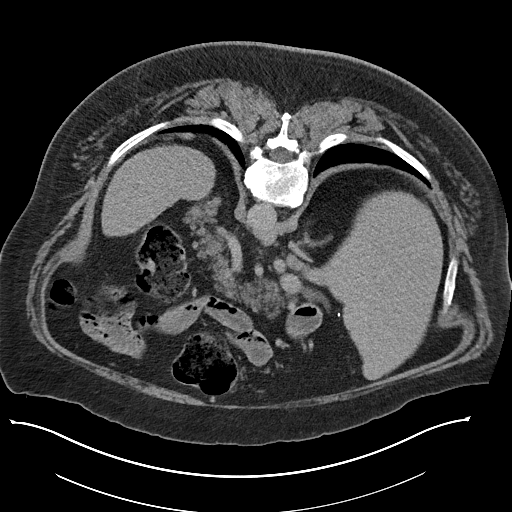
[im 81/114  lung]
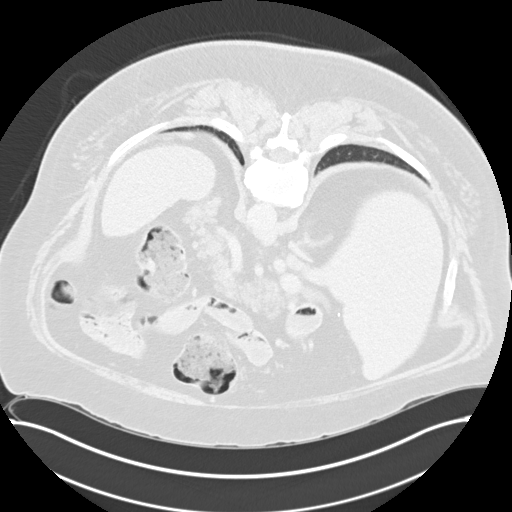
[im 97/114  soft-tissue]
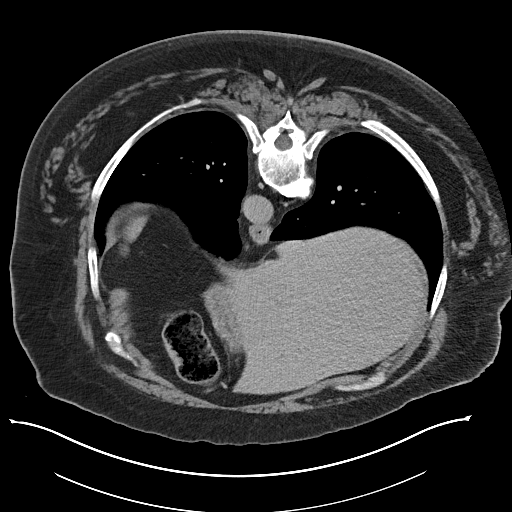
[im 97/114  lung]
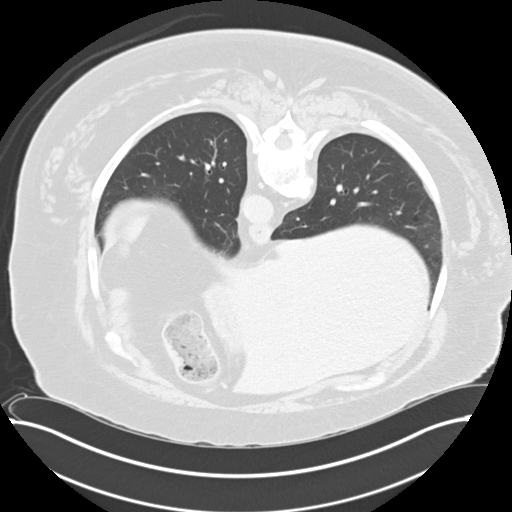

[11 of 46 positions shown; findings below may reference images not displayed]

FINDINGS: Lower chest: 3 mm subpleural right lower lobe pulmonary nodule on
16/19. Mild cardiomegaly, without pericardial or pleural effusion.
Right coronary artery atherosclerosis.

Hepatobiliary: Nonspecific caudate lobe enlargement. No focal liver
lesion. Cholecystectomy, without biliary ductal dilatation.

Pancreas: Normal, without mass or ductal dilatation.

Spleen: Normal in size, without focal abnormality.

Adrenals/Urinary Tract: Normal adrenal glands. 3 mm lower pole left
renal collecting system calculus. No right-sided renal calculi. No
hydroureter or ureteric calculi. 2 left-sided bladder stones
dependently, including on 64/2. Maximally 3 mm.

2.7 cm left renal lesion is most consistent with a minimally complex
cyst, including on 42/9. Too small to characterize interpolar right
renal lesion. No suspicious renal mass. Moderate to good renal
collecting system opacification on delayed images. Good ureteric
opacification, without filling defect.

There is a moderate amount of non-opacified urine within the
nondependent bladder on delayed images. Given this factor, no
enhancing bladder mass or bladder filling defect. Mild median lobe
prostatic impression into the urinary bladder.

Stomach/Bowel: Normal stomach, without wall thickening. Colonic
stool burden suggests constipation. Normal terminal ileum and
appendix. Normal small bowel.

Vascular/Lymphatic: Aortic atherosclerosis. No abdominopelvic
adenopathy.

Reproductive: Mild prostatomegaly.

Other: No significant free fluid.

Musculoskeletal: Lumbosacral spondylosis.
IMPRESSION: 1. Left nephrolithiasis and bladder stones. No other explanation for
hematuria.
2. Prostatomegaly.
3. 3 mm right lower lobe pulmonary nodule. No follow-up needed if
patient is low-risk. Non-contrast chest CT can be considered in 12
months if patient is high-risk. This recommendation follows the
consensus statement: Guidelines for Management of Incidental
Pulmonary Nodules Detected on CT Images: From the [HOSPITAL]
4. Possible constipation.
5. Aortic Atherosclerosis (G3926-ZM9.9).

## 2021-09-25 ENCOUNTER — Encounter: Payer: Medicare HMO | Admitting: Physician Assistant

## 2021-09-25 ENCOUNTER — Other Ambulatory Visit: Payer: Self-pay

## 2021-09-25 DIAGNOSIS — I10 Essential (primary) hypertension: Secondary | ICD-10-CM | POA: Diagnosis not present

## 2021-09-25 DIAGNOSIS — L988 Other specified disorders of the skin and subcutaneous tissue: Secondary | ICD-10-CM | POA: Diagnosis not present

## 2021-09-25 DIAGNOSIS — I2510 Atherosclerotic heart disease of native coronary artery without angina pectoris: Secondary | ICD-10-CM | POA: Diagnosis not present

## 2021-09-25 DIAGNOSIS — L98492 Non-pressure chronic ulcer of skin of other sites with fat layer exposed: Secondary | ICD-10-CM | POA: Diagnosis not present
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
## 2021-09-26 NOTE — Progress Notes (Signed)
DRUE, HARR ([id number]) Visit Report for 09/25/2021 Arrival Information Details Patient Name: [name], [name] Date of Service: 09/25/2021 10:15 AM Medical Record Number: [id number] Patient Account Number: [id number] Date of Birth/Sex: 11-03-1955 (66 y.o. M) Treating RN: Carlene Coria Primary Care Benjimin Hadden: Waunita Schooner Other Clinician: Referring Lakie Mclouth: Waunita Schooner Treating Emonie Espericueta/Extender: Skipper Cliche in Treatment: 4 Visit Information History Since Last Visit All ordered tests and consults were completed: No Patient Arrived: Ambulatory Added or deleted any medications: No Arrival Time: 10:17 Any new allergies or adverse reactions: No Accompanied By: wife Had a fall or experienced change in No Transfer Assistance: None activities of daily living that may affect Patient Identification Verified: Yes risk of falls: Secondary Verification Process Completed: Yes Signs or symptoms of abuse/neglect since last visito No Patient Requires Transmission-Based Precautions: No Hospitalized since last visit: No Patient Has Alerts: No Implantable device outside of the clinic excluding No cellular tissue based products placed in the center since last visit: Has Dressing in Place as Prescribed: Yes Pain Present Now: No Electronic Signature(s) Signed: 09/26/2021 1:16:38 PM By: Carlene Coria RN Entered By: Carlene Coria on 09/25/2021 10:19:51 Grulke, Louie Casa ([id number]) -------------------------------------------------------------------------------- Clinic Level of Care Assessment Details Patient Name: [name] Date of Service: 09/25/2021 10:15 AM Medical Record Number: [id number] Patient Account Number: [id number] Date of Birth/Sex: 1956/07/12 (66 y.o. M) Treating RN: Carlene Coria Primary Care Toddy Boyd: Waunita Schooner Other Clinician: Referring Azarie Coriz: Waunita Schooner Treating Miabella Shannahan/Extender: Skipper Cliche in Treatment: 4 Clinic Level of Care Assessment Items TOOL 4  Quantity Score X - Use when only an EandM is performed on FOLLOW-UP visit 1 0 ASSESSMENTS - Nursing Assessment / Reassessment X - Reassessment of Co-morbidities (includes updates in patient status) 1 10 X- 1 5 Reassessment of Adherence to Treatment Plan ASSESSMENTS - Wound and Skin Assessment / Reassessment X - Simple Wound Assessment / Reassessment - one wound 1 5 []  - 0 Complex Wound Assessment / Reassessment - multiple wounds []  - 0 Dermatologic / Skin Assessment (not related to wound area) ASSESSMENTS - Focused Assessment []  - Circumferential Edema Measurements - multi extremities 0 []  - 0 Nutritional Assessment / Counseling / Intervention []  - 0 Lower Extremity Assessment (monofilament, tuning fork, pulses) []  - 0 Peripheral Arterial Disease Assessment (using hand held doppler) ASSESSMENTS - Ostomy and/or Continence Assessment and Care []  - Incontinence Assessment and Management 0 []  - 0 Ostomy Care Assessment and Management (repouching, etc.) PROCESS - Coordination of Care X - Simple Patient / Family Education for ongoing care 1 15 []  - 0 Complex (extensive) Patient / Family Education for ongoing care []  - 0 Staff obtains Programmer, systems, Records, Test Results / Process Orders []  - 0 Staff telephones HHA, Nursing Homes / Clarify orders / etc []  - 0 Routine Transfer to another Facility (non-emergent condition) []  - 0 Routine Hospital Admission (non-emergent condition) []  - 0 New Admissions / Biomedical engineer / Ordering NPWT, Apligraf, etc. []  - 0 Emergency Hospital Admission (emergent condition) X- 1 10 Simple Discharge Coordination []  - 0 Complex (extensive) Discharge Coordination PROCESS - Special Needs []  - Pediatric / Minor Patient Management 0 []  - 0 Isolation Patient Management []  - 0 Hearing / Language / Visual special needs []  - 0 Assessment of Community assistance (transportation, D/C planning, etc.) []  - 0 Additional assistance / Altered  mentation []  - 0 Support Surface(s) Assessment (bed, cushion, seat, etc.) INTERVENTIONS - Wound Cleansing / Measurement Maxton, Derel ([id number]) X- 1 5 Simple Wound Cleansing - one wound []  - 0  Complex Wound Cleansing - multiple wounds X- 1 5 Wound Imaging (photographs - any number of wounds) []  - 0 Wound Tracing (instead of photographs) X- 1 5 Simple Wound Measurement - one wound []  - 0 Complex Wound Measurement - multiple wounds INTERVENTIONS - Wound Dressings X - Small Wound Dressing one or multiple wounds 1 10 []  - 0 Medium Wound Dressing one or multiple wounds []  - 0 Large Wound Dressing one or multiple wounds []  - 0 Application of Medications - topical []  - 0 Application of Medications - injection INTERVENTIONS - Miscellaneous []  - External ear exam 0 []  - 0 Specimen Collection (cultures, biopsies, blood, body fluids, etc.) []  - 0 Specimen(s) / Culture(s) sent or taken to Lab for analysis []  - 0 Patient Transfer (multiple staff / Civil Service fast streamer / Similar devices) []  - 0 Simple Staple / Suture removal (25 or less) []  - 0 Complex Staple / Suture removal (26 or more) []  - 0 Hypo / Hyperglycemic Management (close monitor of Blood Glucose) []  - 0 Ankle / Brachial Index (ABI) - do not check if billed separately X- 1 5 Vital Signs Has the patient been seen at the hospital within the last three years: Yes Total Score: 75 Level Of Care: New/Established - Level 2 Electronic Signature(s) Signed: 09/26/2021 1:16:38 PM By: Carlene Coria RN Entered By: Carlene Coria on 09/25/2021 11:04:52 Himebaugh, Louie Casa ([id number]) -------------------------------------------------------------------------------- Encounter Discharge Information Details Patient Name: [name] Date of Service: 09/25/2021 10:15 AM Medical Record Number: [id number] Patient Account Number: [id number] Date of Birth/Sex: February 22, 1956 (66 y.o. M) Treating RN: Carlene Coria Primary Care Onnie Alatorre: Waunita Schooner  Other Clinician: Referring Jayci Ellefson: Waunita Schooner Treating Toris Laverdiere/Extender: Skipper Cliche in Treatment: 4 Encounter Discharge Information Items Discharge Condition: Stable Ambulatory Status: Ambulatory Discharge Destination: Home Transportation: Private Auto Accompanied By: wife Schedule Follow-up Appointment: Yes Clinical Summary of Care: Patient Declined Electronic Signature(s) Signed: 09/26/2021 1:16:38 PM By: Carlene Coria RN Entered By: Carlene Coria on 09/25/2021 11:07:41 [name] ([id number]) -------------------------------------------------------------------------------- Lower Extremity Assessment Details Patient Name: [name] Date of Service: 09/25/2021 10:15 AM Medical Record Number: [id number] Patient Account Number: [id number] Date of Birth/Sex: August 24, 1956 (65 y.o. M) Treating RN: Carlene Coria Primary Care Joselinne Lawal: Waunita Schooner Other Clinician: Referring Golden Gilreath: Waunita Schooner Treating Ellise Kovack/Extender: Jeri Cos Weeks in Treatment: 4 Electronic Signature(s) Signed: 09/26/2021 1:16:38 PM By: Carlene Coria RN Entered By: Carlene Coria on 09/25/2021 55:73:22 [name] ([id number]) -------------------------------------------------------------------------------- Multi Wound Chart Details Patient Name: [name] Date of Service: 09/25/2021 10:15 AM Medical Record Number: [id number] Patient Account Number: [id number] Date of Birth/Sex: [birth year] (65 y.o. M) Treating RN: Carlene Coria Primary Care Peta Peachey: Waunita Schooner Other Clinician: Referring Evalette Montrose: Waunita Schooner Treating Saim Almanza/Extender: Skipper Cliche in Treatment: 4 Vital Signs Height(in): 71 Pulse(bpm): 55 Weight(lbs): 312 Blood Pressure(mmHg): 131/79 Body Mass Index(BMI): 43.5 Temperature(F): 97.7 Respiratory Rate(breaths/min): 18 Photos: [N/A:N/A] Wound Location: Left Chest N/A N/A Wounding Event: Trauma N/A N/A Primary Etiology: Trauma, Other N/A N/A Comorbid  History: Coronary Artery Disease, N/A N/A Hypertension Date Acquired: 07/18/2021 N/A N/A Weeks of Treatment: 4 N/A N/A Wound Status: Open N/A N/A Wound Recurrence: No N/A N/A Measurements L x W x D (cm) 13x3.5x0.1 N/A N/A Area (cm) : 35.736 N/A N/A Volume (cm) : 3.574 N/A N/A % Reduction in Area: -899.90% N/A N/A % Reduction in Volume: -901.10% N/A N/A Classification: Full Thickness Without Exposed N/A N/A Support Structures Exudate Amount: Medium N/A N/A Exudate Type: Serosanguineous N/A N/A Exudate Color: red, brown N/A N/A Granulation Amount: Medium (  34-66%) N/A N/A Granulation Quality: Pink, Pale N/A N/A Necrotic Amount: Medium (34-66%) N/A N/A Exposed Structures: Fat Layer (Subcutaneous Tissue): N/A N/A Yes Fascia: No Tendon: No Muscle: No Joint: No Bone: No Epithelialization: Large (67-100%) N/A N/A Treatment Notes Electronic Signature(s) Signed: 09/26/2021 1:16:38 PM By: Carlene Coria RN Entered By: Carlene Coria on 09/25/2021 11:03:41 [place], Louie Casa ([id number]) -------------------------------------------------------------------------------- [place] Details Patient Name: [name] Date of Service: 09/25/2021 10:15 AM Medical Record Number: [id number] Patient Account Number: [id number] Date of Birth/Sex: [birth year] (65 y.o. M) Treating RN: Carlene Coria Primary Care Jnae Thomaston: Waunita Schooner Other Clinician: Referring Nethra Mehlberg: Waunita Schooner Treating Karyl Sharrar/Extender: Skipper Cliche in Treatment: 4 Active Inactive Wound/Skin Impairment Nursing Diagnoses: Knowledge deficit related to ulceration/compromised skin integrity Goals: Patient/caregiver will verbalize understanding of skin care regimen Date Initiated: 08/28/2021 Target Resolution Date: 09/28/2021 Goal Status: Active Ulcer/skin breakdown will have a volume reduction of 30% by week 4 Date Initiated: 08/28/2021 Target Resolution Date: 09/28/2021 Goal Status: Active Ulcer/skin breakdown  will have a volume reduction of 50% by week 8 Date Initiated: 08/28/2021 Target Resolution Date: 10/26/2021 Goal Status: Active Ulcer/skin breakdown will have a volume reduction of 80% by week 12 Date Initiated: 08/28/2021 Target Resolution Date: 11/26/2021 Goal Status: Active Ulcer/skin breakdown will heal within 14 weeks Date Initiated: 08/28/2021 Target Resolution Date: 12/26/2021 Goal Status: Active Interventions: Assess patient/caregiver ability to obtain necessary supplies Assess patient/caregiver ability to perform ulcer/skin care regimen upon admission and as needed Assess ulceration(s) every visit Notes: Electronic Signature(s) Signed: 09/26/2021 1:16:38 PM By: Carlene Coria RN Entered By: Carlene Coria on 09/25/2021 11:03:12 [place], Louie Casa ([id number]) -------------------------------------------------------------------------------- Pain Assessment Details Patient Name: [name] Date of Service: 09/25/2021 10:15 AM Medical Record Number: [id number] Patient Account Number: [id number] Date of Birth/Sex: 03/21/1956 (66 y.o. M) Treating RN: Carlene Coria Primary Care Zoriah Pulice: Waunita Schooner Other Clinician: Referring Song Myre: Waunita Schooner Treating Tashan Kreitzer/Extender: Skipper Cliche in Treatment: 4 Active Problems Location of Pain Severity and Description of Pain Patient Has Paino No Site Locations Pain Management and Medication Current Pain Management: Electronic Signature(s) Signed: 09/26/2021 1:16:38 PM By: Carlene Coria RN Entered By: Carlene Coria on 09/25/2021 10:24:09 [name] ([id number]) -------------------------------------------------------------------------------- Patient/Caregiver Education Details Patient Name: [name] Date of Service: 09/25/2021 10:15 AM Medical Record Number: [id number] Patient Account Number: [id number] Date of Birth/Gender: March 26, 1956 (65 y.o. M) Treating RN: Carlene Coria Primary Care Physician: Waunita Schooner Other  Clinician: Referring Physician: Waunita Schooner Treating Physician/Extender: Skipper Cliche in Treatment: 4 Education Assessment Education Provided To: Patient Education Topics Provided Wound/Skin Impairment: Methods: Explain/Verbal Responses: State content correctly Electronic Signature(s) Signed: 09/26/2021 1:16:38 PM By: Carlene Coria RN Entered By: Carlene Coria on 09/25/2021 11:05:43 [place], Louie Casa ([id number]) -------------------------------------------------------------------------------- Wound Assessment Details Patient Name: [name] Date of Service: 09/25/2021 10:15 AM Medical Record Number: [id number] Patient Account Number: [id number] Date of Birth/Sex: 1956-04-30 (65 y.o. M) Treating RN: Carlene Coria Primary Care Vince Ainsley: Waunita Schooner Other Clinician: Referring Jassiah Viviano: Waunita Schooner Treating Coyle Stordahl/Extender: Jeri Cos Weeks in Treatment: 4 Wound Status Wound Number: 1 Primary Etiology: Trauma, Other Wound Location: Left Chest Wound Status: Open Wounding Event: Trauma Comorbid History: Coronary Artery Disease, Hypertension Date Acquired: 07/18/2021 Weeks Of Treatment: 4 Clustered Wound: No Photos Wound Measurements Length: (cm) 13 Width: (cm) 3.5 Depth: (cm) 0.1 Area: (cm) 35.736 Volume: (cm) 3.574 % Reduction in Area: -899.9% % Reduction in Volume: -901.1% Epithelialization: Large (67-100%) Tunneling: No Undermining: No Wound Description Classification: Full Thickness Without Exposed Support Structures Exudate Amount: Medium Exudate Type: Serosanguineous Exudate Color:  red, brown Foul Odor After Cleansing: No Slough/Fibrino Yes Wound Bed Granulation Amount: Medium (34-66%) Exposed Structure Granulation Quality: Pink, Pale Fascia Exposed: No Necrotic Amount: Medium (34-66%) Fat Layer (Subcutaneous Tissue) Exposed: Yes Necrotic Quality: Adherent Slough Tendon Exposed: No Muscle Exposed: No Joint Exposed: No Bone Exposed:  No Treatment Notes Wound #1 (Chest) Wound Laterality: Left Cleanser Soap and Water Discharge Instruction: Gently cleanse wound with antibacterial soap, rinse and pat dry prior to dressing wounds Peri-Wound Care Klich, Louie Casa ([id number]) Topical eucerin Discharge Instruction: apply to periwound Primary Dressing Xeroform 4x4-HBD (in/in) Discharge Instruction: Apply Xeroform 4x4-HBD (in/in) as directed Secondary Dressing Coverlet Latex-Free Fabric Adhesive Dressings Discharge Instruction: 1.5 x 2 Secured With Compression Wrap Compression Stockings Add-Ons Electronic Signature(s) Signed: 09/26/2021 1:16:38 PM By: Carlene Coria RN Entered By: Carlene Coria on 09/25/2021 10:28:09 [name] ([id number]) -------------------------------------------------------------------------------- Vitals Details Patient Name: [name] Date of Service: 09/25/2021 10:15 AM Medical Record Number: [id number] Patient Account Number: [id number] Date of Birth/Sex: [birth year] (66 y.o. M) Treating RN: Carlene Coria Primary Care Raphaella Larkin: Waunita Schooner Other Clinician: Referring Cambryn Charters: Waunita Schooner Treating Damier Disano/Extender: Skipper Cliche in Treatment: 4 Vital Signs Time Taken: 10:19 Temperature (F): 97.7 Height (in): 71 Pulse (bpm): 55 Weight (lbs): 312 Respiratory Rate (breaths/min): 18 Body Mass Index (BMI): 43.5 Blood Pressure (mmHg): 131/79 Reference Range: 80 - 120 mg / dl Electronic Signature(s) Signed: 09/26/2021 1:16:38 PM By: Carlene Coria RN Entered By: Carlene Coria on 09/25/2021 10:23:52

## 2021-10-02 ENCOUNTER — Encounter: Payer: Medicare HMO | Attending: Physician Assistant | Admitting: Physician Assistant

## 2021-10-02 ENCOUNTER — Other Ambulatory Visit: Payer: Self-pay

## 2021-10-02 DIAGNOSIS — I2510 Atherosclerotic heart disease of native coronary artery without angina pectoris: Secondary | ICD-10-CM | POA: Diagnosis not present

## 2021-10-02 DIAGNOSIS — L98492 Non-pressure chronic ulcer of skin of other sites with fat layer exposed: Secondary | ICD-10-CM | POA: Diagnosis not present

## 2021-10-02 DIAGNOSIS — L988 Other specified disorders of the skin and subcutaneous tissue: Secondary | ICD-10-CM | POA: Insufficient documentation

## 2021-10-02 DIAGNOSIS — I10 Essential (primary) hypertension: Secondary | ICD-10-CM | POA: Diagnosis not present
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
## 2021-10-04 NOTE — Progress Notes (Signed)
ABDULRAHEEM, PINEO ([id number]) Visit Report for 10/02/2021 Arrival Information Details Patient Name: [name], [name] Date of Service: 10/02/2021 2:45 PM Medical Record Number: [id number] Patient Account Number: [id number] Date of Birth/Sex: Nov 08, 1955 (66 y.o. M) Treating RN: Carlene Coria Primary Care Hyun Marsalis: Waunita Schooner Other Clinician: Referring Akshath Mccarey: Waunita Schooner Treating Larico Dimock/Extender: Skipper Cliche in Treatment: 5 Visit Information History Since Last Visit All ordered tests and consults were completed: No Patient Arrived: Ambulatory Added or deleted any medications: No Arrival Time: 14:41 Any new allergies or adverse reactions: No Accompanied By: self Had a fall or experienced change in No Transfer Assistance: None activities of daily living that may affect Patient Identification Verified: Yes risk of falls: Secondary Verification Process Completed: Yes Signs or symptoms of abuse/neglect since last visito No Patient Requires Transmission-Based Precautions: No Hospitalized since last visit: No Patient Has Alerts: No Implantable device outside of the clinic excluding No cellular tissue based products placed in the center since last visit: Has Dressing in Place as Prescribed: Yes Pain Present Now: No Electronic Signature(s) Signed: 10/04/2021 8:26:07 AM By: Carlene Coria RN Entered By: Carlene Coria on 10/02/2021 14:48:58 Eden, Louie Casa ([id number]) -------------------------------------------------------------------------------- Clinic Level of Care Assessment Details Patient Name: [name] Date of Service: 10/02/2021 2:45 PM Medical Record Number: [id number] Patient Account Number: [id number] Date of Birth/Sex: 1955-09-28 (66 y.o. M) Treating RN: Carlene Coria Primary Care Amiah Frohlich: Waunita Schooner Other Clinician: Referring Romonia Yanik: Waunita Schooner Treating Grace Haggart/Extender: Skipper Cliche in Treatment: 5 Clinic Level of Care Assessment Items TOOL 4 Quantity  Score X - Use when only an EandM is performed on FOLLOW-UP visit 1 0 ASSESSMENTS - Nursing Assessment / Reassessment X - Reassessment of Co-morbidities (includes updates in patient status) 1 10 X- 1 5 Reassessment of Adherence to Treatment Plan ASSESSMENTS - Wound and Skin Assessment / Reassessment X - Simple Wound Assessment / Reassessment - one wound 1 5 []  - 0 Complex Wound Assessment / Reassessment - multiple wounds []  - 0 Dermatologic / Skin Assessment (not related to wound area) ASSESSMENTS - Focused Assessment []  - Circumferential Edema Measurements - multi extremities 0 []  - 0 Nutritional Assessment / Counseling / Intervention []  - 0 Lower Extremity Assessment (monofilament, tuning fork, pulses) []  - 0 Peripheral Arterial Disease Assessment (using hand held doppler) ASSESSMENTS - Ostomy and/or Continence Assessment and Care []  - Incontinence Assessment and Management 0 []  - 0 Ostomy Care Assessment and Management (repouching, etc.) PROCESS - Coordination of Care X - Simple Patient / Family Education for ongoing care 1 15 []  - 0 Complex (extensive) Patient / Family Education for ongoing care []  - 0 Staff obtains Programmer, systems, Records, Test Results / Process Orders []  - 0 Staff telephones HHA, Nursing Homes / Clarify orders / etc []  - 0 Routine Transfer to another Facility (non-emergent condition) []  - 0 Routine Hospital Admission (non-emergent condition) []  - 0 New Admissions / Biomedical engineer / Ordering NPWT, Apligraf, etc. []  - 0 Emergency Hospital Admission (emergent condition) X- 1 10 Simple Discharge Coordination []  - 0 Complex (extensive) Discharge Coordination PROCESS - Special Needs []  - Pediatric / Minor Patient Management 0 []  - 0 Isolation Patient Management []  - 0 Hearing / Language / Visual special needs []  - 0 Assessment of Community assistance (transportation, D/C planning, etc.) []  - 0 Additional assistance / Altered mentation []  -  0 Support Surface(s) Assessment (bed, cushion, seat, etc.) INTERVENTIONS - Wound Cleansing / Measurement Wos, Kaye ([id number]) X- 1 5 Simple Wound Cleansing - one wound []  - 0  Complex Wound Cleansing - multiple wounds X- 1 5 Wound Imaging (photographs - any number of wounds) []  - 0 Wound Tracing (instead of photographs) X- 1 5 Simple Wound Measurement - one wound []  - 0 Complex Wound Measurement - multiple wounds INTERVENTIONS - Wound Dressings X - Small Wound Dressing one or multiple wounds 1 10 []  - 0 Medium Wound Dressing one or multiple wounds []  - 0 Large Wound Dressing one or multiple wounds []  - 0 Application of Medications - topical []  - 0 Application of Medications - injection INTERVENTIONS - Miscellaneous []  - External ear exam 0 []  - 0 Specimen Collection (cultures, biopsies, blood, body fluids, etc.) []  - 0 Specimen(s) / Culture(s) sent or taken to Lab for analysis []  - 0 Patient Transfer (multiple staff / Civil Service fast streamer / Similar devices) []  - 0 Simple Staple / Suture removal (25 or less) []  - 0 Complex Staple / Suture removal (26 or more) []  - 0 Hypo / Hyperglycemic Management (close monitor of Blood Glucose) []  - 0 Ankle / Brachial Index (ABI) - do not check if billed separately X- 1 5 Vital Signs Has the patient been seen at the hospital within the last three years: Yes Total Score: 75 Level Of Care: New/Established - Level 2 Electronic Signature(s) Signed: 10/04/2021 8:26:07 AM By: Carlene Coria RN Entered By: Carlene Coria on 10/02/2021 15:18:56 Bagg, Louie Casa ([id number]) -------------------------------------------------------------------------------- Encounter Discharge Information Details Patient Name: [name] Date of Service: 10/02/2021 2:45 PM Medical Record Number: [id number] Patient Account Number: [id number] Date of Birth/Sex: 08/14/1956 (66 y.o. M) Treating RN: Carlene Coria Primary Care Saphyre Cillo: Waunita Schooner Other  Clinician: Referring Jatoya Armbrister: Waunita Schooner Treating Edmar Blankenburg/Extender: Skipper Cliche in Treatment: 5 Encounter Discharge Information Items Discharge Condition: Stable Ambulatory Status: Ambulatory Discharge Destination: Home Transportation: Private Auto Accompanied By: wife Schedule Follow-up Appointment: Yes Clinical Summary of Care: Patient Declined Electronic Signature(s) Signed: 10/02/2021 3:21:15 PM By: Carlene Coria RN Entered By: Carlene Coria on 10/02/2021 15:21:14 [name] ([id number]) -------------------------------------------------------------------------------- Lower Extremity Assessment Details Patient Name: [name] Date of Service: 10/02/2021 2:45 PM Medical Record Number: [id number] Patient Account Number: [id number] Date of Birth/Sex: 27-Mar-1956 (66 y.o. M) Treating RN: Carlene Coria Primary Care Joud Ingwersen: Waunita Schooner Other Clinician: Referring Titus Drone: Waunita Schooner Treating Chelbi Herber/Extender: Jeri Cos Weeks in Treatment: 5 Electronic Signature(s) Signed: 10/04/2021 8:26:07 AM By: Carlene Coria RN Entered By: Carlene Coria on 10/02/2021 14:55:36 Requejo, Louie Casa ([id number]) -------------------------------------------------------------------------------- Multi Wound Chart Details Patient Name: [name] Date of Service: 10/02/2021 2:45 PM Medical Record Number: [id number] Patient Account Number: [id number] Date of Birth/Sex: February 29, 1956 (65 y.o. M) Treating RN: Carlene Coria Primary Care Maliaka Brasington: Waunita Schooner Other Clinician: Referring Marnita Poirier: Waunita Schooner Treating Cheryel Kyte/Extender: Skipper Cliche in Treatment: 5 Vital Signs Height(in): 71 Pulse(bpm): 25 Weight(lbs): 312 Blood Pressure(mmHg): 124/81 Body Mass Index(BMI): 43.5 Temperature(F): 97.6 Respiratory Rate(breaths/min): 18 Photos: [N/A:N/A] Wound Location: Left Chest N/A N/A Wounding Event: Trauma N/A N/A Primary Etiology: Trauma, Other N/A N/A Comorbid History: Coronary  Artery Disease, N/A N/A Hypertension Date Acquired: 07/18/2021 N/A N/A Weeks of Treatment: 5 N/A N/A Wound Status: Open N/A N/A Wound Recurrence: No N/A N/A Measurements L x W x D (cm) 2.3x0.7x0.1 N/A N/A Area (cm) : 1.264 N/A N/A Volume (cm) : 0.126 N/A N/A % Reduction in Area: 64.60% N/A N/A % Reduction in Volume: 64.70% N/A N/A Classification: Full Thickness Without Exposed N/A N/A Support Structures Exudate Amount: Medium N/A N/A Exudate Type: Serosanguineous N/A N/A Exudate Color: red, brown N/A N/A Granulation Amount: Medium (  34-66%) N/A N/A Granulation Quality: Pink, Pale N/A N/A Necrotic Amount: Medium (34-66%) N/A N/A Exposed Structures: Fat Layer (Subcutaneous Tissue): N/A N/A Yes Fascia: No Tendon: No Muscle: No Joint: No Bone: No Epithelialization: Large (67-100%) N/A N/A Treatment Notes Electronic Signature(s) Signed: 10/04/2021 8:26:07 AM By: Carlene Coria RN Entered By: Carlene Coria on 10/02/2021 15:00:30 [name] ([id number]) -------------------------------------------------------------------------------- Multi-Disciplinary Care Plan Details Patient Name: [name] Date of Service: 10/02/2021 2:45 PM Medical Record Number: [id number] Patient Account Number: [id number] Date of Birth/Sex: Sep 01, 1955 (65 y.o. M) Treating RN: Carlene Coria Primary Care Annica Marinello: Waunita Schooner Other Clinician: Referring Adeline Petitfrere: Waunita Schooner Treating Deontray Hunnicutt/Extender: Skipper Cliche in Treatment: 5 Active Inactive Wound/Skin Impairment Nursing Diagnoses: Knowledge deficit related to ulceration/compromised skin integrity Goals: Patient/caregiver will verbalize understanding of skin care regimen Date Initiated: 08/28/2021 Target Resolution Date: 10/26/2021 Goal Status: Active Ulcer/skin breakdown will have a volume reduction of 30% by week 4 Date Initiated: 08/28/2021 Date Inactivated: 10/02/2021 Target Resolution Date: 09/28/2021 Goal Status: Unmet Unmet Reason:  scar tissue Ulcer/skin breakdown will have a volume reduction of 50% by week 8 Date Initiated: 08/28/2021 Target Resolution Date: 10/26/2021 Goal Status: Active Ulcer/skin breakdown will have a volume reduction of 80% by week 12 Date Initiated: 08/28/2021 Target Resolution Date: 11/26/2021 Goal Status: Active Ulcer/skin breakdown will heal within 14 weeks Date Initiated: 08/28/2021 Target Resolution Date: 12/26/2021 Goal Status: Active Interventions: Assess patient/caregiver ability to obtain necessary supplies Assess patient/caregiver ability to perform ulcer/skin care regimen upon admission and as needed Assess ulceration(s) every visit Notes: Electronic Signature(s) Signed: 10/04/2021 8:26:07 AM By: Carlene Coria RN Entered By: Carlene Coria on 10/02/2021 14:59:37 [place], Louie Casa ([id number]) -------------------------------------------------------------------------------- Pain Assessment Details Patient Name: [name] Date of Service: 10/02/2021 2:45 PM Medical Record Number: [id number] Patient Account Number: [id number] Date of Birth/Sex: May 31, 1956 (65 y.o. M) Treating RN: Carlene Coria Primary Care Cordie Buening: Waunita Schooner Other Clinician: Referring Brinda Focht: Waunita Schooner Treating Yeray Tomas/Extender: Skipper Cliche in Treatment: 5 Active Problems Location of Pain Severity and Description of Pain Patient Has Paino No Site Locations Pain Management and Medication Current Pain Management: Electronic Signature(s) Signed: 10/04/2021 8:26:07 AM By: Carlene Coria RN Entered By: Carlene Coria on 10/02/2021 14:50:55 [name] ([id number]) -------------------------------------------------------------------------------- Patient/Caregiver Education Details Patient Name: [name] Date of Service: 10/02/2021 2:45 PM Medical Record Number: [id number] Patient Account Number: [id number] Date of Birth/Gender: August 30, 1955 (65 y.o. M) Treating RN: Carlene Coria Primary Care Physician: Waunita Schooner Other Clinician: Referring Physician: Waunita Schooner Treating Physician/Extender: Skipper Cliche in Treatment: 5 Education Assessment Education Provided To: Patient Education Topics Provided Wound/Skin Impairment: Methods: Explain/Verbal Responses: State content correctly Electronic Signature(s) Signed: 10/04/2021 8:26:07 AM By: Carlene Coria RN Entered By: Carlene Coria on 10/02/2021 15:20:10 [name] ([id number]) -------------------------------------------------------------------------------- Wound Assessment Details Patient Name: [name] Date of Service: 10/02/2021 2:45 PM Medical Record Number: [id number] Patient Account Number: [id number] Date of Birth/Sex: Sep 16, 1955 (65 y.o. M) Treating RN: Carlene Coria Primary Care Rane Dumm: Waunita Schooner Other Clinician: Referring Truth Barot: Waunita Schooner Treating Nataliyah Packham/Extender: Jeri Cos Weeks in Treatment: 5 Wound Status Wound Number: 1 Primary Etiology: Trauma, Other Wound Location: Left Chest Wound Status: Open Wounding Event: Trauma Comorbid History: Coronary Artery Disease, Hypertension Date Acquired: 07/18/2021 Weeks Of Treatment: 5 Clustered Wound: No Photos Wound Measurements Length: (cm) 2.3 Width: (cm) 0.7 Depth: (cm) 0.1 Area: (cm) 1.264 Volume: (cm) 0.126 % Reduction in Area: 64.6% % Reduction in Volume: 64.7% Epithelialization: Large (67-100%) Tunneling: No Undermining: No Wound Description Classification: Full Thickness Without Exposed Support Structures Exudate  Amount: Medium Exudate Type: Serosanguineous Exudate Color: red, brown Foul Odor After Cleansing: No Slough/Fibrino Yes Wound Bed Granulation Amount: Medium (34-66%) Exposed Structure Granulation Quality: Pink, Pale Fascia Exposed: No Necrotic Amount: Medium (34-66%) Fat Layer (Subcutaneous Tissue) Exposed: Yes Necrotic Quality: Adherent Slough Tendon Exposed: No Muscle Exposed: No Joint Exposed: No Bone Exposed:  No Treatment Notes Wound #1 (Chest) Wound Laterality: Left Cleanser Soap and Water Discharge Instruction: Gently cleanse wound with antibacterial soap, rinse and pat dry prior to dressing wounds Peri-Wound Care Salzman, Louie Casa ([id number]) Topical eucerin Discharge Instruction: apply to periwound Primary Dressing Xeroform 4x4-HBD (in/in) Discharge Instruction: Apply Xeroform 4x4-HBD (in/in) as directed Secondary Dressing Coverlet Latex-Free Fabric Adhesive Dressings Discharge Instruction: 1.5 x 2 Secured With Compression Wrap Compression Stockings Add-Ons Electronic Signature(s) Signed: 10/04/2021 8:26:07 AM By: Carlene Coria RN Entered By: Carlene Coria on 10/02/2021 14:55:23 Degroff, Louie Casa ([id number]) -------------------------------------------------------------------------------- Vitals Details Patient Name: [name] Date of Service: 10/02/2021 2:45 PM Medical Record Number: [id number] Patient Account Number: [id number] Date of Birth/Sex: 1955/10/23 (65 y.o. M) Treating RN: Carlene Coria Primary Care Cleaster Shiffer: Waunita Schooner Other Clinician: Referring Elira Colasanti: Waunita Schooner Treating Makynzee Tigges/Extender: Skipper Cliche in Treatment: 5 Vital Signs Time Taken: 14:50 Temperature (F): 97.6 Height (in): 71 Pulse (bpm): 62 Weight (lbs): 312 Respiratory Rate (breaths/min): 18 Body Mass Index (BMI): 43.5 Blood Pressure (mmHg): 124/81 Reference Range: 80 - 120 mg / dl Electronic Signature(s) Signed: 10/04/2021 8:26:07 AM By: Carlene Coria RN Entered By: Carlene Coria on 10/02/2021 14:50:42

## 2021-10-08 DIAGNOSIS — H15102 Unspecified episcleritis, left eye: Secondary | ICD-10-CM | POA: Diagnosis not present

## 2021-10-09 ENCOUNTER — Other Ambulatory Visit: Payer: Self-pay

## 2021-10-09 DIAGNOSIS — I10 Essential (primary) hypertension: Secondary | ICD-10-CM | POA: Diagnosis not present

## 2021-10-09 DIAGNOSIS — L988 Other specified disorders of the skin and subcutaneous tissue: Secondary | ICD-10-CM | POA: Diagnosis not present

## 2021-10-09 DIAGNOSIS — L98492 Non-pressure chronic ulcer of skin of other sites with fat layer exposed: Secondary | ICD-10-CM | POA: Diagnosis not present

## 2021-10-09 DIAGNOSIS — I2510 Atherosclerotic heart disease of native coronary artery without angina pectoris: Secondary | ICD-10-CM | POA: Diagnosis not present

## 2021-10-10 DIAGNOSIS — H15102 Unspecified episcleritis, left eye: Secondary | ICD-10-CM | POA: Diagnosis not present

## 2021-10-11 NOTE — Progress Notes (Signed)
LEODIS, ALCOCER ([id number]) Visit Report for 10/09/2021 Physician Orders Details Patient Name: AGUSTIN, SWATEK Date of Service: 10/09/2021 10:45 AM Medical Record Number: [id number] Patient Account Number: [id number] Date of Birth/Sex: [birth year] (66 y.o. M) Treating RN: Carlene Coria Primary Care Provider: Waunita Schooner Other Clinician: Referring Provider: Waunita Schooner Treating Provider/Extender: Skipper Cliche in Treatment: 6 Verbal / Phone Orders: No Diagnosis Coding Follow-up Appointments o Return Appointment in 1 week. Wound Treatment Wound #1 - Chest Wound Laterality: Left Cleanser: Soap and Water 1 x Per Day/30 Days Discharge Instructions: Gently cleanse wound with antibacterial soap, rinse and pat dry prior to dressing wounds Topical: eucerin 1 x Per Day/30 Days Discharge Instructions: apply to periwound Primary Dressing: Xeroform 4x4-HBD (in/in) 1 x Per Day/30 Days Discharge Instructions: Apply Xeroform 4x4-HBD (in/in) as directed Secondary Dressing: Coverlet Latex-Free Fabric Adhesive Dressings 1 x Per Day/30 Days Discharge Instructions: 1.5 x 2 Electronic Signature(s) Signed: 10/09/2021 2:51:33 PM By: Worthy Keeler PA-C Signed: 10/11/2021 9:01:13 AM By: Carlene Coria RN Entered By: Carlene Coria on 10/09/2021 10:32:43 Baum, Louie Casa ([id number]) -------------------------------------------------------------------------------- SuperBill Details Patient Name: [name] Date of Service: 10/09/2021 Medical Record Number: [id number] Patient Account Number: [id number] Date of Birth/Sex: 1956/08/25 (65 y.o. M) Treating RN: Carlene Coria Primary Care Provider: Waunita Schooner Other Clinician: Referring Provider: Waunita Schooner Treating Provider/Extender: Skipper Cliche in Treatment: 6 Diagnosis Coding ICD-10 Codes Code Description L98.8 Other specified disorders of the skin and subcutaneous tissue L98.492 Non-pressure chronic ulcer of skin of other sites with fat layer  exposed I10 Essential (primary) hypertension I25.10 Atherosclerotic heart disease of native coronary artery without angina pectoris Facility Procedures CPT4 Code: 01749449 Description: 67591 - WOUND CARE VISIT-LEV 2 EST PT Modifier: Quantity: 1 Electronic Signature(s) Signed: 10/09/2021 2:51:33 PM By: Worthy Keeler PA-C Signed: 10/11/2021 9:01:13 AM By: Carlene Coria RN Entered By: Carlene Coria on 10/09/2021 10:33:42

## 2021-10-11 NOTE — Progress Notes (Signed)
REMMINGTON, [name] ([id number]) Visit Report for 10/09/2021 Arrival Information Details Patient Name: [name] Date of Service: 10/09/2021 10:45 AM Medical Record Number: [id number] Patient Account Number: [id number] Date of Birth/Sex: 28-Jun-1956 (66 y.o. M) Treating RN: Carlene Coria Primary Care Manya Balash: Waunita Schooner Other Clinician: Referring Elenor Wildes: Waunita Schooner Treating Laureano Hetzer/Extender: Skipper Cliche in Treatment: 6 Visit Information History Since Last Visit All ordered tests and consults were completed: No Patient Arrived: Ambulatory Added or deleted any medications: No Arrival Time: 10:20 Any new allergies or adverse reactions: No Accompanied By: self Had a fall or experienced change in No Transfer Assistance: None activities of daily living that may affect Patient Identification Verified: Yes risk of falls: Secondary Verification Process Completed: Yes Signs or symptoms of abuse/neglect since last visito No Patient Requires Transmission-Based Precautions: No Hospitalized since last visit: No Patient Has Alerts: No Implantable device outside of the clinic excluding No cellular tissue based products placed in the center since last visit: Has Dressing in Place as Prescribed: Yes Pain Present Now: No Electronic Signature(s) Signed: 10/11/2021 9:01:13 AM By: Carlene Coria RN Entered By: Carlene Coria on 10/09/2021 10:31:41 [name] ([id number]) -------------------------------------------------------------------------------- Clinic Level of Care Assessment Details Patient Name: [name] Date of Service: 10/09/2021 10:45 AM Medical Record Number: [id number] Patient Account Number: [id number] Date of Birth/Sex: 1955-10-07 (66 y.o. M) Treating RN: Carlene Coria Primary Care Anwen Cannedy: Waunita Schooner Other Clinician: Referring Shalen Petrak: Waunita Schooner Treating Thierno Hun/Extender: Skipper Cliche in Treatment: 6 Clinic Level of Care Assessment Items TOOL 4  Quantity Score X - Use when only an EandM is performed on FOLLOW-UP visit 1 0 ASSESSMENTS - Nursing Assessment / Reassessment X - Reassessment of Co-morbidities (includes updates in patient status) 1 10 X- 1 5 Reassessment of Adherence to Treatment Plan ASSESSMENTS - Wound and Skin Assessment / Reassessment X - Simple Wound Assessment / Reassessment - one wound 1 5 []  - 0 Complex Wound Assessment / Reassessment - multiple wounds []  - 0 Dermatologic / Skin Assessment (not related to wound area) ASSESSMENTS - Focused Assessment []  - Circumferential Edema Measurements - multi extremities 0 []  - 0 Nutritional Assessment / Counseling / Intervention []  - 0 Lower Extremity Assessment (monofilament, tuning fork, pulses) []  - 0 Peripheral Arterial Disease Assessment (using hand held doppler) ASSESSMENTS - Ostomy and/or Continence Assessment and Care []  - Incontinence Assessment and Management 0 []  - 0 Ostomy Care Assessment and Management (repouching, etc.) PROCESS - Coordination of Care X - Simple Patient / Family Education for ongoing care 1 15 []  - 0 Complex (extensive) Patient / Family Education for ongoing care []  - 0 Staff obtains Programmer, systems, Records, Test Results / Process Orders []  - 0 Staff telephones HHA, Nursing Homes / Clarify orders / etc []  - 0 Routine Transfer to another Facility (non-emergent condition) []  - 0 Routine Hospital Admission (non-emergent condition) []  - 0 New Admissions / Biomedical engineer / Ordering NPWT, Apligraf, etc. []  - 0 Emergency Hospital Admission (emergent condition) X- 1 10 Simple Discharge Coordination []  - 0 Complex (extensive) Discharge Coordination PROCESS - Special Needs []  - Pediatric / Minor Patient Management 0 []  - 0 Isolation Patient Management []  - 0 Hearing / Language / Visual special needs []  - 0 Assessment of Community assistance (transportation, D/C planning, etc.) []  - 0 Additional assistance / Altered  mentation []  - 0 Support Surface(s) Assessment (bed, cushion, seat, etc.) INTERVENTIONS - Wound Cleansing / Measurement Fees, Gilmer ([id number]) X- 1 5 Simple Wound Cleansing - one wound []  - 0  Complex Wound Cleansing - multiple wounds []  - 0 Wound Imaging (photographs - any number of wounds) []  - 0 Wound Tracing (instead of photographs) X- 1 5 Simple Wound Measurement - one wound []  - 0 Complex Wound Measurement - multiple wounds INTERVENTIONS - Wound Dressings X - Small Wound Dressing one or multiple wounds 1 10 []  - 0 Medium Wound Dressing one or multiple wounds []  - 0 Large Wound Dressing one or multiple wounds []  - 0 Application of Medications - topical []  - 0 Application of Medications - injection INTERVENTIONS - Miscellaneous []  - External ear exam 0 []  - 0 Specimen Collection (cultures, biopsies, blood, body fluids, etc.) []  - 0 Specimen(s) / Culture(s) sent or taken to Lab for analysis []  - 0 Patient Transfer (multiple staff / Civil Service fast streamer / Similar devices) []  - 0 Simple Staple / Suture removal (25 or less) []  - 0 Complex Staple / Suture removal (26 or more) []  - 0 Hypo / Hyperglycemic Management (close monitor of Blood Glucose) []  - 0 Ankle / Brachial Index (ABI) - do not check if billed separately []  - 0 Vital Signs Has the patient been seen at the hospital within the last three years: Yes Total Score: 65 Level Of Care: New/Established - Level 2 Electronic Signature(s) Signed: 10/11/2021 9:01:13 AM By: Carlene Coria RN Entered By: Carlene Coria on 10/09/2021 10:33:33 [name] ([id number]) -------------------------------------------------------------------------------- Encounter Discharge Information Details Patient Name: [name] Date of Service: 10/09/2021 10:45 AM Medical Record Number: [id number] Patient Account Number: [id number] Date of Birth/Sex: 01/11/1956 (65 y.o. M) Treating RN: Carlene Coria Primary Care Tyray Proch: Waunita Schooner  Other Clinician: Referring Lexa Coronado: Waunita Schooner Treating Shatona Andujar/Extender: Skipper Cliche in Treatment: 6 Encounter Discharge Information Items Discharge Condition: Stable Ambulatory Status: Ambulatory Discharge Destination: Home Transportation: Private Auto Accompanied By: self Schedule Follow-up Appointment: Yes Clinical Summary of Care: Patient Declined Electronic Signature(s) Signed: 10/11/2021 9:01:13 AM By: Carlene Coria RN Entered By: Carlene Coria on 10/09/2021 10:33:10 [name] ([id number]) -------------------------------------------------------------------------------- Wound Assessment Details Patient Name: [name] Date of Service: 10/09/2021 10:45 AM Medical Record Number: [id number] Patient Account Number: [id number] Date of Birth/Sex: [birth year] (65 y.o. M) Treating RN: Carlene Coria Primary Care Epiphany Seltzer: Waunita Schooner Other Clinician: Referring Chaniece Barbato: Waunita Schooner Treating Jermal Dismuke/Extender: Jeri Cos Weeks in Treatment: 6 Wound Status Wound Number: 1 Primary Etiology: Trauma, Other Wound Location: Left Chest Wound Status: Open Wounding Event: Trauma Comorbid History: Coronary Artery Disease, Hypertension Date Acquired: 07/18/2021 Weeks Of Treatment: 6 Clustered Wound: No Wound Measurements Length: (cm) 2.3 Width: (cm) 0.7 Depth: (cm) 0.1 Area: (cm) 1.264 Volume: (cm) 0.126 % Reduction in Area: 64.6% % Reduction in Volume: 64.7% Epithelialization: Large (67-100%) Tunneling: No Undermining: No Wound Description Classification: Full Thickness Without Exposed Support Structu Exudate Amount: Medium Exudate Type: Serosanguineous Exudate Color: red, brown res Foul Odor After Cleansing: No Slough/Fibrino Yes Wound Bed Granulation Amount: Medium (34-66%) Exposed Structure Granulation Quality: Pink, Pale Fascia Exposed: No Necrotic Amount: Small (1-33%) Fat Layer (Subcutaneous Tissue) Exposed: Yes Necrotic Quality: Adherent  Slough Tendon Exposed: No Muscle Exposed: No Joint Exposed: No Bone Exposed: No Treatment Notes Wound #1 (Chest) Wound Laterality: Left Cleanser Soap and Water Discharge Instruction: Gently cleanse wound with antibacterial soap, rinse and pat dry prior to dressing wounds Peri-Wound Care Topical eucerin Discharge Instruction: apply to periwound Primary Dressing Xeroform 4x4-HBD (in/in) Discharge Instruction: Apply Xeroform 4x4-HBD (in/in) as directed Secondary Dressing Coverlet Latex-Free Fabric Adhesive Dressings Discharge Instruction: 1.5 x 2 Secured With Compression Wrap [name], Kevin ([id number])  Compression Stockings Add-Ons Electronic Signature(s) Signed: 10/11/2021 9:01:13 AM By: Carlene Coria RN Entered By: Carlene Coria on 10/09/2021 10:32:10

## 2021-10-16 ENCOUNTER — Encounter: Payer: Medicare HMO | Admitting: Physician Assistant

## 2021-10-16 ENCOUNTER — Other Ambulatory Visit: Payer: Self-pay

## 2021-10-16 DIAGNOSIS — I10 Essential (primary) hypertension: Secondary | ICD-10-CM | POA: Diagnosis not present

## 2021-10-16 DIAGNOSIS — L98492 Non-pressure chronic ulcer of skin of other sites with fat layer exposed: Secondary | ICD-10-CM | POA: Diagnosis not present

## 2021-10-16 DIAGNOSIS — L988 Other specified disorders of the skin and subcutaneous tissue: Secondary | ICD-10-CM | POA: Diagnosis not present

## 2021-10-16 DIAGNOSIS — I2510 Atherosclerotic heart disease of native coronary artery without angina pectoris: Secondary | ICD-10-CM | POA: Diagnosis not present
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
## 2021-10-16 NOTE — Progress Notes (Signed)
[name], [name] ([id number]) Visit Report for 10/16/2021 Arrival Information Details Patient Name: [name], [name] Date of Service: 10/16/2021 11:30 AM Medical Record Number: [id number] Patient Account Number: [id number] Date of Birth/Sex: 09-25-1955 (66 y.o. M) Treating RN: [name] Primary Care [name]: [name] Other Clinician: Referring [name]: [name] Treating [name]/Extender: [name] in Treatment: 7 Visit Information History Since Last Visit Added or deleted any medications: No Patient Arrived: Ambulatory Had a fall or experienced change in No Arrival Time: 11:24 activities of daily living that may affect Accompanied By: self risk of falls: Transfer Assistance: None Hospitalized since last visit: No Patient Identification Verified: Yes Has Dressing in Place as Prescribed: Yes Secondary Verification Process Completed: Yes Pain Present Now: No Patient Requires Transmission-Based Precautions: No Patient Has Alerts: No Electronic Signature(s) Signed: 10/16/2021 3:51:14 PM By: [name] Entered By: [name] on 10/16/2021 11:24:17 [name] ([id number]) -------------------------------------------------------------------------------- Clinic Level of Care Assessment Details Patient Name: [name] Date of Service: 10/16/2021 11:30 AM Medical Record Number: [id number] Patient Account Number: [id number] Date of Birth/Sex: 1956/07/08 (66 y.o. M) Treating RN: [name] Primary Care Jadarrius Maselli: [name] Other Clinician: Referring Addilyne Backs: [name] Treating Lyan Holck/Extender: [name] in Treatment: 7 Clinic Level of Care Assessment Items TOOL 4 Quantity Score _0  - Use when only an EandM is performed on FOLLOW-UP visit 0 ASSESSMENTS - Nursing Assessment / Reassessment _1  - Reassessment of Co-morbidities (includes updates in patient status) 0 _2  - 0 Reassessment of Adherence to Treatment Plan ASSESSMENTS - Wound and Skin  Assessment / Reassessment X - Simple Wound Assessment / Reassessment - one wound 1 5 _3  - 0 Complex Wound Assessment / Reassessment - multiple wounds _4  - 0 Dermatologic / Skin Assessment (not related to wound area) ASSESSMENTS - Focused Assessment _5  - Circumferential Edema Measurements - multi extremities 0 _6  - 0 Nutritional Assessment / Counseling / Intervention _7  - 0 Lower Extremity Assessment (monofilament, tuning fork, pulses) _8  - 0 Peripheral Arterial Disease Assessment (using hand held doppler) ASSESSMENTS - Ostomy and/or Continence Assessment and Care _9  - Incontinence Assessment and Management 0 _10  - 0 Ostomy Care Assessment and Management (repouching, etc.) PROCESS - Coordination of Care X - Simple Patient / Family Education for ongoing care 1 15 _11  - 0 Complex (extensive) Patient / Family Education for ongoing care _12  - 0 Staff obtains Programmer, systems, Records, Test Results / Process Orders _13  - 0 Staff telephones HHA, Nursing Homes / Clarify orders / etc _14  - 0 Routine Transfer to another Facility (non-emergent condition) _15  - 0 Routine Hospital Admission (non-emergent condition) _16  - 0 New Admissions / Biomedical engineer / Ordering NPWT, Apligraf, etc. _17  - 0 Emergency Hospital Admission (emergent condition) X- 1 10 Simple Discharge Coordination _18  - 0 Complex (extensive) Discharge Coordination PROCESS - Special Needs _19  - Pediatric / Minor Patient Management 0 _20  - 0 Isolation Patient Management _21  - 0 Hearing / Language / Visual special needs _22  - 0 Assessment of Community assistance (transportation, D/C planning, etc.) _23  - 0 Additional assistance / Altered mentation _24  - 0 Support Surface(s) Assessment (bed, cushion, seat, etc.) INTERVENTIONS - Wound Cleansing / Measurement [name] ([id number]) X- 1 5 Simple Wound Cleansing - one wound _25  - 0 Complex Wound Cleansing - multiple wounds X- 1 5 Wound Imaging (photographs - any number of  wounds) _26  - 0 Wound Tracing (instead of photographs) X- 1 5 Simple Wound Measurement - one wound _27  - 0 Complex Wound Measurement - multiple wounds INTERVENTIONS - Wound  Dressings X - Small Wound Dressing one or multiple wounds 1 10 _0  - 0 Medium Wound Dressing one or multiple wounds _1  - 0 Large Wound Dressing one or multiple wounds <UOHFGBMSXJDBZMCE>_0<\/EMVVKPQAESLPNPYY>_5  - 0 Application of Medications - topical <RTMYTRZNBVAPOLID>_0<\/VUDTHYHOOILNZVJK>_8  - 0 Application of Medications - injection INTERVENTIONS - Miscellaneous _4  - External ear exam 0 _5  - 0 Specimen Collection (cultures, biopsies, blood, body fluids, etc.) _6  - 0 Specimen(s) / Culture(s) sent or taken to Lab for analysis _7  - 0 Patient Transfer (multiple staff / Harrel Lemon Lift / Similar devices) _8  - 0 Simple Staple / Suture removal (25 or less) _9  - 0 Complex Staple / Suture removal (26 or more) _10  - 0 Hypo / Hyperglycemic Management (close monitor of Blood Glucose) _11  - 0 Ankle / Brachial Index (ABI) - do not check if billed separately X- 1 5 Vital Signs Has the patient been seen at the hospital within the last three years: Yes Total Score: 60 Level Of Care: New/Established - Level 2 Electronic Signature(s) Signed: 10/16/2021 3:51:14 PM By: [name] Entered By: [name] on 10/16/2021 12:05:23 [name] ([id number]) -------------------------------------------------------------------------------- Encounter Discharge Information Details Patient Name: [name] Date of Service: 10/16/2021 11:30 AM Medical Record Number: [id number] Patient Account Number: [id number] Date of Birth/Sex: 1956/01/30 (65 y.o. M) Treating RN: [name] Primary Care Kenlei Safi: [name] Other Clinician: Referring Jaslen Adcox: [name] Treating Kahil Agner/Extender: [name] in Treatment: 7 Encounter Discharge Information Items Discharge Condition: Stable Ambulatory Status: Ambulatory Discharge Destination: Home Transportation: Private Auto Accompanied By: self Schedule  Follow-up Appointment: Yes Clinical Summary of Care: Electronic Signature(s) Signed: 10/16/2021 3:51:14 PM By: [name] Entered By: [name] on 10/16/2021 12:07:32 [name] ([id number]) -------------------------------------------------------------------------------- Lower Extremity Assessment Details Patient Name: [name] Date of Service: 10/16/2021 11:30 AM Medical Record Number: [id number] Patient Account Number: [id number] Date of Birth/Sex: [birth year] (65 y.o. M) Treating RN: [name] Primary Care Tammye Kahler: [name] Other Clinician: Referring Aaralynn Shepheard: [name] Treating Zelpha Messing/Extender: Jeri Cos Weeks in Treatment: 7 Electronic Signature(s) Signed: 10/16/2021 3:51:14 PM By: [name] Entered By: [name] on 10/16/2021 11:29:39 Demarcus, [name] ([id number]) -------------------------------------------------------------------------------- Multi Wound Chart Details Patient Name: [name] Date of Service: 10/16/2021 11:30 AM Medical Record Number: [id number] Patient Account Number: [id number] Date of Birth/Sex: 01/30/1956 (65 y.o. M) Treating RN: [name] Primary Care Verl Whitmore: [name] Other Clinician: Referring Lanah Steines: [name] Treating Eduin Friedel/Extender: [name] in Treatment: 7 Vital Signs Height(in): 41 Pulse(bpm): 33 Weight(lbs): 312 Blood Pressure(mmHg): 126/81 Body Mass Index(BMI): 43.5 Temperature(F): 97.9 Respiratory Rate(breaths/min): 16 Photos: [N/A:N/A] Wound Location: Left Chest N/A N/A Wounding Event: Trauma N/A N/A Primary Etiology: Trauma, Other N/A N/A Comorbid History: Coronary Artery Disease, N/A N/A Hypertension Date Acquired: 07/18/2021 N/A N/A Weeks of Treatment: 7 N/A N/A Wound Status: Open N/A N/A Wound Recurrence: No N/A N/A Measurements L x W x D (cm) 1.9x0.7x0.1 N/A N/A Area (cm) : 1.045 N/A N/A Volume (cm) : 0.104 N/A N/A % Reduction in Area: 70.80% N/A N/A % Reduction in  Volume: 70.90% N/A N/A Classification: Full Thickness Without Exposed N/A N/A Support Structures Exudate Amount: Medium N/A N/A Exudate Type: Serosanguineous N/A N/A Exudate Color: red, brown N/A N/A Granulation Amount: Medium (34-66%) N/A N/A Granulation Quality: Pink, Pale N/A N/A Necrotic Amount: Small (1-33%) N/A N/A Exposed Structures: Fat Layer (Subcutaneous Tissue): N/A N/A Yes Fascia: No Tendon: No Muscle: No Joint: No Bone: No Epithelialization: Large (67-100%) N/A N/A Treatment Notes Electronic Signature(s) Signed: 10/16/2021 3:51:14 PM By: [name] Entered  ByDonnamarie [name] on 10/16/2021 11:30:05 [name] ([id number]) -------------------------------------------------------------------------------- Multi-Disciplinary Care Plan Details Patient Name: [name], [name] Date of Service: 10/16/2021 11:30 AM Medical Record Number: [id number] Patient Account Number: [id number] Date of Birth/Sex: 04/04/1956 (66 y.o. M) Treating RN: [name] Primary Care Jadore Veals: [name] Other Clinician: Referring Anet Logsdon: [name] Treating Kaiyu Mirabal/Extender: [name] in Treatment: 7 Active Inactive Wound/Skin Impairment Nursing Diagnoses: Knowledge deficit related to ulceration/compromised skin integrity Goals: Patient/caregiver will verbalize understanding of skin care regimen Date Initiated: 08/28/2021 Date Inactivated: 10/16/2021 Target Resolution Date: 10/26/2021 Goal Status: Met Ulcer/skin breakdown will have a volume reduction of 30% by week 4 Date Initiated: 08/28/2021 Date Inactivated: 10/02/2021 Target Resolution Date: 09/28/2021 Goal Status: Unmet Unmet Reason: scar tissue Ulcer/skin breakdown will have a volume reduction of 50% by week 8 Date Initiated: 08/28/2021 Target Resolution Date: 10/26/2021 Goal Status: Active Ulcer/skin breakdown will have a volume reduction of 80% by week 12 Date Initiated: 08/28/2021 Target Resolution Date: 11/26/2021 Goal Status:  Active Ulcer/skin breakdown will heal within 14 weeks Date Initiated: 08/28/2021 Target Resolution Date: 12/26/2021 Goal Status: Active Interventions: Assess patient/caregiver ability to obtain necessary supplies Assess patient/caregiver ability to perform ulcer/skin care regimen upon admission and as needed Assess ulceration(s) every visit Notes: Electronic Signature(s) Signed: 10/16/2021 3:51:14 PM By: [name] Entered By: [name] on 10/16/2021 11:29:55 Schwimmer, [name] ([id number]) -------------------------------------------------------------------------------- Pain Assessment Details Patient Name: [name] Date of Service: 10/16/2021 11:30 AM Medical Record Number: [id number] Patient Account Number: [id number] Date of Birth/Sex: 05-01-1956 (66 y.o. M) Treating RN: [name] Primary Care Aliviya Schoeller: [name] Other Clinician: Referring Silvie Obremski: [name] Treating Yina Riviere/Extender: [name] in Treatment: 7 Active Problems Location of Pain Severity and Description of Pain Patient Has Paino No Site Locations Rate the pain. Current Pain Level: 0 Pain Management and Medication Current Pain Management: Electronic Signature(s) Signed: 10/16/2021 3:51:14 PM By: [name] Entered By: [name] on 10/16/2021 11:26:21 [name] ([id number]) -------------------------------------------------------------------------------- Patient/Caregiver Education Details Patient Name: [name] Date of Service: 10/16/2021 11:30 AM Medical Record Number: [id number] Patient Account Number: [id number] Date of Birth/Gender: 1956/01/18 (65 y.o. M) Treating RN: [name] Primary Care Physician: [name] Other Clinician: Referring Physician: Waunita [name] Treating Physician/Extender: [name] in Treatment: 7 Education Assessment Education Provided To: Patient Education Topics Provided Basic Hygiene: Wound/Skin Impairment: Electronic  Signature(s) Signed: 10/16/2021 3:51:14 PM By: [name] Entered By: [name] on 10/16/2021 12:05:37 Brister, [name] ([id number]) -------------------------------------------------------------------------------- Wound Assessment Details Patient Name: [name] Date of Service: 10/16/2021 11:30 AM Medical Record Number: [id number] Patient Account Number: [id number] Date of Birth/Sex: 08/22/1956 (65 y.o. M) Treating RN: [name] Primary Care Brook Geraci: [name] Other Clinician: Referring Jalaiyah Throgmorton: [name] Treating Yassen Kinnett/Extender: Jeri Cos Weeks in Treatment: 7 Wound Status Wound Number: 1 Primary Etiology: Trauma, Other Wound Location: Left Chest Wound Status: Open Wounding Event: Trauma Comorbid History: Coronary Artery Disease, Hypertension Date Acquired: 07/18/2021 Weeks Of Treatment: 7 Clustered Wound: No Photos Wound Measurements Length: (cm) 1.9 Width: (cm) 0.7 Depth: (cm) 0.1 Area: (cm) 1.045 Volume: (cm) 0.104 % Reduction in Area: 70.8% % Reduction in Volume: 70.9% Epithelialization: Large (67-100%) Tunneling: No Undermining: No Wound Description Classification: Full Thickness Without Exposed Support Structures Exudate Amount: Medium Exudate Type: Serosanguineous Exudate Color: red, brown Foul Odor After Cleansing: No Slough/Fibrino Yes Wound Bed Granulation Amount: Medium (34-66%) Exposed Structure Granulation Quality: Pink, Pale Fascia Exposed: No Necrotic Amount: Small (1-33%) Fat Layer (Subcutaneous Tissue) Exposed: Yes Necrotic Quality: Adherent Slough Tendon Exposed: No Muscle  Exposed: No Joint Exposed: No Bone Exposed: No Treatment Notes Wound #1 (Chest) Wound Laterality: Left Cleanser Soap and Water Discharge Instruction: Gently cleanse wound with antibacterial soap, rinse and pat dry prior to dressing wounds Peri-Wound Care [place], [name] ([id number]) Topical eucerin Discharge Instruction: apply to  periwound Primary Dressing Xeroform 4x4-HBD (in/in) Discharge Instruction: Apply Xeroform 4x4-HBD (in/in) as directed Secondary Dressing Coverlet Latex-Free Fabric Adhesive Dressings Discharge Instruction: 1.5 x 2 Secured With Compression Wrap Compression Stockings Add-Ons Electronic Signature(s) Signed: 10/16/2021 3:51:14 PM By: [name] Entered By: [name] on 10/16/2021 11:29:23 Lienemann, [name] ([id number]) -------------------------------------------------------------------------------- Catarina Details Patient Name: [name] Date of Service: 10/16/2021 11:30 AM Medical Record Number: [id number] Patient Account Number: [id number] Date of Birth/Sex: 1956/04/25 (66 y.o. M) Treating RN: [name] Primary Care Jenella Craigie: [name] Other Clinician: Referring Jaekwon Mcclune: [name] Treating Ryoma Nofziger/Extender: [name] in Treatment: 7 Vital Signs Time Taken: 11:24 Temperature (F): 97.9 Height (in): 71 Pulse (bpm): 64 Weight (lbs): 312 Respiratory Rate (breaths/min): 16 Body Mass Index (BMI): 43.5 Blood Pressure (mmHg): 126/81 Reference Range: 80 - 120 mg / dl Electronic Signature(s) Signed: 10/16/2021 3:51:14 PM By: [name] Entered ByDonnamarie [name] on 10/16/2021 11:26:09

## 2021-10-17 ENCOUNTER — Encounter: Payer: Self-pay | Admitting: Cardiology

## 2021-10-17 DIAGNOSIS — H15102 Unspecified episcleritis, left eye: Secondary | ICD-10-CM | POA: Diagnosis not present

## 2021-10-17 DIAGNOSIS — M1712 Unilateral primary osteoarthritis, left knee: Secondary | ICD-10-CM | POA: Diagnosis not present

## 2021-10-17 DIAGNOSIS — M1711 Unilateral primary osteoarthritis, right knee: Secondary | ICD-10-CM | POA: Diagnosis not present

## 2021-10-17 DIAGNOSIS — M170 Bilateral primary osteoarthritis of knee: Secondary | ICD-10-CM | POA: Diagnosis not present

## 2021-10-23 ENCOUNTER — Ambulatory Visit: Payer: Medicare HMO | Admitting: Physician Assistant

## 2021-10-28 ENCOUNTER — Other Ambulatory Visit: Payer: Self-pay | Admitting: Cardiology

## 2021-10-29 DIAGNOSIS — H15102 Unspecified episcleritis, left eye: Secondary | ICD-10-CM | POA: Diagnosis not present

## 2021-11-01 ENCOUNTER — Encounter: Payer: Self-pay | Admitting: Family

## 2021-11-01 ENCOUNTER — Telehealth (INDEPENDENT_AMBULATORY_CARE_PROVIDER_SITE_OTHER): Payer: Medicare HMO | Admitting: Family

## 2021-11-01 ENCOUNTER — Other Ambulatory Visit: Payer: Self-pay

## 2021-11-01 VITALS — Temp 96.4°F | Ht 71.0 in | Wt 310.0 lb

## 2021-11-01 DIAGNOSIS — J069 Acute upper respiratory infection, unspecified: Secondary | ICD-10-CM

## 2021-11-01 MED ORDER — AZITHROMYCIN 250 MG PO TABS: ORAL_TABLET | ORAL | 0 refills | Status: AC

## 2021-11-01 NOTE — Assessment & Plan Note (Signed)
Suspected viral however pt persistent that he needs antbx and this happens ever year, d/w pt that this is likely viral and will run its course however pt still persists. Sent in zpack. Advised pt if any worsening sob/doe needs to be evaluated immediately if mod to severe to the er.  ? ?Unable to r/o any heart failure related qualities over video visit however advised pt of s/s to look out for, pt also states f/u with his cardiologist regularly.  ?
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
## 2021-11-06 ENCOUNTER — Ambulatory Visit: Payer: Medicare HMO | Admitting: Internal Medicine
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
## 2021-11-15 ENCOUNTER — Encounter: Payer: Self-pay | Admitting: Family Medicine

## 2021-11-15 ENCOUNTER — Other Ambulatory Visit: Payer: Self-pay

## 2021-11-15 ENCOUNTER — Ambulatory Visit (INDEPENDENT_AMBULATORY_CARE_PROVIDER_SITE_OTHER): Payer: Medicare HMO | Admitting: Family Medicine

## 2021-11-15 VITALS — BP 122/80 | HR 58 | Temp 97.9°F | Ht 70.0 in | Wt 327.2 lb

## 2021-11-15 DIAGNOSIS — Z0000 Encounter for general adult medical examination without abnormal findings: Secondary | ICD-10-CM | POA: Diagnosis not present

## 2021-11-15 DIAGNOSIS — Z23 Encounter for immunization: Secondary | ICD-10-CM

## 2021-11-15 DIAGNOSIS — R0609 Other forms of dyspnea: Secondary | ICD-10-CM | POA: Diagnosis not present

## 2021-11-15 DIAGNOSIS — E039 Hypothyroidism, unspecified: Secondary | ICD-10-CM | POA: Diagnosis not present

## 2021-11-15 DIAGNOSIS — F3342 Major depressive disorder, recurrent, in full remission: Secondary | ICD-10-CM

## 2021-11-15 DIAGNOSIS — E782 Mixed hyperlipidemia: Secondary | ICD-10-CM

## 2021-11-15 DIAGNOSIS — I10 Essential (primary) hypertension: Secondary | ICD-10-CM | POA: Diagnosis not present

## 2021-11-15 DIAGNOSIS — G479 Sleep disorder, unspecified: Secondary | ICD-10-CM

## 2021-11-15 LAB — CBC WITH DIFFERENTIAL/PLATELET
Basophils Absolute: 0.1 10*3/uL (ref 0.0–0.1)
Basophils Relative: 0.9 % (ref 0.0–3.0)
Eosinophils Absolute: 0.1 10*3/uL (ref 0.0–0.7)
Eosinophils Relative: 1.6 % (ref 0.0–5.0)
HCT: 42.5 % (ref 39.0–52.0)
Hemoglobin: 14 g/dL (ref 13.0–17.0)
Lymphocytes Relative: 13.5 % (ref 12.0–46.0)
Lymphs Abs: 1 10*3/uL (ref 0.7–4.0)
MCHC: 32.9 g/dL (ref 30.0–36.0)
MCV: 87.6 fl (ref 78.0–100.0)
Monocytes Absolute: 0.6 10*3/uL (ref 0.1–1.0)
Monocytes Relative: 7.6 % (ref 3.0–12.0)
Neutro Abs: 5.8 10*3/uL (ref 1.4–7.7)
Neutrophils Relative %: 76.4 % (ref 43.0–77.0)
Platelets: 237 10*3/uL (ref 150.0–400.0)
RBC: 4.86 Mil/uL (ref 4.22–5.81)
RDW: 14 % (ref 11.5–15.5)
WBC: 7.6 10*3/uL (ref 4.0–10.5)

## 2021-11-15 LAB — LIPID PANEL
Cholesterol: 86 mg/dL (ref 0–200)
HDL: 29.4 mg/dL — ABNORMAL LOW (ref >39.00)
LDL Cholesterol: 42 mg/dL (ref 0–99)
NonHDL: 56.24
Total CHOL/HDL Ratio: 3
Triglycerides: 72 mg/dL (ref 0.0–149.0)
VLDL: 14.4 mg/dL (ref 0.0–40.0)

## 2021-11-15 LAB — COMPREHENSIVE METABOLIC PANEL
ALT: 22 U/L (ref 0–53)
AST: 18 U/L (ref 0–37)
Albumin: 4.1 g/dL (ref 3.5–5.2)
Alkaline Phosphatase: 87 U/L (ref 39–117)
BUN: 20 mg/dL (ref 6–23)
CO2: 26 mEq/L (ref 19–32)
Calcium: 8.8 mg/dL (ref 8.4–10.5)
Chloride: 104 mEq/L (ref 96–112)
Creatinine, Ser: 1.01 mg/dL (ref 0.40–1.50)
GFR: 78 mL/min (ref >60.00)
Glucose, Bld: 82 mg/dL (ref 70–99)
Potassium: 4.2 mEq/L (ref 3.5–5.1)
Sodium: 139 mEq/L (ref 135–145)
Total Bilirubin: 0.4 mg/dL (ref 0.2–1.2)
Total Protein: 7.2 g/dL (ref 6.0–8.3)

## 2021-11-15 LAB — TSH: TSH: 4.26 u[IU]/mL (ref 0.35–5.50)

## 2021-11-15 LAB — BRAIN NATRIURETIC PEPTIDE: Pro B Natriuretic peptide (BNP): 19 pg/mL (ref 0.0–100.0)

## 2021-11-15 MED ORDER — BUPROPION HCL ER (XL) 300 MG PO TB24: 300.0000 mg | ORAL_TABLET | Freq: Every morning | ORAL | 1 refills | Status: DC

## 2021-11-15 MED ORDER — GABAPENTIN 600 MG PO TABS: 600.0000 mg | ORAL_TABLET | Freq: Three times a day (TID) | ORAL | 0 refills | Status: DC

## 2021-11-15 MED ORDER — TRAZODONE HCL 100 MG PO TABS: 200.0000 mg | ORAL_TABLET | Freq: Every day | ORAL | 3 refills | Status: DC

## 2021-11-15 MED ORDER — LEVOTHYROXINE SODIUM 88 MCG PO TABS: 88.0000 ug | ORAL_TABLET | Freq: Every day | ORAL | 3 refills | Status: DC
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
## 2021-11-15 NOTE — Assessment & Plan Note (Signed)
EKG with sinus rhythm. Has Cardiology f/u and patient hoping to get stress test. BP normal today. Will check BNP though low concern at this time given no edema.  ?

## 2021-11-15 NOTE — Assessment & Plan Note (Signed)
Continue current medications cymbalta and wellbutrin. Start therapy. Return in 3 months ?

## 2021-11-15 NOTE — Patient Instructions (Addendum)
Gabapentin ?- Day 1: Start taking 300 mg afternoon ?- Day 4: Increase morning to 600 mg , 300 mg afternoon, then 600 mg at nighttime ?- Day 7: Increase to 600 mg three times daily ? ? ? ?Consider contacting a professional therapist  ?-- [name] is one option. Call [phone] ? ?Referral placed - number above to call and schedule therapy ? ? ? ? ? ?

## 2021-11-20 DIAGNOSIS — G4733 Obstructive sleep apnea (adult) (pediatric): Secondary | ICD-10-CM | POA: Diagnosis not present

## 2021-11-21 ENCOUNTER — Ambulatory Visit: Payer: Medicare HMO | Admitting: Cardiology
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
## 2021-11-23 ENCOUNTER — Ambulatory Visit: Payer: Medicare HMO | Admitting: Adult Health

## 2021-11-23 ENCOUNTER — Encounter: Payer: Self-pay | Admitting: Adult Health

## 2021-11-23 VITALS — BP 126/78 | HR 59 | Ht 70.0 in | Wt 330.0 lb

## 2021-11-23 DIAGNOSIS — E7800 Pure hypercholesterolemia, unspecified: Secondary | ICD-10-CM | POA: Diagnosis not present

## 2021-11-23 DIAGNOSIS — I25118 Atherosclerotic heart disease of native coronary artery with other forms of angina pectoris: Secondary | ICD-10-CM

## 2021-11-23 DIAGNOSIS — I2510 Atherosclerotic heart disease of native coronary artery without angina pectoris: Secondary | ICD-10-CM | POA: Diagnosis not present

## 2021-11-23 DIAGNOSIS — I10 Essential (primary) hypertension: Secondary | ICD-10-CM | POA: Diagnosis not present

## 2021-11-23 DIAGNOSIS — I208 Other forms of angina pectoris: Secondary | ICD-10-CM | POA: Diagnosis not present

## 2021-11-23 DIAGNOSIS — G4733 Obstructive sleep apnea (adult) (pediatric): Secondary | ICD-10-CM

## 2021-11-23 MED ORDER — NITROGLYCERIN 0.4 MG SL SUBL: 0.4000 mg | SUBLINGUAL_TABLET | SUBLINGUAL | 3 refills | Status: DC | PRN

## 2021-11-23 NOTE — Patient Instructions (Signed)
Medication Instructions:  ?No Changes ?*If you need a refill on your cardiac medications before your next appointment, please call your pharmacy* ? ? ?Lab Work: ?BMET, CBC ?If you have labs (blood work) drawn today and your tests are completely normal, you will receive your results only by: ?MyChart Message (if you have MyChart) OR ?A paper copy in the mail ?If you have any lab test that is abnormal or we need to change your treatment, we will call you to review the results. ? ? ?Testing/Procedures: ? ?[facility] ?[address] ?[place] [address] ?Dept: [phone] ?Loc: [phone] ? ?Rubin Dais  11/23/2021 ? ?You are scheduled for a Cardiac Catheterization on Wednesday, April 12 with Dr. [name]. ? ?1. Please arrive at the Main Entrance A at [facility]: [address], [place] [address] at 6:30 AM (This time is two hours before your procedure to ensure your preparation). Free valet parking service is available.  ? ?Special note: Every effort is made to have your procedure done on time. Please understand that emergencies sometimes delay scheduled procedures. ? ?2. Diet: Do not eat solid foods after midnight.  You may have clear liquids until 5 AM upon the day of the procedure. ? ?3. Labs: You will need to have blood drawn on Friday, March 31 at [place], [place]  ?Open: 8am - 5pm (Lunch 12:30 - 1:30)   Phone: [phone]. You do not need to be fasting. ? ?4. Medication instructions in preparation for your procedure: ? ? Contrast Allergy: No ? ? ?Current Outpatient Medications (Endocrine & Metabolic):  ?  levothyroxine (SYNTHROID) 88 MCG tablet, Take 1 tablet (88 mcg total) by mouth daily before breakfast. ? ? ?Current Outpatient Medications (Cardiovascular):  ?  amLODipine (NORVASC) 5 MG tablet, TAKE 1 TABLET EVERY DAY ?  atorvastatin (LIPITOR) 40 MG tablet, TAKE 1  TABLET EVERY DAY (NEED MD APPOINTMENT FOR REFILLS) ?  furosemide (LASIX) 20 MG tablet, TAKE 1 TABLET DAILY (NEED TO SCHEDULE AN IN OFFICE APPOINTMENT FOR FUTURE REFILLS) ?  metoprolol succinate (TOPROL-XL) 25 MG 24 hr tablet, TAKE 1 TABLET DAILY. PATIENT MUST SCHEDULE APPOINTMENT FOR FUTURE REFILLS. SECOND ATTEMPT ?  nitroGLYCERIN (NITROSTAT) 0.4 MG SL tablet, Place 1 tablet (0.4 mg total) under the tongue every 5 (five) minutes as needed for chest pain. ?  tadalafil (CIALIS) 20 MG tablet, Take 1 tablet (20 mg total) by mouth daily as needed for erectile dysfunction. ? ? ?Current Outpatient Medications (Respiratory):  ?  Dextromethorphan HBr (DELSYM PO), Take by mouth as needed. ?  mometasone (NASONEX) 50 MCG/ACT nasal spray, Place 2 sprays into the nose daily as needed (allergies). ? ? ?Current Outpatient Medications (Analgesics):  ?  aspirin EC 81 MG tablet, Take 1 tablet (81 mg total) by mouth daily. ? ? ? ? ?Current Outpatient Medications (Other):  ?  buPROPion (WELLBUTRIN XL) 300 MG 24 hr tablet, Take 1 tablet (300 mg total) by mouth every morning. ?  cyclobenzaprine (FLEXERIL) 10 MG tablet, 1 tablet ?  cycloSPORINE (RESTASIS) 0.05 % ophthalmic emulsion, Place 1 drop into the left eye daily.  ?  DULoxetine (CYMBALTA) 60 MG capsule, Take 1 capsule (60 mg total) by mouth every morning. ?  gabapentin (NEURONTIN) 600 MG tablet, Take 1 tablet (600 mg total) by mouth 3 (three) times daily. ?  Multiple Vitamins-Minerals (MULTI FOR HIM 50+) TABS,  ?  Omega-3 Fatty Acids (FISH OIL)  1000 MG CAPS, 2 capsules ?  Respiratory Therapy Supplies (CARETOUCH 2 CPAP HOSE HANGER) MISC, automatic setting ?  traZODone (DESYREL) 100 MG tablet, Take 2 tablets (200 mg total) by mouth at bedtime. ? ?Current Facility-Administered Medications (Other):  ?  sodium chloride flush (NS) 0.9 % injection 3 mL ?*For reference purposes while preparing patient instructions.   ?Delete this med list prior to printing instructions for  patient.* ? ? ? ? ? ? ?On the morning of your procedure, take Aspirin and any morning medicines NOT listed above.  You may use sips of water. ? ?5. Plan to go home the same day, you will only stay overnight if medically necessary. ?6. You MUST have a responsible adult to drive you home. ?7. An adult MUST be with you the first 24 hours after you arrive home. ?8. Bring a current list of your medications, and the last time and date medication taken. ?9. Bring ID and current insurance cards. ?10.Please wear clothes that are easy to get on and off and wear slip-on shoes. ? ?Thank you for allowing [place] to care for you! ?  -- [facility]  ? ? ?Follow-Up: ?At [facility], you and your health needs are our priority.  As part of our continuing mission to provide you with exceptional heart care, we have created designated Provider Care Teams.  These Care Teams include your primary Cardiologist (physician) and Advanced Practice Providers (APPs -  Physician Assistants and Nurse Practitioners) who all work together to provide you with the care you need, when you need it. ? ?We recommend signing up for the patient portal called "MyChart".  Sign up information is provided on this After Visit Summary.  MyChart is used to connect with patients for Virtual Visits (Telemedicine).  Patients are able to view lab/test results, encounter notes, upcoming appointments, etc.  Non-urgent messages can be sent to your provider as well.   ?To learn more about what you can do with MyChart, go to [email or web address].   ? ?Your next appointment:   ?2 week(s) Post Catherization ? ?The format for your next appointment:   ?In Person ? ?Provider:   ?Bunnie Domino, FNP,DNP ? ?1}  ? ?  ?

## 2021-11-24 ENCOUNTER — Other Ambulatory Visit: Payer: Self-pay | Admitting: Adult Health

## 2021-11-24 LAB — BASIC METABOLIC PANEL
BUN/Creatinine Ratio: 16 (ref 10–24)
BUN: 18 mg/dL (ref 8–27)
CO2: 20 mmol/L (ref 20–29)
Calcium: 8.5 mg/dL — ABNORMAL LOW (ref 8.6–10.2)
Chloride: 104 mmol/L (ref 96–106)
Creatinine, Ser: 1.16 mg/dL (ref 0.76–1.27)
Glucose: 84 mg/dL (ref 70–99)
Potassium: 4.4 mmol/L (ref 3.5–5.2)
Sodium: 143 mmol/L (ref 134–144)
eGFR: 70 mL/min/{1.73_m2} (ref >59)

## 2021-11-24 LAB — CBC
Hematocrit: 43.9 % (ref 37.5–51.0)
Hemoglobin: 14.5 g/dL (ref 13.0–17.7)
MCH: 28.8 pg (ref 26.6–33.0)
MCHC: 33 g/dL (ref 31.5–35.7)
MCV: 87 fL (ref 79–97)
Platelets: 239 10*3/uL (ref 150–450)
RBC: 5.04 x10E6/uL (ref 4.14–5.80)
RDW: 13.4 % (ref 11.6–15.4)
WBC: 6.7 10*3/uL (ref 3.4–10.8)

## 2021-11-28 ENCOUNTER — Telehealth: Payer: Self-pay

## 2021-11-28 NOTE — Telephone Encounter (Addendum)
Called patient regarding results. Patient had understanding of results.----- Message from Lendon Colonel, NP sent at 11/24/2021 12:51 PM EDT ----- ?I have reviewed his labs.  He is good to go for the cardiac cath.  ?

## 2021-12-03 ENCOUNTER — Telehealth: Payer: Self-pay | Admitting: Urology

## 2021-12-03 NOTE — Telephone Encounter (Signed)
he needs to have his heart cath completed and the okay from cardiology before we can even consider restarting his testosterone therapy, so no labs.   ?

## 2021-12-04 ENCOUNTER — Telehealth: Payer: Self-pay | Admitting: Urology

## 2021-12-04 NOTE — Telephone Encounter (Signed)
Pt called to let us know he d/c his testosterone in January because it was not working.  He said it was a trial basis for the patch.  He thought he needs a follow up from [facility]P procedure. ?

## 2021-12-05 ENCOUNTER — Encounter (HOSPITAL_COMMUNITY)
Admission: RE | Disposition: A | Discharge status: Home / Self Care | Payer: Self-pay | Source: Home / Self Care | Attending: Cardiovascular Disease

## 2021-12-05 ENCOUNTER — Other Ambulatory Visit: Payer: Self-pay

## 2021-12-05 ENCOUNTER — Ambulatory Visit (HOSPITAL_COMMUNITY)
Admission: RE | Admit: 2021-12-05 | Discharge: 2021-12-05 | Disposition: A | Discharge status: Home / Self Care | Payer: Medicare HMO | Attending: Cardiovascular Disease | Admitting: Cardiovascular Disease

## 2021-12-05 DIAGNOSIS — I209 Angina pectoris, unspecified: Secondary | ICD-10-CM | POA: Diagnosis present

## 2021-12-05 DIAGNOSIS — Z87891 Personal history of nicotine dependence: Secondary | ICD-10-CM | POA: Diagnosis not present

## 2021-12-05 DIAGNOSIS — Z79899 Other long term (current) drug therapy: Secondary | ICD-10-CM | POA: Diagnosis not present

## 2021-12-05 DIAGNOSIS — K219 Gastro-esophageal reflux disease without esophagitis: Secondary | ICD-10-CM | POA: Diagnosis not present

## 2021-12-05 DIAGNOSIS — E039 Hypothyroidism, unspecified: Secondary | ICD-10-CM | POA: Diagnosis not present

## 2021-12-05 DIAGNOSIS — G4733 Obstructive sleep apnea (adult) (pediatric): Secondary | ICD-10-CM | POA: Diagnosis not present

## 2021-12-05 DIAGNOSIS — I25118 Atherosclerotic heart disease of native coronary artery with other forms of angina pectoris: Secondary | ICD-10-CM | POA: Diagnosis present

## 2021-12-05 DIAGNOSIS — I25119 Atherosclerotic heart disease of native coronary artery with unspecified angina pectoris: Secondary | ICD-10-CM | POA: Insufficient documentation

## 2021-12-05 DIAGNOSIS — F419 Anxiety disorder, unspecified: Secondary | ICD-10-CM | POA: Insufficient documentation

## 2021-12-05 DIAGNOSIS — Z6841 Body Mass Index (BMI) 40.0 and over, adult: Secondary | ICD-10-CM | POA: Diagnosis not present

## 2021-12-05 DIAGNOSIS — I10 Essential (primary) hypertension: Secondary | ICD-10-CM | POA: Diagnosis not present

## 2021-12-05 HISTORY — PX: LEFT HEART CATH AND CORONARY ANGIOGRAPHY: CATH118249

## 2021-12-05 SURGERY — LEFT HEART CATH AND CORONARY ANGIOGRAPHY
Anesthesia: LOCAL

## 2021-12-05 MED ORDER — HEPARIN (PORCINE) IN NACL 1000-0.9 UT/500ML-% IV SOLN
INTRAVENOUS | Status: AC
  Filled 2021-12-05: qty 1500

## 2021-12-05 MED ORDER — ACETAMINOPHEN 325 MG PO TABS: 650.0000 mg | ORAL_TABLET | ORAL | Status: DC | PRN

## 2021-12-05 MED ORDER — VERAPAMIL HCL 2.5 MG/ML IV SOLN
INTRAVENOUS | Status: AC
  Filled 2021-12-05: qty 2

## 2021-12-05 MED ORDER — FENTANYL CITRATE (PF) 100 MCG/2ML IJ SOLN
INTRAMUSCULAR | Status: DC | PRN
  Administered 2021-12-05: 25 ug via INTRAVENOUS

## 2021-12-05 MED ORDER — ASPIRIN 81 MG PO CHEW: 81.0000 mg | CHEWABLE_TABLET | ORAL | Status: AC

## 2021-12-05 MED ORDER — LIDOCAINE HCL (PF) 1 % IJ SOLN
INTRAMUSCULAR | Status: DC | PRN
  Administered 2021-12-05: 2 mL

## 2021-12-05 MED ORDER — FENTANYL CITRATE (PF) 100 MCG/2ML IJ SOLN
INTRAMUSCULAR | Status: AC
  Filled 2021-12-05: qty 2

## 2021-12-05 MED ORDER — SODIUM CHLORIDE 0.9% FLUSH: 3.0000 mL | INTRAVENOUS | Status: DC | PRN

## 2021-12-05 MED ORDER — SODIUM CHLORIDE 0.9% FLUSH: 3.0000 mL | Freq: Two times a day (BID) | INTRAVENOUS | Status: DC

## 2021-12-05 MED ORDER — SODIUM CHLORIDE 0.9 % WEIGHT BASED INFUSION
1.0000 mL/kg/h | INTRAVENOUS | Status: DC
  Administered 2021-12-05: 1 mL/kg/h via INTRAVENOUS

## 2021-12-05 MED ORDER — SODIUM CHLORIDE 0.9 % IV SOLN: 250.0000 mL | INTRAVENOUS | Status: DC | PRN

## 2021-12-05 MED ORDER — MIDAZOLAM HCL 2 MG/2ML IJ SOLN
INTRAMUSCULAR | Status: DC | PRN
  Administered 2021-12-05: 1 mg via INTRAVENOUS

## 2021-12-05 MED ORDER — SODIUM CHLORIDE 0.9 % IV SOLN: INTRAVENOUS | Status: DC

## 2021-12-05 MED ORDER — HYDRALAZINE HCL 20 MG/ML IJ SOLN: 10.0000 mg | INTRAMUSCULAR | Status: DC | PRN

## 2021-12-05 MED ORDER — LIDOCAINE HCL (PF) 1 % IJ SOLN
INTRAMUSCULAR | Status: AC
  Filled 2021-12-05: qty 30

## 2021-12-05 MED ORDER — VERAPAMIL HCL 2.5 MG/ML IV SOLN
INTRAVENOUS | Status: DC | PRN
  Administered 2021-12-05: 10 mL via INTRA_ARTERIAL

## 2021-12-05 MED ORDER — MIDAZOLAM HCL 2 MG/2ML IJ SOLN
INTRAMUSCULAR | Status: AC
  Filled 2021-12-05: qty 2

## 2021-12-05 MED ORDER — ONDANSETRON HCL 4 MG/2ML IJ SOLN: 4.0000 mg | Freq: Four times a day (QID) | INTRAMUSCULAR | Status: DC | PRN

## 2021-12-05 MED ORDER — HEPARIN SODIUM (PORCINE) 1000 UNIT/ML IJ SOLN
INTRAMUSCULAR | Status: DC | PRN
  Administered 2021-12-05: 6000 [IU] via INTRAVENOUS

## 2021-12-05 MED ORDER — HEPARIN (PORCINE) IN NACL 1000-0.9 UT/500ML-% IV SOLN
INTRAVENOUS | Status: DC | PRN
  Administered 2021-12-05 (×3): 500 mL

## 2021-12-05 MED ORDER — HEPARIN SODIUM (PORCINE) 1000 UNIT/ML IJ SOLN
INTRAMUSCULAR | Status: AC
  Filled 2021-12-05: qty 10

## 2021-12-05 MED ORDER — IOHEXOL 350 MG/ML SOLN
INTRAVENOUS | Status: DC | PRN
  Administered 2021-12-05: 55 mL

## 2021-12-05 MED ORDER — LABETALOL HCL 5 MG/ML IV SOLN: 10.0000 mg | INTRAVENOUS | Status: DC | PRN

## 2021-12-05 MED ORDER — SODIUM CHLORIDE 0.9 % WEIGHT BASED INFUSION
3.0000 mL/kg/h | INTRAVENOUS | Status: AC
  Administered 2021-12-05: 3 mL/kg/h via INTRAVENOUS

## 2021-12-05 SURGICAL SUPPLY — 11 items
CATH 5FR JR4 DIAGNOSTIC (CATHETERS) ×1 IMPLANT
CATH VISTA GUIDE 6FR XBLAD3.5 (CATHETERS) ×1 IMPLANT
DEVICE RAD COMP TR BAND LRG (VASCULAR PRODUCTS) ×1 IMPLANT
GLIDESHEATH SLEND SS 6F .021 (SHEATH) ×1 IMPLANT
GUIDEWIRE INQWIRE 1.5J.035X260 (WIRE) IMPLANT
INQWIRE 1.5J .035X260CM (WIRE) ×2
KIT HEART LEFT (KITS) ×2 IMPLANT
PACK CARDIAC CATHETERIZATION (CUSTOM PROCEDURE TRAY) ×2 IMPLANT
SYR MEDRAD MARK 7 150ML (SYRINGE) ×2 IMPLANT
TRANSDUCER W/STOPCOCK (MISCELLANEOUS) ×2 IMPLANT
TUBING CIL FLEX 10 FLL-RA (TUBING) ×2 IMPLANT

## 2021-12-05 NOTE — Discharge Instructions (Signed)

## 2021-12-05 NOTE — Interval H&P Note (Signed)
History and Physical Interval Note: ? ?12/05/2021 ?7:36 AM ? ?[name]  has presented today for surgery, with the diagnosis of cad.  The various methods of treatment have been discussed with the patient and family. After consideration of risks, benefits and other options for treatment, the patient has consented to  Procedure(s): ?LEFT HEART CATH AND CORONARY ANGIOGRAPHY (N/A) as a surgical intervention.  The patient's history has been reviewed, patient examined, no change in status, stable for surgery.  I have reviewed the patient's chart and labs.  Questions were answered to the patient's satisfaction.   ? ? ?[name] ? ? ?

## 2021-12-05 NOTE — Progress Notes (Signed)
Patient and wife was given discharge instructions. Both verbalized understanding. 

## 2021-12-06 ENCOUNTER — Other Ambulatory Visit: Payer: Self-pay

## 2021-12-06 ENCOUNTER — Encounter (HOSPITAL_COMMUNITY): Payer: Self-pay | Admitting: Cardiovascular Disease

## 2021-12-06 DIAGNOSIS — N401 Enlarged prostate with lower urinary tract symptoms: Secondary | ICD-10-CM

## 2021-12-11 ENCOUNTER — Other Ambulatory Visit: Payer: Medicare HMO

## 2021-12-12 ENCOUNTER — Other Ambulatory Visit: Payer: Self-pay

## 2021-12-17 ENCOUNTER — Other Ambulatory Visit: Payer: Self-pay

## 2021-12-17 DIAGNOSIS — N401 Enlarged prostate with lower urinary tract symptoms: Secondary | ICD-10-CM

## 2021-12-17 DIAGNOSIS — E349 Endocrine disorder, unspecified: Secondary | ICD-10-CM

## 2021-12-17 NOTE — Progress Notes (Signed)
error 

## 2021-12-17 NOTE — Addendum Note (Signed)
Addended by: Tommy Rainwater on: 12/17/2021 03:59 PM ? ? Modules accepted: Orders ? ?

## 2021-12-18 ENCOUNTER — Other Ambulatory Visit: Payer: Medicare HMO

## 2021-12-18 DIAGNOSIS — N401 Enlarged prostate with lower urinary tract symptoms: Secondary | ICD-10-CM | POA: Diagnosis not present

## 2021-12-18 DIAGNOSIS — E349 Endocrine disorder, unspecified: Secondary | ICD-10-CM

## 2021-12-19 LAB — PSA: Prostate Specific Ag, Serum: 0.4 ng/mL (ref 0.0–4.0)

## 2021-12-19 LAB — TESTOSTERONE: Testosterone: 211 ng/dL — ABNORMAL LOW (ref 264–916)
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
## 2021-12-21 ENCOUNTER — Encounter: Payer: Self-pay | Admitting: Adult Health

## 2021-12-21 ENCOUNTER — Ambulatory Visit: Payer: Medicare HMO | Admitting: Adult Health

## 2021-12-21 ENCOUNTER — Other Ambulatory Visit: Payer: Self-pay

## 2021-12-21 VITALS — BP 110/82 | HR 59 | Ht 71.0 in | Wt 329.3 lb

## 2021-12-21 DIAGNOSIS — E782 Mixed hyperlipidemia: Secondary | ICD-10-CM

## 2021-12-21 DIAGNOSIS — I2510 Atherosclerotic heart disease of native coronary artery without angina pectoris: Secondary | ICD-10-CM | POA: Diagnosis not present

## 2021-12-21 DIAGNOSIS — R0602 Shortness of breath: Secondary | ICD-10-CM | POA: Diagnosis not present

## 2021-12-21 DIAGNOSIS — I10 Essential (primary) hypertension: Secondary | ICD-10-CM

## 2021-12-21 MED ORDER — ISOSORBIDE DINITRATE 30 MG PO TABS: 30.0000 mg | ORAL_TABLET | Freq: Two times a day (BID) | ORAL | 3 refills | Status: DC

## 2021-12-21 MED ORDER — ISOSORBIDE DINITRATE 20 MG PO TABS: 20.0000 mg | ORAL_TABLET | Freq: Two times a day (BID) | ORAL | 3 refills | Status: DC

## 2021-12-21 MED ORDER — ISOSORBIDE DINITRATE 30 MG PO TABS: 30.0000 mg | ORAL_TABLET | Freq: Two times a day (BID) | ORAL | 0 refills | Status: DC

## 2021-12-21 MED ORDER — ISOSORBIDE DINITRATE 20 MG PO TABS: 20.0000 mg | ORAL_TABLET | Freq: Two times a day (BID) | ORAL | 0 refills | Status: DC

## 2021-12-21 NOTE — Telephone Encounter (Signed)
Sent correct dose amount to patient's mail order pharmacy  ?

## 2021-12-21 NOTE — Patient Instructions (Signed)
Medication Instructions:  ?START Isosorbide Dinitrate 20 mg 2 times a day. DO NOT take on the same day that you take your Cialis (Tadalafil)  ?*If you need a refill on your cardiac medications before your next appointment, please call your pharmacy* ? ?Lab Work: ?NONE ordered at this time of appointment  ? ?If you have labs (blood work) drawn today and your tests are completely normal, you will receive your results only by: ?MyChart Message (if you have MyChart) OR ?A paper copy in the mail ?If you have any lab test that is abnormal or we need to change your treatment, we will call you to review the results. ? ?Testing/Procedures: ?You have been referred to Pulmonology  ? ?Follow-Up: ?At [facility], you and your health needs are our priority.  As part of our continuing mission to provide you with exceptional heart care, we have created designated Provider Care Teams.  These Care Teams include your primary Cardiologist (physician) and Advanced Practice Providers (APPs -  Physician Assistants and Nurse Practitioners) who all work together to provide you with the care you need, when you need it. ?   ? ?Your next appointment:   ?6 month(s) ? ?The format for your next appointment:   ?In Person ? ?Provider:   ?[name], MD   ? ? ?Other Instructions ? ? ?Important Information About Sugar ? ? ? ? ? ? ?

## 2021-12-21 NOTE — Addendum Note (Signed)
Addended by: Jacqulynn Cadet on: 12/21/2021 03:53 PM ? ? Modules accepted: Orders ? ?

## 2021-12-24 ENCOUNTER — Other Ambulatory Visit: Payer: Self-pay

## 2021-12-24 MED ORDER — ISOSORBIDE DINITRATE 30 MG PO TABS: 30.0000 mg | ORAL_TABLET | Freq: Two times a day (BID) | ORAL | 3 refills | Status: DC
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
## 2021-12-26 ENCOUNTER — Ambulatory Visit: Payer: Medicare HMO | Admitting: Urology

## 2021-12-26 ENCOUNTER — Encounter: Payer: Self-pay | Admitting: Urology

## 2021-12-26 VITALS — BP 123/77 | HR 73 | Ht 71.0 in | Wt 322.0 lb

## 2021-12-26 DIAGNOSIS — E349 Endocrine disorder, unspecified: Secondary | ICD-10-CM

## 2021-12-26 DIAGNOSIS — N529 Male erectile dysfunction, unspecified: Secondary | ICD-10-CM | POA: Diagnosis not present

## 2021-12-26 DIAGNOSIS — N401 Enlarged prostate with lower urinary tract symptoms: Secondary | ICD-10-CM | POA: Diagnosis not present

## 2021-12-26 DIAGNOSIS — R319 Hematuria, unspecified: Secondary | ICD-10-CM

## 2021-12-26 DIAGNOSIS — N3941 Urge incontinence: Secondary | ICD-10-CM | POA: Diagnosis not present

## 2021-12-26 LAB — BLADDER SCAN AMB NON-IMAGING

## 2021-12-26 MED ORDER — AMBULATORY NON FORMULARY MEDICATION: 0 refills | Status: DC

## 2021-12-26 NOTE — Patient Instructions (Signed)
Contact cardiology to see if you can return to using the patches of testosterone medications ? ?Discuss pelvic floor physical therapy (Kegel's)  ? ?Discuss using injections to achieve erections  ?

## 2021-12-31 NOTE — Progress Notes (Signed)
? ?01/01/2022 ?8:21 AM ? ?[name] ?1956-02-18 ?[id number] ? ? ?Referring provider: [name], MD ?[address],  [place] [address] ?Urological history  ?1. High risk hematuria ?- Former smoker ?- CTU 01/2020 normal adrenal glands. 3 mm lower pole left renal collecting system calculus. No right-sided renal calculi. No hydroureter or ureteric calculi. 2 left-sided bladder stones dependently, including on 64/2. Maximally 3 mm.  2.7 cm left renal lesion is most consistent with a minimally complex cyst, including on 42/9. Too small to characterize interpolar right  renal lesion. No suspicious renal mass. Moderate to good renal collecting system opacification on delayed images. Good ureteric opacification, without filling defect.  There is a moderate amount of non-opacified urine within the nondependent bladder on delayed images. Given this factor, no enhancing bladder mass or bladder filling defect. Mild median lobe prostatic impression into the urinary bladder.  Mild prostatomegaly ?-Cystoscopy with Dr. Diamantina Providence 01/2020 Small bladder stone, very large prostate with high bladder neck (100 g)  ?-Urine cytology negative 2021 ?-no reports of gross heme ?  ?2. BPH ?-PSA 0.4, 11/2021 ?-s/p HoLEP with Dr. Diamantina Providence 02/2020- prostate chips negative  ?-I PSS 5/2 ?-PVR 3 mL  ?  ?3. Urethral stricture ?- cysto 03/2020 Fossa navicularis stricture - self dilated for a few months ?  ?4. ED ?-contributing factors of age, BPH, testosterone deficiency, HTN, HLD, hypothyroidism, sleep apnea, anxiety and depression ?-moderate response with PDE5i's ?-SHIM 9 ?  ?5. Testosterone deficiency ?-contributing factors of age ?-failed Clomid 50 mg, one tablet daily  ?-testosterone level 211, 11/2021 ?-H & H 14.5/43.9%, 10/2021 ?-CMP WNL, 11/15/2021 ? ? ?Chief Complaint  ?Patient presents with  ? Erectile Dysfunction  ? ? ? ?HPI: ?[name] is a 66 y.o. male who presents today for Trimix titration.   ? ? ? ?Physical Exam:  ?BP  113/68   Pulse 69   Ht '5\' 11"'$  (1.803 m)   Wt (!) 313 lb (142 kg)   BMI 43.65 kg/m?   ?Constitutional:  Well nourished. Alert and oriented, No acute distress. ?GU: No CVA tenderness.  No bladder fullness or masses.  Patient with uncircumcised phallus.  Foreskin easily retracted  Urethral meatus is patent.  No penile discharge. No penile lesions or rashes. Scrotum without lesions, cysts, rashes and/or edema.   ?Psychiatric: Normal mood and affect. ? ? ?Procedure  ?Patient's left corpus cavernosum is identified.  An area near the base of the penis is cleansed with rubbing alcohol.  Careful to avoid the dorsal vein, 2 mcg of Trimix (papaverine 30 mg, phentolamine 1 mg and prostaglandin E1 10 mcg, Lot # [id number]'@35'$  exp # 02/07/2022 is injected at a 90 degree angle into the left corpus cavernosum near the base of the penis.  Patient experienced nothing 15 minutes.   ? ?Patient's right corpus cavernosum is identified.  An area near the base of the penis is cleansed with rubbing alcohol.  Careful to avoid the dorsal vein, 2 mcg of Trimix (papaverine 30 mg, phentolamine 1 mg and prostaglandin E1 10 mcg, Lot # [id number]'@35'$  exp # 02/07/2022 is injected at a 90 degree angle into the right corpus cavernosum near the base of the penis.  Patient experienced a very firm erection in 15 minutes.   ? ?Assessment & Plan:   ? ?1. ED ?-He had a firm erection after 4 units of the Trimix 30/1/10 were injected ?-Patient returned to the office with priapism. ?-Phallus was prepped using Betadine. 100 ?g of phenylephrine solution (  lot # [id number], exp. 07/2022) with saline solution ([id number] 01 [id number])  was administered into the right mid shaft corpora. Pressure was held on the injection site. Vitals are checked throughout the entire procedure. After 5 minutes, he had a reduction in pain but with no detumescence.  A second 100 ? injection was given this time, in the left corpora with no detumescence, but decrease in size of erection is  noted and a decrease in pain.  A third 100 ? injection was administered into the right mid shaft corpora.   He continue to have a reduction in pain and reduction in the size of erection, but he still had firmness in the corpora.  A fourth 100 ? injection was given in the left corpora.  We then checked on him 15 minutes later and his penis was completely flaccid.    ?-Going forward, I have recommended that they do not inject tomorrow.  With the next injection, to start with 2 mcg. And if no satisfactory erection is noted, two after inject 3 mcg, but no more than four.  ?-if a satisfactory erection is not achieved with 3 mcg, he will make another appointment  ?-Advised patient of the condition of priapism, painful erection lasting for more than four hours, and to contact the office immediately or seek treatment in the ED  ? ? ?Nori Riis, PA-C ? ?[place] ?[address] [place], [place] [address] ?[phone] ? ? ?

## 2022-01-01 ENCOUNTER — Ambulatory Visit: Payer: Medicare HMO | Admitting: Urology

## 2022-01-01 ENCOUNTER — Encounter: Payer: Self-pay | Admitting: Urology

## 2022-01-01 VITALS — BP 113/68 | HR 69 | Ht 71.0 in | Wt 313.0 lb

## 2022-01-01 DIAGNOSIS — N529 Male erectile dysfunction, unspecified: Secondary | ICD-10-CM | POA: Diagnosis not present

## 2022-01-01 DIAGNOSIS — N4830 Priapism, unspecified: Secondary | ICD-10-CM

## 2022-01-01 MED ORDER — PHENYLEPHRINE HCL (PRESSORS) 10 MG/ML IV SOLN
0.1000 mg | Freq: Once | INTRAVENOUS | Status: AC
  Administered 2022-01-01: 0.1 mg via SUBCUTANEOUS

## 2022-01-01 MED ORDER — PHENYLEPHRINE 100 MCG/ML FOR PRIAPISM (OUTPATIENT ~~LOC~~ UROLOGY USE ONLY): 100.0000 ug | Freq: Once | INTRAMUSCULAR | Status: DC

## 2022-01-01 NOTE — Patient Instructions (Addendum)
TRIMIX SELF-INJECTION INSTRUCTIONS  ?  ?DETAILED PROCEDURE  ?1. GETTING SET UP  ?A. Proper hygiene is important. Wash your hands and keep the penis clean.  ?B. Assemble the following:  ?- Bottle of Trimix  ?- Alcohol pad  ?- Syringe  ?C. Keep the Trimix cold by returning the bottle to the refrigerator, or by placing the bottle in a cup of ice.  ? ?2. PREPARE THE SYRINGE  ?A. Wipe the rubber top of the vial with an alcohol pad.  ?B. After removing the cap of the needle, pull the plunger back to the desired dosage, filling this volume with air. Use a new needle and syringe each time.  ?C. Insert the needle through the rubber top and inject the air into the vial.  ?D. Turn the vial with needle and syringe inserted upside down. Pull back on the syringe plunger in a slow and steady motion until the desired dosage is achieved.  ?E. Tap the side of the syringe (1cc tuberculin syringe with a 29 gauge needle) to allow any air bubbles to float towards the needle. Avoid having these air bubbles in the syringe when self-injecting by first injecting out the collected bubbles that may form.  ?F. Remove the needle from the bottle and replace the protective cap on the needle. ? ?  ?3. SELECT AND PREPARE THE SITE FOR INJECTION  ?A. The proper location for injection is at the 9-11 and 1-3 o'clock positions, between the base and mid-portion of the penis.(see diagram) Avoid the midline because of potential for injury to the urethra (6 o'clock; for urinary passage) and the penile arteries and nerves (near 12 o'clock). Avoid any visible veins or arteries on the surface.  ?B. Grasp and pull the head of the penis toward the side of your leg with the index finger and thumb (use the left hand, if right handed). While maintaining light tension, select a site for injection.  ?C. Clean the site with an alcohol pad.  ? ?4: INJECT TRIMIX AND APPLY COMPRESSION  ?A. With a steady and continuous motion, penetrate the skin with the needle at a 90 o  angle. The needle should then be advanced to the hub. Slight resistance is encountered as the needle passes into the proper position within the erectile tissue (corporeal body).  ?B. Inject the Trimix over approximately 4 seconds. Withdraw the needle from the penis and apply compression to the injection site for approximately 1 minute. Several minutes of compression may be required to avoid bleeding, especially if you are an aspirin user.  ?C. Replace the cap on the needle and dispose of properly.  ? ? ?If you experience a painful erection lasting for more than four hours, and to contact the office immediately or seek treatment in the ED  ?

## 2022-01-07 ENCOUNTER — Other Ambulatory Visit: Payer: Self-pay | Admitting: Family Medicine

## 2022-01-07 ENCOUNTER — Encounter: Payer: Self-pay | Admitting: Family Medicine

## 2022-01-07 MED ORDER — GABAPENTIN 600 MG PO TABS: 600.0000 mg | ORAL_TABLET | Freq: Three times a day (TID) | ORAL | 2 refills | Status: DC

## 2022-01-07 NOTE — Telephone Encounter (Signed)
Refill request Gabapentin ?Last refill 11/15/21 #[id number] ?Last office visit 11/15/21 ?Upcoming appointment 02/15/22 ?

## 2022-01-08 ENCOUNTER — Emergency Department
Admission: EM | Admit: 2022-01-08 | Discharge: 2022-01-08 | Disposition: A | Discharge status: Home / Self Care | Payer: Medicare HMO | Attending: Emergency Medicine | Admitting: Emergency Medicine

## 2022-01-08 ENCOUNTER — Telehealth: Payer: Self-pay

## 2022-01-08 ENCOUNTER — Emergency Department: Payer: Medicare HMO

## 2022-01-08 ENCOUNTER — Other Ambulatory Visit: Payer: Self-pay

## 2022-01-08 DIAGNOSIS — G510 Bell's palsy: Secondary | ICD-10-CM | POA: Insufficient documentation

## 2022-01-08 DIAGNOSIS — R2689 Other abnormalities of gait and mobility: Secondary | ICD-10-CM | POA: Diagnosis not present

## 2022-01-08 DIAGNOSIS — E039 Hypothyroidism, unspecified: Secondary | ICD-10-CM | POA: Insufficient documentation

## 2022-01-08 DIAGNOSIS — R202 Paresthesia of skin: Secondary | ICD-10-CM | POA: Insufficient documentation

## 2022-01-08 DIAGNOSIS — R29810 Facial weakness: Secondary | ICD-10-CM | POA: Insufficient documentation

## 2022-01-08 DIAGNOSIS — I2510 Atherosclerotic heart disease of native coronary artery without angina pectoris: Secondary | ICD-10-CM | POA: Insufficient documentation

## 2022-01-08 DIAGNOSIS — M546 Pain in thoracic spine: Secondary | ICD-10-CM | POA: Insufficient documentation

## 2022-01-08 DIAGNOSIS — R42 Dizziness and giddiness: Secondary | ICD-10-CM | POA: Diagnosis not present

## 2022-01-08 DIAGNOSIS — M4802 Spinal stenosis, cervical region: Secondary | ICD-10-CM | POA: Insufficient documentation

## 2022-01-08 DIAGNOSIS — H538 Other visual disturbances: Secondary | ICD-10-CM | POA: Diagnosis not present

## 2022-01-08 DIAGNOSIS — R110 Nausea: Secondary | ICD-10-CM | POA: Insufficient documentation

## 2022-01-08 DIAGNOSIS — M2578 Osteophyte, vertebrae: Secondary | ICD-10-CM | POA: Insufficient documentation

## 2022-01-08 DIAGNOSIS — Q281 Other malformations of precerebral vessels: Secondary | ICD-10-CM | POA: Diagnosis not present

## 2022-01-08 DIAGNOSIS — M79601 Pain in right arm: Secondary | ICD-10-CM | POA: Insufficient documentation

## 2022-01-08 DIAGNOSIS — I10 Essential (primary) hypertension: Secondary | ICD-10-CM | POA: Insufficient documentation

## 2022-01-08 LAB — CBC
HCT: 42.6 % (ref 39.0–52.0)
Hemoglobin: 13.6 g/dL (ref 13.0–17.0)
MCH: 27.8 pg (ref 26.0–34.0)
MCHC: 31.9 g/dL (ref 30.0–36.0)
MCV: 87.1 fL (ref 80.0–100.0)
Platelets: 226 10*3/uL (ref 150–400)
RBC: 4.89 MIL/uL (ref 4.22–5.81)
RDW: 13.2 % (ref 11.5–15.5)
WBC: 6.8 10*3/uL (ref 4.0–10.5)
nRBC: 0 % (ref 0.0–0.2)

## 2022-01-08 LAB — URINALYSIS, ROUTINE W REFLEX MICROSCOPIC
Bilirubin Urine: NEGATIVE
Glucose, UA: NEGATIVE mg/dL
Hgb urine dipstick: NEGATIVE
Ketones, ur: NEGATIVE mg/dL
Leukocytes,Ua: NEGATIVE
Nitrite: NEGATIVE
Protein, ur: NEGATIVE mg/dL
Specific Gravity, Urine: 1.014 (ref 1.005–1.030)
pH: 6 (ref 5.0–8.0)

## 2022-01-08 LAB — BASIC METABOLIC PANEL
Anion gap: 8 (ref 5–15)
BUN: 16 mg/dL (ref 8–23)
CO2: 26 mmol/L (ref 22–32)
Calcium: 8.9 mg/dL (ref 8.9–10.3)
Chloride: 106 mmol/L (ref 98–111)
Creatinine, Ser: 1.12 mg/dL (ref 0.61–1.24)
GFR, Estimated: >60 mL/min (ref >60)
Glucose, Bld: 104 mg/dL — ABNORMAL HIGH (ref 70–99)
Potassium: 3.9 mmol/L (ref 3.5–5.1)
Sodium: 140 mmol/L (ref 135–145)

## 2022-01-08 LAB — TROPONIN I (HIGH SENSITIVITY): Troponin I (High Sensitivity): 4 ng/L (ref <18)

## 2022-01-08 LAB — APTT: aPTT: 28 seconds (ref 24–36)

## 2022-01-08 LAB — CBG MONITORING, ED: Glucose-Capillary: 112 mg/dL — ABNORMAL HIGH (ref 70–99)

## 2022-01-08 MED ORDER — GADOBUTROL 1 MMOL/ML IV SOLN
10.0000 mL | Freq: Once | INTRAVENOUS | Status: AC | PRN
  Administered 2022-01-08: 10 mL via INTRAVENOUS

## 2022-01-08 NOTE — Telephone Encounter (Signed)
Agree with recommendations and plan

## 2022-01-08 NOTE — ED Notes (Signed)
Report received from Lindsay, RN

## 2022-01-08 NOTE — ED Notes (Signed)
Called MRI to check to see when pt will have his MRI. Pt appears upset and is stating, "I want to know what's going on, it seems like they don't care". Reassured pt that I will contact MRI again to see where he's at in line. Pt verbalizes understanding. ?

## 2022-01-08 NOTE — ED Notes (Signed)
ED Provider at bedside. 
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
## 2022-01-08 NOTE — ED Notes (Signed)
Patient remains in MRI 

## 2022-01-08 NOTE — Discharge Instructions (Addendum)
Your MR of your Cervical spine showed: ?IMPRESSION: ?1. Severe right C4, C5 and C6 neural foraminal stenosis secondary to ?right-sided disc osteophyte complexes. ?2. Mild spinal canal stenosis at C4-5. ? ?MRA of your head and neck showed: ?IMPRESSION: ?1. No acute intracranial abnormality. ?2. 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 Diminutive basilar artery. ?3. Otherwise unremarkable MRA of the head and neck. ? ? ?

## 2022-01-08 NOTE — Telephone Encounter (Signed)
?  Encounter Messages ? ?Read Composed From To  Subject  ?Y 01/08/2022 10:41 AM [name] Patient Appointment Schedule Request Mailing List  Appointment scheduled from [place]  ?N 01/08/2022 10:41 AM Mychart, Generic [name]  Appointment Scheduled  ? ?Appointment scheduled from [place] ?Message [phone] ?From  ?[name] To  ?Patient Appointment Schedule Request Pool Sent  ?01/08/2022 10:41 AM  ?   ?Appointment For: [name] ([id number]) ?Visit Type: OFFICE VISIT (1004) ?  ?01/10/2022   12:20 PM  20 mins.  [name], MD       [facility] ?  ?Patient Comments: ?My right arm and hand, it is tingling all the time and seems heavy to lift. If it stays in one position to long its like the elbow locks and hard to move.  ?  ? ?Responsible Party ? ?Pool - [place] Triage  ?Message taken by Helene Shoe, LPN on 0/96/4383 81:84 PM  ? ?[address] ? ?User Action Time  ?Aniceto Boss Done 01/08/2022 11:28 AM  ? ?

## 2022-01-08 NOTE — Telephone Encounter (Signed)
I spoke with pt; pt said for couple of weeks pt has had rt arm and hand tingling and feels heaviness when tries to lift rt arm;pt has been noticing stumbling more and balance has been off. Pt does not have way to ck BP. Pt has not had problems in speech. I explained to pt could be different things that would cause these symptoms but one of more concerning possibilities was warning symptoms prior to stroke; pt is going to [facility] ED now for eval and possible imaging if provider thinks needed. Cancelling appt for 01/10/22. Pt voiced understanding and appreciative of call. Sending note to Dr Einar Pheasant. ?

## 2022-01-08 NOTE — ED Notes (Signed)
Patient to MRI.

## 2022-01-08 NOTE — ED Notes (Signed)
Called MRI back and the technologist states that it will be around 2030 before he can come get the pt to do his MRI. States he currently has one on the table and another waiting. Updated pt and his wife and pt states that "at 8:00, you will take my IV out and I'm leaving". Advised pt that it was his choice, but encouraged him to stay. MD notified and aware. ?

## 2022-01-08 NOTE — ED Triage Notes (Signed)
Pt here with right arm tingling, blurred vision, dizziness, lightheadedness, and unstable gait that started last Wed. Pt states his symptoms have gotten much worse. Pt has facial droop but states he has a hx of Bell's palsy.  ?

## 2022-01-09 ENCOUNTER — Encounter: Payer: Self-pay | Admitting: Family Medicine

## 2022-01-10 ENCOUNTER — Encounter: Payer: Self-pay | Admitting: Family Medicine

## 2022-01-10 ENCOUNTER — Ambulatory Visit (INDEPENDENT_AMBULATORY_CARE_PROVIDER_SITE_OTHER): Payer: Medicare HMO | Admitting: Family Medicine

## 2022-01-10 VITALS — BP 124/80 | HR 71 | Temp 98.1°F | Ht 71.0 in | Wt 324.0 lb

## 2022-01-10 DIAGNOSIS — R200 Anesthesia of skin: Secondary | ICD-10-CM | POA: Diagnosis not present

## 2022-01-10 DIAGNOSIS — I10 Essential (primary) hypertension: Secondary | ICD-10-CM

## 2022-01-10 DIAGNOSIS — M4802 Spinal stenosis, cervical region: Secondary | ICD-10-CM | POA: Diagnosis not present

## 2022-01-10 DIAGNOSIS — R202 Paresthesia of skin: Secondary | ICD-10-CM

## 2022-01-10 MED ORDER — PREDNISONE 20 MG PO TABS: ORAL_TABLET | ORAL | 0 refills | Status: AC

## 2022-01-10 NOTE — Progress Notes (Signed)
Subjective:     [name] is a 66 y.o. male presenting for Arm Pain (R. Tingling at times and feels heavy) and Dizziness (And feet don't go where he wants them to go at times)     Arm Pain   Dizziness   #Right arm pain - with tingling - heavy sensation - when he holds it up - the should down will get heavy - if he moves or changes position the symptoms improve - tingling sensation in the hand and lower arm when he rests the arm across his chest  #lightheadedness/dizziness - stumbling more - worse if he turns around and has a hard time getting his foot to go where he wants it to go - will get pressure on the anterior head - temple area - no sensation that the room is spinning - also when transitions from sitting to standing - improves with sitting/laying down - started isosorbide dinitrate for 3 weeks which seems to correlate   Review of Systems  Neurological:  Positive for dizziness.   01/08/2022: ER - Right arm pain/weakness - CT head nromal, MRI Brain normal, MRA head/neck with persistent trigeminal artery. MR spine - C4-6 stenosis and disc osteophyte complexes  Social History   Tobacco Use  Smoking Status Former   Packs/day: 0.50   Years: 13.00   Pack years: 6.50   Types: Cigarettes   Quit date: 06/15/1985   Years since quitting: 36.5  Smokeless Tobacco Never        Objective:    BP Readings from Last 3 Encounters:  01/10/22 124/80  01/08/22 (!) 144/90  01/01/22 113/68   Wt Readings from Last 3 Encounters:  01/10/22 (!) 324 lb (147 kg)  01/08/22 (!) 313 lb (142 kg)  01/01/22 (!) 313 lb (142 kg)    BP 124/80   Pulse 71   Temp 98.1 F (36.7 C) (Temporal)   Ht '5\' 11"'$  (1.803 m)   Wt (!) 324 lb (147 kg)   SpO2 93%   BMI 45.19 kg/m    Physical Exam Constitutional:      Appearance: Normal appearance. He is not ill-appearing or diaphoretic.  HENT:     Right Ear: External ear normal.     Left Ear: External ear normal.     Nose: Nose  normal.  Eyes:     General: No scleral icterus.    Extraocular Movements: Extraocular movements intact.     Conjunctiva/sclera: Conjunctivae normal.  Cardiovascular:     Rate and Rhythm: Normal rate and regular rhythm.     Heart sounds: Heart sounds are distant.  Pulmonary:     Effort: Pulmonary effort is normal.     Breath sounds: Normal breath sounds.  Musculoskeletal:     Cervical back: Neck supple.  Skin:    General: Skin is warm and dry.  Neurological:     Mental Status: He is alert. Mental status is at baseline.  Psychiatric:        Mood and Affect: Mood normal.          Assessment & Plan:   Problem List Items Addressed This Visit       Cardiovascular and Mediastinum   Essential hypertension    Blood pressure controlled.  Though complicated by some orthostatic symptoms.  Discussed with Dr. Stanford Breed who recommended decreasing isosorbide to 30 mg once daily.  Discussed with patient that prednisone could increase blood pressure so may watch and wait and do decrease after no longer on prednisone.  Continue amlodipine 5 mg and metoprolol 25 mg.         Other   Numbness and tingling of right arm - Primary    Recent ER with normal MRI Brain, MRA head/neck, but with MRI of the C-spine with stenosis. Discussed course of steroids and physical therapy. If no improvement plan for NSG referral.        Relevant Medications   predniSONE (DELTASONE) 20 MG tablet   Other Relevant Orders   Ambulatory referral to Physical Therapy   Cervical stenosis of spine   Relevant Medications   predniSONE (DELTASONE) 20 MG tablet   Other Relevant Orders   Ambulatory referral to Physical Therapy     Return in about 6 weeks (around 02/21/2022), or if symptoms worsen or fail to improve.  [name], MD

## 2022-01-10 NOTE — Assessment & Plan Note (Signed)
Blood pressure controlled.  Though complicated by some orthostatic symptoms.  Discussed with Dr. Stanford Breed who recommended decreasing isosorbide to 30 mg once daily.  Discussed with patient that prednisone could increase blood pressure so may watch and wait and do decrease after no longer on prednisone.  Continue amlodipine 5 mg and metoprolol 25 mg.

## 2022-01-10 NOTE — Assessment & Plan Note (Signed)
Recent ER with normal MRI Brain, MRA head/neck, but with MRI of the C-spine with stenosis. Discussed course of steroids and physical therapy. If no improvement plan for NSG referral.

## 2022-01-10 NOTE — Patient Instructions (Signed)
Orthostatic changes  You were one point away from the diagnosis but this is likely the cause.   Will follow-up after discussing with Dr. Stanford Breed.   Prednisone will likely increase blood pressure so would recommend monitoring for now  1) Slow transitions 2) Wear compression socks 3) Stay hydrated 4) Criss cross legs before getting out of bed  Arm symptoms - prednisone for 10 days - Referral to physical therapy - update if no improvement in 6 weeks

## 2022-01-17 ENCOUNTER — Other Ambulatory Visit: Payer: Self-pay | Admitting: Adult Health

## 2022-01-18 ENCOUNTER — Encounter: Payer: Self-pay | Admitting: Internal Medicine

## 2022-01-18 ENCOUNTER — Ambulatory Visit: Payer: Medicare HMO | Admitting: Internal Medicine

## 2022-01-18 VITALS — BP 124/80 | HR 65 | Temp 98.0°F | Ht 70.0 in | Wt 327.6 lb

## 2022-01-18 DIAGNOSIS — R0602 Shortness of breath: Secondary | ICD-10-CM

## 2022-01-18 DIAGNOSIS — Z9989 Dependence on other enabling machines and devices: Secondary | ICD-10-CM | POA: Diagnosis not present

## 2022-01-18 DIAGNOSIS — G4733 Obstructive sleep apnea (adult) (pediatric): Secondary | ICD-10-CM

## 2022-01-18 NOTE — Patient Instructions (Signed)
Please schedule follow up scheduled with myself in 3 months.  If my schedule is not open yet, we will contact you with a reminder closer to that time. Please call [phone] if you haven't heard from [place] a month before.   Before your next visit I would like you to have: CPAP titration study - in lab PFT - 1 hour  I will communicate the results to you once we have them.  I will adjust your CPAP settings for you once we have those results.
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
## 2022-01-21 ENCOUNTER — Encounter: Payer: Self-pay | Admitting: Family Medicine

## 2022-01-22 ENCOUNTER — Ambulatory Visit: Payer: Medicare HMO | Admitting: Psychology

## 2022-01-29 ENCOUNTER — Ambulatory Visit: Payer: Medicare HMO | Admitting: Psychology

## 2022-01-30 ENCOUNTER — Encounter: Payer: Self-pay | Admitting: Family Medicine

## 2022-01-30 ENCOUNTER — Ambulatory Visit (INDEPENDENT_AMBULATORY_CARE_PROVIDER_SITE_OTHER): Payer: Medicare HMO | Admitting: Internal Medicine

## 2022-01-30 DIAGNOSIS — R0602 Shortness of breath: Secondary | ICD-10-CM

## 2022-01-30 LAB — PULMONARY FUNCTION TEST
DL/VA % pred: 129 %
DL/VA: 5.34 ml/min/mmHg/L
DLCO cor % pred: 79 %
DLCO cor: 21.84 ml/min/mmHg
DLCO unc % pred: 76 %
DLCO unc: 21.2 ml/min/mmHg
FEF 25-75 Post: 2.31 L/sec
FEF 25-75 Pre: 2.23 L/sec
FEF2575-%Change-Post: 3 %
FEF2575-%Pred-Post: 82 %
FEF2575-%Pred-Pre: 79 %
FEV1-%Change-Post: 0 %
FEV1-%Pred-Post: 70 %
FEV1-%Pred-Pre: 70 %
FEV1-Post: 2.51 L
FEV1-Pre: 2.5 L
FEV1FVC-%Change-Post: 3 %
FEV1FVC-%Pred-Pre: 104 %
FEV6-%Change-Post: -3 %
FEV6-%Pred-Post: 68 %
FEV6-%Pred-Pre: 70 %
FEV6-Post: 3.12 L
FEV6-Pre: 3.21 L
FEV6FVC-%Change-Post: 0 %
FEV6FVC-%Pred-Post: 105 %
FEV6FVC-%Pred-Pre: 105 %
FVC-%Change-Post: -3 %
FVC-%Pred-Post: 65 %
FVC-%Pred-Pre: 67 %
FVC-Post: 3.12 L
FVC-Pre: 3.22 L
Post FEV1/FVC ratio: 80 %
Post FEV6/FVC ratio: 100 %
Pre FEV1/FVC ratio: 78 %
Pre FEV6/FVC Ratio: 100 %
RV % pred: 85 %
RV: 2.05 L
TLC % pred: 84 %
TLC: 6.07 L

## 2022-01-30 NOTE — Progress Notes (Signed)
Full PFT Performed Today  

## 2022-01-30 NOTE — Patient Instructions (Signed)
Full PFT Performed Today  

## 2022-01-31 DIAGNOSIS — H16211 Exposure keratoconjunctivitis, right eye: Secondary | ICD-10-CM | POA: Diagnosis not present

## 2022-02-05 ENCOUNTER — Encounter: Payer: Self-pay | Admitting: Internal Medicine

## 2022-02-07 ENCOUNTER — Encounter: Payer: Self-pay | Admitting: Family Medicine

## 2022-02-07 NOTE — Telephone Encounter (Signed)
Pt has had stopped up rt ear for one wk and OTC ear drops not helping; pt also wants in depth hearing test. Pt is scheduled appt with Dr Einar Pheasant on 02/08/22 at [facility]. Pt said rt ear has cricket sounds; and hard to hear out of rt ear. No H/A or dizziness more than usual and no pain and no bloody drainage but on and off clear yellow drainage. UC & ED precautions given and pt voiced understanding. Sending note to Dr Einar Pheasant and [facility] CMA.

## 2022-02-07 NOTE — Telephone Encounter (Signed)
Mail box full not able to leave message.

## 2022-02-07 NOTE — Telephone Encounter (Signed)
Noted will see tomorrow 

## 2022-02-08 ENCOUNTER — Ambulatory Visit (INDEPENDENT_AMBULATORY_CARE_PROVIDER_SITE_OTHER): Payer: Medicare HMO | Admitting: Family Medicine

## 2022-02-08 ENCOUNTER — Encounter: Payer: Self-pay | Admitting: Family Medicine

## 2022-02-08 VITALS — BP 120/70 | HR 58 | Temp 97.7°F | Ht 70.0 in | Wt 329.2 lb

## 2022-02-08 DIAGNOSIS — M25561 Pain in right knee: Secondary | ICD-10-CM

## 2022-02-08 DIAGNOSIS — R202 Paresthesia of skin: Secondary | ICD-10-CM | POA: Diagnosis not present

## 2022-02-08 DIAGNOSIS — G8929 Other chronic pain: Secondary | ICD-10-CM

## 2022-02-08 DIAGNOSIS — R200 Anesthesia of skin: Secondary | ICD-10-CM | POA: Diagnosis not present

## 2022-02-08 DIAGNOSIS — H6121 Impacted cerumen, right ear: Secondary | ICD-10-CM | POA: Diagnosis not present

## 2022-02-08 DIAGNOSIS — M25562 Pain in left knee: Secondary | ICD-10-CM

## 2022-02-08 DIAGNOSIS — H9193 Unspecified hearing loss, bilateral: Secondary | ICD-10-CM

## 2022-02-08 NOTE — Assessment & Plan Note (Signed)
Notes prior history and worsening. Improvement slightly with cerumen removal but would like to see audiology due to hx of hearing loss and progression.

## 2022-02-08 NOTE — Progress Notes (Signed)
Subjective:     Jonh Mcqueary is a 66 y.o. male presenting for Ear Fullness (R )     Ear Fullness     #Right ear - no other symptoms - no itchy  #Knee pain - difficulty to get up and down - got a steroid shot in December - is interested in surgery - already seeing ortho and thinking about surgery  #neck pain/arm tingling - never heard from PT - mychart message never responded to  Review of Systems   Social History   Tobacco Use  Smoking Status Former   Packs/day: 0.50   Years: 13.00   Total pack years: 6.50   Types: Cigarettes   Quit date: 06/15/1985   Years since quitting: 36.6  Smokeless Tobacco Never        Objective:    BP Readings from Last 3 Encounters:  02/08/22 120/70  01/18/22 124/80  01/10/22 124/80   Wt Readings from Last 3 Encounters:  02/08/22 (!) 329 lb 4 oz (149.3 kg)  01/18/22 (!) 327 lb 9.6 oz (148.6 kg)  01/10/22 (!) 324 lb (147 kg)    BP 120/70   Pulse (!) 58   Temp 97.7 F (36.5 C) (Temporal)   Ht '5\' 10"'$  (1.778 m)   Wt (!) 329 lb 4 oz (149.3 kg)   SpO2 94%   BMI 47.24 kg/m    Physical Exam Constitutional:      Appearance: Normal appearance. He is not ill-appearing or diaphoretic.  HENT:     Head: Normocephalic and atraumatic.     Right Ear: Tympanic membrane and external ear normal. There is impacted cerumen.     Left Ear: Tympanic membrane and external ear normal.     Ears:     Comments: Mild erythema to the canal on the right    Nose: Nose normal.  Eyes:     General: No scleral icterus.    Extraocular Movements: Extraocular movements intact.     Conjunctiva/sclera: Conjunctivae normal.  Cardiovascular:     Rate and Rhythm: Normal rate.  Pulmonary:     Effort: Pulmonary effort is normal.  Musculoskeletal:     Cervical back: Neck supple.  Skin:    General: Skin is warm and dry.  Neurological:     Mental Status: He is alert. Mental status is at baseline.  Psychiatric:        Mood and Affect: Mood  normal.           Assessment & Plan:   Problem List Items Addressed This Visit       Nervous and Auditory   Impacted cerumen of right ear    Successful removal. See procedure      Bilateral hearing loss - Primary    Notes prior history and worsening. Improvement slightly with cerumen removal but would like to see audiology due to hx of hearing loss and progression.       Relevant Orders   Ambulatory referral to Audiology     Other   Bilateral chronic knee pain    Following with ortho, s/p steroid injection with improvement but pain has returned. Planning surgery. Appreciate surgery support. Offered PT, he declined      Numbness and tingling of right arm    Resolved with steroids. Never heard from PT. Return is symptoms return.       Procedure Indication: Cerumen removal Performed by: MA and provider  Risks: Discussed with patient risk for vertigo.   Verbal consent obtained from  patient. Discussed risks and benefits and questions answered.   Equipment: Syringe and irrigation w/o success. Lighted curette   Medications and dosage and quantity: Debrox drops applied  Procedure was MA unable to remove cerumen with irrigation. Lighted curette used by provider with success removal.   Pt tolerated procedure well.     Return if symptoms worsen or fail to improve.  [name], MD

## 2022-02-08 NOTE — Assessment & Plan Note (Signed)
Following with ortho, s/p steroid injection with improvement but pain has returned. Planning surgery. Appreciate surgery support. Offered PT, he declined

## 2022-02-08 NOTE — Assessment & Plan Note (Signed)
Resolved with steroids. Never heard from PT. Return is symptoms return.

## 2022-02-08 NOTE — Assessment & Plan Note (Signed)
Successful removal. See procedure

## 2022-02-15 ENCOUNTER — Ambulatory Visit (INDEPENDENT_AMBULATORY_CARE_PROVIDER_SITE_OTHER): Payer: Medicare HMO

## 2022-02-15 ENCOUNTER — Ambulatory Visit: Payer: Medicare HMO | Admitting: Family Medicine

## 2022-02-15 ENCOUNTER — Ambulatory Visit (HOSPITAL_BASED_OUTPATIENT_CLINIC_OR_DEPARTMENT_OTHER): Payer: Medicare HMO | Admitting: Orthopaedic Surgery

## 2022-02-15 DIAGNOSIS — M1712 Unilateral primary osteoarthritis, left knee: Secondary | ICD-10-CM

## 2022-02-15 DIAGNOSIS — M1711 Unilateral primary osteoarthritis, right knee: Secondary | ICD-10-CM

## 2022-02-15 DIAGNOSIS — M170 Bilateral primary osteoarthritis of knee: Secondary | ICD-10-CM

## 2022-02-19 ENCOUNTER — Ambulatory Visit (HOSPITAL_BASED_OUTPATIENT_CLINIC_OR_DEPARTMENT_OTHER): Payer: Medicare HMO | Attending: Internal Medicine | Admitting: Internal Medicine

## 2022-02-19 VITALS — Ht 71.0 in | Wt 320.0 lb

## 2022-02-19 DIAGNOSIS — G4733 Obstructive sleep apnea (adult) (pediatric): Secondary | ICD-10-CM | POA: Insufficient documentation

## 2022-02-25 ENCOUNTER — Encounter: Payer: Self-pay | Admitting: Cardiology

## 2022-03-02 DIAGNOSIS — Z9989 Dependence on other enabling machines and devices: Secondary | ICD-10-CM | POA: Diagnosis not present

## 2022-03-02 DIAGNOSIS — G4733 Obstructive sleep apnea (adult) (pediatric): Secondary | ICD-10-CM

## 2022-03-02 NOTE — Procedures (Signed)
    Patient Name: [name], [name] Date: 02/19/2022 Gender: Male D.O.B: 11-08-1955 Age (years): 75 Referring Provider: Lenice Llamas MD Height (inches): 71 Interpreting Physician: Baird Lyons MD, ABSM Weight (lbs): 320 RPSGT: Laren Everts BMI: 45 MRN: [id number] Neck Size: 18.00  CLINICAL INFORMATION The patient is referred for a CPAP titration to treat sleep apnea.  Date of NPSG, Split Night or HST:  SLEEP STUDY TECHNIQUE As per the AASM Manual for the Scoring of Sleep and Associated Events v2.3 (April 2016) with a hypopnea requiring 4% desaturations.  The channels recorded and monitored were frontal, central and occipital EEG, electrooculogram (EOG), submentalis EMG (chin), nasal and oral airflow, thoracic and abdominal wall motion, anterior tibialis EMG, snore microphone, electrocardiogram, and pulse oximetry. Continuous positive airway pressure (CPAP) was initiated at the beginning of the study and titrated to treat sleep-disordered breathing.  MEDICATIONS Medications self-administered by patient taken the night of the study : GABAPENTIN, ISOSORBIDE DINITRATE, TRAZODONE, METOPROLOL SUCCINATE  TECHNICIAN COMMENTS Comments added by technician: O2 initiated due to low sats. Comments added by scorer: N/A  RESPIRATORY PARAMETERS Optimal PAP Pressure (cm): 15 AHI at Optimal Pressure (/hr): 0 Overall Minimal O2 (%): 84.0 Supine % at Optimal Pressure (%): 33 Minimal O2 at Optimal Pressure (%): 88.0   SLEEP ARCHITECTURE The study was initiated at 10:28:08 PM and ended at 5:01:28 AM.  Sleep onset time was 25.0 minutes and the sleep efficiency was 85.4%%. The total sleep time was 336 minutes.  The patient spent 12.6%% of the night in stage N1 sleep, 76.9%% in stage N2 sleep, 0.0%% in stage N3 and 10.4% in REM.Stage REM latency was 296.0 minutes  Wake after sleep onset was 32.3. Alpha intrusion was absent. Supine sleep was 36.14%.  CARDIAC DATA The 2 lead EKG  demonstrated sinus rhythm. The mean heart rate was 54.9 beats per minute. Other EKG findings include: PVCs.  LEG MOVEMENT DATA The total Periodic Limb Movements of Sleep (PLMS) were 0. The PLMS index was 0.0. A PLMS index of <15 is considered normal in adults.  IMPRESSIONS - The optimal PAP pressure was 15 cm of water. - Central sleep apnea was not noted during this titration (CAI = 0.2/h). - Moderate oxygen desaturations were observed during this titration (min O2 = 84.0%). O2 saturation on CPAP 15- minimum 88%, mean 92.2%. - The patient snored with moderate snoring volume during this titration study. - 2-lead EKG demonstrated: PVCs - Clinically significant periodic limb movements were not noted during this study. Arousals associated with PLMs were rare.  DIAGNOSIS - Obstructive Sleep Apnea (G47.33)  RECOMMENDATIONS - Trial of CPAP therapy on 15 cm H2O or autopap 10-20. - Patient used a Small-Medium size Fisher[employer]Paykel Full Face Evora Full mask and heated humidification. - Be careful with alcohol, sedatives and other CNS depressants that may worsen sleep apnea and disrupt normal sleep architecture. - Sleep hygiene should be reviewed to assess factors that may improve sleep quality. - Weight management and regular exercise should be initiated or continued.  [Electronically signed] 03/02/2022 11:02 AM  Baird Lyons MD, ABSM Diplomate, American Board of Sleep Medicine NPI: [id number]                         [facility]  ELECTRONICALLY SIGNED ON:  03/02/2022, 10:58 AM [place] PH: [phone]   FX: [phone] [place]

## 2022-03-05 ENCOUNTER — Encounter: Payer: Self-pay | Admitting: Family Medicine

## 2022-03-05 ENCOUNTER — Telehealth: Payer: Self-pay | Admitting: Internal Medicine

## 2022-03-05 DIAGNOSIS — H903 Sensorineural hearing loss, bilateral: Secondary | ICD-10-CM | POA: Diagnosis not present

## 2022-03-05 NOTE — Telephone Encounter (Signed)
New diagnosis sleep apnea. Reviewed sleep study results:  - Trial of autopap 10-20. - Small-Medium size Fisher[employer]Paykel Full Face Evora Full mask and heated humidification.  Follow up with me as scheduled.

## 2022-03-07 ENCOUNTER — Encounter: Payer: Self-pay | Admitting: Internal Medicine

## 2022-03-08 NOTE — Telephone Encounter (Signed)
Dr. Shearon Stalls, pt is requesting the detailed interpreted CPAP titration results from you. Pt is not wanting to move forward with order without see the results. Thanks.

## 2022-03-08 NOTE — Telephone Encounter (Signed)
See My Chart message from 03/07/22

## 2022-03-21 ENCOUNTER — Other Ambulatory Visit: Payer: Self-pay | Admitting: Cardiology

## 2022-03-21 ENCOUNTER — Other Ambulatory Visit: Payer: Self-pay | Admitting: Family Medicine

## 2022-03-21 NOTE — Telephone Encounter (Signed)
Last filled 01/07/22 Last ov 02/08/22

## 2022-04-02 ENCOUNTER — Ambulatory Visit: Payer: Medicare HMO | Attending: Urology | Admitting: Physical Therapy

## 2022-04-02 DIAGNOSIS — N3941 Urge incontinence: Secondary | ICD-10-CM | POA: Insufficient documentation

## 2022-04-02 DIAGNOSIS — R2689 Other abnormalities of gait and mobility: Secondary | ICD-10-CM | POA: Diagnosis not present

## 2022-04-02 DIAGNOSIS — R278 Other lack of coordination: Secondary | ICD-10-CM | POA: Insufficient documentation

## 2022-04-02 DIAGNOSIS — M533 Sacrococcygeal disorders, not elsewhere classified: Secondary | ICD-10-CM | POA: Diagnosis not present

## 2022-04-02 NOTE — Patient Instructions (Signed)
Avoid straining pelvic floor, abdominal muscles , spine  Use log rolling technique instead of getting out of bed with your neck or the sit-up    Proper body mechanics with getting out of a chair to decrease strain  on back &pelvic floor   Avoid holding your breath when Getting out of the chair:  Scoot to front part of chair chair Heels behind knees, feet are hip width apart, nose over toes  Inhale like you are smelling roses Exhale to stand   __  Sitting with feet on ground, four points of contact Catch yourself crossing ankles and thighs  __
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
## 2022-04-10 ENCOUNTER — Telehealth: Payer: Self-pay | Admitting: Family Medicine

## 2022-04-10 ENCOUNTER — Ambulatory Visit: Payer: Medicare HMO | Admitting: Physical Therapy

## 2022-04-10 DIAGNOSIS — R278 Other lack of coordination: Secondary | ICD-10-CM

## 2022-04-10 DIAGNOSIS — M533 Sacrococcygeal disorders, not elsewhere classified: Secondary | ICD-10-CM

## 2022-04-10 DIAGNOSIS — R2689 Other abnormalities of gait and mobility: Secondary | ICD-10-CM

## 2022-04-10 NOTE — Telephone Encounter (Signed)
LVM for patient to call and schedule
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
## 2022-04-12 NOTE — Telephone Encounter (Signed)
Patient has been scheduled

## 2022-04-15 ENCOUNTER — Ambulatory Visit (INDEPENDENT_AMBULATORY_CARE_PROVIDER_SITE_OTHER): Payer: Medicare HMO | Admitting: Family Medicine

## 2022-04-15 ENCOUNTER — Encounter: Payer: Self-pay | Admitting: Family Medicine

## 2022-04-15 VITALS — BP 118/76 | HR 58 | Temp 98.6°F | Resp 16 | Ht 71.0 in | Wt 325.2 lb

## 2022-04-15 DIAGNOSIS — H60543 Acute eczematoid otitis externa, bilateral: Secondary | ICD-10-CM | POA: Diagnosis not present

## 2022-04-15 DIAGNOSIS — F3342 Major depressive disorder, recurrent, in full remission: Secondary | ICD-10-CM | POA: Diagnosis not present

## 2022-04-15 DIAGNOSIS — G4700 Insomnia, unspecified: Secondary | ICD-10-CM | POA: Diagnosis not present

## 2022-04-15 MED ORDER — TRIAMCINOLONE ACETONIDE 0.1 % EX CREA: 1.0000 | TOPICAL_CREAM | Freq: Two times a day (BID) | CUTANEOUS | 0 refills | Status: DC

## 2022-04-15 MED ORDER — HYDROCORTISONE-ACETIC ACID 1-2 % OT SOLN: 3.0000 [drp] | Freq: Two times a day (BID) | OTIC | 0 refills | Status: DC | PRN

## 2022-04-15 NOTE — Progress Notes (Signed)
Subjective:     [name] is a 66 y.o. male presenting for Depression     HPI  #Depression - best friend passed away over the weekend - traveling to the funeral  - feels he is doing well  #sleep - taking trazodone 200 mg - will take a few hours to fall asleep - will wake up and have trouble falling back asleep - will take tylenol-PM occasionally   #Hearing issues - had it checked  - low to moderate range - got hearing aides over the internet w/ some improvmeent - ear itching - does get some skin changes - not wax  Review of Systems   Social History   Tobacco Use  Smoking Status Former   Packs/day: 0.50   Years: 13.00   Total pack years: 6.50   Types: Cigarettes   Quit date: 06/15/1985   Years since quitting: 36.8  Smokeless Tobacco Never        Objective:    BP Readings from Last 3 Encounters:  04/15/22 118/76  02/08/22 120/70  01/18/22 124/80   Wt Readings from Last 3 Encounters:  04/15/22 (!) 325 lb 4 oz (147.5 kg)  02/19/22 (!) 320 lb (145.2 kg)  02/08/22 (!) 329 lb 4 oz (149.3 kg)    BP 118/76   Pulse (!) 58   Temp 98.6 F (37 C)   Resp 16   Ht '5\' 11"'$  (1.803 m)   Wt (!) 325 lb 4 oz (147.5 kg)   SpO2 96%   BMI 45.36 kg/m    Physical Exam Constitutional:      Appearance: Normal appearance. He is not ill-appearing or diaphoretic.  HENT:     Head:     Comments: Bilateral external ears with mild erythema and dry skin. Inner ears with some canal erythema    Right Ear: Tympanic membrane normal.     Left Ear: Tympanic membrane normal.     Nose: Nose normal.  Eyes:     General: No scleral icterus.    Extraocular Movements: Extraocular movements intact.     Conjunctiva/sclera: Conjunctivae normal.  Cardiovascular:     Rate and Rhythm: Normal rate.  Pulmonary:     Effort: Pulmonary effort is normal.  Musculoskeletal:     Cervical back: Neck supple.  Skin:    General: Skin is warm and dry.  Neurological:     Mental Status: He  is alert. Mental status is at baseline.  Psychiatric:        Mood and Affect: Mood normal.         04/15/2022   11:20 AM 11/15/2021    9:53 AM 11/13/2020    3:07 PM  Depression screen PHQ 2/9  Decreased Interest 1 0 0  Down, Depressed, Hopeless 1 1 0  PHQ - 2 Score 2 1 0  Altered sleeping 3 0 3  Tired, decreased energy '2 2 1  '$ Change in appetite 3 0 0  Feeling bad or failure about yourself  1 1 0  Trouble concentrating 1 1 0  Moving slowly or fidgety/restless 1 0 0  Suicidal thoughts 0 0 0  PHQ-9 Score '13 5 4  '$ Difficult doing work/chores Somewhat difficult Very difficult Somewhat difficult         Assessment & Plan:   Problem List Items Addressed This Visit       Nervous and Auditory   Acute eczematoid otitis externa of both ears    Discussed trial of triamcinolone for outer ear  and hydrocortisone for inner ear. If no improvement update and may need ENT support.       Relevant Medications   acetic acid-hydrocortisone (VOSOL-HC) OTIC solution   triamcinolone cream (KENALOG) 0.1 %     Other   Major depressive disorder in full remission (HCC) - Primary    PHQ-9 slightly elevated today in the setting of the recent loss of a best friend, and his wife recently hospitalized.  Overall he feels his depression is stable and controlled.  Continue Wellbutrin 300 mg, Cymbalta 60 mg.      Insomnia    Not well controlled, at this time he does not want to dramatically change medication but feels trazodone is not as effective as it used to be.  Need trazodone 200 mg.  Asked okay to try melatonin and/or ZzzQuil for additional support.  Return if not improved.        Return in about 6 months (around 10/16/2022) for transfer appointment .  [name], MD

## 2022-04-15 NOTE — Patient Instructions (Addendum)
Ears - drops for the inner ear - can use twice daily as needed, if using for >2 weeks without improvement or more than 2 weeks per month update - external ear - use cream   Over the counter - melatonin 3-5 mg, 30-60 minutes before bed - Can also try Zzzquil too  Keep an eye out for the update about physician   Sleep hygiene checklist: 1. Avoid naps during the day 2. Avoid stimulants such as caffeine and nicotine. Avoid bedtime alcohol (it can speed onset of sleep but the body's metabolism can cause awakenings). At least 2 hours before bedtime 3. All forms of exercise help ensure sound sleep - limit vigorous exercise to morning or late afternoon 4. Avoid food too close to bedtime including chocolate (which contains caffeine) 5. Soak up natural light 6. Establish regular bedtime routine. 7. Associate bed with sleep - avoid TV, computer or phone, reading while in bed. 8. Ensure pleasant, relaxing sleep environment - quiet, dark, cool room.  Good Sleep Hygiene Habits -- Got to bed and wake up within an hour of the same time every day -- Avoid bright screens (from laptop, phone, TV) within at least 30 minutes before bed. The "blue light" supresses the sleep hormone melatonin and the content may stimulate as well -- Maintain a quiet and dark sleep environment (blackout curtains, turn on a fan or white noise to block out disruptive sounds) -- Practicing relaxing activites before bed (taking a shower, reading a book, journaling, meditation app) -- To quiet a busy mind -- consider journaling before bed (jotting down reminders, worry thoughts, as well as positive things like a gratitude list)   Begin a Mindfulness/Meditation practice -- this can take a little as 3 minutes -- You can find resources in books -- Or you can download apps like  ---- Headspace App (which currently has free content called "Weathering the Storm") ---- Calm (which has a few free options)  ---- Insignt Timer ----  Stop, Breathe & Think  # With each of these Apps - you should decline the "start free trial" offer and as you search through the App should be able to access some of their free content. You can also chose to pay for the content if you find one that works well for you.   # Many of them also offer sleep specific content which may help with insomnia

## 2022-04-15 NOTE — Assessment & Plan Note (Signed)
Not well controlled, at this time he does not want to dramatically change medication but feels trazodone is not as effective as it used to be.  Need trazodone 200 mg.  Asked okay to try melatonin and/or ZzzQuil for additional support.  Return if not improved.

## 2022-04-15 NOTE — Assessment & Plan Note (Signed)
Discussed trial of triamcinolone for outer ear and hydrocortisone for inner ear. If no improvement update and may need ENT support.

## 2022-04-15 NOTE — Assessment & Plan Note (Signed)
PHQ-9 slightly elevated today in the setting of the recent loss of a best friend, and his wife recently hospitalized.  Overall he feels his depression is stable and controlled.  Continue Wellbutrin 300 mg, Cymbalta 60 mg.

## 2022-04-16 NOTE — Telephone Encounter (Signed)
Pt being seen and treated with Jerl Mina, PT at [facility] location.   Nothing further needed.

## 2022-04-17 ENCOUNTER — Ambulatory Visit: Payer: Medicare HMO | Admitting: Physical Therapy

## 2022-04-18 ENCOUNTER — Telehealth: Payer: Self-pay | Admitting: Physical Therapy

## 2022-04-18 NOTE — Telephone Encounter (Signed)
Physical therapist left message re: PT session and to confirm for next week's appt.

## 2022-04-22 ENCOUNTER — Encounter: Payer: Self-pay | Admitting: Internal Medicine

## 2022-04-22 ENCOUNTER — Ambulatory Visit: Payer: Medicare HMO | Admitting: Internal Medicine

## 2022-04-22 VITALS — BP 112/76 | HR 57 | Temp 97.8°F | Ht 71.0 in | Wt 330.8 lb

## 2022-04-22 DIAGNOSIS — R0602 Shortness of breath: Secondary | ICD-10-CM

## 2022-04-22 NOTE — Patient Instructions (Addendum)
Please schedule follow up scheduled with myself in 2 months.  If my schedule is not open yet, we will contact you with a reminder closer to that time. Please call [phone] if you haven't heard from [place] a month before.   Before your next visit I would like you to have: Echocardiogram - ultrasound of your heart. We will schedule this and call you.  I will follow up on your CPAP download and call you if any changes need to be made.   Your pulmonary function testing was normal. I think your shortness of breath is less related to lungs and more related to other issues including your sleep apnea and maybe even your heart.
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
## 2022-04-23 ENCOUNTER — Encounter: Payer: Self-pay | Admitting: Internal Medicine

## 2022-04-24 ENCOUNTER — Ambulatory Visit: Payer: Medicare HMO | Admitting: Physical Therapy

## 2022-04-24 DIAGNOSIS — R278 Other lack of coordination: Secondary | ICD-10-CM | POA: Diagnosis not present

## 2022-04-24 DIAGNOSIS — R2689 Other abnormalities of gait and mobility: Secondary | ICD-10-CM

## 2022-04-24 DIAGNOSIS — M533 Sacrococcygeal disorders, not elsewhere classified: Secondary | ICD-10-CM | POA: Diagnosis not present

## 2022-04-24 DIAGNOSIS — N3941 Urge incontinence: Secondary | ICD-10-CM | POA: Diagnosis not present

## 2022-04-24 NOTE — Patient Instructions (Signed)
___  Lengthen Back rib by R  shoulder   ( winging)    Lie on L  side , pillow between knees and under head  Pull  R arm overhead over mattress, grab the edge of mattress,pull it upward, drawing elbow away from ears  Breathing 10 reps  Brushing arm with 3/4 turn onto pillow behind back  Lying on L  side ,Pillow/ Block between knees     dragging top forearm across ribs below breast rotating 3/4 turn,  rotating  _R_ only this week  and then back to other palm , maintain top palm on body whole top and not lift shoulder  __  On your back   Breathing at ribs, inhale 1-2 pause Exhale 2-1 pause   2 reps   Inhale 1-2 pause  Exhale 3-2-1 pause   2 reps   For calm
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
## 2022-05-02 ENCOUNTER — Ambulatory Visit: Payer: Medicare HMO | Attending: Urology | Admitting: Physical Therapy

## 2022-05-02 DIAGNOSIS — M533 Sacrococcygeal disorders, not elsewhere classified: Secondary | ICD-10-CM | POA: Insufficient documentation

## 2022-05-02 DIAGNOSIS — R278 Other lack of coordination: Secondary | ICD-10-CM | POA: Diagnosis not present

## 2022-05-02 DIAGNOSIS — R2689 Other abnormalities of gait and mobility: Secondary | ICD-10-CM | POA: Diagnosis not present
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
## 2022-05-07 ENCOUNTER — Other Ambulatory Visit (HOSPITAL_COMMUNITY): Payer: Medicare HMO

## 2022-05-08 ENCOUNTER — Ambulatory Visit: Payer: Medicare HMO | Admitting: Physical Therapy

## 2022-05-08 DIAGNOSIS — R278 Other lack of coordination: Secondary | ICD-10-CM | POA: Diagnosis not present

## 2022-05-08 DIAGNOSIS — M533 Sacrococcygeal disorders, not elsewhere classified: Secondary | ICD-10-CM

## 2022-05-08 DIAGNOSIS — R2689 Other abnormalities of gait and mobility: Secondary | ICD-10-CM | POA: Diagnosis not present
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
## 2022-05-10 NOTE — Telephone Encounter (Signed)
Pt has been treated by [facility]

## 2022-05-13 ENCOUNTER — Other Ambulatory Visit: Payer: Self-pay | Admitting: Cardiology

## 2022-05-15 ENCOUNTER — Ambulatory Visit: Payer: Medicare HMO | Admitting: Physical Therapy

## 2022-05-15 DIAGNOSIS — M533 Sacrococcygeal disorders, not elsewhere classified: Secondary | ICD-10-CM

## 2022-05-15 DIAGNOSIS — R2689 Other abnormalities of gait and mobility: Secondary | ICD-10-CM | POA: Diagnosis not present

## 2022-05-15 DIAGNOSIS — R278 Other lack of coordination: Secondary | ICD-10-CM | POA: Diagnosis not present
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
## 2022-05-15 NOTE — Patient Instructions (Signed)
Lying on back, knees bent    band under ballmounds  while laying on back w/ knees bent  "W" exercise  30 reps   Band is placed under feet, knees bent, feet are hip width apart Hold band with thumbs point out, keep upper arm and elbow touching the bed the whole time  - inhale and then exhale pull bands by bending elbows hands move in a "w"  (feel shoulder blades squeezing)    __________________________  Oblique/ scapula stabilization   Opposite arm   Place band in "U"    band under ballmounds  while laying on back w/ knees bent     30 reps  on each side  Holding band from opposite thigh,  Inhale,    exhale then pull band across body while keeping elbow , shoulders, back of the head pressed down    ______________  Multifidis twist   Band is on doorknob: sit facing perpendicular to door , sit halfway towards front of chair, firm through 4 points of contact at buttocks and feet. Feet are placed hip with apart.   Twisting trunk without moving the hips and knees Hold band at the level of ribcage, elbows bent,shoulder blades roll back and down like squeezing a pencil under armpit   Exhale twist,.10-15 deg away from door without moving your hips/ knees, press more weight on the side of the sitting bones/ foot opp of your direction of turn as your counterweight. Continue to maintain equal weight through legs.  Keep knee unlocked.  30 reps

## 2022-05-24 ENCOUNTER — Ambulatory Visit: Payer: Medicare HMO | Attending: Internal Medicine

## 2022-05-24 DIAGNOSIS — R0602 Shortness of breath: Secondary | ICD-10-CM

## 2022-05-24 LAB — ECHOCARDIOGRAM COMPLETE
AR max vel: 4.7 cm2
AV Area VTI: 5.45 cm2
AV Area mean vel: 5.03 cm2
AV Mean grad: 2 mmHg
AV Peak grad: 3.3 mmHg
Ao pk vel: 0.9 m/s
Area-P 1/2: 3.48 cm2
S' Lateral: 3.5 cm

## 2022-06-10 DIAGNOSIS — Z7982 Long term (current) use of aspirin: Secondary | ICD-10-CM | POA: Diagnosis not present

## 2022-06-10 DIAGNOSIS — H02135 Senile ectropion of left lower eyelid: Secondary | ICD-10-CM | POA: Diagnosis not present

## 2022-06-10 DIAGNOSIS — H02112 Cicatricial ectropion of right lower eyelid: Secondary | ICD-10-CM | POA: Diagnosis not present

## 2022-06-10 DIAGNOSIS — H16213 Exposure keratoconjunctivitis, bilateral: Secondary | ICD-10-CM | POA: Diagnosis not present

## 2022-06-10 DIAGNOSIS — Z880 Allergy status to penicillin: Secondary | ICD-10-CM | POA: Diagnosis not present
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
## 2022-06-18 ENCOUNTER — Encounter: Payer: Self-pay | Admitting: Cardiology

## 2022-06-20 ENCOUNTER — Ambulatory Visit (INDEPENDENT_AMBULATORY_CARE_PROVIDER_SITE_OTHER): Payer: Medicare HMO | Admitting: Internal Medicine

## 2022-06-20 ENCOUNTER — Encounter (INDEPENDENT_AMBULATORY_CARE_PROVIDER_SITE_OTHER): Payer: Self-pay | Admitting: Internal Medicine

## 2022-06-20 VITALS — BP 121/81 | HR 56 | Temp 98.1°F | Ht 70.0 in | Wt 322.4 lb

## 2022-06-20 DIAGNOSIS — E782 Mixed hyperlipidemia: Secondary | ICD-10-CM

## 2022-06-20 DIAGNOSIS — Z6841 Body Mass Index (BMI) 40.0 and over, adult: Secondary | ICD-10-CM | POA: Diagnosis not present

## 2022-06-20 DIAGNOSIS — I10 Essential (primary) hypertension: Secondary | ICD-10-CM

## 2022-06-20 DIAGNOSIS — G4733 Obstructive sleep apnea (adult) (pediatric): Secondary | ICD-10-CM

## 2022-06-20 DIAGNOSIS — Z0289 Encounter for other administrative examinations: Secondary | ICD-10-CM

## 2022-06-20 DIAGNOSIS — Z9989 Dependence on other enabling machines and devices: Secondary | ICD-10-CM
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
## 2022-06-24 ENCOUNTER — Encounter: Payer: Self-pay | Admitting: Internal Medicine

## 2022-06-24 ENCOUNTER — Ambulatory Visit: Payer: Medicare HMO | Admitting: Internal Medicine

## 2022-06-24 VITALS — BP 114/64 | HR 60 | Temp 97.7°F | Ht 70.0 in | Wt 328.0 lb

## 2022-06-24 DIAGNOSIS — R0602 Shortness of breath: Secondary | ICD-10-CM

## 2022-06-24 DIAGNOSIS — G4733 Obstructive sleep apnea (adult) (pediatric): Secondary | ICD-10-CM
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
## 2022-06-24 NOTE — Patient Instructions (Signed)
Please schedule follow up scheduled with myself in 1 year.  If my schedule is not open yet, we will contact you with a reminder closer to that time. Please call [phone] if you haven't heard from [place] a month before.   Continue the CPAP therapy - it seems to be helping.   Follow up with weight management clinic and cardiology.   I am happy to see you back sooner if something changes. Otherwise we will touch base annually

## 2022-06-26 ENCOUNTER — Ambulatory Visit: Payer: Medicare HMO | Attending: Cardiology | Admitting: Cardiology

## 2022-06-26 ENCOUNTER — Encounter: Payer: Self-pay | Admitting: Cardiology

## 2022-06-26 VITALS — BP 144/94 | HR 62 | Ht 70.0 in | Wt 324.8 lb

## 2022-06-26 DIAGNOSIS — I10 Essential (primary) hypertension: Secondary | ICD-10-CM | POA: Diagnosis not present

## 2022-06-26 DIAGNOSIS — I2510 Atherosclerotic heart disease of native coronary artery without angina pectoris: Secondary | ICD-10-CM | POA: Diagnosis not present

## 2022-06-26 DIAGNOSIS — E782 Mixed hyperlipidemia: Secondary | ICD-10-CM

## 2022-06-26 DIAGNOSIS — R0602 Shortness of breath: Secondary | ICD-10-CM

## 2022-06-26 NOTE — Patient Instructions (Signed)
  Follow-Up: At [facility], you and your health needs are our priority.  As part of our continuing mission to provide you with exceptional heart care, we have created designated Provider Care Teams.  These Care Teams include your primary Cardiologist (physician) and Advanced Practice Providers (APPs -  Physician Assistants and Nurse Practitioners) who all work together to provide you with the care you need, when you need it.  We recommend signing up for the patient portal called "MyChart".  Sign up information is provided on this After Visit Summary.  MyChart is used to connect with patients for Virtual Visits (Telemedicine).  Patients are able to view lab/test results, encounter notes, upcoming appointments, etc.  Non-urgent messages can be sent to your provider as well.   To learn more about what you can do with MyChart, go to [email or web address].    Your next appointment:   12 month(s)  The format for your next appointment:   In Person  Provider:   Brian Crenshaw, MD   

## 2022-07-09 ENCOUNTER — Encounter (INDEPENDENT_AMBULATORY_CARE_PROVIDER_SITE_OTHER): Payer: Self-pay | Admitting: Internal Medicine

## 2022-07-09 ENCOUNTER — Ambulatory Visit (INDEPENDENT_AMBULATORY_CARE_PROVIDER_SITE_OTHER): Payer: Medicare HMO | Admitting: Internal Medicine

## 2022-07-09 VITALS — BP 110/71 | HR 64 | Temp 97.5°F | Ht 70.0 in | Wt 320.0 lb

## 2022-07-09 DIAGNOSIS — E669 Obesity, unspecified: Secondary | ICD-10-CM

## 2022-07-09 DIAGNOSIS — R5383 Other fatigue: Secondary | ICD-10-CM

## 2022-07-09 DIAGNOSIS — G4733 Obstructive sleep apnea (adult) (pediatric): Secondary | ICD-10-CM | POA: Diagnosis not present

## 2022-07-09 DIAGNOSIS — Z1331 Encounter for screening for depression: Secondary | ICD-10-CM

## 2022-07-09 DIAGNOSIS — R0602 Shortness of breath: Secondary | ICD-10-CM | POA: Diagnosis not present

## 2022-07-09 DIAGNOSIS — I10 Essential (primary) hypertension: Secondary | ICD-10-CM | POA: Diagnosis not present

## 2022-07-09 DIAGNOSIS — Z6841 Body Mass Index (BMI) 40.0 and over, adult: Secondary | ICD-10-CM

## 2022-07-09 DIAGNOSIS — E782 Mixed hyperlipidemia: Secondary | ICD-10-CM

## 2022-07-10 ENCOUNTER — Encounter (INDEPENDENT_AMBULATORY_CARE_PROVIDER_SITE_OTHER): Payer: Self-pay | Admitting: Internal Medicine

## 2022-07-10 LAB — CMP14+EGFR
ALT: 18 IU/L (ref 0–44)
AST: 17 IU/L (ref 0–40)
Albumin/Globulin Ratio: 1.5 (ref 1.2–2.2)
Albumin: 4 g/dL (ref 3.9–4.9)
Alkaline Phosphatase: 104 IU/L (ref 44–121)
BUN/Creatinine Ratio: 15 (ref 10–24)
BUN: 18 mg/dL (ref 8–27)
Bilirubin Total: 0.3 mg/dL (ref 0.0–1.2)
CO2: 22 mmol/L (ref 20–29)
Calcium: 8.9 mg/dL (ref 8.6–10.2)
Chloride: 103 mmol/L (ref 96–106)
Creatinine, Ser: 1.22 mg/dL (ref 0.76–1.27)
Globulin, Total: 2.7 g/dL (ref 1.5–4.5)
Glucose: 79 mg/dL (ref 70–99)
Potassium: 4.4 mmol/L (ref 3.5–5.2)
Sodium: 144 mmol/L (ref 134–144)
Total Protein: 6.7 g/dL (ref 6.0–8.5)
eGFR: 65 mL/min/{1.73_m2} (ref >59)

## 2022-07-10 LAB — VITAMIN B12: Vitamin B-12: 224 pg/mL — ABNORMAL LOW (ref 232–1245)

## 2022-07-10 LAB — LIPID PANEL WITH LDL/HDL RATIO
Cholesterol, Total: 82 mg/dL — ABNORMAL LOW (ref 100–199)
HDL: 28 mg/dL — ABNORMAL LOW (ref >39)
LDL Chol Calc (NIH): 33 mg/dL (ref 0–99)
LDL/HDL Ratio: 1.2 ratio (ref 0.0–3.6)
Triglycerides: 110 mg/dL (ref 0–149)
VLDL Cholesterol Cal: 21 mg/dL (ref 5–40)

## 2022-07-10 LAB — HEMOGLOBIN A1C
Est. average glucose Bld gHb Est-mCnc: 108 mg/dL
Hgb A1c MFr Bld: 5.4 % (ref 4.8–5.6)

## 2022-07-10 LAB — VITAMIN D 25 HYDROXY (VIT D DEFICIENCY, FRACTURES): Vit D, 25-Hydroxy: 18.2 ng/mL — ABNORMAL LOW (ref 30.0–100.0)

## 2022-07-10 LAB — INSULIN, RANDOM: INSULIN: 35.5 u[IU]/mL — ABNORMAL HIGH (ref 2.6–24.9)

## 2022-07-10 LAB — TSH: TSH: 4.26 u[IU]/mL (ref 0.450–4.500)

## 2022-07-10 NOTE — Telephone Encounter (Signed)
Please advise 

## 2022-07-23 NOTE — Progress Notes (Unsigned)
    Chief Complaint:   OBESITY [name] (MR# [id number]) is a 66 y.o. male who presents for evaluation and treatment of obesity and related comorbidities. Current BMI is Body mass index is 45.92 kg/m. [name] has been struggling with his weight for many years and has been unsuccessful in either losing weight, maintaining weight loss, or reaching his healthy weight goal.  [name] is accompanied by his wife [name] today.   [name] is currently in the action stage of change and ready to dedicate time achieving and maintaining a healthier weight. [name] is interested in becoming our patient and working on intensive lifestyle modifications including (but not limited to) diet and exercise for weight loss.  [name]'s habits were reviewed today and are as follows: His family eats meals together, he thinks his family will eat healthier with him, his desired weight loss is 90 lbs, he has been heavy most of his life, he started gaining weight in 2003, his heaviest weight ever was 328 pounds, he has significant food cravings issues, he skips meals frequently, he is frequently drinking liquids with calories, he frequently eats larger portions than normal, and he struggles with emotional eating.  Depression Screen [name]'s Food and Mood (modified PHQ-9) score was 18.  Subjective:   1. Other fatigue [name] admits to daytime somnolence and denies waking up still tired. Patient has a history of symptoms of daytime fatigue. [name] generally gets 8 or 9 hours of sleep per night, and states that he has generally restful sleep. Snoring is present. Apneic episodes are present. Epworth Sleepiness Score is 4.   2. SOB (shortness of breath) on exertion [name] notes increasing shortness of breath with exercising and seems to be worsening over time with weight gain. He notes getting out of breath sooner with activity than he used to. This has not gotten worse recently. [name] denies shortness of breath at rest or  orthopnea.  3. Essential hypertension [name]'s blood pressure is well controlled on amlodipine and metoprolol.   4. Obstructive sleep apnea [name] is on CPAP with good compliance. He has a low Epworth score.   5. Mixed hyperlipidemia [name] is on atorvastatin with no side effects. He has a history of ASCVD and angina.   Assessment/Plan:   1. Other fatigue [name] does feel that his weight is causing his energy to be lower than it should be. Fatigue may be related to obesity, depression or many other causes. Labs will be ordered, and in the meanwhile, [name] will focus on self care including making healthy food choices, increasing physical activity and focusing on stress reduction.  - Vitamin B12  2. SOB (shortness of breath) on exertion [name] does feel that he gets out of breath more easily that he used to when he exercises. [name]'s shortness of breath appears to be obesity related and exercise induced. He has agreed to work on weight loss and gradually increase exercise to treat his exercise induced shortness of breath. Will continue to monitor closely.  3. Essential hypertension We will check labs today. [name] will continue his current medication regimen.   - CMP14+EGFR  4. Obstructive sleep apnea [name] will continue his CPAP. He will work on his weight loss therapy with a weight loss goal of 15% to improve his condition.   5. Mixed hyperlipidemia We will check labs today. [name] will continue his statin.   - Lipid Panel With LDL/HDL Ratio  6. Depression screening [name] had a positive depression screening. Depression is commonly associated with obesity   and often results in emotional eating behaviors. We will monitor this closely and work on CBT to help improve the non-hunger eating patterns. Referral to Psychology may be required if no improvement is seen as he continues in our clinic.  7. Obesity, current BMI 64 [name] is currently in the action stage of change and his goal is to  continue with weight loss efforts. I recommend [name] begin the structured treatment plan as follows:  He has agreed to the Category 4 Plan.  We will check labs today.   - Hemoglobin A1c - Insulin, random - TSH - VITAMIN D 25 Hydroxy (Vit-D Deficiency, Fractures)  Exercise goals: All adults should avoid inactivity. Some physical activity is better than none, and adults who participate in any amount of physical activity gain some health benefits.   Behavioral modification strategies: increasing lean protein intake, decreasing simple carbohydrates, increasing vegetables, increasing water intake, decreasing liquid calories, increasing high fiber foods, no skipping meals, meal planning and cooking strategies, keeping healthy foods in the home, better snacking choices, avoiding temptations, and planning for success.  He was informed of the importance of frequent follow-up visits to maximize his success with intensive lifestyle modifications for his multiple health conditions. He was informed we would discuss his lab results at his next visit unless there is a critical issue that needs to be addressed sooner. [name] agreed to keep his next visit at the agreed upon time to discuss these results.  Objective:   Blood pressure 110/71, pulse 64, temperature (!) 97.5 F (36.4 C), height 5' 10" (1.778 m), weight (!) 320 lb (145.2 kg), SpO2 97 %. Body mass index is 45.92 kg/m.  EKG: Normal sinus rhythm, rate 69 BPM.  Indirect Calorimeter completed today shows a VO2 of 344 and a REE of 2376.  His calculated basal metabolic rate is 2563 thus his basal metabolic rate is worse than expected.  General: Cooperative, alert, well developed, in no acute distress. HEENT: Conjunctivae and lids unremarkable. Cardiovascular: Regular rhythm.  Lungs: Normal work of breathing. Neurologic: No focal deficits.   Lab Results  Component Value Date   CREATININE 1.22 07/09/2022   BUN 18 07/09/2022   NA 144 07/09/2022    K 4.4 07/09/2022   CL 103 07/09/2022   CO2 22 07/09/2022   Lab Results  Component Value Date   ALT 18 07/09/2022   AST 17 07/09/2022   ALKPHOS 104 07/09/2022   BILITOT 0.3 07/09/2022   Lab Results  Component Value Date   HGBA1C 5.4 07/09/2022   Lab Results  Component Value Date   INSULIN 35.5 (H) 07/09/2022   Lab Results  Component Value Date   TSH 4.260 07/09/2022   Lab Results  Component Value Date   CHOL 82 (L) 07/09/2022   HDL 28 (L) 07/09/2022   LDLCALC 33 07/09/2022   TRIG 110 07/09/2022   CHOLHDL 3 11/15/2021   Lab Results  Component Value Date   WBC 6.8 01/08/2022   HGB 13.6 01/08/2022   HCT 42.6 01/08/2022   MCV 87.1 01/08/2022   PLT 226 01/08/2022   No results found for: "IRON", "TIBC", "FERRITIN"  Attestation Statements:   Reviewed by clinician on day of visit: allergies, medications, problem list, medical history, surgical history, family history, social history, and previous encounter notes.  Time spent on visit including pre-visit chart review and post-visit charting and care was 40 minutes.   Wilhemena Durie, am acting as transcriptionist for Thomes Dinning, MD.  I have reviewed the  above documentation for accuracy and completeness, and I agree with the above. -Edgardo Maldonado, MD  

## 2022-07-24 ENCOUNTER — Encounter (INDEPENDENT_AMBULATORY_CARE_PROVIDER_SITE_OTHER): Payer: Self-pay | Admitting: Internal Medicine

## 2022-07-24 ENCOUNTER — Ambulatory Visit (INDEPENDENT_AMBULATORY_CARE_PROVIDER_SITE_OTHER): Payer: Medicare HMO | Admitting: Internal Medicine

## 2022-07-24 VITALS — BP 130/84 | HR 57 | Temp 97.5°F | Ht 70.0 in | Wt 312.0 lb

## 2022-07-24 DIAGNOSIS — E538 Deficiency of other specified B group vitamins: Secondary | ICD-10-CM

## 2022-07-24 DIAGNOSIS — E559 Vitamin D deficiency, unspecified: Secondary | ICD-10-CM | POA: Diagnosis not present

## 2022-07-24 DIAGNOSIS — I2510 Atherosclerotic heart disease of native coronary artery without angina pectoris: Secondary | ICD-10-CM | POA: Diagnosis not present

## 2022-07-24 DIAGNOSIS — Z6841 Body Mass Index (BMI) 40.0 and over, adult: Secondary | ICD-10-CM

## 2022-07-24 DIAGNOSIS — I10 Essential (primary) hypertension: Secondary | ICD-10-CM

## 2022-07-24 DIAGNOSIS — E669 Obesity, unspecified: Secondary | ICD-10-CM | POA: Diagnosis not present

## 2022-07-24 MED ORDER — VITAMIN D (ERGOCALCIFEROL) 1.25 MG (50000 UNIT) PO CAPS: 50000.0000 [IU] | ORAL_CAPSULE | ORAL | 0 refills | Status: DC

## 2022-07-24 MED ORDER — VITAMIN B-12 1000 MCG PO TABS: 1000.0000 ug | ORAL_TABLET | Freq: Every day | ORAL | 0 refills | Status: AC

## 2022-07-26 ENCOUNTER — Other Ambulatory Visit: Payer: Self-pay | Admitting: Cardiology

## 2022-08-06 NOTE — Progress Notes (Signed)
Chief Complaint:   OBESITY [name] is here to discuss his progress with his obesity treatment plan along with follow-up of his obesity related diagnoses. [name] is on the Category 4 Plan and states he is following his eating plan approximately 100% of the time. [name] states he is doing 0 minutes 0 times per week.  Today's visit was #: 2 Starting weight: 320 lbs Starting date: 07/09/2022 Today's weight: 312 lbs Today's date: 07/24/2022 Total lbs lost to date: 8 Total lbs lost since last in-office visit: 8  Interim History: [name] is doing  well. He has implemented the meal plan and he has decreased consumption of simple carbohydrates. He is also watching his portion sizes. He reports adequate satiety and satiation. His first week was difficult from reducing carbohydrates but he is ok with not craving and wanting anymore. He notes increased energy and improvement in knee pain.   Subjective:   1. B12 deficiency [name]'s B12 level is 224. He was counseled on deficiency state. I discussed labs with the patient today.   2. Vitamin D deficiency [name]'s Vitamin D level is 18.2. He is currently taking  multivitamins OTC .  Associated with excess adiposity and may result in leptin resistance and adipogenesis.  He denies nausea, vomiting or muscle weakness. I discussed labs with the patient today.   3. Essential hypertension [name]'s blood pressure is well controlled. He is on amlodipine and metoprolol.   4. Coronary artery disease Reviewed cath, non obstructive, MVD. [name] is asymptomatic at present. He is on APT, NTG, BB, and statin. Reviewed LDL at 33. I discussed labs with the patient today.   Assessment/Plan:   1. B12 deficiency [name] agreed to start B12 1,000 mcg daily, with a 90 day supply. We will recheck MMA and B12 in 4 weeks.   - cyanocobalamin (VITAMIN B12) 1000 MCG tablet; Take 1 tablet (1,000 mcg total) by mouth daily.  Dispense: 90 tablet; Refill: 0  2. Vitamin D  deficiency [name] agreed to start prescription Vitamin D 50,000 IU every week, with a 90 days supply. Goal Vitamin D level of 50-60.  - Vitamin D, Ergocalciferol, (DRISDOL) 1.25 MG (50000 UNIT) CAPS capsule; Take 1 capsule (50,000 Units total) by mouth every 7 (seven) days.  Dispense: 13 capsule; Refill: 0  3. Essential hypertension [name] will continue working on his weight loss therapy. Will monitor for orthostasis. He will continue his current medications.   4. Coronary artery disease [name] will continue statin, and he will start light physical activity at home.   5. Obesity with current BMI of 44.9 [name] is currently in the action stage of change. As such, his goal is to continue with weight loss efforts. He has agreed to the Category 4 Plan.   Exercise goals: We discussed walking with his wife and dog 2-3 times per week at least 10 minutes. He will look at chair exercises on YouTube.   Behavioral modification strategies: increasing lean protein intake, decreasing simple carbohydrates, increasing water intake, no skipping meals, meal planning and cooking strategies, keeping healthy foods in the home, avoiding temptations, and planning for success.  [name] has agreed to follow-up with our clinic in 2 to 3 weeks. He was informed of the importance of frequent follow-up visits to maximize his success with intensive lifestyle modifications for his multiple health conditions.   Objective:   Blood pressure 130/84, pulse (!) 57, temperature (!) 97.5 F (36.4 C), height '5\' 10"'$  (1.778 m), weight (!) 312 lb (141.5 kg),  SpO2 95 %. Body mass index is 44.77 kg/m.  General: Cooperative, alert, well developed, in no acute distress. HEENT: Conjunctivae and lids unremarkable. Cardiovascular: Regular rhythm.  Lungs: Normal work of breathing. Neurologic: No focal deficits.   Lab Results  Component Value Date   CREATININE 1.22 07/09/2022   BUN 18 07/09/2022   NA 144 07/09/2022   K 4.4 07/09/2022    CL 103 07/09/2022   CO2 22 07/09/2022   Lab Results  Component Value Date   ALT 18 07/09/2022   AST 17 07/09/2022   ALKPHOS 104 07/09/2022   BILITOT 0.3 07/09/2022   Lab Results  Component Value Date   HGBA1C 5.4 07/09/2022   Lab Results  Component Value Date   INSULIN 35.5 (H) 07/09/2022   Lab Results  Component Value Date   TSH 4.260 07/09/2022   Lab Results  Component Value Date   CHOL 82 (L) 07/09/2022   HDL 28 (L) 07/09/2022   LDLCALC 33 07/09/2022   TRIG 110 07/09/2022   CHOLHDL 3 11/15/2021   Lab Results  Component Value Date   VD25OH 18.2 (L) 07/09/2022   Lab Results  Component Value Date   WBC 6.8 01/08/2022   HGB 13.6 01/08/2022   HCT 42.6 01/08/2022   MCV 87.1 01/08/2022   PLT 226 01/08/2022   No results found for: "IRON", "TIBC", "FERRITIN"  Attestation Statements:   Reviewed by clinician on day of visit: allergies, medications, problem list, medical history, surgical history, family history, social history, and previous encounter notes.  Time spent on visit including pre-visit chart review and post-visit care and charting was 40 minutes.   Wilhemena Durie, am acting as transcriptionist for Thomes Dinning, MD.  I have reviewed the above documentation for accuracy and completeness, and I agree with the above. -Thomes Dinning, MD

## 2022-08-08 ENCOUNTER — Ambulatory Visit (INDEPENDENT_AMBULATORY_CARE_PROVIDER_SITE_OTHER): Payer: Medicare HMO | Admitting: Internal Medicine

## 2022-08-08 ENCOUNTER — Encounter (INDEPENDENT_AMBULATORY_CARE_PROVIDER_SITE_OTHER): Payer: Self-pay | Admitting: Internal Medicine

## 2022-08-08 ENCOUNTER — Encounter: Payer: Medicare HMO | Admitting: Internal Medicine

## 2022-08-08 VITALS — BP 112/78 | HR 58 | Temp 97.6°F | Ht 70.0 in | Wt 311.0 lb

## 2022-08-08 DIAGNOSIS — E559 Vitamin D deficiency, unspecified: Secondary | ICD-10-CM | POA: Diagnosis not present

## 2022-08-08 DIAGNOSIS — E669 Obesity, unspecified: Secondary | ICD-10-CM | POA: Diagnosis not present

## 2022-08-08 DIAGNOSIS — Z6841 Body Mass Index (BMI) 40.0 and over, adult: Secondary | ICD-10-CM | POA: Diagnosis not present

## 2022-08-08 DIAGNOSIS — I10 Essential (primary) hypertension: Secondary | ICD-10-CM

## 2022-08-08 DIAGNOSIS — R531 Weakness: Secondary | ICD-10-CM | POA: Diagnosis not present

## 2022-08-08 DIAGNOSIS — E538 Deficiency of other specified B group vitamins: Secondary | ICD-10-CM

## 2022-08-08 NOTE — Progress Notes (Signed)
Chief Complaint:   OBESITY [name] is here to discuss his progress with his obesity treatment plan along with follow-up of his obesity related diagnoses. [name] is on the Category 4 Plan and states he is following his eating plan approximately 100% of the time. [name] states he is not currently exercising.  Today's visit was #: 3 Starting weight: 320 lbs Starting date: 07/09/2022 Today's weight: 311 lbs Today's date: 08/08/2022 Total lbs lost to date: 9 Total lbs lost since last in-office visit: 1  Interim History: [name] presents today for follow-up.  Adherence to nutritional plan is optimal.  He notes adequate satiety and satiation.  He also notes difficulty getting all the food in the plan.  He has been measuring his protein as well as vegetable portions.  He has been eating fish, [place] and chicken he sometimes will eat barbecue but not often he has eliminated pork sausage.  Snacks on yogurt, kind bar and also.  He has been reading labels.  He has not started physical activity and remains precontemplative.  He complains of fatigue and lassitude during the day.  He is on several CNS depressing drugs including trazodone and gabapentin.  He is drinking adequate amount of water.  He denies abnormal cravings or eating patterns.  He occasionally craves carbs.  He is taking his vitamin D and B12 without any side effects.  Subjective:   1. Vitamin D deficiency He is currently taking prescription vitamin D 50,000 IU each week. He denies nausea, vomiting or muscle weakness.  2. B12 deficiency [name] is on OTC B12 supplementation with no side effects.  3. Essential hypertension BP well controlled. Patient is on amlodipine and metoprolol.  4. Lassitude New. Multifactorial. Comorbid condition and CNS depressing drugs.  Assessment/Plan:   1. Vitamin D deficiency Low Vitamin D level contributes to fatigue and are associated with obesity, breast, and colon cancer. He agrees to continue to take  prescription Vitamin D 50,000 IU every week and will follow-up for routine testing of Vitamin D, at least 2-3 times per year to avoid over-replacement.  2. B12 deficiency Continue B12 supplementation and check levels at next office visit.  3. Essential hypertension Continue weight loss therapy. Monitor BP for orthostasis while losing weight.  4. Lassitude Patient will talk to PCP about Gabapentin and Trazodone. Both medications may also contribute to weight gain.  5. Obesity with current BMI of 44.7 Plan: We will reduce his caloric intake to 1500 cal from 1800.  He will transition to category 3 meal plan.  This was reviewed with patient and wife today.  He will talk to his primary care physician about trazodone and gabapentin which may be contributing to his lassitude and may also cause weight gain.  Exercise goals:  As is  Behavioral modification strategies: decreasing simple carbohydrates, no skipping meals, meal planning and cooking strategies, and avoiding temptations.  [name] has agreed to follow-up with our clinic in 3 weeks. He was informed of the importance of frequent follow-up visits to maximize his success with intensive lifestyle modifications for his multiple health conditions.   Objective:   Blood pressure 112/78, pulse (!) 58, temperature 97.6 F (36.4 C), height '5\' 10"'$  (1.778 m), weight (!) 311 lb (141.1 kg), SpO2 95 %. Body mass index is 44.62 kg/m.  General: Cooperative, alert, well developed, in no acute distress. HEENT: Conjunctivae and lids unremarkable. Cardiovascular: Regular rhythm.  Lungs: Normal work of breathing. Neurologic: No focal deficits.   Lab Results  Component Value  Date   CREATININE 1.22 07/09/2022   BUN 18 07/09/2022   NA 144 07/09/2022   K 4.4 07/09/2022   CL 103 07/09/2022   CO2 22 07/09/2022   Lab Results  Component Value Date   ALT 18 07/09/2022   AST 17 07/09/2022   ALKPHOS 104 07/09/2022   BILITOT 0.3 07/09/2022   Lab Results   Component Value Date   HGBA1C 5.4 07/09/2022   Lab Results  Component Value Date   INSULIN 35.5 (H) 07/09/2022   Lab Results  Component Value Date   TSH 4.260 07/09/2022   Lab Results  Component Value Date   CHOL 82 (L) 07/09/2022   HDL 28 (L) 07/09/2022   LDLCALC 33 07/09/2022   TRIG 110 07/09/2022   CHOLHDL 3 11/15/2021   Lab Results  Component Value Date   VD25OH 18.2 (L) 07/09/2022   Lab Results  Component Value Date   WBC 6.8 01/08/2022   HGB 13.6 01/08/2022   HCT 42.6 01/08/2022   MCV 87.1 01/08/2022   PLT 226 01/08/2022   Attestation Statements:   Reviewed by clinician on day of visit: allergies, medications, problem list, medical history, surgical history, family history, social history, and previous encounter notes.  Time spent on visit including pre-visit chart review and post-visit care and charting was 20 minutes.   I, Kathlene November, BS, CMA, am acting as transcriptionist for Thomes Dinning, MD.  I have reviewed the above documentation for accuracy and completeness, and I agree with the above. -Thomes Dinning, MD

## 2022-08-15 ENCOUNTER — Other Ambulatory Visit: Payer: Self-pay | Admitting: Cardiology

## 2022-08-16 ENCOUNTER — Ambulatory Visit (HOSPITAL_BASED_OUTPATIENT_CLINIC_OR_DEPARTMENT_OTHER): Payer: Medicare HMO | Admitting: Orthopaedic Surgery

## 2022-08-16 DIAGNOSIS — M1711 Unilateral primary osteoarthritis, right knee: Secondary | ICD-10-CM | POA: Diagnosis not present

## 2022-08-16 DIAGNOSIS — M1712 Unilateral primary osteoarthritis, left knee: Secondary | ICD-10-CM

## 2022-08-16 DIAGNOSIS — M170 Bilateral primary osteoarthritis of knee: Secondary | ICD-10-CM | POA: Diagnosis not present

## 2022-08-16 MED ORDER — TRIAMCINOLONE ACETONIDE 40 MG/ML IJ SUSP
80.0000 mg | INTRAMUSCULAR | Status: AC | PRN
  Administered 2022-08-16: 80 mg via INTRA_ARTICULAR

## 2022-08-16 MED ORDER — LIDOCAINE HCL 1 % IJ SOLN
4.0000 mL | INTRAMUSCULAR | Status: AC | PRN
  Administered 2022-08-16: 4 mL
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
## 2022-08-20 DIAGNOSIS — H0259 Other disorders affecting eyelid function: Secondary | ICD-10-CM | POA: Diagnosis not present

## 2022-08-27 ENCOUNTER — Encounter (INDEPENDENT_AMBULATORY_CARE_PROVIDER_SITE_OTHER): Payer: Self-pay | Admitting: Internal Medicine

## 2022-08-27 ENCOUNTER — Ambulatory Visit (INDEPENDENT_AMBULATORY_CARE_PROVIDER_SITE_OTHER): Payer: Medicare HMO | Admitting: Internal Medicine

## 2022-08-27 VITALS — BP 116/79 | HR 68 | Temp 98.3°F | Ht 70.0 in | Wt 301.4 lb

## 2022-08-27 DIAGNOSIS — E669 Obesity, unspecified: Secondary | ICD-10-CM

## 2022-08-27 DIAGNOSIS — I10 Essential (primary) hypertension: Secondary | ICD-10-CM

## 2022-08-27 DIAGNOSIS — E538 Deficiency of other specified B group vitamins: Secondary | ICD-10-CM | POA: Diagnosis not present

## 2022-08-27 DIAGNOSIS — E559 Vitamin D deficiency, unspecified: Secondary | ICD-10-CM

## 2022-08-27 DIAGNOSIS — Z6841 Body Mass Index (BMI) 40.0 and over, adult: Secondary | ICD-10-CM | POA: Diagnosis not present

## 2022-08-27 NOTE — Progress Notes (Signed)
Chief Complaint:   OBESITY [name] is here to discuss his progress with his obesity treatment plan along with follow-up of his obesity related diagnoses. [name] is on the Category 3 Plan and states he is following his eating plan approximately 100% of the time. [name] states he is walking for 10-15 minutes 3 times per week.  Today's visit was #: 4 Starting weight: 320 lbs Starting date: 07/09/2022 Today's weight: 301 lbs Today's date: 08/27/2022 Total lbs lost to date: 19 Total lbs lost since last in-office visit: 10  Interim History: [name] presents today for a follow-up.  Since last office visit he has lost 10 pounds.  We had switched him to a 1500-calorie nutrition plan.  He reports good adherence to nutritional plan with good satiety and denies any difficulties.  He denies abnormal cravings or eating patterns.  He denies consumption of liquid proteins.  He is also doing a really good job with healthy snacking and also limiting portions to 100 to 200 cal.  He is no longer eating out of bags.  He reports physical activity levels are low.  His medications are unchanged he continues to require gabapentin for left hand pain.  Bioimpedance shows a reduction in body fat from 43% to 41%, his visceral fat rating is down from 31 to 29.  He is taking his vitamin B12 and vitamin D with good compliance and denies any adverse effects.  He had been found to be B12 and vitamin D deficient.  Subjective:   1. Essential hypertension Blood pressure is well controlled. He is on amlodipine, isosorbide, and metoprolol.   2. B12 deficiency He is currently taking B12 1,000 mcg daily.  3. Vitamin D deficiency He is currently taking prescription vitamin D 50,000 IU each week. He denies nausea, vomiting or muscle weakness.  Assessment/Plan:   1. Essential hypertension Continue weight loss therapy, monitor for orthostasis while losing weight. Continue current regimen.   2. B12 deficiency The diagnosis  was reviewed with the [name]. Counseling provided today, see below. We will continue to monitor. Orders and follow up as documented in [name] record.  Counseling The body needs vitamin B12: to make red blood cells; to make DNA; and to help the nerves work properly so they can carry messages from the brain to the body.  The main causes of vitamin B12 deficiency include dietary deficiency, digestive diseases, pernicious anemia, and having a surgery in which part of the stomach or small intestine is removed.  Certain medicines can make it harder for the body to absorb vitamin B12. These medicines include: heartburn medications; some antibiotics; some medications used to treat diabetes, gout, and high cholesterol.  In some cases, there are no symptoms of this condition. If the condition leads to anemia or nerve damage, various symptoms can occur, such as weakness or fatigue, shortness of breath, and numbness or tingling in your hands and feet.   Treatment:  May include taking vitamin B12 supplements.  Avoid alcohol.  Eat lots of healthy foods that contain vitamin B12: Beef, pork, chicken, [place], and organ meats, such as liver.  Seafood: This includes clams, rainbow trout, salmon, tuna, and haddock. Eggs.  Cereal and dairy products that are fortified: This means that vitamin B12 has been added to the food.   3. Vitamin D deficiency Low Vitamin D level contributes to fatigue and are associated with obesity, breast, and colon cancer. He agrees to continue to take prescription Vitamin D 50,000 IU every week and  will follow-up for routine testing of Vitamin D, at least 2-3 times per year to avoid over-replacement.  4. Obesity with current BMI of 43.2 [facility].  We demonstrated how to do lower extremity exercises without involving knees.  He has advanced osteoarthritis.  Goal would be to perform strengthening doing light weights about 8-12  repetitions large muscle groups 2-3 times a week.  We discussed doing chair exercises.  [name] is currently in the action stage of change. As such, his goal is to continue with weight loss efforts. He has agreed to the Category 3 Plan.   Exercise goals: Add strengthening 2-3 times a week.   Behavioral modification strategies: increasing lean protein intake, increasing water intake, no skipping meals, avoiding temptations, and planning for success.  [place] has agreed to follow-up with our clinic in 3 to 4 weeks. He was informed of the importance of frequent follow-up visits to maximize his success with intensive lifestyle modifications for his multiple health conditions.   Objective:   Blood pressure 116/79, pulse 68, temperature 98.3 F (36.8 C), height '5\' 10"'$  (1.778 m), weight (!) 301 lb 6.4 oz (136.7 kg), SpO2 94 %. Body mass index is 43.25 kg/m.  General: Cooperative, alert, well developed, in no acute distress. HEENT: Conjunctivae and lids unremarkable. Cardiovascular: Regular rhythm.  Lungs: Normal work of breathing. Neurologic: No focal deficits.   Lab Results  Component Value Date   CREATININE 1.22 07/09/2022   BUN 18 07/09/2022   NA 144 07/09/2022   K 4.4 07/09/2022   CL 103 07/09/2022   CO2 22 07/09/2022   Lab Results  Component Value Date   ALT 18 07/09/2022   AST 17 07/09/2022   ALKPHOS 104 07/09/2022   BILITOT 0.3 07/09/2022   Lab Results  Component Value Date   HGBA1C 5.4 07/09/2022   Lab Results  Component Value Date   INSULIN 35.5 (H) 07/09/2022   Lab Results  Component Value Date   TSH 4.260 07/09/2022   Lab Results  Component Value Date   CHOL 82 (L) 07/09/2022   HDL 28 (L) 07/09/2022   LDLCALC 33 07/09/2022   TRIG 110 07/09/2022   CHOLHDL 3 11/15/2021   Lab Results  Component Value Date   VD25OH 18.2 (L) 07/09/2022   Lab Results  Component Value Date   WBC 6.8 01/08/2022   HGB 13.6 01/08/2022   HCT 42.6 01/08/2022   MCV 87.1  01/08/2022   PLT 226 01/08/2022   No results found for: "IRON", "TIBC", "FERRITIN"  Attestation Statements:   Reviewed by clinician on day of visit: allergies, medications, problem list, medical history, surgical history, family history, social history, and previous encounter notes.   Wilhemena Durie, am acting as transcriptionist for Thomes Dinning, MD.  I have reviewed the above documentation for accuracy and completeness, and I agree with the above. -Thomes Dinning, MD

## 2022-08-30 ENCOUNTER — Ambulatory Visit (INDEPENDENT_AMBULATORY_CARE_PROVIDER_SITE_OTHER): Payer: Medicare HMO | Admitting: Nurse Practitioner

## 2022-08-30 ENCOUNTER — Encounter: Payer: Self-pay | Admitting: Nurse Practitioner

## 2022-08-30 VITALS — BP 124/82 | HR 57 | Ht 70.0 in | Wt 307.0 lb

## 2022-08-30 DIAGNOSIS — R051 Acute cough: Secondary | ICD-10-CM | POA: Insufficient documentation

## 2022-08-30 DIAGNOSIS — R0982 Postnasal drip: Secondary | ICD-10-CM

## 2022-08-30 DIAGNOSIS — J0100 Acute maxillary sinusitis, unspecified: Secondary | ICD-10-CM

## 2022-08-30 MED ORDER — DOXYCYCLINE HYCLATE 100 MG PO TABS: 100.0000 mg | ORAL_TABLET | Freq: Two times a day (BID) | ORAL | 0 refills | Status: AC

## 2022-08-30 MED ORDER — BENZONATATE 100 MG PO CAPS: 100.0000 mg | ORAL_CAPSULE | Freq: Three times a day (TID) | ORAL | 0 refills | Status: DC | PRN

## 2022-08-30 NOTE — Assessment & Plan Note (Signed)
Patient to start back on Nasonex nasal spray.  Rest drink plenty of fluid

## 2022-08-30 NOTE — Progress Notes (Signed)
Acute Office Visit  Subjective:     Patient ID: [name], male    DOB: [birth year], 67 y.o.   MRN: [id number]  Chief Complaint  Patient presents with   URI     Patient is in today for sick symptoms    Histoy of OSA, HLD, HTN, CAD, and former smoker who quit in 1986  Symptoms started last Thursday 08/22/2022 States that his sister was tested for strep throat and was negative and around for the holidays. States that his wife has the same thing and was evaluated yesterday here in [place] test at home was not done  Covid vaccine: [employer] x2 Flu vaccine: not up to date  Using delsym that is not helping    Review of Systems  Constitutional:  Positive for malaise/fatigue. Negative for chills and fever.       Appetite is decreased Fluid intake is normal   HENT:  Positive for ear pain (stopped up), sinus pain and sore throat.   Respiratory:  Positive for cough and sputum production. Negative for shortness of breath.   Musculoskeletal:  Negative for joint pain and myalgias.  Neurological:  Negative for headaches.        Objective:    BP 124/82   Pulse (!) 57   Ht '5\' 10"'$  (1.778 m)   Wt (!) 307 lb (139.3 kg)   SpO2 95%   BMI 44.05 kg/m    Physical Exam Vitals and nursing note reviewed.  Constitutional:      Appearance: Normal appearance. He is obese.  HENT:     Right Ear: Tympanic membrane, ear canal and external ear normal.     Left Ear: Tympanic membrane, ear canal and external ear normal.     Nose:     Right Sinus: Maxillary sinus tenderness and frontal sinus tenderness present.     Left Sinus: Maxillary sinus tenderness and frontal sinus tenderness present.     Mouth/Throat:     Mouth: Mucous membranes are moist.     Pharynx: Oropharynx is clear.  Cardiovascular:     Rate and Rhythm: Normal rate and regular rhythm.     Heart sounds: Normal heart sounds.  Pulmonary:     Effort: Pulmonary effort is normal.     Breath sounds: Normal breath sounds.   Lymphadenopathy:     Cervical: No cervical adenopathy.  Neurological:     Mental Status: He is alert.     No results found for any visits on 08/30/22.      Assessment & Plan:   Problem List Items Addressed This Visit       Respiratory   Acute non-recurrent maxillary sinusitis - Primary    Given length of symptoms and physical exam we will treat patient with doxycycline 100 mg twice daily for 7 days.  Follow-up if no improvement      Relevant Medications   Dextromethorphan HBr (DELSYM PO)   doxycycline (VIBRA-TABS) 100 MG tablet   benzonatate (TESSALON) 100 MG capsule     Other   PND (post-nasal drip)    Patient to start back on Nasonex nasal spray.  Rest drink plenty of fluid      Acute cough    Can use Delsym at night if needed.  Will send in Tessalon Perles 100 mg 3 times daily as needed.      Relevant Medications   benzonatate (TESSALON) 100 MG capsule    Meds ordered this encounter  Medications   doxycycline (VIBRA-TABS)  100 MG tablet    Sig: Take 1 tablet (100 mg total) by mouth 2 (two) times daily for 7 days.    Dispense:  14 tablet    Refill:  0    Order Specific Question:   Supervising Provider    Answer:   Glori Bickers MARNE A [[id number]]   benzonatate (TESSALON) 100 MG capsule    Sig: Take 1 capsule (100 mg total) by mouth 3 (three) times daily as needed for cough.    Dispense:  21 capsule    Refill:  0    Order Specific Question:   Supervising Provider    Answer:   Loura Pardon A [[id number]]    Return if symptoms worsen or fail to improve, for As scheduled with Dr. Silvio Pate.  Romilda Garret, NP

## 2022-08-30 NOTE — Patient Instructions (Addendum)
Nice to see you today I have sent in 2 medications to the pharmacy Follow up if you are not improving over the next week  Use the nasonex over the next week also

## 2022-08-30 NOTE — Assessment & Plan Note (Signed)
Can use Delsym at night if needed.  Will send in Tessalon Perles 100 mg 3 times daily as needed.

## 2022-08-30 NOTE — Assessment & Plan Note (Signed)
Given length of symptoms and physical exam we will treat patient with doxycycline 100 mg twice daily for 7 days.  Follow-up if no improvement

## 2022-09-17 ENCOUNTER — Ambulatory Visit (INDEPENDENT_AMBULATORY_CARE_PROVIDER_SITE_OTHER): Payer: Medicare HMO | Admitting: Internal Medicine

## 2022-09-17 ENCOUNTER — Encounter (INDEPENDENT_AMBULATORY_CARE_PROVIDER_SITE_OTHER): Payer: Self-pay | Admitting: Internal Medicine

## 2022-09-17 VITALS — BP 122/73 | HR 59 | Temp 97.6°F | Ht 70.0 in | Wt 296.0 lb

## 2022-09-17 DIAGNOSIS — Z0100 Encounter for examination of eyes and vision without abnormal findings: Secondary | ICD-10-CM | POA: Diagnosis not present

## 2022-09-17 DIAGNOSIS — E538 Deficiency of other specified B group vitamins: Secondary | ICD-10-CM | POA: Diagnosis not present

## 2022-09-17 DIAGNOSIS — E669 Obesity, unspecified: Secondary | ICD-10-CM | POA: Diagnosis not present

## 2022-09-17 DIAGNOSIS — E559 Vitamin D deficiency, unspecified: Secondary | ICD-10-CM | POA: Insufficient documentation

## 2022-09-17 DIAGNOSIS — I10 Essential (primary) hypertension: Secondary | ICD-10-CM

## 2022-09-17 DIAGNOSIS — R0602 Shortness of breath: Secondary | ICD-10-CM | POA: Insufficient documentation

## 2022-09-17 DIAGNOSIS — Z6841 Body Mass Index (BMI) 40.0 and over, adult: Secondary | ICD-10-CM | POA: Diagnosis not present

## 2022-09-17 NOTE — Progress Notes (Unsigned)
Chief Complaint:   OBESITY [name] is here to discuss his progress with his obesity treatment plan along with follow-up of his obesity related diagnoses. [name] is on the Category 3 Plan and states he is following his eating plan approximately 98% of the time. Travez states he is walking for 15 minutes 7 times per week.  Today's visit was #: 5 Starting weight: 320 lbs Starting date: 07/09/2022 Today's weight: 296 lbs Today's date: 09/17/2022 Total lbs lost to date: 24 Total lbs lost since last in-office visit: 5  Interim History: [name] presents today for follow-up.  Since last office visit he has lost 5 pounds.  Total weight loss is 24 pounds.  He reports adequate satiety, denies abnormal hunger signals.  He denies consumption of liquid calories and reports excellent adherence to reduced calorie plan.  He notes an improvement in energy levels, endurance as well as shortness of breath with exertion.  He is now able to bend down which he is not able to do before.  He is also parking further away he also notes improved mental sharpness.  He denies any barriers.  Subjective:   1. B12 deficiency On B12 supplementation with good adherence.  He notes increased levels of energy.  He denies side effects.    2. Vitamin D deficiency Levels 18.2 on high-dose supplementation.  Associated with excess adiposity and may result in leptin resistance and adipogenesis.    3. Essential hypertension Blood pressure at goal for age and risk category.  On amlodipine 5 mg a day, metoprolol and isosorbide without adverse effects.  Most recent renal parameters reviewed which showed normal electrolytes and kidney function.  4. SOB (shortness of breath) on exertion Improved with weight loss therapy.  No signs or symptoms of decompensated heart failure.    Assessment/Plan:   1. B12 deficiency We will check B12 levels in February or possibly methylmalonic acid.  2. Vitamin D deficiency He will continue on  medication for goal level of 50-60.  Will check levels in February.  3. Essential hypertension Continue with weight loss therapy.  Monitor for symptoms of orthostasis while losing weight. Continue current regimen and home monitoring for a goal blood pressure of 120/80.  4. SOB (shortness of breath) on exertion Continue with medical nutrition therapy and follow-up with his cardiologist as scheduled.  5. Obesity with current BMI of 42.6 [name] has been instructed to work up to 150 minutes of light intensity aerobic activity a week and strengthening exercises 2-3 times per week for cardiovascular health, weight loss maintenance and preservation of muscle mass.  If we are to increase intensity he may need to see his cardiologist because of history of coronary artery disease.  [name] is currently in the action stage of change. As such, his goal is to continue with weight loss efforts. He has agreed to the Category 3 Plan.   Exercise goals: For substantial health benefits, adults should do at least 150 minutes (2 hours and 30 minutes) a week of moderate-intensity, or 75 minutes (1 hour and 15 minutes) a week of vigorous-intensity aerobic physical activity, or an equivalent combination of moderate- and vigorous-intensity aerobic activity. Aerobic activity should be performed in episodes of at least 10 minutes, and preferably, it should be spread throughout the week.  Behavioral modification strategies: increasing lean protein intake, increasing water intake, meal planning and cooking strategies, keeping healthy foods in the home, avoiding temptations, and planning for success.  Kennth has agreed to follow-up with our clinic  in 3 weeks. He was informed of the importance of frequent follow-up visits to maximize his success with intensive lifestyle modifications for his multiple health conditions.   Objective:   Blood pressure 122/73, pulse (!) 59, temperature 97.6 F (36.4 C), height '5\' 10"'$  (1.778 m),  weight 296 lb (134.3 kg), SpO2 95 %. Body mass index is 42.47 kg/m.  General: Cooperative, alert, well developed, in no acute distress. HEENT: Conjunctivae and lids unremarkable. Cardiovascular: Regular rhythm.  Lungs: Normal work of breathing. Neurologic: No focal deficits.   Lab Results  Component Value Date   CREATININE 1.22 07/09/2022   BUN 18 07/09/2022   NA 144 07/09/2022   K 4.4 07/09/2022   CL 103 07/09/2022   CO2 22 07/09/2022   Lab Results  Component Value Date   ALT 18 07/09/2022   AST 17 07/09/2022   ALKPHOS 104 07/09/2022   BILITOT 0.3 07/09/2022   Lab Results  Component Value Date   HGBA1C 5.4 07/09/2022   Lab Results  Component Value Date   INSULIN 35.5 (H) 07/09/2022   Lab Results  Component Value Date   TSH 4.260 07/09/2022   Lab Results  Component Value Date   CHOL 82 (L) 07/09/2022   HDL 28 (L) 07/09/2022   LDLCALC 33 07/09/2022   TRIG 110 07/09/2022   CHOLHDL 3 11/15/2021   Lab Results  Component Value Date   VD25OH 18.2 (L) 07/09/2022   Lab Results  Component Value Date   WBC 6.8 01/08/2022   HGB 13.6 01/08/2022   HCT 42.6 01/08/2022   MCV 87.1 01/08/2022   PLT 226 01/08/2022   No results found for: "IRON", "TIBC", "FERRITIN"  Attestation Statements:   Reviewed by clinician on day of visit: allergies, medications, problem list, medical history, surgical history, family history, social history, and previous encounter notes.   Wilhemena Durie, am acting as transcriptionist for Thomes Dinning, MD.  I have reviewed the above documentation for accuracy and completeness, and I agree with the above. -Thomes Dinning, MD

## 2022-09-19 ENCOUNTER — Encounter: Payer: Self-pay | Admitting: Cardiology

## 2022-09-19 MED ORDER — METOPROLOL SUCCINATE ER 25 MG PO TB24: 25.0000 mg | ORAL_TABLET | Freq: Every day | ORAL | 3 refills | Status: DC

## 2022-09-19 NOTE — Addendum Note (Signed)
Addended by: Cristopher Estimable on: 09/19/2022 04:57 PM   Modules accepted: Orders

## 2022-09-24 ENCOUNTER — Ambulatory Visit (INDEPENDENT_AMBULATORY_CARE_PROVIDER_SITE_OTHER): Payer: Medicare HMO | Admitting: Internal Medicine

## 2022-09-24 ENCOUNTER — Encounter: Payer: Self-pay | Admitting: Internal Medicine

## 2022-09-24 VITALS — BP 122/80 | HR 58 | Temp 97.5°F | Ht 70.0 in | Wt 298.0 lb

## 2022-09-24 DIAGNOSIS — I25119 Atherosclerotic heart disease of native coronary artery with unspecified angina pectoris: Secondary | ICD-10-CM

## 2022-09-24 DIAGNOSIS — F3342 Major depressive disorder, recurrent, in full remission: Secondary | ICD-10-CM | POA: Diagnosis not present

## 2022-09-24 DIAGNOSIS — I10 Essential (primary) hypertension: Secondary | ICD-10-CM | POA: Diagnosis not present

## 2022-09-24 DIAGNOSIS — M792 Neuralgia and neuritis, unspecified: Secondary | ICD-10-CM | POA: Diagnosis not present

## 2022-09-24 DIAGNOSIS — E039 Hypothyroidism, unspecified: Secondary | ICD-10-CM | POA: Diagnosis not present

## 2022-09-24 DIAGNOSIS — R69 Illness, unspecified: Secondary | ICD-10-CM | POA: Diagnosis not present

## 2022-09-24 MED ORDER — DULOXETINE HCL 60 MG PO CPEP: 60.0000 mg | ORAL_CAPSULE | Freq: Every morning | ORAL | 3 refills | Status: DC

## 2022-09-24 MED ORDER — GABAPENTIN 600 MG PO TABS: 600.0000 mg | ORAL_TABLET | Freq: Two times a day (BID) | ORAL | 3 refills | Status: DC

## 2022-09-24 MED ORDER — BUPROPION HCL ER (XL) 300 MG PO TB24: 300.0000 mg | ORAL_TABLET | Freq: Every morning | ORAL | 3 refills | Status: DC

## 2022-09-24 NOTE — Assessment & Plan Note (Signed)
Known subcritical CAD---no intervention at cath No recent chest pain on isosorbide '30mg'$  Metoprolol 25 daily, ASA 81, atorvastatin 40 Might consider trial off isosorbide if continues to do well with lifestyle

## 2022-09-24 NOTE — Assessment & Plan Note (Signed)
Has done well with bupropion '300mg'$  daily and duloxetine 60 Trazodone '200mg'$  for sleep
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
## 2022-09-24 NOTE — Assessment & Plan Note (Signed)
Recent TSH normal on levothyroxine 88 mcg

## 2022-09-24 NOTE — Assessment & Plan Note (Signed)
BP Readings from Last 3 Encounters:  09/24/22 122/80  09/17/22 122/73  08/30/22 124/82   If BP stays low with weight loss, could consider weaning the amlodipine

## 2022-09-24 NOTE — Assessment & Plan Note (Signed)
Uses the gabapentin

## 2022-10-04 ENCOUNTER — Encounter: Payer: Self-pay | Admitting: Internal Medicine

## 2022-10-07 ENCOUNTER — Ambulatory Visit (INDEPENDENT_AMBULATORY_CARE_PROVIDER_SITE_OTHER): Payer: Medicare HMO | Admitting: Internal Medicine

## 2022-10-07 ENCOUNTER — Encounter: Payer: Self-pay | Admitting: Internal Medicine

## 2022-10-07 VITALS — BP 112/72 | HR 69 | Temp 97.5°F | Ht 70.0 in | Wt 299.0 lb

## 2022-10-07 DIAGNOSIS — K5909 Other constipation: Secondary | ICD-10-CM | POA: Diagnosis not present
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
## 2022-10-07 NOTE — Patient Instructions (Signed)
Please increase the miralax to a full capful three times a day---and senna-s 2 tabs twice a day. If you haven't gone after 3 days, try the oral dulcolax. Adjust the miralax if it is too much (causes loose bowels) You can continue to take the dulcolax every 3 days if you don't go

## 2022-10-07 NOTE — Assessment & Plan Note (Signed)
Chronic but worse lately May have element of colon dysmotility  Needs more miralax (?tid) and senna s (2 bid) Should try dulcolax if no stool after 3 days

## 2022-10-08 ENCOUNTER — Encounter (INDEPENDENT_AMBULATORY_CARE_PROVIDER_SITE_OTHER): Payer: Self-pay | Admitting: Internal Medicine

## 2022-10-08 ENCOUNTER — Ambulatory Visit (INDEPENDENT_AMBULATORY_CARE_PROVIDER_SITE_OTHER): Payer: Medicare HMO | Admitting: Internal Medicine

## 2022-10-08 VITALS — BP 125/80 | HR 55 | Temp 97.8°F | Ht 70.0 in | Wt 295.0 lb

## 2022-10-08 DIAGNOSIS — I110 Hypertensive heart disease with heart failure: Secondary | ICD-10-CM | POA: Diagnosis not present

## 2022-10-08 DIAGNOSIS — I10 Essential (primary) hypertension: Secondary | ICD-10-CM | POA: Insufficient documentation

## 2022-10-08 DIAGNOSIS — E538 Deficiency of other specified B group vitamins: Secondary | ICD-10-CM

## 2022-10-08 DIAGNOSIS — E669 Obesity, unspecified: Secondary | ICD-10-CM

## 2022-10-08 DIAGNOSIS — Z87891 Personal history of nicotine dependence: Secondary | ICD-10-CM

## 2022-10-08 DIAGNOSIS — E559 Vitamin D deficiency, unspecified: Secondary | ICD-10-CM

## 2022-10-08 DIAGNOSIS — I5032 Chronic diastolic (congestive) heart failure: Secondary | ICD-10-CM | POA: Insufficient documentation

## 2022-10-08 DIAGNOSIS — Z6841 Body Mass Index (BMI) 40.0 and over, adult: Secondary | ICD-10-CM | POA: Diagnosis not present

## 2022-10-08 NOTE — Assessment & Plan Note (Signed)
Most recent vitamin D levels  Lab Results  Component Value Date   VD25OH 18.2 (L) 07/09/2022     Deficiency state associated with adiposity and may result in leptin resistance, weight gain and fatigue. Currently on vitamin D supplementation without any adverse effects.  Plan: He has completed 3 months of high-dose vitamin D.  Check vitamin D levels today consider switching to over-the-counter supplementation.

## 2022-10-08 NOTE — Assessment & Plan Note (Signed)
Has completed 3 months of B12 supplementation.  We will check B12 levels today.

## 2022-10-08 NOTE — Assessment & Plan Note (Signed)
I reviewed most recent echocardiogram he has diastolic dysfunction no signs of pulmonary hypertension.  Patient has a protuberant abdomen and has a history of heart failure for which she takes loop diuretic daily but there are no signs of volume overload on exam.  We will check a proBNP today

## 2022-10-08 NOTE — Progress Notes (Signed)
Office: [phone]  /  Fax: [place] [facility] Height: 5' 10"$  (1.778 m) Weight: 295 lb (133.8 kg) Temp: 97.8 F (36.6 C) Pulse Rate: (!) 55 BP: 125/80 SpO2: 93 % Fasting: n Labs: n Weight at Last VIsit: 296 lb Weight Lost Since Last Visit: 1 lb  Body Fat %: 41.9 % Fat Mass (lbs): 123.8 lbs Muscle Mass (lbs): 163.2 lbs Total Body Water (lbs): 124.6 lbs Visceral Fat Rating : 28 Peak Weight: 328 lbs Starting Date: 07/09/22 Starting Weight: 320 lb Total Weight Loss (lbs): 25 lb (11.3 kg)    HPI  Chief Complaint: OBESITY  [name] is here to discuss his progress with his obesity treatment plan. He is on the the Category 3 Plan and states he is following his eating plan approximately 85 % of the time. He states he is exercising 60 minutes 3 times per week.   Interval History:  Since last office visit he has lost 1 pound.  He is somewhat surprised that has been following meal plan pretty closely.  His BIA information suggests that he is retaining fluid.  He has a history of heart failure with preserved ejection fraction and takes furosemide once a day.  He is also having problems with constipation which she is working with his PCP on.  He denies shortness of breath or lower extremity edema.  He does notice a protuberant abdomen which is somewhat decreased in size.  He denies history of liver problems or heavy alcohol consumption in the past.  He has also begun to exercise and has been going to the gym 2-3 times a week.  He denies any chest pain he overall feels more energetic.   Pharmacotherapy: None  PHYSICAL EXAM:  Blood pressure 125/80, pulse (!) 55, temperature 97.8 F (36.6 C), height 5' 10"$  (1.778 m), weight 295 lb (133.8 kg), SpO2 93 %. Body mass index is 42.33 kg/m.  General: He is overweight, cooperative, alert, well developed, and in no acute distress. PSYCH: Has normal mood, affect and thought process.   HEENT:  EOMI, sclerae are anicteric. Lungs: Normal breathing effort, no conversational dyspnea. Extremities: No edema.  Neurologic: No gross sensory or motor deficits. No tremors or fasciculations noted.    DIAGNOSTIC DATA REVIEWED:  BMET    Component Value Date/Time   NA 144 07/09/2022 0913   K 4.4 07/09/2022 0913   CL 103 07/09/2022 0913   CO2 22 07/09/2022 0913   GLUCOSE 79 07/09/2022 0913   GLUCOSE 104 (H) 01/08/2022 1314   BUN 18 07/09/2022 0913   CREATININE 1.22 07/09/2022 0913   CALCIUM 8.9 07/09/2022 0913   GFRNONAA >60 01/08/2022 1314   GFRAA >60 02/10/2020 0937   Lab Results  Component Value Date   HGBA1C 5.4 07/09/2022   Lab Results  Component Value Date   INSULIN 35.5 (H) 07/09/2022   Lab Results  Component Value Date   TSH 4.260 07/09/2022   CBC    Component Value Date/Time   WBC 6.8 01/08/2022 1314   RBC 4.89 01/08/2022 1314   HGB 13.6 01/08/2022 1314   HGB 14.5 11/23/2021 1024   HCT 42.6 01/08/2022 1314   HCT 43.9 11/23/2021 1024   PLT 226 01/08/2022 1314   PLT 239 11/23/2021 1024   MCV 87.1 01/08/2022 1314   MCV 87 11/23/2021 1024   MCH 27.8 01/08/2022 1314   MCHC 31.9 01/08/2022 1314   RDW 13.2 01/08/2022 1314   RDW 13.4 11/23/2021 1024  Iron Studies No results found for: "IRON", "TIBC", "FERRITIN", "IRONPCTSAT" Lipid Panel     Component Value Date/Time   CHOL 82 (L) 07/09/2022 0913   TRIG 110 07/09/2022 0913   HDL 28 (L) 07/09/2022 0913   CHOLHDL 3 11/15/2021 0951   VLDL 14.4 11/15/2021 0951   LDLCALC 33 07/09/2022 0913   Hepatic Function Panel     Component Value Date/Time   PROT 6.7 07/09/2022 0913   ALBUMIN 4.0 07/09/2022 0913   AST 17 07/09/2022 0913   ALT 18 07/09/2022 0913   ALKPHOS 104 07/09/2022 0913   BILITOT 0.3 07/09/2022 0913   BILIDIR <0.10 02/13/2021 0830      Component Value Date/Time   TSH 4.260 07/09/2022 0913   Nutritional Lab Results  Component Value Date   VD25OH 18.2 (L) 07/09/2022     ASSESSMENT AND  PLAN  TREATMENT PLAN FOR OBESITY:  Recommended Dietary Goals  [name] is currently in the action stage of change. As such, his goal is to continue weight management plan. He has agreed to the Category 3 Plan.  Behavioral Intervention  We discussed the following Behavioral Modification Strategies today: increasing lean protein intake and increasing vegetables.  Additional resources provided today: NA  Recommended Physical Activity Goals  [name] has been advised to work up to 150 minutes of moderate intensity aerobic activity a week and strengthening exercises 2-3 times per week for cardiovascular health, weight loss maintenance and preservation of muscle mass.   He has agreed to continue physical activity as is.    Pharmacotherapy We discussed various medication options to help [name] with his weight loss efforts and we both agreed to to continue with nutritional and behavioral strategies..  ASSOCIATED CONDITIONS ADDRESSED TODAY  B12 deficiency Assessment & Plan: Has completed 3 months of B12 supplementation.  We will check B12 levels today.  Orders: -     Vitamin B12  Vitamin D deficiency Assessment & Plan: Most recent vitamin D levels  Lab Results  Component Value Date   VD25OH 18.2 (L) 07/09/2022     Deficiency state associated with adiposity and may result in leptin resistance, weight gain and fatigue. Currently on vitamin D supplementation without any adverse effects.  Plan: He has completed 3 months of high-dose vitamin D.  Check vitamin D levels today consider switching to over-the-counter supplementation.  Orders: -     VITAMIN D 25 Hydroxy (Vit-D Deficiency, Fractures)  Chronic heart failure with preserved ejection fraction (HCC) Assessment & Plan: I reviewed most recent echocardiogram he has diastolic dysfunction no signs of pulmonary hypertension.  Patient has a protuberant abdomen and has a history of heart failure for which she takes loop diuretic daily but  there are no signs of volume overload on exam.  We will check a proBNP today  Orders: -     Pro b natriuretic peptide (BNP) -     CMP14+EGFR  Essential hypertension Assessment & Plan: Blood pressure at goal for age and risk category.  On amlodipine 5 mg, isosorbide, furosemide, metoprolol without adverse effects.    Continue with weight loss therapy.  Monitor for symptoms of orthostasis while losing weight. Continue current regimen and home monitoring for a goal blood pressure of 120/80.  We will check renal parameters today.        No follow-ups on file.Marland Kitchen He was informed of the importance of frequent follow up visits to maximize his success with intensive lifestyle modifications for his multiple health conditions.   ATTESTASTION STATEMENTS:  Reviewed  by clinician on day of visit: allergies, medications, problem list, medical history, surgical history, family history, social history, and previous encounter notes.   Time spent on visit including pre-visit chart review and post-visit care and charting was 30 minutes.    Thomes Dinning, MD

## 2022-10-08 NOTE — Assessment & Plan Note (Signed)
Blood pressure at goal for age and risk category.  On amlodipine 5 mg, isosorbide, furosemide, metoprolol without adverse effects.    Continue with weight loss therapy.  Monitor for symptoms of orthostasis while losing weight. Continue current regimen and home monitoring for a goal blood pressure of 120/80.  We will check renal parameters today.

## 2022-10-09 LAB — CMP14+EGFR
ALT: 24 IU/L (ref 0–44)
AST: 17 IU/L (ref 0–40)
Albumin/Globulin Ratio: 1.6 (ref 1.2–2.2)
Albumin: 4.1 g/dL (ref 3.9–4.9)
Alkaline Phosphatase: 95 IU/L (ref 44–121)
BUN/Creatinine Ratio: 24 (ref 10–24)
BUN: 22 mg/dL (ref 8–27)
Bilirubin Total: 0.3 mg/dL (ref 0.0–1.2)
CO2: 22 mmol/L (ref 20–29)
Calcium: 8.9 mg/dL (ref 8.6–10.2)
Chloride: 105 mmol/L (ref 96–106)
Creatinine, Ser: 0.92 mg/dL (ref 0.76–1.27)
Globulin, Total: 2.6 g/dL (ref 1.5–4.5)
Glucose: 76 mg/dL (ref 70–99)
Potassium: 4 mmol/L (ref 3.5–5.2)
Sodium: 143 mmol/L (ref 134–144)
Total Protein: 6.7 g/dL (ref 6.0–8.5)
eGFR: 92 mL/min/{1.73_m2} (ref >59)

## 2022-10-09 LAB — VITAMIN B12: Vitamin B-12: 421 pg/mL (ref 232–1245)

## 2022-10-09 LAB — PRO B NATRIURETIC PEPTIDE: NT-Pro BNP: 73 pg/mL (ref 0–376)

## 2022-10-09 LAB — VITAMIN D 25 HYDROXY (VIT D DEFICIENCY, FRACTURES): Vit D, 25-Hydroxy: 61.4 ng/mL (ref 30.0–100.0)

## 2022-10-10 DIAGNOSIS — H02234 Paralytic lagophthalmos left upper eyelid: Secondary | ICD-10-CM | POA: Diagnosis not present

## 2022-10-10 DIAGNOSIS — H16212 Exposure keratoconjunctivitis, left eye: Secondary | ICD-10-CM | POA: Diagnosis not present

## 2022-10-22 ENCOUNTER — Telehealth: Payer: Self-pay | Admitting: Internal Medicine

## 2022-10-22 DIAGNOSIS — Z9889 Other specified postprocedural states: Secondary | ICD-10-CM | POA: Diagnosis not present

## 2022-10-22 NOTE — Telephone Encounter (Signed)
Contacted Frankey Wendland to schedule their annual wellness visit. Appointment made for 12/04/2022.  [facility]tha Direct Dial: [phone]

## 2022-10-24 ENCOUNTER — Ambulatory Visit (INDEPENDENT_AMBULATORY_CARE_PROVIDER_SITE_OTHER): Payer: Medicare HMO | Admitting: Internal Medicine

## 2022-11-06 ENCOUNTER — Encounter (INDEPENDENT_AMBULATORY_CARE_PROVIDER_SITE_OTHER): Payer: Self-pay | Admitting: Internal Medicine

## 2022-11-06 ENCOUNTER — Ambulatory Visit (INDEPENDENT_AMBULATORY_CARE_PROVIDER_SITE_OTHER): Payer: Medicare HMO | Admitting: Internal Medicine

## 2022-11-06 VITALS — BP 112/75 | HR 58 | Temp 97.9°F | Ht 70.0 in | Wt 292.0 lb

## 2022-11-06 DIAGNOSIS — E88819 Insulin resistance, unspecified: Secondary | ICD-10-CM

## 2022-11-06 DIAGNOSIS — I5032 Chronic diastolic (congestive) heart failure: Secondary | ICD-10-CM

## 2022-11-06 DIAGNOSIS — Z6841 Body Mass Index (BMI) 40.0 and over, adult: Secondary | ICD-10-CM | POA: Diagnosis not present

## 2022-11-06 DIAGNOSIS — I10 Essential (primary) hypertension: Secondary | ICD-10-CM

## 2022-11-06 DIAGNOSIS — I110 Hypertensive heart disease with heart failure: Secondary | ICD-10-CM

## 2022-11-06 DIAGNOSIS — E669 Obesity, unspecified: Secondary | ICD-10-CM | POA: Diagnosis not present

## 2022-11-06 NOTE — Assessment & Plan Note (Signed)
I reviewed most recent echocardiogram he has diastolic dysfunction no signs of pulmonary hypertension.  He appears euvolemic and most recent BNP was within normal limits.  He is currently on amlodipine 5 mg a day, furosemide, isosorbide, metoprolol.  He is starting to experience some orthostasis I therefore recommend that he reduce his amlodipine to 2.5 mg a day.  He has lost about 30 pounds.  He is also reduced sodium in his diet.

## 2022-11-06 NOTE — Progress Notes (Signed)
Office: [phone]  /  Fax: [phone]  WEIGHT SUMMARY AND BIOMETRICS  Vitals Temp: 97.9 F (36.6 C) BP: 112/75 Pulse Rate: (!) 58 SpO2: 94 %   Anthropometric Measurements Height: '5\' 10"'$  (1.778 m) Weight: 292 lb (132.5 kg) BMI (Calculated): 41.9 Weight at Last Visit: 295 lb Weight Lost Since Last Visit: 3 lb Starting Weight: 320 lb Total Weight Loss (lbs): 28 lb (12.7 kg) Peak Weight: 328 lb   Body Composition  Body Fat %: 41.8 % Fat Mass (lbs): 122 lbs Muscle Mass (lbs): 161.6 lbs Total Body Water (lbs): 122.6 lbs Visceral Fat Rating : 28    HPI  Chief Complaint: OBESITY  [name] is here to discuss his progress with his obesity treatment plan. He is on the the Category 3 Plan and states he is following his eating plan approximately 85-90 % of the time. He states he is not exercising.  Interval History:  Since last office visit he has lost 3 pounds.  This brings him close to 30 pounds or 10% of his body weight.  He has been feeling great is now more mobile and has noticed an improvement in his breathing.  He is also to bend down without difficulty.  He had a lapse in exercise because of recent surgery but is looking forward to start going to the gym this Friday.  He has been not skipping meals hitting his protein goals, reading labels and making healthy choices. He reports good adherence to reduced calorie nutritional plan. '[x]'$ Denies '[]'$ Reports problems with appetite and hunger signals.  '[x]'$ Denies '[]'$ Reports problems with satiety and satiation.  '[x]'$ Denies '[]'$ Reports problems with eating patterns and portion control.  '[x]'$ Denies '[]'$ Reports abnormal cravings Stress levels are reported as low and manageable.  Barriers identified none.   Pharmacotherapy for weight loss: He is currently taking no anti-obesity medication.   Weight promoting medications identified: Psychotropic medications, Antiepileptics, and Anticholinergics.  ASSESSMENT AND PLAN  TREATMENT PLAN FOR  OBESITY:  Recommended Dietary Goals  [name] is currently in the action stage of change. As such, his goal is to continue weight management plan. He has agreed to: the Category 3 Plan.  Behavioral Intervention  We discussed the following Behavioral Modification Strategies today: increasing lean protein intake, increasing vegetables, increasing water intake, and reading food labels .  Additional resources provided today: None  Recommended Physical Activity Goals  [name] has been advised to work up to 150 minutes of moderate intensity aerobic activity a week and strengthening exercises 2-3 times per week for cardiovascular health, weight loss maintenance and preservation of muscle mass.   He has agreed to :  '[]'$  Continue current level of physical activity  '[]'$  Think about ways to increase physical activity '[x]'$  Start strengthening exercises with a goal of 2-3 sessions a week  '[x]'$  Start aerobic activity with a goal of 150 minutes a week at moderate intensity.  '[]'$  Increase the intensity, frequency or duration of strengthening exercises  '[]'$  Increase the intensity, frequency or duration of aerobic exercises   '[]'$  Increase physical activity in their day and reduce sedentary time (increase NEAT). '[]'$  Work on scheduling and tracking physical activity.   Pharmacotherapy We discussed various medication options to help [name] with his weight loss efforts and we both agreed to : continue with nutritional and behavioral strategies  ASSOCIATED CONDITIONS ADDRESSED TODAY  Chronic heart failure with preserved ejection fraction (HCC) Assessment & Plan: I reviewed most recent echocardiogram he has diastolic dysfunction no signs of pulmonary hypertension.  He appears euvolemic  and most recent BNP was within normal limits.  He is currently on amlodipine 5 mg a day, furosemide, isosorbide, metoprolol.  He is starting to experience some orthostasis I therefore recommend that he reduce his amlodipine to 2.5 mg a  day.  He has lost about 30 pounds.  He is also reduced sodium in his diet.   Essential hypertension Assessment & Plan: Blood pressure now is low normal and has been experiencing some orthostasis when standing up.  He has lost about 30 pounds.  I recommend he reduce his amlodipine to 2.5 mg a day.  He may also be able to reduce use of furosemide but I will defer this to prescribing physician.   Obesity with current BMI of 41  Insulin resistance Assessment & Plan: His HOMA-IR is 6.56 which is elevated and suggest significant insulin resistance. Optimal level < 1.9. This is complex condition associated with genetics, ectopic fat and lifestyle factors. Insulin resistance may result in weight gain, abnormal cravings (particularly for carbs) and fatigue. This may result in additional weight gain and lead to pre-diabetes and diabetes if untreated.   Lab Results  Component Value Date   HGBA1C 5.4 07/09/2022   Lab Results  Component Value Date   INSULIN 35.5 (H) 07/09/2022   Lab Results  Component Value Date   GLUCOSE 76 10/08/2022   GLUCOSE 76 12/14/2014    He has lost 30 pounds close to 10% of his body weight which will improve condition.  He may also be a candidate for metformin or incretin therapy these will be considered down the line if he starts to experience a plateau which will be inevitable.       PHYSICAL EXAM:  Blood pressure 112/75, pulse (!) 58, temperature 97.9 F (36.6 C), height '5\' 10"'$  (1.778 m), weight 292 lb (132.5 kg), SpO2 94 %. Body mass index is 41.9 kg/m.  General: He is overweight, cooperative, alert, well developed, and in no acute distress. PSYCH: Has normal mood, affect and thought process.   HEENT: EOMI, sclerae are anicteric. Lungs: Normal breathing effort, no conversational dyspnea. Extremities: No edema.  Neurologic: No gross sensory or motor deficits. No tremors or fasciculations noted.    DIAGNOSTIC DATA REVIEWED:  BMET    Component Value  Date/Time   NA 143 10/08/2022 1226   K 4.0 10/08/2022 1226   CL 105 10/08/2022 1226   CO2 22 10/08/2022 1226   GLUCOSE 76 10/08/2022 1226   GLUCOSE 104 (H) 01/08/2022 1314   BUN 22 10/08/2022 1226   CREATININE 0.92 10/08/2022 1226   CALCIUM 8.9 10/08/2022 1226   GFRNONAA >60 01/08/2022 1314   GFRAA >60 02/10/2020 0937   Lab Results  Component Value Date   HGBA1C 5.4 07/09/2022   Lab Results  Component Value Date   INSULIN 35.5 (H) 07/09/2022   Lab Results  Component Value Date   TSH 4.260 07/09/2022   CBC    Component Value Date/Time   WBC 6.8 01/08/2022 1314   RBC 4.89 01/08/2022 1314   HGB 13.6 01/08/2022 1314   HGB 14.5 11/23/2021 1024   HCT 42.6 01/08/2022 1314   HCT 43.9 11/23/2021 1024   PLT 226 01/08/2022 1314   PLT 239 11/23/2021 1024   MCV 87.1 01/08/2022 1314   MCV 87 11/23/2021 1024   MCH 27.8 01/08/2022 1314   MCHC 31.9 01/08/2022 1314   RDW 13.2 01/08/2022 1314   RDW 13.4 11/23/2021 1024   Iron Studies No results found for: "IRON", "TIBC", "  FERRITIN", "IRONPCTSAT" Lipid Panel     Component Value Date/Time   CHOL 82 (L) 07/09/2022 0913   TRIG 110 07/09/2022 0913   HDL 28 (L) 07/09/2022 0913   CHOLHDL 3 11/15/2021 0951   VLDL 14.4 11/15/2021 0951   LDLCALC 33 07/09/2022 0913   Hepatic Function Panel     Component Value Date/Time   PROT 6.7 10/08/2022 1226   ALBUMIN 4.1 10/08/2022 1226   AST 17 10/08/2022 1226   ALT 24 10/08/2022 1226   ALKPHOS 95 10/08/2022 1226   BILITOT 0.3 10/08/2022 1226   BILIDIR <0.10 02/13/2021 0830      Component Value Date/Time   TSH 4.260 07/09/2022 0913   Nutritional Lab Results  Component Value Date   VD25OH 61.4 10/08/2022   VD25OH 18.2 (L) 07/09/2022     Return in about 3 weeks (around 11/27/2022) for For Weight Mangement with Dr. Gerarda Fraction.Marland Kitchen He was informed of the importance of frequent follow up visits to maximize his success with intensive lifestyle modifications for his multiple health  conditions.   ATTESTASTION STATEMENTS:  Reviewed by clinician on day of visit: allergies, medications, problem list, medical history, surgical history, family history, social history, and previous encounter notes.     Thomes Dinning, MD

## 2022-11-06 NOTE — Assessment & Plan Note (Signed)
Blood pressure now is low normal and has been experiencing some orthostasis when standing up.  He has lost about 30 pounds.  I recommend he reduce his amlodipine to 2.5 mg a day.  He may also be able to reduce use of furosemide but I will defer this to prescribing physician.

## 2022-11-06 NOTE — Assessment & Plan Note (Signed)
His HOMA-IR is 6.56 which is elevated and suggest significant insulin resistance. Optimal level < 1.9. This is complex condition associated with genetics, ectopic fat and lifestyle factors. Insulin resistance may result in weight gain, abnormal cravings (particularly for carbs) and fatigue. This may result in additional weight gain and lead to pre-diabetes and diabetes if untreated.   Lab Results  Component Value Date   HGBA1C 5.4 07/09/2022   Lab Results  Component Value Date   INSULIN 35.5 (H) 07/09/2022   Lab Results  Component Value Date   GLUCOSE 76 10/08/2022   GLUCOSE 76 12/14/2014    He has lost 30 pounds close to 10% of his body weight which will improve condition.  He may also be a candidate for metformin or incretin therapy these will be considered down the line if he starts to experience a plateau which will be inevitable.

## 2022-11-15 ENCOUNTER — Other Ambulatory Visit (HOSPITAL_BASED_OUTPATIENT_CLINIC_OR_DEPARTMENT_OTHER): Payer: Self-pay | Admitting: Orthopaedic Surgery

## 2022-11-15 ENCOUNTER — Encounter (HOSPITAL_BASED_OUTPATIENT_CLINIC_OR_DEPARTMENT_OTHER): Payer: Self-pay | Admitting: Orthopaedic Surgery

## 2022-11-15 ENCOUNTER — Ambulatory Visit (HOSPITAL_BASED_OUTPATIENT_CLINIC_OR_DEPARTMENT_OTHER): Payer: Medicare HMO | Admitting: Orthopaedic Surgery

## 2022-11-15 DIAGNOSIS — M1712 Unilateral primary osteoarthritis, left knee: Secondary | ICD-10-CM

## 2022-11-15 DIAGNOSIS — M1711 Unilateral primary osteoarthritis, right knee: Secondary | ICD-10-CM | POA: Diagnosis not present

## 2022-11-15 DIAGNOSIS — M25562 Pain in left knee: Secondary | ICD-10-CM

## 2022-11-15 DIAGNOSIS — M25561 Pain in right knee: Secondary | ICD-10-CM

## 2022-11-15 MED ORDER — LIDOCAINE HCL 1 % IJ SOLN
4.0000 mL | INTRAMUSCULAR | Status: AC | PRN
  Administered 2022-11-15: 4 mL

## 2022-11-15 MED ORDER — TRIAMCINOLONE ACETONIDE 40 MG/ML IJ SUSP
80.0000 mg | INTRAMUSCULAR | Status: AC | PRN
  Administered 2022-11-15: 80 mg via INTRA_ARTICULAR
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
## 2022-11-18 ENCOUNTER — Encounter: Payer: Self-pay | Admitting: Internal Medicine

## 2022-11-19 ENCOUNTER — Ambulatory Visit (INDEPENDENT_AMBULATORY_CARE_PROVIDER_SITE_OTHER): Payer: Medicare HMO | Admitting: Internal Medicine

## 2022-11-19 VITALS — BP 110/80 | HR 61 | Temp 97.4°F | Ht 70.0 in | Wt 295.0 lb

## 2022-11-19 DIAGNOSIS — M792 Neuralgia and neuritis, unspecified: Secondary | ICD-10-CM

## 2022-11-19 MED ORDER — GABAPENTIN 600 MG PO TABS: 1200.0000 mg | ORAL_TABLET | Freq: Every day | ORAL | 0 refills | Status: DC

## 2022-11-19 NOTE — Patient Instructions (Signed)
Try both doses of gabapentin at bedtime (unless it makes you groggy in the morning) Try over the counter lidocaine gel on your fingers for a while to see if that helps. If that doesn't work, try over the counter capsaicin cream  Let me know how it works
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
## 2022-11-19 NOTE — Assessment & Plan Note (Addendum)
Due to past hand trauma Intolerable daytime somnolence with gabapentin--will try both doses at bedtime Try topical lidocaine-----and if not effective, try topical capsaicin Might be helped by tight gloves (he will press his hand under him with some success) May want to consider neurologist I am not excited about lyrica for him

## 2022-12-04 ENCOUNTER — Ambulatory Visit: Payer: Medicare HMO

## 2022-12-09 ENCOUNTER — Encounter (INDEPENDENT_AMBULATORY_CARE_PROVIDER_SITE_OTHER): Payer: Self-pay | Admitting: Internal Medicine

## 2022-12-09 ENCOUNTER — Ambulatory Visit: Payer: Medicare HMO | Admitting: Orthopaedic Surgery

## 2022-12-09 ENCOUNTER — Ambulatory Visit (INDEPENDENT_AMBULATORY_CARE_PROVIDER_SITE_OTHER): Payer: Medicare HMO | Admitting: Internal Medicine

## 2022-12-09 VITALS — Ht 71.25 in | Wt 288.0 lb

## 2022-12-09 VITALS — BP 129/82 | HR 54 | Temp 98.1°F | Ht 70.0 in | Wt 285.0 lb

## 2022-12-09 DIAGNOSIS — E669 Obesity, unspecified: Secondary | ICD-10-CM | POA: Diagnosis not present

## 2022-12-09 DIAGNOSIS — G8929 Other chronic pain: Secondary | ICD-10-CM

## 2022-12-09 DIAGNOSIS — M25562 Pain in left knee: Secondary | ICD-10-CM | POA: Diagnosis not present

## 2022-12-09 DIAGNOSIS — Z6841 Body Mass Index (BMI) 40.0 and over, adult: Secondary | ICD-10-CM

## 2022-12-09 DIAGNOSIS — M1712 Unilateral primary osteoarthritis, left knee: Secondary | ICD-10-CM | POA: Diagnosis not present

## 2022-12-09 DIAGNOSIS — M25561 Pain in right knee: Secondary | ICD-10-CM

## 2022-12-09 DIAGNOSIS — I5032 Chronic diastolic (congestive) heart failure: Secondary | ICD-10-CM

## 2022-12-09 DIAGNOSIS — I10 Essential (primary) hypertension: Secondary | ICD-10-CM

## 2022-12-09 DIAGNOSIS — M1711 Unilateral primary osteoarthritis, right knee: Secondary | ICD-10-CM | POA: Diagnosis not present

## 2022-12-09 DIAGNOSIS — I110 Hypertensive heart disease with heart failure: Secondary | ICD-10-CM | POA: Diagnosis not present

## 2022-12-09 NOTE — Assessment & Plan Note (Signed)
Blood pressure at goal for age and risk category.  On amlodipine, metoprolol, isosorbide without adverse effects.  Most recent renal parameters reviewed which showed normal electrolytes and kidney function.  Continue with weight loss therapy.  Monitor for symptoms of orthostasis while losing weight. Continue current regimen and home monitoring for a goal blood pressure of 120/80.

## 2022-12-09 NOTE — Assessment & Plan Note (Signed)
Asymptomatic and euvolemic.  He is also no longer experiencing dyspnea on exertion.  He is currently on amlodipine 5 mg a day, furosemide, isosorbide, metoprolol.  He is no longer experiencing orthostasis.  Continue to monitor while losing weight.
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
## 2022-12-09 NOTE — Progress Notes (Signed)
Office: [phone]  /  Fax: [phone]  WEIGHT SUMMARY AND BIOMETRICS  Vitals Temp: 98.1 F (36.7 C) BP: 129/82 Pulse Rate: (!) 54   Anthropometric Measurements Height: 5\' 10"  (1.778 m) Weight: 285 lb (129.3 kg) BMI (Calculated): 40.89 Weight at Last Visit: 292 lb Weight Lost Since Last Visit: 7 lb Starting Weight: 320 lb Total Weight Loss (lbs): 35 lb (15.9 kg) Peak Weight: 328 lb   Body Composition  Body Fat %: 42.1 % Fat Mass (lbs): 120 lbs Muscle Mass (lbs): 156.8 lbs Total Body Water (lbs): 119 lbs    Starting Date: 07/09/22  Today's Visit #: 8  No data recorded  HPI  Chief Complaint: OBESITY  [name] is here to discuss his progress with his obesity treatment plan. He is on the the Category 3 Plan and states he is following his eating plan approximately 85 % of the time. He states he is exercising 15 minutes 6 times per week.  Interval History:  Since last office visit he has lost 7 lbs. He has lost close to 43 pounds or 14% of baseline body weight.  He was recently seen by orthopedic surgery and will likely be having left total knee replacement in the future. He reports good adherence to reduced calorie nutritional plan. He has been working on reading food labels, not skipping meals, and increasing protein intake at every meal Denies problems with appetite and hunger signals.  Denies problems with satiety and satiation.  Denies problems with eating patterns and portion control.  Denies abnormal cravings. Denies feeling deprived or restricted.   Barriers identified: none.   Pharmacotherapy for weight loss: He is currently taking no anti-obesity medication.    ASSESSMENT AND PLAN  TREATMENT PLAN FOR OBESITY:  Recommended Dietary Goals  [name] is currently in the action stage of change. As such, his goal is to continue weight management plan. He has agreed to: continue current plan  Behavioral Intervention  We discussed the following  Behavioral Modification Strategies today: increasing water intake, reading food labels , and planning for success.  Additional resources provided today: None  Recommended Physical Activity Goals  [name] has been advised to work up to 150 minutes of moderate intensity aerobic activity a week and strengthening exercises 2-3 times per week for cardiovascular health, weight loss maintenance and preservation of muscle mass.   He has agreed to :  Continue current level of physical activity   Pharmacotherapy We discussed various medication options to help [name] with his weight loss efforts and we both agreed to : continue with nutritional and behavioral strategies  ASSOCIATED CONDITIONS ADDRESSED TODAY  Chronic heart failure with preserved ejection fraction Assessment & Plan: Asymptomatic and euvolemic.  He is also no longer experiencing dyspnea on exertion.  He is currently on amlodipine 5 mg a day, furosemide, isosorbide, metoprolol.  He is no longer experiencing orthostasis.  Continue to monitor while losing weight.   Essential hypertension Assessment & Plan: Blood pressure at goal for age and risk category.  On amlodipine, metoprolol, isosorbide without adverse effects.  Most recent renal parameters reviewed which showed normal electrolytes and kidney function.  Continue with weight loss therapy.  Monitor for symptoms of orthostasis while losing weight. Continue current regimen and home monitoring for a goal blood pressure of 120/80.    Obesity with current BMI of 41     PHYSICAL EXAM:  Blood pressure 129/82, pulse (!) 54, temperature 98.1 F (36.7 C), height 5\' 10"  (1.778 m), weight 285 lb (129.3  kg). Body mass index is 40.89 kg/m.  General: He is overweight, cooperative, alert, well developed, and in no acute distress. PSYCH: Has normal mood, affect and thought process.   HEENT: EOMI, sclerae are anicteric. Lungs: Normal breathing effort, no conversational  dyspnea. Extremities: No edema.  Neurologic: No gross sensory or motor deficits. No tremors or fasciculations noted.    DIAGNOSTIC DATA REVIEWED:  BMET    Component Value Date/Time   NA 143 10/08/2022 1226   K 4.0 10/08/2022 1226   CL 105 10/08/2022 1226   CO2 22 10/08/2022 1226   GLUCOSE 76 10/08/2022 1226   GLUCOSE 104 (H) 01/08/2022 1314   BUN 22 10/08/2022 1226   CREATININE 0.92 10/08/2022 1226   CALCIUM 8.9 10/08/2022 1226   GFRNONAA >60 01/08/2022 1314   GFRAA >60 02/10/2020 0937   Lab Results  Component Value Date   HGBA1C 5.4 07/09/2022   Lab Results  Component Value Date   INSULIN 35.5 (H) 07/09/2022   Lab Results  Component Value Date   TSH 4.260 07/09/2022   CBC    Component Value Date/Time   WBC 6.8 01/08/2022 1314   RBC 4.89 01/08/2022 1314   HGB 13.6 01/08/2022 1314   HGB 14.5 11/23/2021 1024   HCT 42.6 01/08/2022 1314   HCT 43.9 11/23/2021 1024   PLT 226 01/08/2022 1314   PLT 239 11/23/2021 1024   MCV 87.1 01/08/2022 1314   MCV 87 11/23/2021 1024   MCH 27.8 01/08/2022 1314   MCHC 31.9 01/08/2022 1314   RDW 13.2 01/08/2022 1314   RDW 13.4 11/23/2021 1024   Iron Studies No results found for: "IRON", "TIBC", "FERRITIN", "IRONPCTSAT" Lipid Panel     Component Value Date/Time   CHOL 82 (L) 07/09/2022 0913   TRIG 110 07/09/2022 0913   HDL 28 (L) 07/09/2022 0913   CHOLHDL 3 11/15/2021 0951   VLDL 14.4 11/15/2021 0951   LDLCALC 33 07/09/2022 0913   Hepatic Function Panel     Component Value Date/Time   PROT 6.7 10/08/2022 1226   ALBUMIN 4.1 10/08/2022 1226   AST 17 10/08/2022 1226   ALT 24 10/08/2022 1226   ALKPHOS 95 10/08/2022 1226   BILITOT 0.3 10/08/2022 1226   BILIDIR <0.10 02/13/2021 0830      Component Value Date/Time   TSH 4.260 07/09/2022 0913   Nutritional Lab Results  Component Value Date   VD25OH 61.4 10/08/2022   VD25OH 18.2 (L) 07/09/2022     Return in about 4 weeks (around 01/06/2023) for For Weight Mangement  with Dr. Rikki Spearing.Marland Kitchen He was informed of the importance of frequent follow up visits to maximize his success with intensive lifestyle modifications for his multiple health conditions.   ATTESTASTION STATEMENTS:  Reviewed by clinician on day of visit: allergies, medications, problem list, medical history, surgical history, family history, social history, and previous encounter notes.     Worthy Rancher, MD

## 2022-12-30 ENCOUNTER — Encounter: Payer: Self-pay | Admitting: Internal Medicine

## 2022-12-30 DIAGNOSIS — E039 Hypothyroidism, unspecified: Secondary | ICD-10-CM

## 2022-12-30 MED ORDER — LEVOTHYROXINE SODIUM 88 MCG PO TABS: 88.0000 ug | ORAL_TABLET | Freq: Every day | ORAL | 3 refills | Status: DC

## 2022-12-31 ENCOUNTER — Encounter: Payer: Self-pay | Admitting: Orthopaedic Surgery

## 2023-01-02 NOTE — Telephone Encounter (Signed)
I called patient and advised that there was an error in recorded height here at the office which made for an inaccurate BMI.  Will look at next Medical Weight Management appointment for newest BMI next week.  BMI needs to be less than 40 for scheduling surgery.  Patient stated he would be in touch.

## 2023-01-06 ENCOUNTER — Encounter (INDEPENDENT_AMBULATORY_CARE_PROVIDER_SITE_OTHER): Payer: Self-pay | Admitting: Internal Medicine

## 2023-01-06 ENCOUNTER — Ambulatory Visit (INDEPENDENT_AMBULATORY_CARE_PROVIDER_SITE_OTHER): Payer: Medicare HMO | Admitting: Internal Medicine

## 2023-01-06 VITALS — BP 118/84 | HR 66 | Temp 98.2°F | Ht 70.0 in | Wt 281.0 lb

## 2023-01-06 DIAGNOSIS — G4733 Obstructive sleep apnea (adult) (pediatric): Secondary | ICD-10-CM | POA: Diagnosis not present

## 2023-01-06 DIAGNOSIS — I5032 Chronic diastolic (congestive) heart failure: Secondary | ICD-10-CM

## 2023-01-06 DIAGNOSIS — Z6841 Body Mass Index (BMI) 40.0 and over, adult: Secondary | ICD-10-CM

## 2023-01-06 DIAGNOSIS — E669 Obesity, unspecified: Secondary | ICD-10-CM | POA: Diagnosis not present

## 2023-01-06 DIAGNOSIS — M1712 Unilateral primary osteoarthritis, left knee: Secondary | ICD-10-CM

## 2023-01-06 DIAGNOSIS — I10 Essential (primary) hypertension: Secondary | ICD-10-CM | POA: Diagnosis not present

## 2023-01-06 NOTE — Progress Notes (Addendum)
Office: [phone]  /  Fax: [phone]  WEIGHT SUMMARY AND BIOMETRICS  Vitals Temp: 98.2 F (36.8 C) BP: 118/84 Pulse Rate: 66 SpO2: 95 %   Anthropometric Measurements Height: 5\' 10"  (1.778 m) Weight: 281 lb (127.5 kg) BMI (Calculated): 40.32 Weight at Last Visit: 285 l Weight Lost Since Last Visit: 4 lb Starting Weight: 320 lb Total Weight Loss (lbs): 39 lb (17.7 kg) Peak Weight: 328  lb   Body Composition  Body Fat %: 40.9 % Fat Mass (lbs): 115 lbs Muscle Mass (lbs): 158 lbs Total Body Water (lbs): 113.2 lbs Visceral Fat Rating : 27    No data recorded Today's Visit #: 10  Starting Date: 07/09/22   HPI  Chief Complaint: OBESITY  [name] is here to discuss his progress with his obesity treatment plan. He is on the the Category 3 Plan and states he is following his eating plan approximately 85 % of the time. He states he is not exercising.  Interval History:  Since last office visit he has [x]  lost []  maintained [] gained weight.  He saw his surgeon and will be scheduled for knee replacement in 3 to 4 months.  His current BMI is 40.  Patient has lost 15 % of baseline body weight  Adherence to nutrition plan :  []  Excellent [x]  Good []  Fair []  Suboptimal []  Variable []  Gradually implementing [] Has not started implementation  Nutritional: Has been: [x]  consuming fruits [x]  consuming vegetables []  consuming whole grains []  consuming recommended amount of protein [x]  drinking water or low calorie drinks [x]  reducing portions [x]  eating out less []  reducing consumption of processed foods []  consuming prepackaged meals []  using protein shakes as a meal []  reading food labels  []  tracking or journaling calories.     Orexigenic Control: [x] Denies [] Reports problems with appetite and hunger signals.  [x] Denies [] Reports problems with satiety and satiation.  [x] Denies [] Reports problems with eating patterns and portion control.  [x] Denies [] Reports strong cravings  for highly palatable foods [x] Denies [] Reports problems with feeling restricted or deprived  Stress levels: [x]  Low [] Medium [] High   Barriers identified: orthopedic problems, medical conditions or chronic pain affecting mobility.   Pharmacotherapy for weight loss: He is currently taking no anti-obesity medication.    ASSESSMENT AND PLAN  TREATMENT PLAN FOR OBESITY:  Recommended Dietary Goals  F…Lix is currently in the action stage of change. As such, his goal is to continue weight management plan. He has agreed to: continue current plan  Behavioral Intervention  We discussed the following Behavioral Modification Strategies today: increasing lean protein intake, decreasing simple carbohydrates , increasing vegetables, increasing lower glycemic fruits, increasing water intake, and planning for success.  Additional resources provided today: None  Recommended Physical Activity Goals  Arnold has been advised to work up to 150 minutes of moderate intensity aerobic activity a week and strengthening exercises 2-3 times per week for cardiovascular health, weight loss maintenance and preservation of muscle mass.   He has agreed to :  Start aerobic activity with a goal of 150 minutes a week at moderate intensity.  and will be trying recumbent cycle.  He has not been going to the gym because of his knee.  Pharmacotherapy We discussed various medication options to help Altha with his weight loss efforts and we both agreed to : continue with nutritional and behavioral strategies  ASSOCIATED CONDITIONS ADDRESSED TODAY  Chronic heart failure with preserved ejection fraction [facility]) Assessment & Plan: Asymptomatic and euvolemic.  He is also  no longer experiencing dyspnea on exertion.  He is currently on amlodipine 5 mg a day, furosemide, isosorbide, metoprolol.  No orthostasis reported.  Continue current regimen monitor for orthostasis while losing weight   Essential hypertension Assessment  & Plan: Blood pressure at goal for age and risk category.  On amlodipine, metoprolol, isosorbide without adverse effects.    Continue with weight loss therapy.  Monitor for symptoms of orthostasis while losing weight. Continue current regimen and home monitoring for a goal blood pressure of 120/80.    Obesity with current BMI of 40 Assessment & Plan: Patient has lost 15 % of his baseline body weight with lifestyle changes alone.  He is very eager to having his surgery the requiring of him to have a BMI under 40.  He will continue with nutritional and behavioral strategies.  He does not require pharmacotherapy at present time   OSA on CPAP Assessment & Plan: He reports good compliance with CPAP therapy.  He has lost 15% of baseline body weight and may be a candidate for repeat sleep study when he reaches 20%.  Continue with nutrition and behavioral strategies for weight loss.   Unilateral primary osteoarthritis, left knee Assessment & Plan: Severe, symptomatic and affecting his quality of life.  Advised to use Voltaren gel 4 g 3 times a day.  He is on aspirin and 2 antidepressants which may increase the risk of bleeding so I do not recommend oral NSAIDs.  He may try to supplement with turmeric.  He will try to do recumbent cycle to get in physical activity.  He does not like water sports.     PHYSICAL EXAM:  Blood pressure 118/84, pulse 66, temperature 98.2 F (36.8 C), height 5\' 10"  (1.778 m), weight 281 lb (127.5 kg), SpO2 95 %. Body mass index is 40.32 kg/m.  General: He is overweight, cooperative, alert, well developed, and in no acute distress. PSYCH: Has normal mood, affect and thought process.   HEENT: EOMI, sclerae are anicteric. Lungs: Normal breathing effort, no conversational dyspnea. Extremities: No edema.  Neurologic: No gross sensory or motor deficits. No tremors or fasciculations noted.    DIAGNOSTIC DATA REVIEWED:  BMET    Component Value Date/Time   NA 143  10/08/2022 1226   K 4.0 10/08/2022 1226   CL 105 10/08/2022 1226   CO2 22 10/08/2022 1226   GLUCOSE 76 10/08/2022 1226   GLUCOSE 104 (H) 01/08/2022 1314   BUN 22 10/08/2022 1226   CREATININE 0.92 10/08/2022 1226   CALCIUM 8.9 10/08/2022 1226   GFRNONAA >60 01/08/2022 1314   GFRAA >60 02/10/2020 0937   Lab Results  Component Value Date   HGBA1C 5.4 07/09/2022   Lab Results  Component Value Date   INSULIN 35.5 (H) 07/09/2022   Lab Results  Component Value Date   TSH 4.260 07/09/2022   CBC    Component Value Date/Time   WBC 6.8 01/08/2022 1314   RBC 4.89 01/08/2022 1314   HGB 13.6 01/08/2022 1314   HGB 14.5 11/23/2021 1024   HCT 42.6 01/08/2022 1314   HCT 43.9 11/23/2021 1024   PLT 226 01/08/2022 1314   PLT 239 11/23/2021 1024   MCV 87.1 01/08/2022 1314   MCV 87 11/23/2021 1024   MCH 27.8 01/08/2022 1314   MCHC 31.9 01/08/2022 1314   RDW 13.2 01/08/2022 1314   RDW 13.4 11/23/2021 1024   Iron Studies No results found for: "IRON", "TIBC", "FERRITIN", "IRONPCTSAT" Lipid Panel     Component  Value Date/Time   CHOL 82 (L) 07/09/2022 0913   TRIG 110 07/09/2022 0913   HDL 28 (L) 07/09/2022 0913   CHOLHDL 3 11/15/2021 0951   VLDL 14.4 11/15/2021 0951   LDLCALC 33 07/09/2022 0913   Hepatic Function Panel     Component Value Date/Time   PROT 6.7 10/08/2022 1226   ALBUMIN 4.1 10/08/2022 1226   AST 17 10/08/2022 1226   ALT 24 10/08/2022 1226   ALKPHOS 95 10/08/2022 1226   BILITOT 0.3 10/08/2022 1226   BILIDIR <0.10 02/13/2021 0830      Component Value Date/Time   TSH 4.260 07/09/2022 0913   Nutritional Lab Results  Component Value Date   VD25OH 61.4 10/08/2022   VD25OH 18.2 (L) 07/09/2022     Return in about 2 weeks (around 01/20/2023) for For Weight Mangement with Dr. Rikki Spearing.Marland Kitchen He was informed of the importance of frequent follow up visits to maximize his success with intensive lifestyle modifications for his multiple health conditions.   ATTESTASTION  STATEMENTS:  Reviewed by clinician on day of visit: allergies, medications, problem list, medical history, surgical history, family history, social history, and previous encounter notes.     Worthy Rancher, MD

## 2023-01-06 NOTE — Assessment & Plan Note (Signed)
Blood pressure at goal for age and risk category.  On amlodipine, metoprolol, isosorbide without adverse effects.    Continue with weight loss therapy.  Monitor for symptoms of orthostasis while losing weight. Continue current regimen and home monitoring for a goal blood pressure of 120/80.

## 2023-01-06 NOTE — Assessment & Plan Note (Signed)
Patient has lost 15 % of his baseline body weight with lifestyle changes alone.  He is very eager to having his surgery the requiring of him to have a BMI under 40.  He will continue with nutritional and behavioral strategies.  He does not require pharmacotherapy at present time

## 2023-01-06 NOTE — Assessment & Plan Note (Addendum)
Severe, symptomatic and affecting his quality of life.  Advised to use Voltaren gel 4 g 3 times a day.  He is on aspirin and 2 antidepressants which may increase the risk of bleeding so I do not recommend oral NSAIDs.  He may try to supplement with turmeric.  He will try to do recumbent cycle to get in physical activity.  He does not like water sports.

## 2023-01-06 NOTE — Assessment & Plan Note (Signed)
Asymptomatic and euvolemic.  He is also no longer experiencing dyspnea on exertion.  He is currently on amlodipine 5 mg a day, furosemide, isosorbide, metoprolol.  No orthostasis reported.  Continue current regimen monitor for orthostasis while losing weight

## 2023-01-06 NOTE — Assessment & Plan Note (Signed)
He reports good compliance with CPAP therapy.  He has lost 15% of baseline body weight and may be a candidate for repeat sleep study when he reaches 20%.  Continue with nutrition and behavioral strategies for weight loss.

## 2023-01-16 ENCOUNTER — Encounter: Payer: Self-pay | Admitting: Internal Medicine

## 2023-01-28 ENCOUNTER — Encounter: Payer: Self-pay | Admitting: Internal Medicine

## 2023-01-28 DIAGNOSIS — G479 Sleep disorder, unspecified: Secondary | ICD-10-CM

## 2023-01-28 MED ORDER — TRAZODONE HCL 100 MG PO TABS: 200.0000 mg | ORAL_TABLET | Freq: Every day | ORAL | 0 refills | Status: DC

## 2023-02-06 ENCOUNTER — Ambulatory Visit (INDEPENDENT_AMBULATORY_CARE_PROVIDER_SITE_OTHER): Payer: Medicare HMO | Admitting: Internal Medicine

## 2023-02-06 ENCOUNTER — Encounter (INDEPENDENT_AMBULATORY_CARE_PROVIDER_SITE_OTHER): Payer: Self-pay | Admitting: Internal Medicine

## 2023-02-06 VITALS — BP 121/85 | HR 54 | Temp 97.9°F | Ht 70.0 in | Wt 280.0 lb

## 2023-02-06 DIAGNOSIS — Z6841 Body Mass Index (BMI) 40.0 and over, adult: Secondary | ICD-10-CM

## 2023-02-06 DIAGNOSIS — I10 Essential (primary) hypertension: Secondary | ICD-10-CM

## 2023-02-06 DIAGNOSIS — G479 Sleep disorder, unspecified: Secondary | ICD-10-CM | POA: Diagnosis not present

## 2023-02-06 DIAGNOSIS — E669 Obesity, unspecified: Secondary | ICD-10-CM | POA: Diagnosis not present

## 2023-02-06 DIAGNOSIS — G4733 Obstructive sleep apnea (adult) (pediatric): Secondary | ICD-10-CM

## 2023-02-06 NOTE — Progress Notes (Signed)
Office: [phone]  /  Fax: [phone]  WEIGHT SUMMARY AND BIOMETRICS  Vitals Temp: 97.9 F (36.6 C) BP: 121/85 Pulse Rate: (!) 54 SpO2: 96 %   Anthropometric Measurements Height: 5\' 10"  (1.778 m) Weight: 280 lb (127 kg) BMI (Calculated): 40.18 Weight at Last Visit: 281 lb Weight Lost Since Last Visit: 1 lb Starting Weight: 320 lb Total Weight Loss (lbs): 40 lb (18.1 kg) Peak Weight: 328 lb   Body Composition  Body Fat %: 40.7 % Fat Mass (lbs): 114.2 lbs Muscle Mass (lbs): 158 lbs Total Body Water (lbs): 114.4 lbs Visceral Fat Rating : 27    No data recorded Today's Visit #: 11  Starting Date: 07/09/22   HPI  Chief Complaint: OBESITY  [name] is here to discuss his progress with his obesity treatment plan. He is on the the Category 3 Plan and states he is following his eating plan approximately 88 % of the time. He states he is exercising 30 minutes 5 times per week.  Interval History:  Since last office visit he has 1 lb. He reports good adherence to reduced calorie nutritional plan. He has been working on not skipping meals, increasing protein intake at every meal, eating more fruits, eating more vegetables, drinking more water, avoiding and or reducing liquid calories, journaling and tracking calories, making healthier choices, continues to exercise, and 0 Denies problems with appetite and hunger signals.  Denies problems with satiety and satiation.  Denies problems with eating patterns and portion control.  Denies abnormal cravings. Denies feeling deprived or restricted.   Barriers identified:  on obesogenic medications .   Pharmacotherapy for weight loss: He is currently taking no anti-obesity medication.    ASSESSMENT AND PLAN  TREATMENT PLAN FOR OBESITY:  Recommended Dietary Goals  [name] is currently in the action stage of change. As such, his goal is to continue weight management plan. He has agreed to: continue current  plan  Behavioral Intervention  We discussed the following Behavioral Modification Strategies today: increasing lean protein intake, decreasing simple carbohydrates , increasing vegetables, increasing lower glycemic fruits, increasing fiber rich foods, avoiding skipping meals, increasing water intake, work on tracking and journaling calories using tracking application, reading food labels , continue to practice mindfulness when eating, and planning for success.  Additional resources provided today: None  Recommended Physical Activity Goals  [name] has been advised to work up to 150 minutes of moderate intensity aerobic activity a week and strengthening exercises 2-3 times per week for cardiovascular health, weight loss maintenance and preservation of muscle mass.   He has agreed to :  Think about ways to increase daily physical activity and overcoming barriers to exercise  Pharmacotherapy We discussed various medication options to help [name] with his weight loss efforts and we both agreed to : continue with nutritional and behavioral strategies  ASSOCIATED CONDITIONS ADDRESSED TODAY  Essential hypertension Assessment & Plan: Blood pressure at goal for age and risk category.  On amlodipine which she recently increased to 5 mg a day, metoprolol, isosorbide without adverse effects.    Continue with weight loss therapy.  Monitor for symptoms of orthostasis while losing weight. Continue current regimen and home monitoring for a goal blood pressure of 120/80.    Obesity with current BMI of 40  Sleep disturbance Assessment & Plan: Patient with chronic use of CNS depressing drugs which may contribute to weight gain.  We discussed the benefits of cognitive behavioral therapy for insomnia and was provided with website for sleep  foundation for more information   OSA on CPAP Assessment & Plan: He reports good compliance with CPAP therapy.  He has lost 15% of baseline body weight and may be a  candidate for repeat sleep study when he reaches 20%.  Continue with nutrition and behavioral strategies for weight loss.     PHYSICAL EXAM:  Blood pressure 121/85, pulse (!) 54, temperature 97.9 F (36.6 C), height 5\' 10"  (1.778 m), weight 280 lb (127 kg), SpO2 96 %. Body mass index is 40.18 kg/m.  General: He is overweight, cooperative, alert, well developed, and in no acute distress. PSYCH: Has normal mood, affect and thought process.   HEENT: EOMI, sclerae are anicteric. Lungs: Normal breathing effort, no conversational dyspnea. Extremities: No edema.  Neurologic: No gross sensory or motor deficits. No tremors or fasciculations noted.    DIAGNOSTIC DATA REVIEWED:  BMET    Component Value Date/Time   NA 143 10/08/2022 1226   K 4.0 10/08/2022 1226   CL 105 10/08/2022 1226   CO2 22 10/08/2022 1226   GLUCOSE 76 10/08/2022 1226   GLUCOSE 104 (H) 01/08/2022 1314   BUN 22 10/08/2022 1226   CREATININE 0.92 10/08/2022 1226   CALCIUM 8.9 10/08/2022 1226   GFRNONAA >60 01/08/2022 1314   GFRAA >60 02/10/2020 0937   Lab Results  Component Value Date   HGBA1C 5.4 07/09/2022   Lab Results  Component Value Date   INSULIN 35.5 (H) 07/09/2022   Lab Results  Component Value Date   TSH 4.260 07/09/2022   CBC    Component Value Date/Time   WBC 6.8 01/08/2022 1314   RBC 4.89 01/08/2022 1314   HGB 13.6 01/08/2022 1314   HGB 14.5 11/23/2021 1024   HCT 42.6 01/08/2022 1314   HCT 43.9 11/23/2021 1024   PLT 226 01/08/2022 1314   PLT 239 11/23/2021 1024   MCV 87.1 01/08/2022 1314   MCV 87 11/23/2021 1024   MCH 27.8 01/08/2022 1314   MCHC 31.9 01/08/2022 1314   RDW 13.2 01/08/2022 1314   RDW 13.4 11/23/2021 1024   Iron Studies No results found for: "IRON", "TIBC", "FERRITIN", "IRONPCTSAT" Lipid Panel     Component Value Date/Time   CHOL 82 (L) 07/09/2022 0913   TRIG 110 07/09/2022 0913   HDL 28 (L) 07/09/2022 0913   CHOLHDL 3 11/15/2021 0951   VLDL 14.4 11/15/2021  0951   LDLCALC 33 07/09/2022 0913   Hepatic Function Panel     Component Value Date/Time   PROT 6.7 10/08/2022 1226   ALBUMIN 4.1 10/08/2022 1226   AST 17 10/08/2022 1226   ALT 24 10/08/2022 1226   ALKPHOS 95 10/08/2022 1226   BILITOT 0.3 10/08/2022 1226   BILIDIR <0.10 02/13/2021 0830      Component Value Date/Time   TSH 4.260 07/09/2022 0913   Nutritional Lab Results  Component Value Date   VD25OH 61.4 10/08/2022   VD25OH 18.2 (L) 07/09/2022     No follow-ups on file.Marland Kitchen He was informed of the importance of frequent follow up visits to maximize his success with intensive lifestyle modifications for his multiple health conditions.   ATTESTASTION STATEMENTS:  Reviewed by clinician on day of visit: allergies, medications, problem list, medical history, surgical history, family history, social history, and previous encounter notes.     Worthy Rancher, MD

## 2023-02-06 NOTE — Assessment & Plan Note (Signed)
Patient with chronic use of CNS depressing drugs which may contribute to weight gain.  We discussed the benefits of cognitive behavioral therapy for insomnia and was provided with website for sleep foundation for more information

## 2023-02-06 NOTE — Assessment & Plan Note (Signed)
Blood pressure at goal for age and risk category.  On amlodipine which she recently increased to 5 mg a day, metoprolol, isosorbide without adverse effects.    Continue with weight loss therapy.  Monitor for symptoms of orthostasis while losing weight. Continue current regimen and home monitoring for a goal blood pressure of 120/80.

## 2023-02-06 NOTE — Assessment & Plan Note (Signed)
He reports good compliance with CPAP therapy.  He has lost 15% of baseline body weight and may be a candidate for repeat sleep study when he reaches 20%.  Continue with nutrition and behavioral strategies for weight loss. 
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
## 2023-02-25 ENCOUNTER — Encounter: Payer: Self-pay | Admitting: Urology

## 2023-02-25 ENCOUNTER — Ambulatory Visit: Payer: Medicare HMO | Admitting: Urology

## 2023-02-25 ENCOUNTER — Encounter: Payer: Self-pay | Admitting: Cardiology

## 2023-02-25 VITALS — BP 123/84 | HR 64 | Ht 70.0 in | Wt 280.0 lb

## 2023-02-25 DIAGNOSIS — E291 Testicular hypofunction: Secondary | ICD-10-CM

## 2023-02-25 DIAGNOSIS — N529 Male erectile dysfunction, unspecified: Secondary | ICD-10-CM | POA: Diagnosis not present

## 2023-02-25 DIAGNOSIS — E349 Endocrine disorder, unspecified: Secondary | ICD-10-CM | POA: Diagnosis not present

## 2023-02-25 DIAGNOSIS — R319 Hematuria, unspecified: Secondary | ICD-10-CM | POA: Diagnosis not present

## 2023-02-25 DIAGNOSIS — N401 Enlarged prostate with lower urinary tract symptoms: Secondary | ICD-10-CM | POA: Diagnosis not present

## 2023-02-25 LAB — URINALYSIS, COMPLETE
Bilirubin, UA: NEGATIVE
Glucose, UA: NEGATIVE
Ketones, UA: NEGATIVE
Nitrite, UA: NEGATIVE
Protein,UA: NEGATIVE
RBC, UA: NEGATIVE
Specific Gravity, UA: 1.02 (ref 1.005–1.030)
Urobilinogen, Ur: 2 mg/dL — ABNORMAL HIGH (ref 0.2–1.0)
pH, UA: 6 (ref 5.0–7.5)

## 2023-02-25 LAB — MICROSCOPIC EXAMINATION

## 2023-02-26 LAB — TESTOSTERONE: Testosterone: 212 ng/dL — ABNORMAL LOW (ref 264–916)

## 2023-02-28 ENCOUNTER — Other Ambulatory Visit: Payer: Medicare HMO

## 2023-02-28 DIAGNOSIS — E349 Endocrine disorder, unspecified: Secondary | ICD-10-CM | POA: Diagnosis not present

## 2023-03-01 LAB — TESTOSTERONE: Testosterone: 288 ng/dL (ref 264–916)

## 2023-03-03 ENCOUNTER — Encounter (INDEPENDENT_AMBULATORY_CARE_PROVIDER_SITE_OTHER): Payer: Self-pay | Admitting: Internal Medicine

## 2023-03-03 ENCOUNTER — Ambulatory Visit (INDEPENDENT_AMBULATORY_CARE_PROVIDER_SITE_OTHER): Payer: Medicare HMO | Admitting: Internal Medicine

## 2023-03-03 VITALS — BP 112/72 | HR 67 | Temp 98.0°F | Ht 70.0 in | Wt 284.0 lb

## 2023-03-03 DIAGNOSIS — E559 Vitamin D deficiency, unspecified: Secondary | ICD-10-CM

## 2023-03-03 DIAGNOSIS — I5032 Chronic diastolic (congestive) heart failure: Secondary | ICD-10-CM

## 2023-03-03 DIAGNOSIS — G4733 Obstructive sleep apnea (adult) (pediatric): Secondary | ICD-10-CM | POA: Diagnosis not present

## 2023-03-03 DIAGNOSIS — E66813 Obesity, class 3: Secondary | ICD-10-CM

## 2023-03-03 DIAGNOSIS — Z6841 Body Mass Index (BMI) 40.0 and over, adult: Secondary | ICD-10-CM

## 2023-03-03 DIAGNOSIS — E669 Obesity, unspecified: Secondary | ICD-10-CM | POA: Diagnosis not present

## 2023-03-03 DIAGNOSIS — I10 Essential (primary) hypertension: Secondary | ICD-10-CM | POA: Diagnosis not present

## 2023-03-03 MED ORDER — VITAMIN D3 50 MCG (2000 UT) PO CAPS
2000.00 [IU] | ORAL_CAPSULE | Freq: Every day | ORAL | Status: AC
Start: 2023-03-03 — End: ?

## 2023-03-03 NOTE — Assessment & Plan Note (Signed)
Patient has lost 15 % of his baseline body weight with lifestyle changes alone.  His physical activity levels have declined due to orthopedic problems.  He is very eager to having his surgery the requiring of him to have a BMI under 40.  He has reached a plateau and has been doing lifestyle changes for approximately 9-10 months.  He may benefit from antiobesity medications as long as there are no contraindications and he has several comorbid conditions.  We will look at options for him at the next office visit.

## 2023-03-03 NOTE — Assessment & Plan Note (Signed)
Asymptomatic and euvolemic.  He is also no longer experiencing dyspnea on exertion but his weight has gone up and also abdominal circumference.  He is currently on amlodipine 5 mg a day, furosemide, isosorbide, metoprolol.  He reports diuresis but 3 to 4 hours after taking furosemide, may need a higher dose due to inadequate clinical response.  He will follow-up with his PCP in a few days.  Of asked him to check his weight over the next 3 days if weight continues to go up he needs to reach out to his cardiologist.  We also discussed reducing sodium in diet.  No orthostasis reported.  Continue current regimen monitor for orthostasis while losing weight

## 2023-03-03 NOTE — Assessment & Plan Note (Signed)
He reports good compliance with CPAP therapy.  Was recently seen by sleep specialist no adjustments in settings required.  He has lost 15% of baseline body weight and may be a candidate for repeat sleep study when he reaches 20%.  Continue with nutrition and behavioral strategies for weight loss.

## 2023-03-03 NOTE — Progress Notes (Signed)
Office: [phone]  /  Fax: [phone]  WEIGHT SUMMARY AND BIOMETRICS  Vitals Temp: 98 F (36.7 C) BP: 112/72 Pulse Rate: 67 SpO2: 95 %   Anthropometric Measurements Height: 5\' 10"  (1.778 m) Weight: 284 lb (128.8 kg) BMI (Calculated): 40.75 Weight at Last Visit: 280lb Weight Lost Since Last Visit: 0lb Weight Gained Since Last Visit: 4lb Starting Weight: 320lb Total Weight Loss (lbs): 36 lb (16.3 kg) Peak Weight: 328lb   Body Composition  Body Fat %: 40.9 % Fat Mass (lbs): 116.4 lbs Muscle Mass (lbs): 159.8 lbs Total Body Water (lbs): 122.2 lbs Visceral Fat Rating : 27    No data recorded Today's Visit #: 12  Starting Date: 07/09/22   HPI  Chief Complaint: OBESITY  [name] is here to discuss his progress with his obesity treatment plan. He is on the the Category 3 Plan and states he is following his eating plan approximately 88 % of the time. He states he is walking around the house.   Interval History:  Since last office visit he has increased 4 lb. He reports good adherence to reduced calorie nutritional plan. He has been working on not skipping meals, increasing protein intake at every meal, eating more fruits, eating more vegetables, and drinking more water. Has stopped going to gym as he felt limited. Started to skip meals, does it during the hot summer months. Missing breakfast.   [name]: Denies problems with appetite and hunger signals.  Denies problems with satiety and satiation.  Denies problems with eating patterns and portion [name].  Denies abnormal cravings. Denies feeling deprived or restricted.   Barriers identified: orthopedic problems, medical conditions or chronic pain affecting mobility. Loss of appetite.  Pharmacotherapy for weight loss: He is currently taking no anti-obesity medication.    ASSESSMENT AND PLAN  TREATMENT PLAN FOR OBESITY:  Recommended Dietary Goals  [name] is currently in the action stage of  change. As such, his goal is to continue weight management plan. He has agreed to: continue current plan  Behavioral Intervention  We discussed the following Behavioral Modification Strategies today: increasing lean protein intake, decreasing simple carbohydrates , increasing vegetables, increasing lower glycemic fruits, increasing fiber rich foods, avoiding skipping meals, increasing water intake, and planning for success.  Additional resources provided today: None  Recommended Physical Activity Goals  [name] has been advised to work up to 150 minutes of moderate intensity aerobic activity a week and strengthening exercises 2-3 times per week for cardiovascular health, weight loss maintenance and preservation of muscle mass.   He has agreed to :  Think about ways to increase daily physical activity and overcoming barriers to exercise and Increase physical activity in their day and reduce sedentary time (increase NEAT).  Pharmacotherapy We discussed various medication options to help [name] with his weight loss efforts and we both agreed to : continue with nutritional and behavioral strategies  ASSOCIATED CONDITIONS ADDRESSED TODAY  Vitamin D deficiency -     Vitamin D3; Take 1 capsule (2,000 Units total) by mouth daily.  Essential hypertension Assessment & Plan: Blood pressure at goal for age and risk category.  On amlodipine which she recently increased to 5 mg a day, metoprolol, isosorbide without adverse effects.    Continue with weight loss therapy.  Monitor for symptoms of orthostasis while losing weight. Continue current regimen and home monitoring for a goal blood pressure of 120/80.    Obesity with current BMI of 40 Assessment & Plan: Patient has lost 15 % of  his baseline body weight with lifestyle changes alone.  His physical activity levels have declined due to orthopedic problems.  He is very eager to having his surgery the requiring of him to have a BMI under 40.  He has  reached a plateau and has been doing lifestyle changes for approximately 9-10 months.  He may benefit from antiobesity medications as long as there are no contraindications and he has several comorbid conditions.  We will look at options for him at the next office visit.   OSA on CPAP Assessment & Plan: He reports good compliance with CPAP therapy.  Was recently seen by sleep specialist no adjustments in settings required.  He has lost 15% of baseline body weight and may be a candidate for repeat sleep study when he reaches 20%.  Continue with nutrition and behavioral strategies for weight loss.   Chronic heart failure with preserved ejection fraction (HCC) Assessment & Plan: Asymptomatic and euvolemic.  He is also no longer experiencing dyspnea on exertion but his weight has gone up and also abdominal circumference.  He is currently on amlodipine 5 mg a day, furosemide, isosorbide, metoprolol.  He reports diuresis but 3 to 4 hours after taking furosemide, may need a higher dose due to inadequate clinical response.  He will follow-up with his PCP in a few days.  Of asked him to check his weight over the next 3 days if weight continues to go up he needs to reach out to his cardiologist.  We also discussed reducing sodium in diet.  No orthostasis reported.  Continue current regimen monitor for orthostasis while losing weight     PHYSICAL EXAM:  Blood pressure 112/72, pulse 67, temperature 98 F (36.7 C), height 5\' 10"  (1.778 m), weight 284 lb (128.8 kg), SpO2 95 %. Body mass index is 40.75 kg/m.  General: He is overweight, cooperative, alert, well developed, and in no acute distress. PSYCH: Has normal mood, affect and thought process.   HEENT: EOMI, sclerae are anicteric. Lungs: Normal breathing effort, no conversational dyspnea. Extremities: No edema.  Neurologic: No gross sensory or motor deficits. No tremors or fasciculations noted.    DIAGNOSTIC DATA REVIEWED:  BMET    Component  Value Date/Time   NA 143 10/08/2022 1226   K 4.0 10/08/2022 1226   CL 105 10/08/2022 1226   CO2 22 10/08/2022 1226   GLUCOSE 76 10/08/2022 1226   GLUCOSE 104 (H) 01/08/2022 1314   BUN 22 10/08/2022 1226   CREATININE 0.92 10/08/2022 1226   CALCIUM 8.9 10/08/2022 1226   GFRNONAA >60 01/08/2022 1314   GFRAA >60 02/10/2020 0937   Lab Results  Component Value Date   HGBA1C 5.4 07/09/2022   Lab Results  Component Value Date   INSULIN 35.5 (H) 07/09/2022   Lab Results  Component Value Date   TSH 4.260 07/09/2022   CBC    Component Value Date/Time   WBC 6.8 01/08/2022 1314   RBC 4.89 01/08/2022 1314   HGB 13.6 01/08/2022 1314   HGB 14.5 11/23/2021 1024   HCT 42.6 01/08/2022 1314   HCT 43.9 11/23/2021 1024   PLT 226 01/08/2022 1314   PLT 239 11/23/2021 1024   MCV 87.1 01/08/2022 1314   MCV 87 11/23/2021 1024   MCH 27.8 01/08/2022 1314   MCHC 31.9 01/08/2022 1314   RDW 13.2 01/08/2022 1314   RDW 13.4 11/23/2021 1024   Iron Studies No results found for: "IRON", "TIBC", "FERRITIN", "IRONPCTSAT" Lipid Panel     Component  Value Date/Time   CHOL 82 (L) 07/09/2022 0913   TRIG 110 07/09/2022 0913   HDL 28 (L) 07/09/2022 0913   CHOLHDL 3 11/15/2021 0951   VLDL 14.4 11/15/2021 0951   LDLCALC 33 07/09/2022 0913   Hepatic Function Panel     Component Value Date/Time   PROT 6.7 10/08/2022 1226   ALBUMIN 4.1 10/08/2022 1226   AST 17 10/08/2022 1226   ALT 24 10/08/2022 1226   ALKPHOS 95 10/08/2022 1226   BILITOT 0.3 10/08/2022 1226   BILIDIR <0.10 02/13/2021 0830      Component Value Date/Time   TSH 4.260 07/09/2022 0913   Nutritional Lab Results  Component Value Date   VD25OH 61.4 10/08/2022   VD25OH 18.2 (L) 07/09/2022     Return in about 4 weeks (around 03/31/2023) for For Weight Mangement with Dr. Rikki Spearing.Marland Kitchen He was informed of the importance of frequent follow up visits to maximize his success with intensive lifestyle modifications for his multiple health  conditions.   ATTESTASTION STATEMENTS:  Reviewed by clinician on day of visit: allergies, medications, problem list, medical history, surgical history, family history, social history, and previous encounter notes.     Worthy Rancher, MD

## 2023-03-03 NOTE — Assessment & Plan Note (Signed)
Blood pressure at goal for age and risk category.  On amlodipine which she recently increased to 5 mg a day, metoprolol, isosorbide without adverse effects.    Continue with weight loss therapy.  Monitor for symptoms of orthostasis while losing weight. Continue current regimen and home monitoring for a goal blood pressure of 120/80.  

## 2023-03-06 ENCOUNTER — Ambulatory Visit (INDEPENDENT_AMBULATORY_CARE_PROVIDER_SITE_OTHER): Payer: Medicare HMO | Admitting: Internal Medicine

## 2023-03-06 ENCOUNTER — Encounter: Payer: Self-pay | Admitting: Internal Medicine

## 2023-03-06 VITALS — BP 124/84 | HR 62 | Temp 97.6°F | Ht 70.0 in | Wt 287.0 lb

## 2023-03-06 DIAGNOSIS — D6869 Other thrombophilia: Secondary | ICD-10-CM | POA: Diagnosis not present

## 2023-03-06 DIAGNOSIS — Z125 Encounter for screening for malignant neoplasm of prostate: Secondary | ICD-10-CM

## 2023-03-06 NOTE — Assessment & Plan Note (Signed)
Easy bruising now--but just on forearms and hands Doesn't remember trauma--but unlikely to be pathologic Will check CBC just in case Still on ASA 81mg --no change
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
## 2023-03-07 LAB — COMPREHENSIVE METABOLIC PANEL
ALT: 16 U/L (ref 0–53)
AST: 14 U/L (ref 0–37)
Albumin: 4.2 g/dL (ref 3.5–5.2)
Alkaline Phosphatase: 82 U/L (ref 39–117)
BUN: 24 mg/dL — ABNORMAL HIGH (ref 6–23)
CO2: 31 mEq/L (ref 19–32)
Calcium: 9.5 mg/dL (ref 8.4–10.5)
Chloride: 102 mEq/L (ref 96–112)
Creatinine, Ser: 0.95 mg/dL (ref 0.40–1.50)
GFR: 83.19 mL/min (ref >60.00)
Glucose, Bld: 83 mg/dL (ref 70–99)
Potassium: 4.5 mEq/L (ref 3.5–5.1)
Sodium: 140 mEq/L (ref 135–145)
Total Bilirubin: 0.4 mg/dL (ref 0.2–1.2)
Total Protein: 7 g/dL (ref 6.0–8.3)

## 2023-03-07 LAB — PSA, MEDICARE: PSA: 0.66 ng/ml (ref 0.10–4.00)

## 2023-03-07 LAB — CBC
HCT: 44 % (ref 39.0–52.0)
Hemoglobin: 14.3 g/dL (ref 13.0–17.0)
MCHC: 32.6 g/dL (ref 30.0–36.0)
MCV: 87.6 fl (ref 78.0–100.0)
Platelets: 214 10*3/uL (ref 150.0–400.0)
RBC: 5.02 Mil/uL (ref 4.22–5.81)
RDW: 14.1 % (ref 11.5–15.5)
WBC: 10.2 10*3/uL (ref 4.0–10.5)

## 2023-03-07 LAB — TSH: TSH: 2.41 u[IU]/mL (ref 0.35–5.50)

## 2023-03-09 ENCOUNTER — Encounter: Payer: Self-pay | Admitting: Cardiology

## 2023-03-10 ENCOUNTER — Encounter: Payer: Self-pay | Admitting: Nurse Practitioner

## 2023-03-10 ENCOUNTER — Ambulatory Visit: Payer: Medicare HMO | Attending: Nurse Practitioner | Admitting: Nurse Practitioner

## 2023-03-10 VITALS — BP 94/62 | HR 60 | Ht 70.0 in | Wt 284.4 lb

## 2023-03-10 DIAGNOSIS — R079 Chest pain, unspecified: Secondary | ICD-10-CM

## 2023-03-10 DIAGNOSIS — I25118 Atherosclerotic heart disease of native coronary artery with other forms of angina pectoris: Secondary | ICD-10-CM

## 2023-03-10 DIAGNOSIS — R0602 Shortness of breath: Secondary | ICD-10-CM

## 2023-03-10 DIAGNOSIS — G4733 Obstructive sleep apnea (adult) (pediatric): Secondary | ICD-10-CM | POA: Diagnosis not present

## 2023-03-10 DIAGNOSIS — R0609 Other forms of dyspnea: Secondary | ICD-10-CM | POA: Diagnosis not present

## 2023-03-10 DIAGNOSIS — E785 Hyperlipidemia, unspecified: Secondary | ICD-10-CM

## 2023-03-10 DIAGNOSIS — I10 Essential (primary) hypertension: Secondary | ICD-10-CM | POA: Diagnosis not present

## 2023-03-10 NOTE — Patient Instructions (Signed)
Medication Instructions:  Decrease Metoprolol 12.5 mg daily  Furosemide (Lasix) 20 mg daily and 20 mg as needed for swelling, weight gain or shortness of breath.  *If you need a refill on your cardiac medications before your next appointment, please call your pharmacy*   Lab Work: BNP today    Testing/Procedures: Your physician has requested that you have a lexiscan myoview. For further information please visit [email or web address] Please follow instruction sheet, as given.    Follow-Up: At [facility], you and your health needs are our priority.  As part of our continuing mission to provide you with exceptional heart care, we have created designated Provider Care Teams.  These Care Teams include your primary Cardiologist (physician) and Advanced Practice Providers (APPs -  Physician Assistants and Nurse Practitioners) who all work together to provide you with the care you need, when you need it.  We recommend signing up for the patient portal called "MyChart".  Sign up information is provided on this After Visit Summary.  MyChart is used to connect with patients for Virtual Visits (Telemedicine).  Patients are able to view lab/test results, encounter notes, upcoming appointments, etc.  Non-urgent messages can be sent to your provider as well.   To learn more about what you can do with MyChart, go to [email or web address].    Your next appointment:   6 week(s)  Provider:   Bernadene Person, NP        Other Instructions ow to Prepare for Your Myoview Test (stress test):  1.Please do not take these medications before your test:  (please note if this is an exercise test pt should hold beta blocker prior) 2.Your remaining medications may be taken with water. 3.Nothing to eat or drink, except water, 4 hours prior to arrival time.  NO caffeine/decaffeinated products, or chocolate 12 hours prior to arrival. 4.Ladies, please do not wear dresses.  Skirts or pants are approprate,  please wear a short sleeve shirt. 5.NO perfume, cologne or lotion 6.Wear comfortable walking shoes.  NO HEELS! 7.Total time is 3 to 4 hours; you may want to bring reading material for the waiting time. 8.Please report to [facility] for your test  What to expect after you arrive:  Once you arrive and check in for your appointment an IV will be started in your arm.  Then the Technoligist will inject a small amount of radioactive tracer.  There will be a 1 hour waiting period after this injection.  A series of pictures will be taken of your heart following this waiting period.  You will be prepped for the stress portion of the test.  During the stress portion of your test you will either walk on a treadmill or receive a small, safe amount of radioactive tracer injected in your IV.  After the stress portion, there is a short rest period during which time your heart and blood pressure will be monitored.  After the short rest period the Technologist will begin your second set of pictures.  Your doctor will inform you of your test results within 7-10 business days.  In preparation for your appointment, medication and supplies will be purchased.  Appointment availability is limited, so if you need to cancel or reschedule please call the office at [phone] 24 hours in advance to avoid a cancellation fee of $100.00
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
## 2023-03-10 NOTE — Telephone Encounter (Signed)
 Noted  

## 2023-03-10 NOTE — Telephone Encounter (Signed)
Patient states he had this pain for 10-15 minutes.  No SOB, slight nausea. Tightness in chest with pain to Left arm. Pain was at rest.  Patient has not started BP log as  yet per last message.  Will discuss at appt if any issues

## 2023-03-11 LAB — BRAIN NATRIURETIC PEPTIDE: BNP: 15.8 pg/mL (ref 0.0–100.0)

## 2023-03-13 ENCOUNTER — Telehealth (HOSPITAL_COMMUNITY): Payer: Self-pay | Admitting: *Deleted

## 2023-03-13 ENCOUNTER — Telehealth: Payer: Self-pay

## 2023-03-13 NOTE — Telephone Encounter (Signed)
Left a detailed message for pt with lab results. Pt advised to call back with any questions or concerns.

## 2023-03-13 NOTE — Telephone Encounter (Signed)
Pt reached and given instructions for MPI study. 

## 2023-03-17 ENCOUNTER — Observation Stay
Admission: EM | Admit: 2023-03-17 | Discharge: 2023-03-19 | Disposition: A | Discharge status: Home / Self Care | Payer: Medicare HMO | Attending: Internal Medicine | Admitting: Internal Medicine

## 2023-03-17 ENCOUNTER — Other Ambulatory Visit: Payer: Self-pay

## 2023-03-17 ENCOUNTER — Emergency Department: Payer: Medicare HMO

## 2023-03-17 DIAGNOSIS — I5032 Chronic diastolic (congestive) heart failure: Secondary | ICD-10-CM | POA: Diagnosis not present

## 2023-03-17 DIAGNOSIS — I25110 Atherosclerotic heart disease of native coronary artery with unstable angina pectoris: Secondary | ICD-10-CM | POA: Diagnosis not present

## 2023-03-17 DIAGNOSIS — G4733 Obstructive sleep apnea (adult) (pediatric): Secondary | ICD-10-CM

## 2023-03-17 DIAGNOSIS — R110 Nausea: Secondary | ICD-10-CM | POA: Diagnosis not present

## 2023-03-17 DIAGNOSIS — R7989 Other specified abnormal findings of blood chemistry: Secondary | ICD-10-CM | POA: Diagnosis not present

## 2023-03-17 DIAGNOSIS — E039 Hypothyroidism, unspecified: Secondary | ICD-10-CM | POA: Diagnosis not present

## 2023-03-17 DIAGNOSIS — T300 Burn of unspecified body region, unspecified degree: Secondary | ICD-10-CM | POA: Diagnosis not present

## 2023-03-17 DIAGNOSIS — E785 Hyperlipidemia, unspecified: Secondary | ICD-10-CM | POA: Diagnosis not present

## 2023-03-17 DIAGNOSIS — I10 Essential (primary) hypertension: Secondary | ICD-10-CM | POA: Diagnosis present

## 2023-03-17 DIAGNOSIS — I214 Non-ST elevation (NSTEMI) myocardial infarction: Secondary | ICD-10-CM | POA: Diagnosis not present

## 2023-03-17 DIAGNOSIS — I200 Unstable angina: Secondary | ICD-10-CM | POA: Diagnosis not present

## 2023-03-17 DIAGNOSIS — Z7982 Long term (current) use of aspirin: Secondary | ICD-10-CM | POA: Insufficient documentation

## 2023-03-17 DIAGNOSIS — Z79899 Other long term (current) drug therapy: Secondary | ICD-10-CM | POA: Diagnosis not present

## 2023-03-17 DIAGNOSIS — E782 Mixed hyperlipidemia: Secondary | ICD-10-CM | POA: Diagnosis present

## 2023-03-17 DIAGNOSIS — R079 Chest pain, unspecified: Secondary | ICD-10-CM | POA: Diagnosis not present

## 2023-03-17 DIAGNOSIS — Z87891 Personal history of nicotine dependence: Secondary | ICD-10-CM | POA: Diagnosis not present

## 2023-03-17 DIAGNOSIS — I110 Hypertensive heart disease with heart failure: Secondary | ICD-10-CM | POA: Insufficient documentation

## 2023-03-17 DIAGNOSIS — I5030 Unspecified diastolic (congestive) heart failure: Secondary | ICD-10-CM

## 2023-03-17 LAB — CBC WITH DIFFERENTIAL/PLATELET
Abs Immature Granulocytes: 0.03 10*3/uL (ref 0.00–0.07)
Basophils Absolute: 0.1 10*3/uL (ref 0.0–0.1)
Basophils Relative: 1 %
Eosinophils Absolute: 0.1 10*3/uL (ref 0.0–0.5)
Eosinophils Relative: 1 %
HCT: 48.3 % (ref 39.0–52.0)
Hemoglobin: 15.7 g/dL (ref 13.0–17.0)
Immature Granulocytes: 0 %
Lymphocytes Relative: 12 %
Lymphs Abs: 1.1 10*3/uL (ref 0.7–4.0)
MCH: 28 pg (ref 26.0–34.0)
MCHC: 32.5 g/dL (ref 30.0–36.0)
MCV: 86.3 fL (ref 80.0–100.0)
Monocytes Absolute: 0.5 10*3/uL (ref 0.1–1.0)
Monocytes Relative: 6 %
Neutro Abs: 6.9 10*3/uL (ref 1.7–7.7)
Neutrophils Relative %: 80 %
Platelets: 229 10*3/uL (ref 150–400)
RBC: 5.6 MIL/uL (ref 4.22–5.81)
RDW: 13.2 % (ref 11.5–15.5)
WBC: 8.6 10*3/uL (ref 4.0–10.5)
nRBC: 0 % (ref 0.0–0.2)

## 2023-03-17 LAB — HEPARIN LEVEL (UNFRACTIONATED): Heparin Unfractionated: 0.23 IU/mL — ABNORMAL LOW (ref 0.30–0.70)

## 2023-03-17 LAB — BASIC METABOLIC PANEL
Anion gap: 9 (ref 5–15)
BUN: 21 mg/dL (ref 8–23)
CO2: 27 mmol/L (ref 22–32)
Calcium: 9 mg/dL (ref 8.9–10.3)
Chloride: 103 mmol/L (ref 98–111)
Creatinine, Ser: 0.83 mg/dL (ref 0.61–1.24)
GFR, Estimated: >60 mL/min (ref >60)
Glucose, Bld: 86 mg/dL (ref 70–99)
Potassium: 4 mmol/L (ref 3.5–5.1)
Sodium: 139 mmol/L (ref 135–145)

## 2023-03-17 LAB — PROTIME-INR
INR: 1 (ref 0.8–1.2)
Prothrombin Time: 13.7 seconds (ref 11.4–15.2)

## 2023-03-17 LAB — TROPONIN I (HIGH SENSITIVITY)
Troponin I (High Sensitivity): 27 ng/L — ABNORMAL HIGH (ref <18)
Troponin I (High Sensitivity): 24 ng/L — ABNORMAL HIGH (ref <18)

## 2023-03-17 LAB — BRAIN NATRIURETIC PEPTIDE: B Natriuretic Peptide: 44.8 pg/mL (ref 0.0–100.0)

## 2023-03-17 LAB — APTT: aPTT: 21 seconds — ABNORMAL LOW (ref 24–36)

## 2023-03-17 LAB — HIV ANTIBODY (ROUTINE TESTING W REFLEX): HIV Screen 4th Generation wRfx: NONREACTIVE

## 2023-03-17 MED ORDER — SODIUM CHLORIDE 0.9 % IV BOLUS
250.0000 mL | Freq: Once | INTRAVENOUS | Status: AC
  Administered 2023-03-17: 250 mL via INTRAVENOUS

## 2023-03-17 MED ORDER — HEPARIN BOLUS VIA INFUSION
4000.0000 [IU] | Freq: Once | INTRAVENOUS | Status: AC
  Administered 2023-03-17: 4000 [IU] via INTRAVENOUS
  Filled 2023-03-17: qty 4000

## 2023-03-17 MED ORDER — HEPARIN (PORCINE) 25000 UT/250ML-% IV SOLN
1500.0000 [IU]/h | INTRAVENOUS | Status: DC
  Administered 2023-03-17: 1300 [IU]/h via INTRAVENOUS
  Administered 2023-03-18: 1500 [IU]/h via INTRAVENOUS
  Filled 2023-03-17 (×2): qty 250

## 2023-03-17 MED ORDER — ASPIRIN 81 MG PO TBEC
81.0000 mg | DELAYED_RELEASE_TABLET | Freq: Every day | ORAL | Status: DC
  Administered 2023-03-17 – 2023-03-19 (×3): 81 mg via ORAL
  Filled 2023-03-17 (×3): qty 1

## 2023-03-17 MED ORDER — ENOXAPARIN SODIUM 60 MG/0.6ML IJ SOSY: 60.0000 mg | PREFILLED_SYRINGE | INTRAMUSCULAR | Status: DC

## 2023-03-17 MED ORDER — ONDANSETRON HCL 4 MG/2ML IJ SOLN: 4.0000 mg | Freq: Four times a day (QID) | INTRAMUSCULAR | Status: DC | PRN

## 2023-03-17 MED ORDER — ACETAMINOPHEN 325 MG PO TABS: 650.0000 mg | ORAL_TABLET | ORAL | Status: DC | PRN

## 2023-03-17 MED ORDER — ATORVASTATIN CALCIUM 20 MG PO TABS
40.0000 mg | ORAL_TABLET | Freq: Every day | ORAL | Status: DC
  Administered 2023-03-17 – 2023-03-19 (×3): 40 mg via ORAL
  Filled 2023-03-17 (×3): qty 2

## 2023-03-17 MED ORDER — HEPARIN BOLUS VIA INFUSION
1500.0000 [IU] | Freq: Once | INTRAVENOUS | Status: AC
  Administered 2023-03-17: 1500 [IU] via INTRAVENOUS
  Filled 2023-03-17: qty 1500

## 2023-03-17 MED ORDER — METOPROLOL SUCCINATE ER 25 MG PO TB24
25.0000 mg | ORAL_TABLET | Freq: Every day | ORAL | Status: DC
  Administered 2023-03-17 – 2023-03-18 (×2): 25 mg via ORAL
  Filled 2023-03-17 (×3): qty 1

## 2023-03-17 NOTE — ED Provider Notes (Addendum)
[facility] Provider Note    Event Date/Time   First MD Initiated Contact with Patient 03/17/23 1135     (approximate)   History   Chest Pain   HPI  [name] is a 67 y.o. male with history of coronary artery disease presents with complaints of chest pain.  Patient reports he had significant chest tightness, nausea and radiation to the left arm this morning, now mostly resolved.  He notes that he had scheduled appointment with his cardiologist for a stress test later in the week.  This episode today was significantly worse than another episode last week.  He we did receive aspirin via EMS.     Physical Exam   Triage Vital Signs: ED Triage Vitals  Encounter Vitals Group     BP 03/17/23 1113 124/73     Systolic BP Percentile --      Diastolic BP Percentile --      Pulse Rate 03/17/23 1113 84     Resp 03/17/23 1113 18     Temp 03/17/23 1113 98.3 F (36.8 C)     Temp src --      SpO2 03/17/23 1113 98 %     Weight 03/17/23 1112 127 kg (280 lb)     Height 03/17/23 1112 1.778 m (5\' 10" )     Head Circumference --      Peak Flow --      Pain Score 03/17/23 1112 2     Pain Loc --      Pain Education --      Exclude from Growth Chart --     Most recent vital signs: Vitals:   03/17/23 1113 03/17/23 1210  BP: 124/73   Pulse: 84 70  Resp: 18 12  Temp: 98.3 F (36.8 C)   SpO2: 98% 95%     General: Awake, no distress.  CV:  Good peripheral perfusion.  Resp:  Normal effort.  Abd:  No distention.  Other:     ED Results / Procedures / Treatments   Labs (all labs ordered are listed, but only abnormal results are displayed) Labs Reviewed  TROPONIN I (HIGH SENSITIVITY) - Abnormal; Notable for the following components:      Result Value   Troponin I (High Sensitivity) 27 (*)    All other components within normal limits  BASIC METABOLIC PANEL  BRAIN NATRIURETIC PEPTIDE  CBC WITH DIFFERENTIAL/PLATELET  CBC WITH DIFFERENTIAL/PLATELET      EKG  ED ECG REPORT I, Jene Every, the attending physician, personally viewed and interpreted this ECG.  Date: 03/17/2023  Rhythm: normal sinus rhythm QRS Axis: normal Intervals: normal ST/T Wave abnormalities: normal Narrative Interpretation: no evidence of acute ischemia    RADIOLOGY Chest x-ray viewed interpret by me, no acute abnormality    PROCEDURES:  Critical Care performed: yes  CRITICAL CARE Performed by: Jene Every   Total critical care time: 30 minutes  Critical care time was exclusive of separately billable procedures and treating other patients.  Critical care was necessary to treat or prevent imminent or life-threatening deterioration.  Critical care was time spent personally by me on the following activities: development of treatment plan with patient and/or surrogate as well as nursing, discussions with consultants, evaluation of patient's response to treatment, examination of patient, obtaining history from patient or surrogate, ordering and performing treatments and interventions, ordering and review of laboratory studies, ordering and review of radiographic studies, pulse oximetry and re-evaluation of patient's condition.   Procedures  MEDICATIONS ORDERED IN ED: Medications  sodium chloride 0.9 % bolus 250 mL (has no administration in time range)     IMPRESSION / MDM / ASSESSMENT AND PLAN / ED COURSE  I reviewed the triage vital signs and the nursing notes. Patient's presentation is most consistent with acute presentation with potential threat to life or bodily function.  Patient presents with chest pain as detailed above.  Differential includes ACS, angina, unstable angina  Patient reports pain improving here in the emergency department.  EKG is overall reassuring however concerning HPI ----------------------------------------- 12:57 PM on 03/17/2023 -----------------------------------------  Initial troponin is elevated at  27, patient reports pain is improved still feels somewhat dizzy, will give a bolus of IV fluids.  Patient will need admission to the hospital  Reviewed catheterization from December 05, 2021, patient had moderate 50% stenosis of the second diagonal branch  Consulted the hospitalist for admission, no indication for heparinization at this time given no chest pain at this time      FINAL CLINICAL IMPRESSION(S) / ED DIAGNOSES   Final diagnoses:  Unstable angina (HCC)     Rx / DC Orders   ED Discharge Orders     None        Note:  This document was prepared using Dragon voice recognition software and may include unintentional dictation errors.   Jene Every, MD 03/17/23 1258    Jene Every, MD 03/17/23 [phone]
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
## 2023-03-17 NOTE — ED Notes (Signed)
This RN to stick patient 2x without success of obtaining heparin level. Requested assists from fellow RN

## 2023-03-17 NOTE — Assessment & Plan Note (Addendum)
Recurring episodes of CP w/ concern for unstable angina  Noted baseline CAD s/p Cardiac catheterization April 2023 showed 30% RCA stenosis, 50% second diagonal stenosis, 40% mid LAD stenosis Followed by Dr. Jens Som  CP now resolved s/p full dose ASA and NTG  Trop 27  EKG NSR  HEART Score around 6  S/p full dose ASA  Currently CP free  Will trend trop  Formally consult cardiology  Heparin gtt  Follow up formal cardiology consult
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
## 2023-03-17 NOTE — Assessment & Plan Note (Signed)
BP stable  Titrate BP regimen

## 2023-03-17 NOTE — Assessment & Plan Note (Signed)
Cont synthroid 
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
## 2023-03-17 NOTE — Assessment & Plan Note (Signed)
2D ECHO 04/2022 EF 60-65%, grade 1 DD  Euvolemic  Cont home regimen  Strict Is and Os and daily weights Follow up formal cardiology recommendations.

## 2023-03-17 NOTE — Assessment & Plan Note (Signed)
CPAP.  

## 2023-03-17 NOTE — Assessment & Plan Note (Signed)
statin
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
## 2023-03-17 NOTE — ED Triage Notes (Signed)
Pt c/o substernal chest pain with radiation to left arm and jaw with nausea. Pain started at rest. Pt took 324 asa and 1 SL nitroglycerin pta. Pt has hx chf, stress test scheduled by cardiologist for this Thursday due to pt having chest pain similar to this last week.
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
## 2023-03-17 NOTE — ED Notes (Signed)
Lab called to draw pts blood

## 2023-03-18 ENCOUNTER — Observation Stay (HOSPITAL_BASED_OUTPATIENT_CLINIC_OR_DEPARTMENT_OTHER)
Admit: 2023-03-18 | Discharge: 2023-03-18 | Disposition: A | Discharge status: Home / Self Care | Payer: Medicare HMO | Attending: Medical | Admitting: Medical

## 2023-03-18 ENCOUNTER — Encounter
Admission: EM | Disposition: A | Discharge status: Home / Self Care | Payer: Self-pay | Source: Home / Self Care | Attending: Emergency Medicine

## 2023-03-18 DIAGNOSIS — R079 Chest pain, unspecified: Secondary | ICD-10-CM

## 2023-03-18 DIAGNOSIS — I25110 Atherosclerotic heart disease of native coronary artery with unstable angina pectoris: Secondary | ICD-10-CM | POA: Diagnosis not present

## 2023-03-18 DIAGNOSIS — I5032 Chronic diastolic (congestive) heart failure: Secondary | ICD-10-CM | POA: Diagnosis not present

## 2023-03-18 DIAGNOSIS — I200 Unstable angina: Secondary | ICD-10-CM

## 2023-03-18 HISTORY — PX: LEFT HEART CATH AND CORONARY ANGIOGRAPHY: CATH118249

## 2023-03-18 LAB — LIPID PANEL
Cholesterol: 60 mg/dL (ref 0–200)
HDL: 20 mg/dL — ABNORMAL LOW (ref >40)
LDL Cholesterol: 29 mg/dL (ref 0–99)
Total CHOL/HDL Ratio: 3 RATIO
Triglycerides: 56 mg/dL (ref <150)
VLDL: 11 mg/dL (ref 0–40)

## 2023-03-18 LAB — CBC
HCT: 44.2 % (ref 39.0–52.0)
Hemoglobin: 14.2 g/dL (ref 13.0–17.0)
MCH: 28 pg (ref 26.0–34.0)
MCHC: 32.1 g/dL (ref 30.0–36.0)
MCV: 87.2 fL (ref 80.0–100.0)
Platelets: 213 10*3/uL (ref 150–400)
RBC: 5.07 MIL/uL (ref 4.22–5.81)
RDW: 13.3 % (ref 11.5–15.5)
WBC: 9.5 10*3/uL (ref 4.0–10.5)
nRBC: 0 % (ref 0.0–0.2)

## 2023-03-18 LAB — ECHOCARDIOGRAM COMPLETE
AR max vel: 2.13 cm2
AV Area VTI: 2.55 cm2
AV Area mean vel: 2.44 cm2
AV Mean grad: 2 mmHg
AV Peak grad: 5.2 mmHg
Ao pk vel: 1.14 m/s
Area-P 1/2: 2.74 cm2
Height: 70 in
MV VTI: 1.61 cm2
S' Lateral: 3.6 cm
Weight: 4480 oz

## 2023-03-18 LAB — HEPARIN LEVEL (UNFRACTIONATED)
Heparin Unfractionated: 0.38 IU/mL (ref 0.30–0.70)
Heparin Unfractionated: 0.41 [IU]/mL (ref 0.30–0.70)

## 2023-03-18 SURGERY — LEFT HEART CATH AND CORONARY ANGIOGRAPHY
Anesthesia: Moderate Sedation

## 2023-03-18 MED ORDER — ENOXAPARIN SODIUM 80 MG/0.8ML IJ SOSY
65.0000 mg | PREFILLED_SYRINGE | INTRAMUSCULAR | Status: DC
  Administered 2023-03-19: 65 mg via SUBCUTANEOUS
  Filled 2023-03-18: qty 0.8

## 2023-03-18 MED ORDER — SODIUM CHLORIDE 0.9% FLUSH
3.0000 mL | Freq: Two times a day (BID) | INTRAVENOUS | Status: DC
  Administered 2023-03-19: 3 mL via INTRAVENOUS

## 2023-03-18 MED ORDER — MIDAZOLAM HCL 2 MG/2ML IJ SOLN
INTRAMUSCULAR | Status: DC | PRN
  Administered 2023-03-18: 1 mg via INTRAVENOUS

## 2023-03-18 MED ORDER — ASPIRIN 81 MG PO CHEW: 81.0000 mg | CHEWABLE_TABLET | ORAL | Status: DC

## 2023-03-18 MED ORDER — BUPROPION HCL ER (XL) 150 MG PO TB24
300.0000 mg | ORAL_TABLET | Freq: Every morning | ORAL | Status: DC
  Administered 2023-03-19: 300 mg via ORAL
  Filled 2023-03-18: qty 1
  Filled 2023-03-18: qty 2

## 2023-03-18 MED ORDER — ISOSORBIDE MONONITRATE ER 30 MG PO TB24
30.0000 mg | ORAL_TABLET | Freq: Two times a day (BID) | ORAL | Status: DC
  Administered 2023-03-18 – 2023-03-19 (×2): 30 mg via ORAL
  Filled 2023-03-18 (×2): qty 1

## 2023-03-18 MED ORDER — SODIUM CHLORIDE 0.9 % WEIGHT BASED INFUSION
1.0000 mL/kg/h | INTRAVENOUS | Status: DC
  Administered 2023-03-18: 1 mL/kg/h via INTRAVENOUS

## 2023-03-18 MED ORDER — SODIUM CHLORIDE 0.9% FLUSH: 3.0000 mL | INTRAVENOUS | Status: DC | PRN

## 2023-03-18 MED ORDER — POLYETHYLENE GLYCOL 3350 17 G PO PACK
34.0000 g | PACK | ORAL | Status: DC
  Filled 2023-03-18: qty 2

## 2023-03-18 MED ORDER — TRAZODONE HCL 100 MG PO TABS
200.0000 mg | ORAL_TABLET | Freq: Every day | ORAL | Status: DC
  Administered 2023-03-18: 200 mg via ORAL
  Filled 2023-03-18: qty 2

## 2023-03-18 MED ORDER — VERAPAMIL HCL 2.5 MG/ML IV SOLN
INTRAVENOUS | Status: AC
  Filled 2023-03-18: qty 2

## 2023-03-18 MED ORDER — DULOXETINE HCL 30 MG PO CPEP
60.0000 mg | ORAL_CAPSULE | Freq: Every morning | ORAL | Status: DC
  Administered 2023-03-19: 60 mg via ORAL
  Filled 2023-03-18: qty 1
  Filled 2023-03-18: qty 2

## 2023-03-18 MED ORDER — SODIUM CHLORIDE 0.9 % WEIGHT BASED INFUSION
3.0000 mL/kg/h | INTRAVENOUS | Status: DC
  Administered 2023-03-18: 3 mL/kg/h via INTRAVENOUS

## 2023-03-18 MED ORDER — IOHEXOL 300 MG/ML  SOLN
INTRAMUSCULAR | Status: DC | PRN
  Administered 2023-03-18: 36 mL

## 2023-03-18 MED ORDER — HEPARIN (PORCINE) IN NACL 1000-0.9 UT/500ML-% IV SOLN
INTRAVENOUS | Status: DC | PRN
  Administered 2023-03-18 (×2): 500 mL

## 2023-03-18 MED ORDER — LABETALOL HCL 5 MG/ML IV SOLN: 10.0000 mg | INTRAVENOUS | Status: AC | PRN

## 2023-03-18 MED ORDER — GABAPENTIN 300 MG PO CAPS
600.0000 mg | ORAL_CAPSULE | Freq: Two times a day (BID) | ORAL | Status: DC
  Administered 2023-03-18 – 2023-03-19 (×2): 600 mg via ORAL
  Filled 2023-03-18 (×2): qty 2

## 2023-03-18 MED ORDER — SODIUM CHLORIDE 0.9 % IV SOLN: INTRAVENOUS | Status: AC

## 2023-03-18 MED ORDER — VERAPAMIL HCL 2.5 MG/ML IV SOLN
INTRAVENOUS | Status: DC | PRN
  Administered 2023-03-18 (×2): 2.5 mg via INTRA_ARTERIAL

## 2023-03-18 MED ORDER — MIDAZOLAM HCL 2 MG/2ML IJ SOLN
INTRAMUSCULAR | Status: AC
  Filled 2023-03-18: qty 2

## 2023-03-18 MED ORDER — HYDRALAZINE HCL 20 MG/ML IJ SOLN: 10.0000 mg | INTRAMUSCULAR | Status: AC | PRN

## 2023-03-18 MED ORDER — LIDOCAINE HCL 1 % IJ SOLN
INTRAMUSCULAR | Status: AC
  Filled 2023-03-18: qty 20

## 2023-03-18 MED ORDER — HEPARIN SODIUM (PORCINE) 1000 UNIT/ML IJ SOLN
INTRAMUSCULAR | Status: DC | PRN
  Administered 2023-03-18: 5000 [IU] via INTRAVENOUS

## 2023-03-18 MED ORDER — FENTANYL CITRATE (PF) 100 MCG/2ML IJ SOLN
INTRAMUSCULAR | Status: DC | PRN
  Administered 2023-03-18: 25 ug via INTRAVENOUS

## 2023-03-18 MED ORDER — LIDOCAINE HCL (PF) 1 % IJ SOLN
INTRAMUSCULAR | Status: DC | PRN
  Administered 2023-03-18: 2 mL

## 2023-03-18 MED ORDER — LEVOTHYROXINE SODIUM 88 MCG PO TABS
88.0000 ug | ORAL_TABLET | Freq: Every day | ORAL | Status: DC
  Administered 2023-03-19: 88 ug via ORAL
  Filled 2023-03-18 (×2): qty 1

## 2023-03-18 MED ORDER — HEPARIN (PORCINE) IN NACL 1000-0.9 UT/500ML-% IV SOLN
INTRAVENOUS | Status: AC
  Filled 2023-03-18: qty 1000

## 2023-03-18 MED ORDER — SODIUM CHLORIDE 0.9 % IV SOLN: 250.0000 mL | INTRAVENOUS | Status: DC | PRN

## 2023-03-18 MED ORDER — HEPARIN SODIUM (PORCINE) 1000 UNIT/ML IJ SOLN
INTRAMUSCULAR | Status: AC
  Filled 2023-03-18: qty 10

## 2023-03-18 MED ORDER — FENTANYL CITRATE (PF) 100 MCG/2ML IJ SOLN
INTRAMUSCULAR | Status: AC
  Filled 2023-03-18: qty 2

## 2023-03-18 MED ORDER — POLYETHYLENE GLYCOL 3350 17 G PO PACK
34.0000 g | PACK | ORAL | Status: AC
  Filled 2023-03-18: qty 2

## 2023-03-18 SURGICAL SUPPLY — 11 items
CATH INFINITI JR4 5F (CATHETERS) IMPLANT
CATH LAUNCHER 5F EBU3.5 (CATHETERS) IMPLANT
DEVICE RAD COMP TR BAND LRG (VASCULAR PRODUCTS) IMPLANT
DRAPE BRACHIAL (DRAPES) IMPLANT
GLIDESHEATH SLEND SS 6F .021 (SHEATH) IMPLANT
GUIDEWIRE INQWIRE 1.5J.035X260 (WIRE) IMPLANT
INQWIRE 1.5J .035X260CM (WIRE) ×1
PACK CARDIAC CATH (CUSTOM PROCEDURE TRAY) ×1 IMPLANT
PROTECTION STATION PRESSURIZED (MISCELLANEOUS) ×1
SET ATX-X65L (MISCELLANEOUS) IMPLANT
STATION PROTECTION PRESSURIZED (MISCELLANEOUS) IMPLANT

## 2023-03-18 NOTE — Progress Notes (Signed)
  PROGRESS NOTE    [name]  YSA:[id number] DOB: December 11, 1955 DOA: 03/17/2023 PCP: Karie Schwalbe, MD  ARCL/NONE  LOS: 0 days   Brief hospital course:   Assessment & Plan: [name] is a 68 y.o. male with medical history significant of morbid obesity, hyperlipidemia, hypertension, sleep apnea on CPAP, coronary artery disease, diastolic heart failure presenting with chest pain.    Noted to have been evaluated roughly within the past week with cardiology for similar symptoms.  Noted baseline CAD s/p Cardiac catheterization April 2023 showed 30% RCA stenosis, 50% second diagonal stenosis, 40% mid LAD stenosis.  Patient noted to have been given full dose aspirin as well as nitroglycerin with near complete resolution of symptoms.    Chest pain CAD Recurring episodes of CP w/ concern for unstable angina  Followed by Dr. Jens Som  --trop 20's flat.  Started on heparin gtt Plan: --cont ASA and statin --heart cath today  Chronic heart failure with preserved ejection fraction (HCC) 2D ECHO 04/2022 EF 60-65%, grade 1 DD  Euvolemic  --cont Toprol  Mixed hyperlipidemia statin  OSA on CPAP CPAP when sleeping  Hypothyroidism Cont synthroid    DVT prophylaxis: Lovenox SQ Code Status: Full code  Family Communication: wife updated at bedside today Level of care: Telemetry Cardiac Dispo:   The patient is from: home Anticipated d/c is to: home Anticipated d/c date is: tomorrow Patient currently is not medically ready to d/c due to: heart cath today   Subjective and Interval History:  No chest pain.  Pt reported constipation and hard stool.  Reported OSA and needing supplemental O2 when lying down (even when awake).   Objective: Vitals:   03/18/23 1451 03/18/23 1528 03/18/23 1622 03/18/23 1630  BP: 133/75  119/67 125/74  Pulse: (!) 58  (!) 55 (!) 57  Resp: 13  16 13   Temp: 98.4 F (36.9 C)     TempSrc: Oral     SpO2: 94% 96% 93% 93%  Weight:      Height:         Intake/Output Summary (Last 24 hours) at 03/18/2023 1731 Last data filed at 03/17/2023 1847 Gross per 24 hour  Intake --  Output 850 ml  Net -850 ml   Filed Weights   03/17/23 1112  Weight: 127 kg    Examination:   Constitutional: NAD, AAOx3 HEENT: conjunctivae and lids normal, EOMI CV: No cyanosis.   RESP: normal respiratory effort, on 2L Neuro: II - XII grossly intact.   Psych: Normal mood and affect.  Appropriate judgement and reason   Data Reviewed: I have personally reviewed labs and imaging studies  Time spent: 50 minutes  Darlin Priestly, MD [facility] If 7PM-7AM, please contact night-coverage 03/18/2023, 5:31 PM

## 2023-03-18 NOTE — Progress Notes (Signed)
   Patient Name: [name] Date of Encounter: 03/18/2023 [facility] Cardiologist: Olga Millers, MD   Interval Summary  .    No further chest pain since yesterday.  No dyspnea.  Vital Signs .    Vitals:   03/18/23 1400 03/18/23 1451 03/18/23 1528 03/18/23 1622  BP: 112/64 133/75  119/67  Pulse: (!) 58 (!) 58  (!) 55  Resp: 18 13  16   Temp: 98.3 F (36.8 C) 98.4 F (36.9 C)    TempSrc: Oral Oral    SpO2: 95% 94% 96% 93%  Weight:      Height:        Intake/Output Summary (Last 24 hours) at 03/18/2023 1635 Last data filed at 03/17/2023 1847 Gross per 24 hour  Intake --  Output 850 ml  Net -850 ml      03/17/2023   11:12 AM 03/10/2023    9:51 AM 03/06/2023    3:21 PM  Last 3 Weights  Weight (lbs) 280 lb 284 lb 6.4 oz 287 lb  Weight (kg) 127.007 kg 129.003 kg 130.182 kg      Telemetry/ECG    Normal sinus rhythm during catheterization and in specials/recovery.  Unable to review telemetry from floor at this time.- Personally Reviewed  Physical Exam .   GEN: No acute distress.   Neck: No JVD Cardiac: RRR, no murmurs, rubs, or gallops.  Respiratory: Clear to auscultation bilaterally. GI: Soft, nontender, non-distended  MS: No edema  Assessment & Plan .     Unstable angina: Patient presents with progressive chest pain and negligible, flat troponin elevation.  Catheterization today showed interval occlusion of small D2 branch, which appears subacute or chronic given collateralization.  Vessel is too small for PCI. -Resume isosorbide mononitrate 30 mg twice daily. -If heart rate and blood pressure allow, resume home dose of metoprolol. -Continue atorvastatin 40 mg daily. -Check LP(a); if elevated, addition of PCSK9 inhibitor should be considered as an outpatient.  Chronic HFpEF: LVEDP normal.  LVEF normal on today's echo. -Maintain net even fluid balance.  Disposition: Recommend monitoring overnight with resumption of isosorbide mononitrate +/-  metoprolol.  If patient remains chest pain-free, he can be discharged tomorrow.  For questions or updates, please contact [facility] Please consult [email or web address] for contact info under [facility].     Signed, Yvonne Kendall, MD
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
## 2023-03-18 NOTE — Progress Notes (Signed)
*  PRELIMINARY RESULTS* Echocardiogram 2D Echocardiogram has been performed.  Cristela Blue 03/18/2023, 8:44 AM

## 2023-03-18 NOTE — ED Notes (Signed)
Echo at bedside

## 2023-03-18 NOTE — ED Notes (Signed)
Patient undressed and in gown, clothing in belonging bag and with spouse at the bedside. Awaiting cath lab arrival.

## 2023-03-18 NOTE — Plan of Care (Signed)
  Problem: Cardiac: Goal: Ability to achieve and maintain adequate cardiovascular perfusion will improve Outcome: Progressing   Problem: Health Behavior/Discharge Planning: Goal: Ability to manage health-related needs will improve Outcome: Progressing   Problem: Elimination: Goal: Will not experience complications related to bowel motility Outcome: Progressing   Problem: Elimination: Goal: Will not experience complications related to urinary retention Outcome: Progressing   Problem: Activity: Goal: Ability to return to baseline activity level will improve Outcome: Progressing   Problem: Safety: Goal: Ability to remain free from injury will improve Outcome: Progressing

## 2023-03-18 NOTE — Interval H&P Note (Signed)
History and Physical Interval Note:  03/18/2023 3:31 PM  [name]  has presented today for surgery, with the diagnosis of chest pain.  The various methods of treatment have been discussed with the patient and family. After consideration of risks, benefits and other options for treatment, the patient has consented to  Procedure(s): LEFT HEART CATH AND CORONARY ANGIOGRAPHY (N/A) as a surgical intervention.  The patient's history has been reviewed, patient examined, no change in status, stable for surgery.  I have reviewed the patient's chart and labs.  Questions were answered to the patient's satisfaction.    Cath Lab Visit (complete for each Cath Lab visit)  Clinical Evaluation Leading to the Procedure:   ACS: Yes.    Non-ACS:  N/A   Arye Weyenberg
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
## 2023-03-19 ENCOUNTER — Encounter: Payer: Self-pay | Admitting: Internal Medicine

## 2023-03-19 DIAGNOSIS — I2081 Angina pectoris with coronary microvascular dysfunction: Secondary | ICD-10-CM | POA: Diagnosis not present

## 2023-03-19 DIAGNOSIS — I10 Essential (primary) hypertension: Secondary | ICD-10-CM | POA: Diagnosis not present

## 2023-03-19 DIAGNOSIS — I25110 Atherosclerotic heart disease of native coronary artery with unstable angina pectoris: Secondary | ICD-10-CM | POA: Diagnosis not present

## 2023-03-19 DIAGNOSIS — I5032 Chronic diastolic (congestive) heart failure: Secondary | ICD-10-CM | POA: Diagnosis not present

## 2023-03-19 LAB — BASIC METABOLIC PANEL
Anion gap: 4 — ABNORMAL LOW (ref 5–15)
BUN: 21 mg/dL (ref 8–23)
CO2: 27 mmol/L (ref 22–32)
Calcium: 8.3 mg/dL — ABNORMAL LOW (ref 8.9–10.3)
Chloride: 108 mmol/L (ref 98–111)
Creatinine, Ser: 0.9 mg/dL (ref 0.61–1.24)
GFR, Estimated: >60 mL/min (ref >60)
Glucose, Bld: 93 mg/dL (ref 70–99)
Potassium: 3.8 mmol/L (ref 3.5–5.1)
Sodium: 139 mmol/L (ref 135–145)

## 2023-03-19 LAB — CBC
HCT: 42.1 % (ref 39.0–52.0)
Hemoglobin: 13.8 g/dL (ref 13.0–17.0)
MCH: 28.3 pg (ref 26.0–34.0)
MCHC: 32.8 g/dL (ref 30.0–36.0)
MCV: 86.4 fL (ref 80.0–100.0)
Platelets: 217 10*3/uL (ref 150–400)
RBC: 4.87 MIL/uL (ref 4.22–5.81)
RDW: 13.2 % (ref 11.5–15.5)
WBC: 9.8 10*3/uL (ref 4.0–10.5)
nRBC: 0 % (ref 0.0–0.2)

## 2023-03-19 LAB — MAGNESIUM: Magnesium: 2.4 mg/dL (ref 1.7–2.4)

## 2023-03-19 MED ORDER — METOPROLOL SUCCINATE ER 25 MG PO TB24: 12.5000 mg | ORAL_TABLET | Freq: Every day | ORAL | 0 refills | Status: DC

## 2023-03-19 MED ORDER — METOPROLOL SUCCINATE ER 25 MG PO TB24: 12.5000 mg | ORAL_TABLET | Freq: Every day | ORAL | Status: DC

## 2023-03-19 MED ORDER — NITROGLYCERIN 0.3 MG SL SUBL: 0.3000 mg | SUBLINGUAL_TABLET | SUBLINGUAL | 0 refills | Status: DC | PRN

## 2023-03-19 MED ORDER — ISOSORBIDE MONONITRATE ER 30 MG PO TB24: 30.0000 mg | ORAL_TABLET | Freq: Two times a day (BID) | ORAL | 0 refills | Status: DC

## 2023-03-19 NOTE — Progress Notes (Signed)
Progress Note  Patient Name: [name] Date of Encounter: 03/19/2023  Primary Cardiologist: Jens Som  Subjective   LHC 03/18/23 showed severe single-vessel CAD with CTO of a small D2 branch that was too small for PCI. No further chest pain. No dyspnea or dizziness. Metoprolol held this morning for nocturnal bradycardia in the 50s bpm. No cath site complications.   Inpatient Medications    Scheduled Meds:  aspirin EC  81 mg Oral Daily   atorvastatin  40 mg Oral Daily   buPROPion  300 mg Oral q morning   DULoxetine  60 mg Oral q morning   enoxaparin (LOVENOX) injection  65 mg Subcutaneous Q24H   gabapentin  600 mg Oral BID   isosorbide mononitrate  30 mg Oral BID   levothyroxine  88 mcg Oral Q0600   [START ON 03/20/2023] metoprolol succinate  12.5 mg Oral Daily   sodium chloride flush  3 mL Intravenous Q12H   traZODone  200 mg Oral QHS   Continuous Infusions:  sodium chloride     PRN Meds: sodium chloride, acetaminophen, ondansetron (ZOFRAN) IV, sodium chloride flush   Vital Signs    Vitals:   03/19/23 0032 03/19/23 0531 03/19/23 0822 03/19/23 1142  BP: 98/72 102/70 113/63 115/71  Pulse: (!) 58  (!) 53 62  Resp: 18 18 20 20   Temp: 97.9 F (36.6 C) 97.7 F (36.5 C) 97.7 F (36.5 C) 98.5 F (36.9 C)  TempSrc: Oral Oral Oral Oral  SpO2: 97% 95% 93% 94%  Weight:      Height:        Intake/Output Summary (Last 24 hours) at 03/19/2023 1237 Last data filed at 03/19/2023 1011 Gross per 24 hour  Intake 957 ml  Output --  Net 957 ml   Filed Weights   03/17/23 1112  Weight: 127 kg    Telemetry    SR - Personally Reviewed  ECG    Not on tele - Personally Reviewed  Physical Exam   GEN: No acute distress.   Neck: No JVD. Cardiac: RRR, no murmurs, rubs, or gallops.  Respiratory: Clear to auscultation bilaterally.  GI: Soft, nontender, non-distended.   MS: No edema; No deformity. Neuro:  Alert and oriented x 3; Nonfocal.  Psych: Normal affect.  Labs     Chemistry Recent Labs  Lab 03/17/23 1205 03/19/23 0437  NA 139 139  K 4.0 3.8  CL 103 108  CO2 27 27  GLUCOSE 86 93  BUN 21 21  CREATININE 0.83 0.90  CALCIUM 9.0 8.3*  GFRNONAA >60 >60  ANIONGAP 9 4*     Hematology Recent Labs  Lab 03/17/23 1205 03/18/23 0635 03/19/23 0437  WBC 8.6 9.5 9.8  RBC 5.60 5.07 4.87  HGB 15.7 14.2 13.8  HCT 48.3 44.2 42.1  MCV 86.3 87.2 86.4  MCH 28.0 28.0 28.3  MCHC 32.5 32.1 32.8  RDW 13.2 13.3 13.2  PLT 229 213 217    Cardiac EnzymesNo results for input(s): "TROPONINI" in the last 168 hours. No results for input(s): "TROPIPOC" in the last 168 hours.   BNP Recent Labs  Lab 03/17/23 1205  BNP 44.8     DDimer No results for input(s): "DDIMER" in the last 168 hours.   Radiology        Cardiac Studies   LHC 03/18/2023: Conclusions: Severe single-vessel coronary artery disease with chronic total occlusion of small D2 branch, which has progressed since last catheterization in 11/2021 (stenosis was approximately 50% at that time).  Otherwise, stable appearance of mild-moderate, nonobstructive CAD. Normal left ventricular filling pressure.   Recommendations: Continue escalation of antianginal therapy, as tolerated.  Occluded D2 branch is too small for percutaneous intervention. Restart isosorbide mononitrate 30 mg twice daily. Aggressive secondary prevention of coronary artery diease. __________  2D echo 03/18/2023: 1. Left ventricular ejection fraction, by estimation, is 55 to 60%. The  left ventricle has normal function. Left ventricular endocardial border  not optimally defined to evaluate regional wall motion. There is moderate  left ventricular hypertrophy. Left  ventricular diastolic parameters are consistent with Grade I diastolic  dysfunction (impaired relaxation).   2. Right ventricular systolic function is normal. The right ventricular  size is normal. Tricuspid regurgitation signal is inadequate for assessing  PA  pressure.   3. Right atrial size was mildly dilated.   4. The mitral valve is normal in structure. No evidence of mitral valve  regurgitation. No evidence of mitral stenosis.   5. The aortic valve is normal in structure. Aortic valve regurgitation is  not visualized. No aortic stenosis is present.   6. Aortic dilatation noted. There is mild dilatation of the ascending  aorta, measuring 40 mm.   Patient Profile     67 y.o. male with history of CAD medically managed, HTN, HLD, chronic dyspnea, obesity, and OSA who we are seeing for unstable angina.   Assessment & Plan    1.  CAD involving native coronary arteries with stable angina: -No further chest pain or symptoms of cardiac decompensation -Cardiac cath this admission showed interval occlusion of a small D2 branch that appears subacute or chronic given collateralization and was too small for PCI -ASA -Lipitor as below -Continue current dose of Imdur 30 mg twice daily (patient was on Isordil prior to admission, though taking this just once daily) -Reduce Toprol-XL to 12.5 mg daily given bradycardia -Look to resume PTA amlodipine as blood pressure allows for further antianginal therapy  -Aggressive risk factor modification -LP(a) pending, if elevated, consider addition of PCSK9 inhibitor in the outpatient setting -Post cath instructions  -SL NTG discharge  2.  HFpEF: -LVEDP normal during cath this admission with preserved LV systolic function -Maintain net even fluid balance -PTA furosemide at discharge -Escalate GDMT as able in the outpatient setting  3.  HTN: -Blood pressure  4.  HLD: -LDL 29 this admission -Lipitor 40 mg       For questions or updates, please contact [facility] Please consult [email or web address] for contact info under Cardiology/STEMI.    Signed, Eula Listen, PA-C [name] Pager: [phone] 03/19/2023, 12:37 PM

## 2023-03-19 NOTE — Progress Notes (Signed)
Transition of Care [facility]) - Inpatient Brief Assessment   Patient Details  Name: [name] MRN: [id number] Date of Birth: 1956/05/31  Transition of Care [facility]) CM/SW Contact:    Truddie Hidden, RN Phone Number: 03/19/2023, 2:55 PM   Clinical Narrative: TOC assessing for ongoing needs and discharge planning.   Transition of Care Asessment: Insurance and Status: Insurance coverage has been reviewed Patient has primary care physician: Yes Home environment has been reviewed: Return home Prior level of function:: Independent Prior/Current Home Services: No current home services Social Determinants of Health Reivew: SDOH reviewed no interventions necessary Readmission risk has been reviewed: Yes Transition of care needs: no transition of care needs at this time

## 2023-03-19 NOTE — Discharge Summary (Addendum)
Physician Discharge Summary  [name] ONG:[id number] DOB: [birth year] DOA: 03/17/2023  PCP: Karie Schwalbe, MD  Admit date: 03/17/2023 Discharge date: 03/19/2023  Admitted From: home  Disposition:  home   Recommendations for Outpatient Follow-up:  Follow up with PCP in 1-2 weeks F/u w/ cardio, Dr. Jens Som, in 1-2 weeks   Home Health: no  Equipment/Devices:  Discharge Condition: stable  CODE STATUS: full  Diet recommendation: Heart Healthy  Brief/Interim Summary: HPI was taken from Dr. Alvester Morin: [name] is a 67 y.o. male with medical history significant of obesity, angina, morbid obesity, hyperlipidemia, hypertension, sleep apnea coronary artery disease, diastolic heart failure presenting with chest pain.  Patient reports having significant episode of chest pain earlier this morning.  Patient reports being at rest when he developed sudden left-sided chest pain radiation up the neck and down the left arm.  Symptoms lasted for approximately 1 hour.  Noted to have been evaluated roughly within the past week with cardiology for similar symptoms.  Had a stress test ordered that is tentatively scheduled for 7/24. Noted baseline CAD s/p Cardiac catheterization April 2023 showed 30% RCA stenosis, 50% second diagonal stenosis, 40% mid LAD stenosis.  Patient noted to have been given full dose aspirin as well as nitroglycerin with near complete resolution of symptoms.  No shortness of breath, orthopnea, PND. Presented to the ER afebrile, hemodynamically stable.  Labs notable for troponin 27.  EKG normal sinus rhythm.  BMP within normal limits.  Chest x-ray stable.    Discharge Diagnoses:  Principal Problem:   Chest pain Active Problems:   Chronic heart failure with preserved ejection fraction (HCC)   Hypothyroidism   OSA on CPAP   Mixed hyperlipidemia   Essential hypertension   Unstable angina (HCC)  Chest pain: w/ hx of CAD. S/p cardiac cath w/ occluded small diagonal branch which  was too small for stents and medical management as per cardio. Continue on decreased dose of metoprolol, increased dose of imdur, statin & aspirin   Chronic diastolic CHF: appears euvolemic. Continue on decreased dose of metoprolol Echo in 2023 showed EF 60-65%, grade I diastolic dysfunction   HLD: continue on statin   OSA: CPAP qhs  Hypothyroidism: continue on synthroid   Morbid obesity: BMI 40.1. Complicates overall care & prognosis    Discharge Instructions  Discharge Instructions     Diet - low sodium heart healthy   Complete by: As directed    Discharge instructions   Complete by: As directed    F/u w/ PCP in 1-2 weeks. F/u w/ cardio, Dr. Jens Som, in 1-2 weeks   Increase activity slowly   Complete by: As directed       Allergies as of 03/19/2023       Reactions   Amoxicillin Hives   Penicillins Hives   Has patient had a PCN reaction causing immediate rash, facial/tongue/throat swelling, SOB or lightheadedness with hypotension: No Has patient had a PCN reaction causing severe rash involving mucus membranes or skin necrosis: Yes Has patient had a PCN reaction that required hospitalization: No Has patient had a PCN reaction occurring within the last 10 years: No If all of the above answers are "NO", then may proceed with Cephalosporin use.        Medication List     STOP taking these medications    isosorbide dinitrate 30 MG tablet Commonly known as: ISORDIL       TAKE these medications    amLODipine 5 MG tablet Commonly known as:  NORVASC Take 1 tablet (5 mg total) by mouth daily. What changed: how much to take   aspirin EC 81 MG tablet Take 1 tablet (81 mg total) by mouth daily.   atorvastatin 40 MG tablet Commonly known as: LIPITOR Take 1 tablet (40 mg total) by mouth daily.   buPROPion 300 MG 24 hr tablet Commonly known as: WELLBUTRIN XL Take 1 tablet (300 mg total) by mouth every morning.   cyanocobalamin 1000 MCG tablet Commonly known as:  VITAMIN B12 Take 1 tablet (1,000 mcg total) by mouth daily.   cyclobenzaprine 10 MG tablet Commonly known as: FLEXERIL Take 10 mg by mouth daily as needed for muscle spasms.   DULoxetine 60 MG capsule Commonly known as: CYMBALTA Take 1 capsule (60 mg total) by mouth every morning.   furosemide 20 MG tablet Commonly known as: LASIX TAKE 1 TABLET DAILY (NEED TO SCHEDULE AN IN OFFICE APPOINTMENT FOR FUTURE REFILLS) What changed:  how much to take how to take this when to take this additional instructions   gabapentin 600 MG tablet Commonly known as: NEURONTIN Take 2 tablets (1,200 mg total) by mouth at bedtime. What changed:  how much to take when to take this   isosorbide mononitrate 30 MG 24 hr tablet Commonly known as: IMDUR Take 1 tablet (30 mg total) by mouth 2 (two) times daily.   levothyroxine 88 MCG tablet Commonly known as: SYNTHROID Take 1 tablet (88 mcg total) by mouth daily before breakfast.   metoprolol succinate 25 MG 24 hr tablet Commonly known as: TOPROL-XL Take 0.5 tablets (12.5 mg total) by mouth daily. Start taking on: March 20, 2023 What changed: how much to take   mometasone 50 MCG/ACT nasal spray Commonly known as: NASONEX Place 2 sprays into the nose daily as needed (allergies).   Multi For Him 50+ Tabs Take 1 tablet by mouth 4 (four) times a week.   nitroGLYCERIN 0.3 MG SL tablet Commonly known as: Nitrostat Place 1 tablet (0.3 mg total) under the tongue every 5 (five) minutes as needed for chest pain. Max of 3 doses within 15 minutes   traZODone 100 MG tablet Commonly known as: DESYREL Take 2 tablets (200 mg total) by mouth at bedtime.   Vitamin D3 50 MCG (2000 UT) capsule Take 1 capsule (2,000 Units total) by mouth daily.        Allergies  Allergen Reactions   Amoxicillin Hives   Penicillins Hives    Has patient had a PCN reaction causing immediate rash, facial/tongue/throat swelling, SOB or lightheadedness with hypotension:  No Has patient had a PCN reaction causing severe rash involving mucus membranes or skin necrosis: Yes Has patient had a PCN reaction that required hospitalization: No Has patient had a PCN reaction occurring within the last 10 years: No If all of the above answers are "NO", then may proceed with Cephalosporin use.     Consultations: Cardio    Procedures/Studies: CARDIAC CATHETERIZATION  Result Date: 03/18/2023 Conclusions: Severe single-vessel coronary artery disease with chronic total occlusion of small D2 branch, which has progressed since last catheterization in 11/2021 (stenosis was approximately 50% at that time).  Otherwise, stable appearance of mild-moderate, nonobstructive CAD. Normal left ventricular filling pressure. Recommendations: Continue escalation of antianginal therapy, as tolerated.  Occluded D2 branch is too small for percutaneous intervention. Restart isosorbide mononitrate 30 mg twice daily. Aggressive secondary prevention of coronary artery diease. Yvonne Kendall, MD [facility]  ECHOCARDIOGRAM COMPLETE  Result Date: 03/18/2023    ECHOCARDIOGRAM REPORT  Patient Name:   [name] Date of Exam: 03/18/2023 Medical Rec #:  [id number]     Height:       70.0 in Accession #:    [id number]    Weight:       280.0 lb Date of Birth:  1956-05-05     BSA:          2.408 m Patient Age:    66 years      BP:           146/91 mmHg Patient Gender: M             HR:           53 bpm. Exam Location:  [facility] Procedure: 2D Echo, Cardiac Doppler and Color Doppler Indications:     Chest pain R07.9  History:         Patient has prior history of Echocardiogram examinations, most                  recent 05/24/2022. Risk Factors:Hypertension and Sleep Apnea.  Sonographer:     Cristela Blue Referring Phys:  [id number] CADENCE H FURTH Diagnosing Phys: Lorine Bears MD  Sonographer Comments: Suboptimal apical window. IMPRESSIONS  1. Left ventricular ejection fraction, by estimation, is 55 to 60%. The left  ventricle has normal function. Left ventricular endocardial border not optimally defined to evaluate regional wall motion. There is moderate left ventricular hypertrophy. Left ventricular diastolic parameters are consistent with Grade I diastolic dysfunction (impaired relaxation).  2. Right ventricular systolic function is normal. The right ventricular size is normal. Tricuspid regurgitation signal is inadequate for assessing PA pressure.  3. Right atrial size was mildly dilated.  4. The mitral valve is normal in structure. No evidence of mitral valve regurgitation. No evidence of mitral stenosis.  5. The aortic valve is normal in structure. Aortic valve regurgitation is not visualized. No aortic stenosis is present.  6. Aortic dilatation noted. There is mild dilatation of the ascending aorta, measuring 40 mm. FINDINGS  Left Ventricle: Left ventricular ejection fraction, by estimation, is 55 to 60%. The left ventricle has normal function. Left ventricular endocardial border not optimally defined to evaluate regional wall motion. The left ventricular internal cavity size was normal in size. There is moderate left ventricular hypertrophy. Left ventricular diastolic parameters are consistent with Grade I diastolic dysfunction (impaired relaxation). Right Ventricle: The right ventricular size is normal. No increase in right ventricular wall thickness. Right ventricular systolic function is normal. Tricuspid regurgitation signal is inadequate for assessing PA pressure. Left Atrium: Left atrial size was normal in size. Right Atrium: Right atrial size was mildly dilated. Pericardium: There is no evidence of pericardial effusion. Mitral Valve: The mitral valve is normal in structure. No evidence of mitral valve regurgitation. No evidence of mitral valve stenosis. MV peak gradient, 3.6 mmHg. The mean mitral valve gradient is 2.0 mmHg. Tricuspid Valve: The tricuspid valve is normal in structure. Tricuspid valve regurgitation  is not demonstrated. No evidence of tricuspid stenosis. Aortic Valve: The aortic valve is normal in structure. Aortic valve regurgitation is not visualized. No aortic stenosis is present. Aortic valve mean gradient measures 2.0 mmHg. Aortic valve peak gradient measures 5.2 mmHg. Aortic valve area, by VTI measures 2.55 cm. Pulmonic Valve: The pulmonic valve was normal in structure. Pulmonic valve regurgitation is not visualized. No evidence of pulmonic stenosis. Aorta: Aortic dilatation noted. There is mild dilatation of the ascending aorta, measuring 40 mm. Venous: The inferior vena cava was  not well visualized. IAS/Shunts: No atrial level shunt detected by color flow Doppler.  LEFT VENTRICLE PLAX 2D LVIDd:         5.30 cm   Diastology LVIDs:         3.60 cm   LV e' medial:    5.66 cm/s LV PW:         1.50 cm   LV E/e' medial:  11.9 LV IVS:        1.50 cm   LV e' lateral:   4.57 cm/s LVOT diam:     2.00 cm   LV E/e' lateral: 14.7 LV SV:         55 LV SV Index:   23 LVOT Area:     3.14 cm  RIGHT VENTRICLE RV Basal diam:  4.30 cm RV Mid diam:    3.90 cm LEFT ATRIUM             Index        RIGHT ATRIUM           Index LA diam:        2.80 cm 1.16 cm/m   RA Area:     21.60 cm LA Vol (A2C):   43.1 ml 17.90 ml/m  RA Volume:   62.00 ml  25.75 ml/m LA Vol (A4C):   41.7 ml 17.32 ml/m LA Biplane Vol: 45.9 ml 19.06 ml/m  AORTIC VALVE AV Area (Vmax):    2.13 cm AV Area (Vmean):   2.44 cm AV Area (VTI):     2.55 cm AV Vmax:           114.00 cm/s AV Vmean:          70.700 cm/s AV VTI:            0.214 m AV Peak Grad:      5.2 mmHg AV Mean Grad:      2.0 mmHg LVOT Vmax:         77.30 cm/s LVOT Vmean:        54.800 cm/s LVOT VTI:          0.174 m LVOT/AV VTI ratio: 0.81  AORTA Ao Root diam: 3.77 cm MITRAL VALVE MV Area (PHT): 2.74 cm    SHUNTS MV Area VTI:   1.61 cm    Systemic VTI:  0.17 m MV Peak grad:  3.6 mmHg    Systemic Diam: 2.00 cm MV Mean grad:  2.0 mmHg MV Vmax:       0.95 m/s MV Vmean:      59.7 cm/s MV  Decel Time: 277 msec MV E velocity: 67.10 cm/s MV A velocity: 88.60 cm/s MV E/A ratio:  0.76 Lorine Bears MD Electronically signed by Lorine Bears MD Signature Date/Time: 03/18/2023/12:59:16 PM    Final    DG Chest 2 View  Result Date: 03/17/2023 CLINICAL DATA:  Chest pain EXAM: CHEST - 2 VIEW COMPARISON:  X-ray 10/11/2017 FINDINGS: No consolidation, pneumothorax or effusion. Normal cardiopericardial silhouette without edema. Film is under penetrated. IMPRESSION: No acute cardiopulmonary disease Electronically Signed   By: Karen Kays M.D.   On: 03/17/2023 12:25   (Echo, Carotid, EGD, Colonoscopy, ERCP)    Subjective: Pt denies any chest pain or shortness of breath    Discharge Exam: Vitals:   03/19/23 0822 03/19/23 1142  BP: 113/63 115/71  Pulse: (!) 53 62  Resp: 20 20  Temp: 97.7 F (36.5 C) 98.5 F (36.9 C)  SpO2: 93% 94%   Vitals:  03/19/23 0032 03/19/23 0531 03/19/23 0822 03/19/23 1142  BP: 98/72 102/70 113/63 115/71  Pulse: (!) 58  (!) 53 62  Resp: 18 18 20 20   Temp: 97.9 F (36.6 C) 97.7 F (36.5 C) 97.7 F (36.5 C) 98.5 F (36.9 C)  TempSrc: Oral Oral Oral Oral  SpO2: 97% 95% 93% 94%  Weight:      Height:        General: Pt is alert, awake, not in acute distress Cardiovascular: S1/S2 +, no rubs, no gallops Respiratory: CTA bilaterally, no wheezing, no rhonchi Abdominal: Soft, NT, obese, bowel sounds + Extremities: no cyanosis    The results of significant diagnostics from this hospitalization (including imaging, microbiology, ancillary and laboratory) are listed below for reference.     Microbiology: No results found for this or any previous visit (from the past 240 hour(s)).   Labs: BNP (last 3 results) Recent Labs    03/10/23 1141 03/17/23 1205  BNP 15.8 44.8   Basic Metabolic Panel: Recent Labs  Lab 03/17/23 1205 03/19/23 0437  NA 139 139  K 4.0 3.8  CL 103 108  CO2 27 27  GLUCOSE 86 93  BUN 21 21  CREATININE 0.83 0.90  CALCIUM  9.0 8.3*  MG  --  2.4   Liver Function Tests: No results for input(s): "AST", "ALT", "ALKPHOS", "BILITOT", "PROT", "ALBUMIN" in the last 168 hours. No results for input(s): "LIPASE", "AMYLASE" in the last 168 hours. No results for input(s): "AMMONIA" in the last 168 hours. CBC: Recent Labs  Lab 03/17/23 1205 03/18/23 0635 03/19/23 0437  WBC 8.6 9.5 9.8  NEUTROABS 6.9  --   --   HGB 15.7 14.2 13.8  HCT 48.3 44.2 42.1  MCV 86.3 87.2 86.4  PLT 229 213 217   Cardiac Enzymes: No results for input(s): "CKTOTAL", "CKMB", "CKMBINDEX", "TROPONINI" in the last 168 hours. BNP: Invalid input(s): "POCBNP" CBG: No results for input(s): "GLUCAP" in the last 168 hours. D-Dimer No results for input(s): "DDIMER" in the last 72 hours. Hgb A1c No results for input(s): "HGBA1C" in the last 72 hours. Lipid Profile Recent Labs    03/18/23 0635  CHOL 60  HDL 20*  LDLCALC 29  TRIG 56  CHOLHDL 3.0   Thyroid function studies No results for input(s): "TSH", "T4TOTAL", "T3FREE", "THYROIDAB" in the last 72 hours.  Invalid input(s): "FREET3" Anemia work up No results for input(s): "VITAMINB12", "FOLATE", "FERRITIN", "TIBC", "IRON", "RETICCTPCT" in the last 72 hours. Urinalysis    Component Value Date/Time   COLORURINE YELLOW (A) 01/08/2022 1900   APPEARANCEUR Clear 02/25/2023 1533   LABSPEC 1.014 01/08/2022 1900   PHURINE 6.0 01/08/2022 1900   GLUCOSEU Negative 02/25/2023 1533   HGBUR NEGATIVE 01/08/2022 1900   BILIRUBINUR Negative 02/25/2023 1533   KETONESUR NEGATIVE 01/08/2022 1900   PROTEINUR Negative 02/25/2023 1533   PROTEINUR NEGATIVE 01/08/2022 1900   NITRITE Negative 02/25/2023 1533   NITRITE NEGATIVE 01/08/2022 1900   LEUKOCYTESUR Trace (A) 02/25/2023 1533   LEUKOCYTESUR NEGATIVE 01/08/2022 1900   Sepsis Labs Recent Labs  Lab 03/17/23 1205 03/18/23 0635 03/19/23 0437  WBC 8.6 9.5 9.8   Microbiology No results found for this or any previous visit (from the past 240  hour(s)).   Time coordinating discharge: Over 30 minutes  SIGNED:   Charise Killian, MD  [facility] 03/19/2023, 1:43 PM Pager   If 7PM-7AM, please contact night-coverage [email or web address]

## 2023-03-20 ENCOUNTER — Encounter (HOSPITAL_COMMUNITY): Payer: Self-pay

## 2023-03-20 ENCOUNTER — Ambulatory Visit (HOSPITAL_COMMUNITY): Payer: Medicare HMO

## 2023-03-20 LAB — LIPOPROTEIN A (LPA): Lipoprotein (a): 25.3 nmol/L (ref <75.0)

## 2023-03-21 ENCOUNTER — Ambulatory Visit (HOSPITAL_COMMUNITY): Payer: Medicare HMO

## 2023-03-21 ENCOUNTER — Telehealth: Payer: Self-pay | Admitting: Cardiology

## 2023-03-21 NOTE — Telephone Encounter (Signed)
Pt is calling in regards to getting a sooner f/u appointment from a hospital stay. Was in the hospital for a few days and had some testing done. He does have some minor diagnosis that is stable and being treating. Per Dr. Royann Shivers pt is stable and the appointment he has for August 27 is fine. Patient wants the office to know he is very very upset that his wife got in for a follow up sooner than his appointment and feels we are tagging him as a 'hypochondriac" and pushing him to the side.

## 2023-03-21 NOTE — Telephone Encounter (Signed)
Patient is requesting provider switch from Dr. Jens Som to Dr. Okey Dupre due to wanting to switch to the [facility] location.

## 2023-03-21 NOTE — Telephone Encounter (Signed)
Patient is requesting call back to get a sooner appt for hospital f/u. See MyChart message.

## 2023-03-21 NOTE — Telephone Encounter (Signed)
That is fine with me.  Kevin End, MD [facility]  

## 2023-03-24 ENCOUNTER — Ambulatory Visit (INDEPENDENT_AMBULATORY_CARE_PROVIDER_SITE_OTHER): Payer: Medicare HMO | Admitting: Internal Medicine

## 2023-03-24 ENCOUNTER — Encounter (INDEPENDENT_AMBULATORY_CARE_PROVIDER_SITE_OTHER): Payer: Self-pay | Admitting: Internal Medicine

## 2023-03-24 VITALS — BP 110/74 | HR 61 | Temp 98.2°F | Ht 70.0 in | Wt 278.0 lb

## 2023-03-24 DIAGNOSIS — Z6839 Body mass index (BMI) 39.0-39.9, adult: Secondary | ICD-10-CM | POA: Diagnosis not present

## 2023-03-24 DIAGNOSIS — E66813 Obesity, class 3: Secondary | ICD-10-CM

## 2023-03-24 DIAGNOSIS — I10 Essential (primary) hypertension: Secondary | ICD-10-CM | POA: Diagnosis not present

## 2023-03-24 DIAGNOSIS — E669 Obesity, unspecified: Secondary | ICD-10-CM

## 2023-03-24 DIAGNOSIS — G4733 Obstructive sleep apnea (adult) (pediatric): Secondary | ICD-10-CM | POA: Diagnosis not present

## 2023-03-24 NOTE — Assessment & Plan Note (Signed)
He reports good compliance with CPAP therapy.  Was recently seen by sleep specialist no adjustments in settings required.  He has lost 15% of baseline body weight and may be a candidate for repeat sleep study when he reaches 20%.  Continue with nutrition and behavioral strategies for weight loss.

## 2023-03-24 NOTE — Assessment & Plan Note (Addendum)
Patient has lost 16 % of his baseline body weight with lifestyle changes alone.  His physical activity levels have declined due to orthopedic problems and recent heart attack.  He is very eager to having his surgery the requiring of him to have a BMI under 40.  Unfortunately he recently had a heart attack and therefore likely would have to hold off having any orthopedic surgery in the near future.  He may also benefit from incretin therapy to reduce the risk of Mace.  Patient has done a good job getting back on track with nutritional plan.

## 2023-03-24 NOTE — Assessment & Plan Note (Addendum)
Blood pressure at goal for age and risk category.  On amlodipine 5 mg a day, metoprolol, isosorbide without adverse effects.    Continue with weight loss therapy.  Monitor for symptoms of orthostasis while losing weight. Continue current regimen and home monitoring for a goal blood pressure of 120/80.
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
## 2023-03-25 ENCOUNTER — Ambulatory Visit (INDEPENDENT_AMBULATORY_CARE_PROVIDER_SITE_OTHER): Payer: Medicare HMO | Admitting: Internal Medicine

## 2023-03-25 ENCOUNTER — Encounter: Payer: Self-pay | Admitting: Medical

## 2023-03-25 ENCOUNTER — Encounter: Payer: Self-pay | Admitting: Internal Medicine

## 2023-03-25 ENCOUNTER — Ambulatory Visit: Payer: Medicare HMO | Attending: Medical | Admitting: Medical

## 2023-03-25 VITALS — BP 124/84 | HR 58 | Temp 97.6°F | Ht 70.25 in | Wt 283.0 lb

## 2023-03-25 VITALS — BP 118/74 | HR 63 | Ht 70.0 in | Wt 283.6 lb

## 2023-03-25 DIAGNOSIS — G4733 Obstructive sleep apnea (adult) (pediatric): Secondary | ICD-10-CM

## 2023-03-25 DIAGNOSIS — F3342 Major depressive disorder, recurrent, in full remission: Secondary | ICD-10-CM

## 2023-03-25 DIAGNOSIS — G479 Sleep disorder, unspecified: Secondary | ICD-10-CM | POA: Diagnosis not present

## 2023-03-25 DIAGNOSIS — Z0000 Encounter for general adult medical examination without abnormal findings: Secondary | ICD-10-CM | POA: Diagnosis not present

## 2023-03-25 DIAGNOSIS — I5032 Chronic diastolic (congestive) heart failure: Secondary | ICD-10-CM

## 2023-03-25 DIAGNOSIS — I25119 Atherosclerotic heart disease of native coronary artery with unspecified angina pectoris: Secondary | ICD-10-CM

## 2023-03-25 DIAGNOSIS — I25118 Atherosclerotic heart disease of native coronary artery with other forms of angina pectoris: Secondary | ICD-10-CM | POA: Diagnosis not present

## 2023-03-25 DIAGNOSIS — I10 Essential (primary) hypertension: Secondary | ICD-10-CM

## 2023-03-25 DIAGNOSIS — E782 Mixed hyperlipidemia: Secondary | ICD-10-CM | POA: Diagnosis not present

## 2023-03-25 MED ORDER — ISOSORBIDE MONONITRATE ER 30 MG PO TB24: 30.0000 mg | ORAL_TABLET | Freq: Two times a day (BID) | ORAL | 3 refills | Status: DC

## 2023-03-25 MED ORDER — GABAPENTIN 600 MG PO TABS
1200.0000 mg | ORAL_TABLET | Freq: Every day | ORAL | 3 refills | Status: AC
  Filled 2023-09-16: qty 180, 90d supply, fill #0
  Filled 2023-12-11: qty 180, 90d supply, fill #1
  Filled 2024-01-18 – 2024-03-10 (×3): qty 180, 90d supply, fill #2

## 2023-03-25 MED ORDER — TRAZODONE HCL 100 MG PO TABS
200.0000 mg | ORAL_TABLET | Freq: Every day | ORAL | 3 refills | Status: DC
Start: 2023-03-25 — End: 2023-06-01

## 2023-03-25 NOTE — Progress Notes (Signed)
Hearing Screening - Comments:: Checked in the last year. Has OTC amplifiers Vision Screening - Comments:: May 2024

## 2023-03-25 NOTE — Assessment & Plan Note (Signed)
Doing well with duloxetine 60 and bupropion 300mg  daily No wean for now

## 2023-03-25 NOTE — Assessment & Plan Note (Signed)
Compensated with heart meds and the daily furosemide 20mg 

## 2023-03-25 NOTE — Patient Instructions (Signed)
Medication Instructions:  Your physician recommends that you continue on your current medications as directed. Please refer to the Current Medication list given to you today.  *If you need a refill on your cardiac medications before your next appointment, please call your pharmacy*   Lab Work: None ordered today   Testing/Procedures: None ordered today   Follow-Up: At [facility], you and your health needs are our priority.  As part of our continuing mission to provide you with exceptional heart care, we have created designated Provider Care Teams.  These Care Teams include your primary Cardiologist (physician) and Advanced Practice Providers (APPs -  Physician Assistants and Nurse Practitioners) who all work together to provide you with the care you need, when you need it.  We recommend signing up for the patient portal called "MyChart".  Sign up information is provided on this After Visit Summary.  MyChart is used to connect with patients for Virtual Visits (Telemedicine).  Patients are able to view lab/test results, encounter notes, upcoming appointments, etc.  Non-urgent messages can be sent to your provider as well.   To learn more about what you can do with MyChart, go to [email or web address].    Your next appointment:   2 month(s)  Provider:   You may see Olga Millers, MD or one of the following Advanced Practice Providers on your designated Care Team:   Nicolasa Ducking, NP Eula Listen, PA-C Cadence Fransico Michael, PA-C Charlsie Quest, NP
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
## 2023-03-25 NOTE — Assessment & Plan Note (Signed)
Does well with nightly CPAP

## 2023-03-25 NOTE — Assessment & Plan Note (Signed)
I have personally reviewed the Medicare Annual Wellness questionnaire and have noted 1. The patient's medical and social history 2. Their use of alcohol, tobacco or illicit drugs 3. Their current medications and supplements 4. The patient's functional ability including ADL's, fall risks, home safety risks and hearing or visual             impairment. 5. Diet and physical activities 6. Evidence for depression or mood disorders  The patients weight, height, BMI and visual acuity have been recorded in the chart I have made referrals, counseling and provided education to the patient based review of the above and I have provided the pt with a written personalized care plan for preventive services.  I have provided you with a copy of your personalized plan for preventive services. Please take the time to review along with your updated medication list.  Has been exercising regularly Colon due again 2029 Just had PSA Needs second shingrix Updated flu vaccine in the fall--he will consider COVID (but not likely) Consider RSV as well
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
## 2023-03-25 NOTE — Assessment & Plan Note (Signed)
Recent cath with medical rx recommended Now on isosorbide 30 bid Atorvastatin 40, metoprolol 12/5 daily

## 2023-03-25 NOTE — Assessment & Plan Note (Signed)
Has lost 50# overall with lifestyle Offered wegovy--he isn't excited and I wouldn't recommend unless his lifestyle measures stop working

## 2023-03-28 ENCOUNTER — Encounter: Payer: Self-pay | Admitting: Internal Medicine

## 2023-03-29 ENCOUNTER — Other Ambulatory Visit: Payer: Self-pay | Admitting: Nurse Practitioner

## 2023-03-29 DIAGNOSIS — J0100 Acute maxillary sinusitis, unspecified: Secondary | ICD-10-CM

## 2023-04-01 ENCOUNTER — Other Ambulatory Visit: Payer: Self-pay | Admitting: *Deleted

## 2023-04-01 DIAGNOSIS — I2089 Other forms of angina pectoris: Secondary | ICD-10-CM

## 2023-04-04 ENCOUNTER — Encounter: Payer: Self-pay | Admitting: Orthopaedic Surgery

## 2023-04-08 ENCOUNTER — Ambulatory Visit: Payer: Medicare HMO | Admitting: Orthopaedic Surgery

## 2023-04-08 VITALS — Ht 70.0 in | Wt 282.0 lb

## 2023-04-08 DIAGNOSIS — G8929 Other chronic pain: Secondary | ICD-10-CM

## 2023-04-08 DIAGNOSIS — M25562 Pain in left knee: Secondary | ICD-10-CM

## 2023-04-08 DIAGNOSIS — M1712 Unilateral primary osteoarthritis, left knee: Secondary | ICD-10-CM

## 2023-04-08 NOTE — Progress Notes (Signed)
The patient is a 67 year old gentleman with unfortunately debilitating arthritis involving his left knee.  He is in need of knee replacement surgery.  He has been on a weight loss journey.  His BMI used to be between 45 and 49.  It is calm down and recently apparently it was below 40.  The community standard including hospital recommendations and insurance recommendations are for total joint replacements to be performed only when a BMI is below 40.  Today in the office his BMI is 40.89.  He does go to a weight loss clinic as well.  He understands that we are still unable to schedule surgery until his BMI is absolutely below 40.  Today in the office he weighs 285 pounds.  We did have him take off his shoes and take anything out of his pockets.  Reweight had him down to 282 pounds with a BMI 40.46.  He does have arthritis in his right knee but it is the left knee that is significantly worse.  He is going to come back and see [place] in 2 weeks for repeat weight and BMI calculation.  If he is below 40 we can work on getting him scheduled for surgery for a left knee replacement.

## 2023-04-09 ENCOUNTER — Encounter: Payer: Self-pay | Admitting: Internal Medicine

## 2023-04-09 DIAGNOSIS — L989 Disorder of the skin and subcutaneous tissue, unspecified: Secondary | ICD-10-CM

## 2023-04-21 ENCOUNTER — Ambulatory Visit (INDEPENDENT_AMBULATORY_CARE_PROVIDER_SITE_OTHER): Payer: Medicare HMO | Admitting: Internal Medicine

## 2023-04-21 ENCOUNTER — Encounter (INDEPENDENT_AMBULATORY_CARE_PROVIDER_SITE_OTHER): Payer: Self-pay | Admitting: Internal Medicine

## 2023-04-21 VITALS — BP 110/82 | HR 57 | Temp 97.9°F | Ht 70.0 in | Wt 280.0 lb

## 2023-04-21 DIAGNOSIS — G4733 Obstructive sleep apnea (adult) (pediatric): Secondary | ICD-10-CM | POA: Diagnosis not present

## 2023-04-21 DIAGNOSIS — E669 Obesity, unspecified: Secondary | ICD-10-CM

## 2023-04-21 DIAGNOSIS — I25119 Atherosclerotic heart disease of native coronary artery with unspecified angina pectoris: Secondary | ICD-10-CM

## 2023-04-21 DIAGNOSIS — Z6841 Body Mass Index (BMI) 40.0 and over, adult: Secondary | ICD-10-CM

## 2023-04-21 DIAGNOSIS — I10 Essential (primary) hypertension: Secondary | ICD-10-CM

## 2023-04-21 NOTE — Assessment & Plan Note (Signed)
Patient has lost 16 % of his baseline body weight with lifestyle changes alone.  His physical activity levels have declined due to orthopedic problems and recent heart attack but has started to go to the gym.  He denies any chest pain.  He is very eager to having his surgery but considering recent heart attack this has been delayed.  He may also benefit from incretin therapy to reduce the risk of Mace.  He is not interested at present time.  Patient has done a good job getting back on track with nutritional plan.  He has been tracking and journaling and staying under 1500 cal.  He is also getting about 100 g of protein per day.  Continue medically supervised weight loss plan.

## 2023-04-21 NOTE — Progress Notes (Signed)
Office: [phone]  /  Fax: [phone]  WEIGHT SUMMARY AND BIOMETRICS  Vitals Temp: 97.9 F (36.6 C) BP: 110/82 Pulse Rate: (!) 57 SpO2: 95 %   Anthropometric Measurements Height: 5\' 10"  (1.778 m) Weight: 280 lb (127 kg) BMI (Calculated): 40.18 Weight at Last Visit: 278 lb Weight Lost Since Last Visit: 0 lb Weight Gained Since Last Visit: 2 lb Starting Weight: 320 lb Total Weight Loss (lbs): 40 lb (18.1 kg) Peak Weight: 328 lb   Body Composition  Body Fat %: 40.5 % Fat Mass (lbs): 113.6 lbs Muscle Mass (lbs): 158.8 lbs Total Body Water (lbs): 116 lbs Visceral Fat Rating : 27    No data recorded Today's Visit #: 13  Starting Date: 07/09/22   HPI  Chief Complaint: OBESITY  [name] is here to discuss his progress with his obesity treatment plan. He is on the the Category 3 Plan and states he is following his eating plan approximately 85 % of the time. He states he is exercising 30 minutes 3 times per week.  Interval History:  Since last office visit he has gained 2 lbs. He acknowledges some recent indulgence during his birthday. He reports good adherence to reduced calorie nutritional plan. He has been working on not skipping meals, increasing protein intake at every meal, avoiding and / or reducing liquid calories, journaling and tracking calories, and getting back on track following recent lapse  Orixegenic Control: Denies problems with appetite and hunger signals.  Denies problems with satiety and satiation.  Denies problems with eating patterns and portion control.  Denies abnormal cravings. Denies feeling deprived or restricted.   Barriers identified: orthopedic problems, medical conditions or chronic pain affecting mobility, medical comorbidities, and presence of obesogenic drugs.   Pharmacotherapy for weight loss: He is currently taking no anti-obesity medication.    ASSESSMENT AND PLAN  TREATMENT PLAN FOR OBESITY:  Recommended Dietary  Goals  [name] is currently in the action stage of change. As such, his goal is to continue weight management plan. He has agreed to: continue current plan  Behavioral Intervention  We discussed the following Behavioral Modification Strategies today: increasing lean protein intake, decreasing simple carbohydrates , increasing vegetables, increasing lower glycemic fruits, increasing fiber rich foods, increasing water intake, continue to practice mindfulness when eating, and planning for success.  Additional resources provided today: None  Recommended Physical Activity Goals  [name] has been advised to work up to 150 minutes of moderate intensity aerobic activity a week and strengthening exercises 2-3 times per week for cardiovascular health, weight loss maintenance and preservation of muscle mass.   He has agreed to :  Will be increasing PA volume next 4 weeks  Pharmacotherapy We discussed various medication options to help [name] with his weight loss efforts and we both agreed to : continue with nutritional and behavioral strategies  ASSOCIATED CONDITIONS ADDRESSED TODAY  OSA on CPAP Assessment & Plan: On CPAP with reported good compliance. Continue PAP therapy. Losing 15% or more of body weight may improve AHI.      Obesity with current BMI of 40 Assessment & Plan: Patient has lost 16 % of his baseline body weight with lifestyle changes alone.  His physical activity levels have declined due to orthopedic problems and recent heart attack but has started to go to the gym.  He denies any chest pain.  He is very eager to having his surgery but considering recent heart attack this has been delayed.  He may also benefit from incretin  therapy to reduce the risk of Mace.  He is not interested at present time.  Patient has done a good job getting back on track with nutritional plan.  He has been tracking and journaling and staying under 1500 cal.  He is also getting about 100 g of protein per day.   Continue medically supervised weight loss plan.   Essential hypertension Assessment & Plan: Blood pressure at goal for age and risk category.  On amlodipine 5 mg a day, metoprolol, isosorbide without adverse effects.    Continue with weight loss therapy.  Monitor for symptoms of orthostasis while losing weight. Continue current regimen and home monitoring for a goal blood pressure of 120/80.    Atherosclerosis of native coronary artery of native heart with angina pectoris [facility]) Assessment & Plan: Currently asymptomatic.  On isosorbide, aspirin, atorvastatin, metoprolol.  Has started to exercise at the gym without any symptoms of angina.  May benefit from incretin therapy.  At present time he is not interested due to concerns about long-term reliance on medication.  Counseled on the benefits and side effects of Wegovy.      PHYSICAL EXAM:  Blood pressure 110/82, pulse (!) 57, temperature 97.9 F (36.6 C), height 5\' 10"  (1.778 m), weight 280 lb (127 kg), SpO2 95%. Body mass index is 40.18 kg/m.  General: He is overweight, cooperative, alert, well developed, and in no acute distress. PSYCH: Has normal mood, affect and thought process.   HEENT: EOMI, sclerae are anicteric. Lungs: Normal breathing effort, no conversational dyspnea. Extremities: No edema.  Neurologic: No gross sensory or motor deficits. No tremors or fasciculations noted.    DIAGNOSTIC DATA REVIEWED:  BMET    Component Value Date/Time   NA 139 03/19/2023 0437   NA 143 10/08/2022 1226   K 3.8 03/19/2023 0437   CL 108 03/19/2023 0437   CO2 27 03/19/2023 0437   GLUCOSE 93 03/19/2023 0437   BUN 21 03/19/2023 0437   BUN 22 10/08/2022 1226   CREATININE 0.90 03/19/2023 0437   CALCIUM 8.3 (L) 03/19/2023 0437   GFRNONAA >60 03/19/2023 0437   GFRAA >60 02/10/2020 0937   Lab Results  Component Value Date   HGBA1C 5.4 07/09/2022   Lab Results  Component Value Date   INSULIN 35.5 (H) 07/09/2022   Lab Results   Component Value Date   TSH 2.41 03/06/2023   CBC    Component Value Date/Time   WBC 9.8 03/19/2023 0437   RBC 4.87 03/19/2023 0437   HGB 13.8 03/19/2023 0437   HGB 14.5 11/23/2021 1024   HCT 42.1 03/19/2023 0437   HCT 43.9 11/23/2021 1024   PLT 217 03/19/2023 0437   PLT 239 11/23/2021 1024   MCV 86.4 03/19/2023 0437   MCV 87 11/23/2021 1024   MCH 28.3 03/19/2023 0437   MCHC 32.8 03/19/2023 0437   RDW 13.2 03/19/2023 0437   RDW 13.4 11/23/2021 1024   Iron Studies No results found for: "IRON", "TIBC", "FERRITIN", "IRONPCTSAT" Lipid Panel     Component Value Date/Time   CHOL 60 03/18/2023 0635   CHOL 82 (L) 07/09/2022 0913   TRIG 56 03/18/2023 0635   HDL 20 (L) 03/18/2023 0635   HDL 28 (L) 07/09/2022 0913   CHOLHDL 3.0 03/18/2023 0635   VLDL 11 03/18/2023 0635   LDLCALC 29 03/18/2023 0635   LDLCALC 33 07/09/2022 0913   Hepatic Function Panel     Component Value Date/Time   PROT 7.0 03/06/2023 1552   PROT 6.7  10/08/2022 1226   ALBUMIN 4.2 03/06/2023 1552   ALBUMIN 4.1 10/08/2022 1226   AST 14 03/06/2023 1552   ALT 16 03/06/2023 1552   ALKPHOS 82 03/06/2023 1552   BILITOT 0.4 03/06/2023 1552   BILITOT 0.3 10/08/2022 1226   BILIDIR <0.10 02/13/2021 0830      Component Value Date/Time   TSH 2.41 03/06/2023 1552   Nutritional Lab Results  Component Value Date   VD25OH 61.4 10/08/2022   VD25OH 18.2 (L) 07/09/2022     Return for Week of Sept 30th - 5 weeks - with Dr. Rikki Spearing.Marland Kitchen He was informed of the importance of frequent follow up visits to maximize his success with intensive lifestyle modifications for his multiple health conditions.   ATTESTASTION STATEMENTS:  Reviewed by clinician on day of visit: allergies, medications, problem list, medical history, surgical history, family history, social history, and previous encounter notes.     Worthy Rancher, MD

## 2023-04-21 NOTE — Assessment & Plan Note (Signed)
Currently asymptomatic.  On isosorbide, aspirin, atorvastatin, metoprolol.  Has started to exercise at the gym without any symptoms of angina.  May benefit from incretin therapy.  At present time he is not interested due to concerns about long-term reliance on medication.  Counseled on the benefits and side effects of Wegovy.

## 2023-04-21 NOTE — Assessment & Plan Note (Signed)
 Blood pressure at goal for age and risk category.  On amlodipine 5 mg a day, metoprolol, isosorbide without adverse effects.    Continue with weight loss therapy.  Monitor for symptoms of orthostasis while losing weight. Continue current regimen and home monitoring for a goal blood pressure of 120/80.

## 2023-04-21 NOTE — Assessment & Plan Note (Signed)
On CPAP with reported good compliance. Continue PAP therapy. Losing 15% or more of body weight may improve AHI.    

## 2023-04-22 ENCOUNTER — Encounter: Payer: Self-pay | Admitting: Orthopaedic Surgery

## 2023-04-22 ENCOUNTER — Ambulatory Visit: Payer: Medicare HMO | Admitting: Orthopaedic Surgery

## 2023-04-22 ENCOUNTER — Ambulatory Visit: Payer: Medicare HMO | Admitting: Adult Health

## 2023-04-22 VITALS — Ht 70.0 in | Wt 282.0 lb

## 2023-04-22 DIAGNOSIS — G8929 Other chronic pain: Secondary | ICD-10-CM | POA: Diagnosis not present

## 2023-04-22 DIAGNOSIS — M25562 Pain in left knee: Secondary | ICD-10-CM

## 2023-04-22 DIAGNOSIS — M1712 Unilateral primary osteoarthritis, left knee: Secondary | ICD-10-CM | POA: Diagnosis not present

## 2023-04-22 NOTE — Progress Notes (Signed)
The patient comes in for a weight and BMI calculation today.  He is on a weight loss journey.  He is in need of knee replacement surgery.  His left knee is the one that is mainly painful for him.  We agree that he needs the surgery as well.  His knees are not big knees but there are significant arthritic.  However his insurance stipulation is that he has have a BMI below 40.  Today his BMI is 40.46.  He does see a weight loss specialist in about 5 weeks so I would like to see him back in about 6 weeks for repeat weight and BMI calculation.  He understands that as well.

## 2023-04-27 ENCOUNTER — Encounter: Payer: Self-pay | Admitting: Internal Medicine

## 2023-04-27 DIAGNOSIS — L989 Disorder of the skin and subcutaneous tissue, unspecified: Secondary | ICD-10-CM

## 2023-05-02 ENCOUNTER — Ambulatory Visit: Payer: Medicare HMO | Admitting: Nurse Practitioner

## 2023-05-05 ENCOUNTER — Ambulatory Visit (INDEPENDENT_AMBULATORY_CARE_PROVIDER_SITE_OTHER): Payer: Medicare HMO | Admitting: Internal Medicine

## 2023-05-05 ENCOUNTER — Encounter: Payer: Self-pay | Admitting: Internal Medicine

## 2023-05-05 VITALS — BP 104/70 | HR 47 | Temp 97.2°F | Ht 70.0 in | Wt 282.0 lb

## 2023-05-05 DIAGNOSIS — D692 Other nonthrombocytopenic purpura: Secondary | ICD-10-CM | POA: Diagnosis not present
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
## 2023-05-05 NOTE — Assessment & Plan Note (Signed)
Distribution argues against worrisome pathology None outside of forearms--but no trauma to account for it On ASA but nothing new No fevers or systemic symptoms Awaiting derm evaluation---will try to get this pushed up

## 2023-05-06 ENCOUNTER — Encounter: Payer: Self-pay | Admitting: Internal Medicine

## 2023-05-07 ENCOUNTER — Encounter: Payer: Self-pay | Admitting: Dermatology

## 2023-05-07 ENCOUNTER — Ambulatory Visit: Payer: Medicare HMO | Admitting: Dermatology

## 2023-05-07 DIAGNOSIS — L578 Other skin changes due to chronic exposure to nonionizing radiation: Secondary | ICD-10-CM | POA: Diagnosis not present

## 2023-05-07 DIAGNOSIS — D692 Other nonthrombocytopenic purpura: Secondary | ICD-10-CM

## 2023-05-07 DIAGNOSIS — W908XXA Exposure to other nonionizing radiation, initial encounter: Secondary | ICD-10-CM

## 2023-05-07 NOTE — Patient Instructions (Signed)
Melanoma ABCDEs  Melanoma is the most dangerous type of skin cancer, and is the leading cause of death from skin disease.  You are more likely to develop melanoma if you: Have light-colored skin, light-colored eyes, or red or blond hair Spend a lot of time in the sun Tan regularly, either outdoors or in a tanning bed Have had blistering sunburns, especially during childhood Have a close family member who has had a melanoma Have atypical moles or large birthmarks  Early detection of melanoma is key since treatment is typically straightforward and cure rates are extremely high if we catch it early.   The first sign of melanoma is often a change in a mole or a new dark spot.  The ABCDE system is a way of remembering the signs of melanoma.  A for asymmetry:  The two halves do not match. B for border:  The edges of the growth are irregular. C for color:  A mixture of colors are present instead of an even brown color. D for diameter:  Melanomas are usually (but not always) greater than 6mm - the size of a pencil eraser. E for evolution:  The spot keeps changing in size, shape, and color.  Please check your skin once per month between visits. You can use a small mirror in front and a large mirror behind you to keep an eye on the back side or your body.   If you see any new or changing lesions before your next follow-up, please call to schedule a visit.  Please continue daily skin protection including broad spectrum sunscreen SPF 30+ to sun-exposed areas, reapplying every 2 hours as needed when you're outdoors.    Due to recent changes in healthcare laws, you may see results of your pathology and/or laboratory studies on MyChart before the doctors have had a chance to review them. We understand that in some cases there may be results that are confusing or concerning to you. Please understand that not all results are received at the same time and often the doctors may need to interpret multiple  results in order to provide you with the best plan of care or course of treatment. Therefore, we ask that you please give [place] 2 business days to thoroughly review all your results before contacting the office for clarification. Should we see a critical lab result, you will be contacted sooner.   If You Need Anything After Your Visit  If you have any questions or concerns for your doctor, please call our main line at [phone] and press option 4 to reach your doctor's medical assistant. If no one answers, please leave a voicemail as directed and we will return your call as soon as possible. Messages left after 4 pm will be answered the following business day.   You may also send [place] a message via MyChart. We typically respond to MyChart messages within 1-2 business days.  For prescription refills, please ask your pharmacy to contact our office. Our fax number is [phone].  If you have an urgent issue when the clinic is closed that cannot wait until the next business day, you can page your doctor at the number below.    Please note that while we do our best to be available for urgent issues outside of office hours, we are not available 24/7.   If you have an urgent issue and are unable to reach [place], you may choose to seek medical care at your doctor's office, retail clinic, urgent care  center, or emergency room.  If you have a medical emergency, please immediately call 911 or go to the emergency department.  Pager Numbers  - Dr. Gwen Pounds: [phone]  - Dr. Roseanne Reno: [phone]  - Dr. Katrinka Blazing: [phone]   In the event of inclement weather, please call our main line at [phone] for an update on the status of any delays or closures.  Dermatology Medication Tips: Please keep the boxes that topical medications come in in order to help keep track of the instructions about where and how to use these. Pharmacies typically print the medication instructions only on the boxes and not  directly on the medication tubes.   If your medication is too expensive, please contact our office at [phone] option 4 or send [place] a message through MyChart.   We are unable to tell what your co-pay for medications will be in advance as this is different depending on your insurance coverage. However, we may be able to find a substitute medication at lower cost or fill out paperwork to get insurance to cover a needed medication.   If a prior authorization is required to get your medication covered by your insurance company, please allow [place] 1-2 business days to complete this process.  Drug prices often vary depending on where the prescription is filled and some pharmacies may offer cheaper prices.  The website www.goodrx.com contains coupons for medications through different pharmacies. The prices here do not account for what the cost may be with help from insurance (it may be cheaper with your insurance), but the website can give you the price if you did not use any insurance.  - You can print the associated coupon and take it with your prescription to the pharmacy.  - You may also stop by our office during regular business hours and pick up a GoodRx coupon card.  - If you need your prescription sent electronically to a different pharmacy, notify our office through [facility] or by phone at [phone] option 4.     Si Usted Necesita Algo Despus de Su Visita  Tambin puede enviarnos un mensaje a travs de Clinical cytogeneticist. Por lo general respondemos a los mensajes de MyChart en el transcurso de 1 a 2 das hbiles.  Para renovar recetas, por favor pida a su farmacia que se ponga en contacto con nuestra oficina. Annie Sable de fax es [place] [phone].  Si tiene un asunto urgente cuando la clnica est cerrada y que no puede esperar hasta el siguiente da hbil, puede llamar/localizar a su doctor(a) al nmero que aparece a continuacin.   Por favor, tenga en cuenta que aunque hacemos  todo lo posible para estar disponibles para asuntos urgentes fuera del horario de [place], no estamos disponibles las 24 horas del da, los 7 [address]  [address] de la [place].   Si tiene un problema urgente y no puede comunicarse con nosotros, puede optar por buscar atencin mdica  en el consultorio de su doctor(a), en una clnica privada, en un centro de atencin urgente o en una sala de emergencias.  Si tiene Engineer, drilling, por favor llame inmediatamente al 911 o vaya a la sala de emergencias.  Nmeros de bper  - Dr. Gwen Pounds: [phone]  - Dra. Roseanne Reno: [phone]  - Dr. Katrinka Blazing: [phone]   En caso de inclemencias del tiempo, por favor llame a Lacy Duverney principal al [phone] para una actualizacin sobre el [place] de cualquier retraso o cierre.  Consejos para la medicacin en dermatologa: Por favor, guarde las cajas  en las que vienen los medicamentos de uso tpico para ayudarle a seguir las instrucciones sobre dnde y cmo usarlos. Las farmacias generalmente imprimen las instrucciones del medicamento slo en las cajas y no directamente en los tubos del [place].   Si su medicamento es muy caro, por favor, pngase en contacto con Rolm Gala llamando al [phone] y presione la opcin 4 o envenos un mensaje a travs de Clinical cytogeneticist.   No podemos decirle cul ser su copago por los medicamentos por adelantado ya que esto es diferente dependiendo de la cobertura de su seguro. Sin embargo, es posible que podamos encontrar un medicamento sustituto a Audiological scientist un formulario para que el seguro cubra el medicamento que se considera necesario.   Si se requiere una autorizacin previa para que su compaa de seguros [place] su medicamento, por favor permtanos de 1 a 2 das hbiles para completar [address]  Los precios de los medicamentos varan con frecuencia dependiendo del Environmental consultant de dnde se surte la receta y alguna farmacias pueden ofrecer precios ms baratos.  El  sitio web www.goodrx.com tiene cupones para medicamentos de Health and safety inspector. Los precios aqu no tienen en cuenta lo que podra costar con la ayuda del seguro (puede ser ms barato con su seguro), pero el sitio web puede darle el precio si no utiliz Tourist information centre manager.  - Puede imprimir el cupn correspondiente y llevarlo con su receta a la farmacia.  - Tambin puede pasar por nuestra oficina durante el horario de atencin regular y Education officer, museum una tarjeta de cupones de GoodRx.  - Si necesita que su receta se enve electrnicamente a una farmacia diferente, informe a nuestra oficina a travs de MyChart de  o por telfono llamando al [phone] y presione la opcin 4.

## 2023-05-07 NOTE — Progress Notes (Signed)
   New Patient Visit   Subjective  [name] is a 67 y.o. male who presents for the following: areas at arms. Raised with some puffiness and bumps, will bleed when scratched. Patient advises they seem to come back in the same places. Some are rough to the touch.  The patient has spots, moles and lesions to be evaluated, some may be new or changing and the patient may have concern these could be cancer.  No personal hx of skin cancer. Patient's brother does have hx of skin cancer but unsure what type.   Patient accompanied by wife who contributes to history.   The following portions of the chart were reviewed this encounter and updated as appropriate: medications, allergies, medical history  Review of Systems:  No other skin or systemic complaints except as noted in HPI or Assessment and Plan.  Objective  Well appearing patient in no apparent distress; mood and affect are within normal limits.   A focused examination was performed of the following areas: arms  Relevant exam findings are noted in the Assessment and Plan.    Assessment & Plan   Purpura - Chronic; persistent and recurrent.  Treatable, but not curable. - Violaceous macules and patches at arms - Benign - Related to minor trauma, sun damage and use of blood thinners (aspirin) - Call for worsening or other concerns  ACTINIC DAMAGE - chronic, secondary to cumulative UV radiation exposure/sun exposure over time - diffuse scaly erythematous macules with underlying dyspigmentation - Recommend daily broad spectrum sunscreen SPF 30+ to sun-exposed areas, reapply every 2 hours as needed.  - Recommend staying in the shade or wearing long sleeves, sun glasses (UVA+UVB protection) and wide brim hats (4-inch brim around the entire circumference of the hat). - Call for new or changing lesions.    Return if symptoms worsen or fail to improve.  Anise Salvo, RMA, am acting as scribe for Elie Goody, MD  .   Documentation: I have reviewed the above documentation for accuracy and completeness, and I agree with the above.  Elie Goody, MD

## 2023-05-16 ENCOUNTER — Encounter: Payer: Self-pay | Admitting: Internal Medicine

## 2023-05-19 ENCOUNTER — Other Ambulatory Visit: Payer: Self-pay | Admitting: *Deleted

## 2023-05-19 MED ORDER — AMLODIPINE BESYLATE 5 MG PO TABS
5.0000 mg | ORAL_TABLET | Freq: Every day | ORAL | 3 refills | Status: DC
  Filled 2023-11-05: qty 90, 90d supply, fill #0
  Filled 2023-11-06 – 2024-01-18 (×2): qty 90, 90d supply, fill #1

## 2023-05-29 ENCOUNTER — Ambulatory Visit (INDEPENDENT_AMBULATORY_CARE_PROVIDER_SITE_OTHER): Payer: Medicare HMO | Admitting: Internal Medicine

## 2023-05-29 ENCOUNTER — Encounter (INDEPENDENT_AMBULATORY_CARE_PROVIDER_SITE_OTHER): Payer: Self-pay | Admitting: Internal Medicine

## 2023-05-29 VITALS — BP 116/74 | HR 59 | Temp 97.9°F | Ht 70.0 in | Wt 281.0 lb

## 2023-05-29 DIAGNOSIS — I10 Essential (primary) hypertension: Secondary | ICD-10-CM | POA: Diagnosis not present

## 2023-05-29 DIAGNOSIS — E669 Obesity, unspecified: Secondary | ICD-10-CM | POA: Diagnosis not present

## 2023-05-29 DIAGNOSIS — E66813 Obesity, class 3: Secondary | ICD-10-CM

## 2023-05-29 DIAGNOSIS — G4733 Obstructive sleep apnea (adult) (pediatric): Secondary | ICD-10-CM | POA: Diagnosis not present

## 2023-05-29 DIAGNOSIS — Z6841 Body Mass Index (BMI) 40.0 and over, adult: Secondary | ICD-10-CM | POA: Diagnosis not present

## 2023-05-29 NOTE — Progress Notes (Signed)
Office: [phone]  /  Fax: [phone]  WEIGHT SUMMARY AND BIOMETRICS  Vitals Temp: 97.9 F (36.6 C) BP: 116/74 Pulse Rate: (!) 59 SpO2: 98 %   Anthropometric Measurements Height: 5\' 10"  (1.778 m) Weight: 281 lb (127.5 kg) BMI (Calculated): 40.32 Weight at Last Visit: 280 lb Weight Lost Since Last Visit: 0 lb Weight Gained Since Last Visit: 1 lb Starting Weight: 320 lb Total Weight Loss (lbs): 39 lb (17.7 kg) Peak Weight: 328 lb   Body Composition  Body Fat %: 41.1 % Fat Mass (lbs): 115.6 lbs Muscle Mass (lbs): 157.6 lbs Total Body Water (lbs): 117.6 lbs Visceral Fat Rating : 27    No data recorded Today's Visit #: 14  Starting Date: 07/09/22   HPI  Chief Complaint: OBESITY  [name] is here to discuss his progress with his obesity treatment plan. He is on the the Category 3 Plan and states he is following his eating plan approximately 85% % of the time. He states he is not exercising  Interval History:  Since last office visit he has gained 1 pounds. He reports good adherence to reduced calorie nutritional plan. He has been working on not skipping meals, increasing protein intake at every meal, eating more fruits, eating more vegetables, drinking more water, and making healthier choices  Orexigenic Control: Denies problems with appetite and hunger signals.  Denies problems with satiety and satiation.  Denies problems with eating patterns and portion control.  Denies abnormal cravings. Denies feeling deprived or restricted.   Barriers identified: orthopedic problems, medical conditions or chronic pain affecting mobility, medical comorbidities, and slow metabolism for age.   Pharmacotherapy for weight loss: He is currently taking no anti-obesity medication and patient has declined pharmacotherapy in past.    ASSESSMENT AND PLAN  TREATMENT PLAN FOR OBESITY:  Recommended Dietary Goals  Emad is currently in the action stage of change. As such, his  goal is to continue weight management plan. He has agreed to: continue current plan and incorporate meal replacement 1 meal per day to further reduce calories and convenience.  Behavioral Intervention  We discussed the following Behavioral Modification Strategies today: increasing lean protein intake, increasing water intake, continue to practice mindfulness when eating, and planning for success.  Additional resources provided today: Handout on popular protein drinks   Recommended Physical Activity Goals  Reice has been advised to work up to 150 minutes of moderate intensity aerobic activity a week and strengthening exercises 2-3 times per week for cardiovascular health, weight loss maintenance and preservation of muscle mass.   He has agreed to :  Patient is under the impression that orthopedic surgeon does not want him to exercise because of advanced osteoarthritis involving his knees.  We had previously discussed working on upper body and doing chair exercises.  He may also consider aqua aerobics.  Pharmacotherapy We discussed various medication options to help Fahd with his weight loss efforts and we both agreed to : has declined pharmacotherapy  ASSOCIATED CONDITIONS ADDRESSED TODAY  OSA on CPAP Assessment & Plan: On CPAP with reported good compliance. Continue PAP therapy. Losing 15% or more of body weight may improve AHI.      Obesity with current BMI of 40 Assessment & Plan: Patient has lost 49 pounds or 16 % of his baseline body weight with lifestyle changes alone.  His physical activity levels have declined due to orthopedic problems and recent heart attack. He is very eager to having his surgery but considering recent heart attack  this has been delayed.  He may also benefit from incretin therapy to reduce the risk of Mace.  He is not interested at present time.  Patient has done a good job getting back on track with nutritional plan.  He has reached a plateau or point of  equilibrium.  I will like to repeat his indirect calorimetry.  He is agreeable to incorporating a meal replacement once daily to further reduce calories but I want to make sure that his metabolism has not decreased due to chronic caloric restriction.     Essential hypertension Assessment & Plan: Blood pressure at goal for age and risk category.  On amlodipine 5 mg a day, metoprolol, isosorbide without adverse effects.    Continue with weight loss therapy.  Monitor for symptoms of orthostasis while losing weight. Continue current regimen and home monitoring for a goal blood pressure of 120/80.      PHYSICAL EXAM:  Blood pressure 116/74, pulse (!) 59, temperature 97.9 F (36.6 C), height 5\' 10"  (1.778 m), weight 281 lb (127.5 kg), SpO2 98%. Body mass index is 40.32 kg/m.  General: He is overweight, cooperative, alert, well developed, and in no acute distress. PSYCH: Has normal mood, affect and thought process.   HEENT: EOMI, sclerae are anicteric. Lungs: Normal breathing effort, no conversational dyspnea. Extremities: No edema.  Neurologic: No gross sensory or motor deficits. No tremors or fasciculations noted.    DIAGNOSTIC DATA REVIEWED:  BMET    Component Value Date/Time   NA 139 03/19/2023 0437   NA 143 10/08/2022 1226   K 3.8 03/19/2023 0437   CL 108 03/19/2023 0437   CO2 27 03/19/2023 0437   GLUCOSE 93 03/19/2023 0437   BUN 21 03/19/2023 0437   BUN 22 10/08/2022 1226   CREATININE 0.90 03/19/2023 0437   CALCIUM 8.3 (L) 03/19/2023 0437   GFRNONAA >60 03/19/2023 0437   GFRAA >60 02/10/2020 0937   Lab Results  Component Value Date   HGBA1C 5.4 07/09/2022   Lab Results  Component Value Date   INSULIN 35.5 (H) 07/09/2022   Lab Results  Component Value Date   TSH 2.41 03/06/2023   CBC    Component Value Date/Time   WBC 9.8 03/19/2023 0437   RBC 4.87 03/19/2023 0437   HGB 13.8 03/19/2023 0437   HGB 14.5 11/23/2021 1024   HCT 42.1 03/19/2023 0437   HCT  43.9 11/23/2021 1024   PLT 217 03/19/2023 0437   PLT 239 11/23/2021 1024   MCV 86.4 03/19/2023 0437   MCV 87 11/23/2021 1024   MCH 28.3 03/19/2023 0437   MCHC 32.8 03/19/2023 0437   RDW 13.2 03/19/2023 0437   RDW 13.4 11/23/2021 1024   Iron Studies No results found for: "IRON", "TIBC", "FERRITIN", "IRONPCTSAT" Lipid Panel     Component Value Date/Time   CHOL 60 03/18/2023 0635   CHOL 82 (L) 07/09/2022 0913   TRIG 56 03/18/2023 0635   HDL 20 (L) 03/18/2023 0635   HDL 28 (L) 07/09/2022 0913   CHOLHDL 3.0 03/18/2023 0635   VLDL 11 03/18/2023 0635   LDLCALC 29 03/18/2023 0635   LDLCALC 33 07/09/2022 0913   Hepatic Function Panel     Component Value Date/Time   PROT 7.0 03/06/2023 1552   PROT 6.7 10/08/2022 1226   ALBUMIN 4.2 03/06/2023 1552   ALBUMIN 4.1 10/08/2022 1226   AST 14 03/06/2023 1552   ALT 16 03/06/2023 1552   ALKPHOS 82 03/06/2023 1552   BILITOT 0.4 03/06/2023 1552  BILITOT 0.3 10/08/2022 1226   BILIDIR <0.10 02/13/2021 0830      Component Value Date/Time   TSH 2.41 03/06/2023 1552   Nutritional Lab Results  Component Value Date   VD25OH 61.4 10/08/2022   VD25OH 18.2 (L) 07/09/2022     Return in about 4 weeks (around 06/26/2023) for For Weight Mangement with Dr. Rikki Spearing - fasting , 40 minutes earlier for IC.Marland Kitchen He was informed of the importance of frequent follow up visits to maximize his success with intensive lifestyle modifications for his multiple health conditions.   ATTESTASTION STATEMENTS:  Reviewed by clinician on day of visit: allergies, medications, problem list, medical history, surgical history, family history, social history, and previous encounter notes.     Worthy Rancher, MD

## 2023-05-29 NOTE — Assessment & Plan Note (Signed)
On CPAP with reported good compliance. Continue PAP therapy. Losing 15% or more of body weight may improve AHI.    

## 2023-05-29 NOTE — Assessment & Plan Note (Signed)
 Blood pressure at goal for age and risk category.  On amlodipine 5 mg a day, metoprolol, isosorbide without adverse effects.    Continue with weight loss therapy.  Monitor for symptoms of orthostasis while losing weight. Continue current regimen and home monitoring for a goal blood pressure of 120/80.

## 2023-05-29 NOTE — Assessment & Plan Note (Signed)
Patient has lost 49 pounds or 16 % of his baseline body weight with lifestyle changes alone.  His physical activity levels have declined due to orthopedic problems and recent heart attack. He is very eager to having his surgery but considering recent heart attack this has been delayed.  He may also benefit from incretin therapy to reduce the risk of Mace.  He is not interested at present time.  Patient has done a good job getting back on track with nutritional plan.  He has reached a plateau or point of equilibrium.  I will like to repeat his indirect calorimetry.  He is agreeable to incorporating a meal replacement once daily to further reduce calories but I want to make sure that his metabolism has not decreased due to chronic caloric restriction.

## 2023-05-30 ENCOUNTER — Encounter: Payer: Self-pay | Admitting: Internal Medicine

## 2023-05-30 ENCOUNTER — Other Ambulatory Visit: Payer: Self-pay

## 2023-05-30 ENCOUNTER — Other Ambulatory Visit: Payer: Self-pay | Admitting: Internal Medicine

## 2023-05-30 ENCOUNTER — Other Ambulatory Visit: Payer: Self-pay | Admitting: Nurse Practitioner

## 2023-05-30 ENCOUNTER — Encounter: Payer: Self-pay | Admitting: Orthopaedic Surgery

## 2023-05-30 DIAGNOSIS — J0100 Acute maxillary sinusitis, unspecified: Secondary | ICD-10-CM

## 2023-05-30 DIAGNOSIS — G479 Sleep disorder, unspecified: Secondary | ICD-10-CM

## 2023-05-30 MED ORDER — FUROSEMIDE 20 MG PO TABS
ORAL_TABLET | ORAL | 1 refills | Status: DC
  Filled 2023-09-16: qty 90, 45d supply, fill #0

## 2023-06-04 ENCOUNTER — Ambulatory Visit: Payer: Medicare HMO | Admitting: Orthopaedic Surgery

## 2023-06-11 ENCOUNTER — Ambulatory Visit: Payer: Medicare HMO | Attending: Internal Medicine | Admitting: Internal Medicine

## 2023-06-11 ENCOUNTER — Encounter: Payer: Self-pay | Admitting: Internal Medicine

## 2023-06-11 VITALS — BP 130/82 | HR 89 | Ht 70.0 in | Wt 285.8 lb

## 2023-06-11 DIAGNOSIS — I25118 Atherosclerotic heart disease of native coronary artery with other forms of angina pectoris: Secondary | ICD-10-CM | POA: Diagnosis not present

## 2023-06-11 DIAGNOSIS — I10 Essential (primary) hypertension: Secondary | ICD-10-CM

## 2023-06-11 DIAGNOSIS — E785 Hyperlipidemia, unspecified: Secondary | ICD-10-CM

## 2023-06-11 MED ORDER — ISOSORBIDE MONONITRATE ER 60 MG PO TB24: 60.0000 mg | ORAL_TABLET | Freq: Two times a day (BID) | ORAL | 3 refills | Status: DC

## 2023-06-11 NOTE — Progress Notes (Unsigned)
Cardiology Office Note:  .   Date:  06/12/2023  ID:  [name], DOB 1956/01/25, MRN [id number] PCP: Karie Schwalbe, MD  [facility] Providers Cardiologist:  Yvonne Kendall, MD     History of Present Illness: .   Discussed the use of AI scribe software for clinical note transcription with the patient, who gave verbal consent to proceed.  [name] is a 67 y.o. male with history of coronary artery disease, hypertension, chronic HFpEF, hyperlipidemia, and obstructive sleep apnea, who presents for follow-up of coronary artery disease.  He was previously followed by Dr. Jens Som in [place] but has asked to transition his care to the [facility] office.  He was hospitalized in July with unstable angina.  Catheterization at that time showed interval occlusion of small D2 branch, which appeared subacute or chronic and was too small for PCI.  Due to resting bradycardia, metoprolol was decreased at that time.  At his follow-up visit in late July with [name], [place], he denied further chest pain and shortness of breath.  He was losing weight through lifestyle modifications.  He was planning to undergo knee replacement later this year.  Today, [name] endorses recurrent episodes of chest tightness and shortness of breath, particularly during physical labor. The discomfort is described as a sensation of tightness across the chest, which prompts the patient to rest. The symptoms do not resolve quickly, often persisting for 15-20 minutes, and tend to recur within 30 minutes of resuming activity. The patient has been managing these episodes with nitroglycerin when the pain also involves the left arm, which has been effective and well-tolerated, without any reported side effects such as dizziness, lightheadedness, or headaches. The patient admits to having ignored these symptoms in the past, but he has been occurring a few times a week recently. The patient is currently on a regimen of  amlodipine, aspirin, atorvastatin, isosorbide mononitrate, and metoprolol, which he is tolerating well. He denies any signs of bleeding, such as blood in the stool or black, tarry stools.     ROS: See HPI  Studies Reviewed: Marland Kitchen   ECG (03/25/2023): Normal sinus rhythm without significant abnormalities.  LHC (03/18/2023): Severe single-vessel coronary artery disease with chronic total occlusion of small D2 branch, which has progressed since last catheterization in 11/2021 (stenosis was approximately 50% at that time).  Otherwise, stable appearance of mild-moderate, nonobstructive CAD. Normal left ventricular filling pressure.  TTE (03/18/2023):  1. Left ventricular ejection fraction, by estimation, is 55 to 60%. The  left ventricle has normal function. Left ventricular endocardial border  not optimally defined to evaluate regional wall motion. There is moderate  left ventricular hypertrophy. Left  ventricular diastolic parameters are consistent with Grade I diastolic  dysfunction (impaired relaxation).   2. Right ventricular systolic function is normal. The right ventricular  size is normal. Tricuspid regurgitation signal is inadequate for assessing  PA pressure.   3. Right atrial size was mildly dilated.   4. The mitral valve is normal in structure. No evidence of mitral valve  regurgitation. No evidence of mitral stenosis.   5. The aortic valve is normal in structure. Aortic valve regurgitation is  not visualized. No aortic stenosis is present.   6. Aortic dilatation noted. There is mild dilatation of the ascending  aorta, measuring 40 mm.   Risk Assessment/Calculations:             Physical Exam:   VS:  BP 130/82 (BP Location: Left Arm, Patient Position: Sitting, Cuff  Size: Normal)   Pulse 89   Ht 5\' 10"  (1.778 m)   Wt 285 lb 12.8 oz (129.6 kg)   SpO2 96%   BMI 41.01 kg/m    Wt Readings from Last 3 Encounters:  06/11/23 285 lb 12.8 oz (129.6 kg)  05/29/23 281 lb (127.5 kg)   05/05/23 282 lb (127.9 kg)    General:  NAD. Neck: No JVD or HJR. Lungs: Clear to auscultation bilaterally without wheezes or crackles. Heart: Regular rate and rhythm without murmurs, rubs, or gallops. Abdomen: Soft, nontender, nondistended. Extremities: No lower extremity edema.  ASSESSMENT AND PLAN: .    Coronary artery disease with stable angina: [name] continues to have reproducible chest pain with activity.  He has used nitroglycerin on occasion with good relief.  He is tolerating his current antianginal regimen well.  We have agreed to increase isosorbide mononitrate to 60 mg twice daily.  I will defer increasing metoprolol, as significant resting bradycardia was noted on the higher doses.  If angina does not improve, we could consider further escalation of amlodipine or addition of ranolazine as catheterization this summer did not show any interventional targets.  Continue aspirin and atorvastatin for secondary prevention.  Hypertension: Blood pressure borderline elevated today.  As above, we will increase isosorbide mononitrate to 60 mg twice daily as well as continue current doses of amlodipine and metoprolol succinate.  Hyperlipidemia: Lipids very well-controlled on last check in July with LDL of 29 and triglycerides of 56.  Continue atorvastatin 40 mg daily.  Morbid obesity: BMI > 40.  Encourage weight loss through diet and remaining active.    Cardiac Rehabilitation Eligibility Assessment  The patient is ready to start cardiac rehabilitation from a cardiac standpoint.    Dispo: Return to clinic in 3 months.  Signed, Yvonne Kendall, MD

## 2023-06-11 NOTE — Patient Instructions (Signed)
Medication Instructions:  Your physician recommends the following medication changes.  INCREASE: Imdur to 60 mg by mouth twice a day   *If you need a refill on your cardiac medications before your next appointment, please call your pharmacy*   Lab Work: No labs ordered today    Testing/Procedures: No test ordered today    Follow-Up: At [facility], you and your health needs are our priority.  As part of our continuing mission to provide you with exceptional heart care, we have created designated Provider Care Teams.  These Care Teams include your primary Cardiologist (physician) and Advanced Practice Providers (APPs -  Physician Assistants and Nurse Practitioners) who all work together to provide you with the care you need, when you need it.  We recommend signing up for the patient portal called "MyChart".  Sign up information is provided on this After Visit Summary.  MyChart is used to connect with patients for Virtual Visits (Telemedicine).  Patients are able to view lab/test results, encounter notes, upcoming appointments, etc.  Non-urgent messages can be sent to your provider as well.   To learn more about what you can do with MyChart, go to [email or web address].    Your next appointment:   3 month(s)  Provider:   Yvonne Kendall, MD

## 2023-06-12 ENCOUNTER — Encounter: Payer: Self-pay | Admitting: Internal Medicine

## 2023-06-23 ENCOUNTER — Telehealth: Payer: Self-pay | Admitting: Internal Medicine

## 2023-06-23 NOTE — Telephone Encounter (Signed)
Pt needs a cpap mask

## 2023-06-24 DIAGNOSIS — G4733 Obstructive sleep apnea (adult) (pediatric): Secondary | ICD-10-CM | POA: Diagnosis not present

## 2023-06-25 ENCOUNTER — Encounter: Payer: Self-pay | Admitting: Internal Medicine

## 2023-06-25 DIAGNOSIS — G4733 Obstructive sleep apnea (adult) (pediatric): Secondary | ICD-10-CM

## 2023-06-25 NOTE — Telephone Encounter (Signed)
Patient last seen 06/24/2022. Has follow up appt on 12/27. Ok to send order for new CPAP mask?

## 2023-06-26 ENCOUNTER — Encounter (INDEPENDENT_AMBULATORY_CARE_PROVIDER_SITE_OTHER): Payer: Self-pay | Admitting: Internal Medicine

## 2023-06-27 NOTE — Telephone Encounter (Signed)
Order for CPAP supplies placed 06/26/23.

## 2023-06-30 ENCOUNTER — Ambulatory Visit (INDEPENDENT_AMBULATORY_CARE_PROVIDER_SITE_OTHER): Payer: Medicare HMO | Admitting: Internal Medicine

## 2023-06-30 ENCOUNTER — Encounter (INDEPENDENT_AMBULATORY_CARE_PROVIDER_SITE_OTHER): Payer: Self-pay | Admitting: Internal Medicine

## 2023-06-30 VITALS — BP 107/71 | HR 57 | Temp 97.8°F | Ht 70.0 in | Wt 279.0 lb

## 2023-06-30 DIAGNOSIS — I25118 Atherosclerotic heart disease of native coronary artery with other forms of angina pectoris: Secondary | ICD-10-CM

## 2023-06-30 DIAGNOSIS — E88819 Insulin resistance, unspecified: Secondary | ICD-10-CM

## 2023-06-30 DIAGNOSIS — Z6841 Body Mass Index (BMI) 40.0 and over, adult: Secondary | ICD-10-CM | POA: Diagnosis not present

## 2023-06-30 DIAGNOSIS — E66813 Obesity, class 3: Secondary | ICD-10-CM

## 2023-06-30 DIAGNOSIS — G4733 Obstructive sleep apnea (adult) (pediatric): Secondary | ICD-10-CM | POA: Diagnosis not present

## 2023-06-30 DIAGNOSIS — R948 Abnormal results of function studies of other organs and systems: Secondary | ICD-10-CM | POA: Insufficient documentation

## 2023-06-30 DIAGNOSIS — R0602 Shortness of breath: Secondary | ICD-10-CM

## 2023-06-30 NOTE — Assessment & Plan Note (Addendum)
Currently asymptomatic.  On isosorbide, aspirin, atorvastatin, metoprolol.  Has started to exercise at the gym without any symptoms of angina.  May benefit from incretin therapy.  At present time he is not interested due to concerns about long-term reliance on medication.

## 2023-06-30 NOTE — Assessment & Plan Note (Signed)
When considering his peak weight this patient has lost approximately 103 pounds close to 27% of his body weight .  This has resulted in adaptive thermogenic status and therefore his basal metabolic rate has gone down.  Unfortunately because of orthopedic problems and recent heart attack he is limited as a relates to physical activity.  Patient is also on several medications that are weight promoting.  I would like for him to work on increasing his protein intake which may help set some of the changes in his basal metabolic rate but without any increase in physical activity this will be hard to overcome.  We may have to increase his calories as further calorie restriction may worsen this problem.  We will see how he does over the next 3 weeks and make adjustments to his nutritional intake at the next office visit.

## 2023-06-30 NOTE — Assessment & Plan Note (Signed)
We are checking hemoglobin A1c and fasting insulin levels today.  Continue diet low in simple and added sugars.

## 2023-06-30 NOTE — Progress Notes (Unsigned)
Office: [phone]  /  Fax: [phone]  WEIGHT SUMMARY AND BIOMETRICS  Vitals Temp: 97.8 F (36.6 C) BP: 107/71 Pulse Rate: (!) 57 SpO2: 95 %   Anthropometric Measurements Height: 5\' 10"  (1.778 m) Weight: 279 lb (126.6 kg) BMI (Calculated): 40.03 Weight at Last Visit: 281 lb Weight Lost Since Last Visit: 2 lb Weight Gained Since Last Visit: 0 lb Starting Weight: 320 lb Total Weight Loss (lbs): 41 lb (18.6 kg) Peak Weight: 382 lb   Body Composition  Body Fat %: 39.3 % Fat Mass (lbs): 109.8 lbs Muscle Mass (lbs): 161 lbs Total Body Water (lbs): 116 lbs Visceral Fat Rating : 26    RMR: 1656  Today's Visit #: 15  Starting Date: 07/09/22   HPI  Chief Complaint: OBESITY  [name] is here to discuss his progress with his obesity treatment plan. He is on the the Category 3 Plan and states he is following his eating plan approximately 90 % of the time. He states he is working a lot around [employer].  Interval History:   Discussed the use of AI scribe software for clinical note transcription with the patient, who gave verbal consent to proceed.  History of Present Illness    Patient presents today for weight management.  He has been using meal replacements for one of his meals, specifically protein shakes in the morning, and has reduced his portion sizes for lunch and dinner. He reports good appetite control and an increased intake of protein. Despite his efforts, his metabolism has decreased, likely due to significant weight loss, which has resulted in a more gradual weight loss recently. Physical activity is limited due to knee issues and a recent heart attack.  The patient has been engaging in housework and projects, which involve standing, squatting, and walking, as a form of physical activity. However, he still experiences shortness of breath and knee swelling, which limits his activity level. He also reports consistent use of his CPAP machine, without which he  experiences irritability and difficulty sleeping.  He has been successful in reducing simple added sugars in his diet. His body composition has improved, with a decrease in body fat percentage and an increase in muscle mass. Despite a slowing in weight loss, the patient is committed to maintaining his current dietary and physical activity regimen. He is not interested in weight loss medications at this time.     Orexigenic Control: Denies problems with appetite and hunger signals.  Denies problems with satiety and satiation.  Denies problems with eating patterns and portion control.  Denies abnormal cravings. Denies feeling deprived or restricted.   Barriers identified: none, low volume of physical activity at present , orthopedic problems, medical conditions or chronic pain affecting mobility, medical comorbidities, presence of obesogenic drugs, slow metabolism for age, and sleep apnea.   Pharmacotherapy for weight loss: He is currently taking no anti-obesity medication and patient has declined pharmacotherapy in past.    ASSESSMENT AND PLAN  TREATMENT PLAN FOR OBESITY:  Recommended Dietary Goals  [name] is currently in the action stage of change. As such, his goal is to continue weight management plan. He has agreed to: continue current plan  Behavioral Intervention  We discussed the following Behavioral Modification Strategies today: continue to work on maintaining a reduced calorie state, getting the recommended amount of protein, incorporating whole foods, making healthy choices, staying well hydrated and practicing mindfulness when eating..  Additional resources provided today: None  Recommended Physical Activity Goals  [name] has been  advised to work up to 150 minutes of moderate intensity aerobic activity a week and strengthening exercises 2-3 times per week for cardiovascular health, weight loss maintenance and preservation of muscle mass.   He has agreed to :  Think about  enjoyable ways to increase daily physical activity and overcoming barriers to exercise and Increase physical activity in their day and reduce sedentary time (increase NEAT).  Pharmacotherapy We discussed various medication options to help [name] with his weight loss efforts and we both agreed to : continue with nutritional and behavioral strategies  ASSOCIATED CONDITIONS ADDRESSED TODAY  Abnormal metabolism Assessment & Plan: Patient has a slower than predicted metabolism. IC 1656 vs. calculated 2343.  This is likely the cause of his weight loss plateauing.  We reviewed measures to improve metabolism including not skipping meals, progressive strengthening exercises, increasing protein intake at every meal and maintaining adequate hydration and sleep.     OSA on CPAP Assessment & Plan: On CPAP with reported good compliance. Continue PAP therapy. Losing 15% or more of body weight may improve AHI.      Coronary artery disease of native artery of native heart with stable angina pectoris [name]) Assessment & Plan: Currently asymptomatic.  On isosorbide, aspirin, atorvastatin, metoprolol.  Has started to exercise at the gym without any symptoms of angina.  May benefit from incretin therapy.  At present time he is not interested due to concerns about long-term reliance on medication.      Class 3 severe obesity with serious comorbidity and body mass index (BMI) of 45.0 to 49.9 in adult, unspecified obesity type [name]) Assessment & Plan: Patient has lost approximately 49 pounds or 16 % of his baseline body weight with lifestyle changes alone.  His physical activity levels have declined due to orthopedic problems and recent heart attack. He is very eager to having his surgery but considering recent heart attack this has been delayed.  He may also benefit from incretin therapy to reduce the risk of Mace.  He is not interested at present time.   Because of the slowing of his weight loss we repeated  indirect calorimetry unfortunately this shows a reduction in basal metabolic rate now at 1656.  He is on several obesogenic drugs but I feel this is likely related to metabolic adaptations associated with weight loss and chronic calorie restriction.  This will be hard to overcome without increasing physical activity which at present time he feels very limited.     Insulin resistance Assessment & Plan: We are checking hemoglobin A1c and fasting insulin levels today.  Continue diet low in simple and added sugars.  Orders: -     Hemoglobin A1c -     Insulin, random  SOB (shortness of breath) on exertion    PHYSICAL EXAM:  Blood pressure 107/71, pulse (!) 57, temperature 97.8 F (36.6 C), height 5\' 10"  (1.778 m), weight 279 lb (126.6 kg), SpO2 95%. Body mass index is 40.03 kg/m.  General: He is overweight, cooperative, alert, well developed, and in no acute distress. PSYCH: Has normal mood, affect and thought process.   HEENT: EOMI, sclerae are anicteric. Lungs: Normal breathing effort, no conversational dyspnea. Extremities: No edema.  Neurologic: No gross sensory or motor deficits. No tremors or fasciculations noted.    DIAGNOSTIC DATA REVIEWED:  BMET    Component Value Date/Time   NA 139 03/19/2023 0437   NA 143 10/08/2022 1226   K 3.8 03/19/2023 0437   CL 108 03/19/2023 0437  CO2 27 03/19/2023 0437   GLUCOSE 93 03/19/2023 0437   BUN 21 03/19/2023 0437   BUN 22 10/08/2022 1226   CREATININE 0.90 03/19/2023 0437   CALCIUM 8.3 (L) 03/19/2023 0437   GFRNONAA >60 03/19/2023 0437   GFRAA >60 02/10/2020 0937   Lab Results  Component Value Date   HGBA1C 5.4 07/09/2022   Lab Results  Component Value Date   INSULIN 35.5 (H) 07/09/2022   Lab Results  Component Value Date   TSH 2.41 03/06/2023   CBC    Component Value Date/Time   WBC 9.8 03/19/2023 0437   RBC 4.87 03/19/2023 0437   HGB 13.8 03/19/2023 0437   HGB 14.5 11/23/2021 1024   HCT 42.1 03/19/2023 0437    HCT 43.9 11/23/2021 1024   PLT 217 03/19/2023 0437   PLT 239 11/23/2021 1024   MCV 86.4 03/19/2023 0437   MCV 87 11/23/2021 1024   MCH 28.3 03/19/2023 0437   MCHC 32.8 03/19/2023 0437   RDW 13.2 03/19/2023 0437   RDW 13.4 11/23/2021 1024   Iron Studies No results found for: "IRON", "TIBC", "FERRITIN", "IRONPCTSAT" Lipid Panel     Component Value Date/Time   CHOL 60 03/18/2023 0635   CHOL 82 (L) 07/09/2022 0913   TRIG 56 03/18/2023 0635   HDL 20 (L) 03/18/2023 0635   HDL 28 (L) 07/09/2022 0913   CHOLHDL 3.0 03/18/2023 0635   VLDL 11 03/18/2023 0635   LDLCALC 29 03/18/2023 0635   LDLCALC 33 07/09/2022 0913   Hepatic Function Panel     Component Value Date/Time   PROT 7.0 03/06/2023 1552   PROT 6.7 10/08/2022 1226   ALBUMIN 4.2 03/06/2023 1552   ALBUMIN 4.1 10/08/2022 1226   AST 14 03/06/2023 1552   ALT 16 03/06/2023 1552   ALKPHOS 82 03/06/2023 1552   BILITOT 0.4 03/06/2023 1552   BILITOT 0.3 10/08/2022 1226   BILIDIR <0.10 02/13/2021 0830      Component Value Date/Time   TSH 2.41 03/06/2023 1552   Nutritional Lab Results  Component Value Date   VD25OH 61.4 10/08/2022   VD25OH 18.2 (L) 07/09/2022     Return in about 4 weeks (around 07/28/2023) for For Weight Mangement with Dr. Rikki Spearing.Marland Kitchen He was informed of the importance of frequent follow up visits to maximize his success with intensive lifestyle modifications for his multiple health conditions.   ATTESTASTION STATEMENTS:  Reviewed by clinician on day of visit: allergies, medications, problem list, medical history, surgical history, family history, social history, and previous encounter notes.     Worthy Rancher, MD

## 2023-06-30 NOTE — Assessment & Plan Note (Signed)
On CPAP with reported good compliance. Continue PAP therapy.

## 2023-06-30 NOTE — Assessment & Plan Note (Signed)
Patient has a slower than predicted metabolism. IC 1656 vs. calculated 2343.  This is likely the cause of his weight loss plateauing.  We reviewed measures to improve metabolism including not skipping meals, progressive strengthening exercises, increasing protein intake at every meal and maintaining adequate hydration and sleep.

## 2023-07-01 LAB — INSULIN, RANDOM: INSULIN: 17.5 u[IU]/mL (ref 2.6–24.9)

## 2023-07-01 LAB — HEMOGLOBIN A1C
Est. average glucose Bld gHb Est-mCnc: 105 mg/dL
Hgb A1c MFr Bld: 5.3 % (ref 4.8–5.6)

## 2023-07-02 ENCOUNTER — Encounter: Payer: Self-pay | Admitting: Internal Medicine

## 2023-07-02 ENCOUNTER — Encounter: Payer: Self-pay | Admitting: Orthopaedic Surgery

## 2023-07-25 DIAGNOSIS — G4733 Obstructive sleep apnea (adult) (pediatric): Secondary | ICD-10-CM | POA: Diagnosis not present

## 2023-07-31 ENCOUNTER — Encounter (INDEPENDENT_AMBULATORY_CARE_PROVIDER_SITE_OTHER): Payer: Self-pay | Admitting: Internal Medicine

## 2023-07-31 ENCOUNTER — Ambulatory Visit (INDEPENDENT_AMBULATORY_CARE_PROVIDER_SITE_OTHER): Payer: Medicare HMO | Admitting: Internal Medicine

## 2023-07-31 VITALS — BP 110/72 | HR 55 | Temp 97.7°F | Ht 70.0 in | Wt 276.0 lb

## 2023-07-31 DIAGNOSIS — I5032 Chronic diastolic (congestive) heart failure: Secondary | ICD-10-CM

## 2023-07-31 DIAGNOSIS — I10 Essential (primary) hypertension: Secondary | ICD-10-CM

## 2023-07-31 DIAGNOSIS — M1711 Unilateral primary osteoarthritis, right knee: Secondary | ICD-10-CM

## 2023-07-31 DIAGNOSIS — G4733 Obstructive sleep apnea (adult) (pediatric): Secondary | ICD-10-CM

## 2023-07-31 DIAGNOSIS — E88819 Insulin resistance, unspecified: Secondary | ICD-10-CM

## 2023-07-31 DIAGNOSIS — I110 Hypertensive heart disease with heart failure: Secondary | ICD-10-CM | POA: Diagnosis not present

## 2023-07-31 DIAGNOSIS — Z6841 Body Mass Index (BMI) 40.0 and over, adult: Secondary | ICD-10-CM

## 2023-07-31 DIAGNOSIS — Z87891 Personal history of nicotine dependence: Secondary | ICD-10-CM

## 2023-07-31 DIAGNOSIS — E66813 Obesity, class 3: Secondary | ICD-10-CM

## 2023-07-31 NOTE — Progress Notes (Signed)
Office: [phone]  /  Fax: [phone]  Weight Summary And Biometrics  Vitals Temp: 97.7 F (36.5 C) BP: 110/72 Pulse Rate: (!) 55 SpO2: 94 %   Anthropometric Measurements Height: 5\' 10"  (1.778 m) Weight: 276 lb (125.2 kg) BMI (Calculated): 39.6 Weight at Last Visit: 279 lb Weight Lost Since Last Visit: 3 lb Weight Gained Since Last Visit: 0 lb Starting Weight: 320 lb Total Weight Loss (lbs): 44 lb (20 kg) Peak Weight: 382 lb   Body Composition  Body Fat %: 39.8 % Fat Mass (lbs): 110 lbs Muscle Mass (lbs): 158.4 lbs Total Body Water (lbs): 115 lbs Visceral Fat Rating : 26    RMR: 1656  Today's Visit #: 16  Starting Date: 07/10/23   Subjective   Chief Complaint: Obesity  [name] is here to discuss his progress with his obesity treatment plan. He is on the the Category 3 Plan and states he is following his eating plan approximately 85 % of the time. He states he is exercising 10-20 minutes 3 times per week.  Interval History:   Since last office visit he has lost 3 pounds. He reports good adherence to reduced calorie nutritional plan. He has been working on reading food labels, not skipping meals, increasing protein intake at every meal, drinking more water, making healthier choices, reducing portion sizes, and incorporating more whole foods . He has incorporated a meal replacement once a day.  Orexigenic Control:  Denies problems with appetite and hunger signals.  Denies problems with satiety and satiation.  Denies problems with eating patterns and portion control.  Denies abnormal cravings. Denies feeling deprived or restricted.   Barriers identified: none.   Pharmacotherapy for weight loss: He is currently taking no anti-obesity medication.   Assessment and Plan   Treatment Plan For Obesity:  Recommended Dietary Goals  Tuf is currently in the action stage of change. As such, his goal is to continue weight management plan. He has agreed  to: continue current plan  Behavioral Intervention  We discussed the following Behavioral Modification Strategies today: continue to work on maintaining a reduced calorie state, getting the recommended amount of protein, incorporating whole foods, making healthy choices, staying well hydrated and practicing mindfulness when eating..  Additional resources provided today: None  Recommended Physical Activity Goals  [name] has been advised to work up to 150 minutes of moderate intensity aerobic activity a week and strengthening exercises 2-3 times per week for cardiovascular health, weight loss maintenance and preservation of muscle mass.   He has agreed to :  Think about enjoyable ways to increase daily physical activity and overcoming barriers to exercise and Increase physical activity in their day and reduce sedentary time (increase NEAT).  Pharmacotherapy  We discussed various medication options to help Imer with his weight loss efforts and we both agreed to : continue with nutritional and behavioral strategies  Associated Conditions Addressed Today  OSA on CPAP  Essential hypertension  Chronic heart failure with preserved ejection fraction (HCC)  Insulin resistance  Class 3 severe obesity with serious comorbidity and body mass index (BMI) of 45.0 to 49.9 in adult, unspecified obesity type (HCC)  Unilateral primary osteoarthritis, right knee    Assessment and Plan    Obesity   He has lost approximately 49 pounds, accounting for 16% of his total body weight, with a recent loss of 3 pounds. His BMI is currently 39. Indirect calorimetry indicates a decrease in basal metabolic rate, likely due to chronic calorie restriction. He  reports good appetite control and increased protein intake, with no significant changes in physical activity due to knee pain. The potential use of Wegovy for weight loss was discussed, highlighting its benefits in reducing cardiovascular risk. We will  continue his current dietary regimen with increased protein intake, monitor weight and BMI, and consider Wegovy if weight loss plateaus.  Knee Osteoarthritis   He experiences significant knee pain, particularly in the left knee, which affects sleep and physical activity. Plans for knee replacement surgery are in place, with the right knee also requiring replacement eventually. The need for cardiac clearance before surgery was discussed due to potential stress on the heart. We will follow up with the orthopedic surgeon for knee replacement surgery and ensure cardiac clearance before surgery.  Hypertension Blood pressure is well-controlled on current regimen.  He will continue on amlodipine, isosorbide and metoprolol.  Monitor for orthostasis while losing weight.  Coronary artery disease angina heart failure with preserved ejection fraction He had two episodes of angina two weeks ago, relieved by nitroglycerin, with no associated symptoms of diaphoresis or cold, clammy skin. Left arm pain was noted as a signal for angina. His blood pressure is well-controlled, with no signs of fluid retention. The importance of cardiac clearance before knee surgery was discussed due to stress on the heart. We will continue current cardiac medications (amlodipine, isosorbide, aspirin, metoprolol), monitor for further episodes of chest pain, and follow up with the cardiologist as needed.  Patient would also benefit from GLP-1 therapy which she declines.  Sleep Apnea   He uses CPAP every night with consistent use reported. We will continue using CPAP nightly.  General Health Maintenance   He is mindful of sodium intake due to a family history of heart disease. Blood work from November shows an A1c at 5.3 and improved insulin levels, indicating better metabolic control. The importance of maintaining sodium intake below 2000 mg/day and the benefits of whole foods and cooking from scratch to control sodium intake were  discussed. We will maintain sodium intake below 2000 mg/day, continue monitoring A1c and insulin levels, and encourage whole foods and cooking from scratch to control sodium intake.  Follow-up   We will schedule follow-up appointments with the cardiologist and orthopedic surgeon and monitor for any new symptoms or changes in condition.        Objective   Physical Exam:  Blood pressure 110/72, pulse (!) 55, temperature 97.7 F (36.5 C), height 5\' 10"  (1.778 m), weight 276 lb (125.2 kg), SpO2 94%. Body mass index is 39.6 kg/m.  General: He is overweight, cooperative, alert, well developed, and in no acute distress. PSYCH: Has normal mood, affect and thought process.   HEENT: EOMI, sclerae are anicteric. Lungs: Normal breathing effort, no conversational dyspnea. Extremities: No edema.  Neurologic: No gross sensory or motor deficits. No tremors or fasciculations noted.    Diagnostic Data Reviewed:  BMET    Component Value Date/Time   NA 139 03/19/2023 0437   NA 143 10/08/2022 1226   K 3.8 03/19/2023 0437   CL 108 03/19/2023 0437   CO2 27 03/19/2023 0437   GLUCOSE 93 03/19/2023 0437   BUN 21 03/19/2023 0437   BUN 22 10/08/2022 1226   CREATININE 0.90 03/19/2023 0437   CALCIUM 8.3 (L) 03/19/2023 0437   GFRNONAA >60 03/19/2023 0437   GFRAA >60 02/10/2020 0937   Lab Results  Component Value Date   HGBA1C 5.3 06/30/2023   HGBA1C 5.4 07/09/2022   Lab Results  Component  Value Date   INSULIN 17.5 06/30/2023   INSULIN 35.5 (H) 07/09/2022   Lab Results  Component Value Date   TSH 2.41 03/06/2023   CBC    Component Value Date/Time   WBC 9.8 03/19/2023 0437   RBC 4.87 03/19/2023 0437   HGB 13.8 03/19/2023 0437   HGB 14.5 11/23/2021 1024   HCT 42.1 03/19/2023 0437   HCT 43.9 11/23/2021 1024   PLT 217 03/19/2023 0437   PLT 239 11/23/2021 1024   MCV 86.4 03/19/2023 0437   MCV 87 11/23/2021 1024   MCH 28.3 03/19/2023 0437   MCHC 32.8 03/19/2023 0437   RDW 13.2  03/19/2023 0437   RDW 13.4 11/23/2021 1024   Iron Studies No results found for: "IRON", "TIBC", "FERRITIN", "IRONPCTSAT" Lipid Panel     Component Value Date/Time   CHOL 60 03/18/2023 0635   CHOL 82 (L) 07/09/2022 0913   TRIG 56 03/18/2023 0635   HDL 20 (L) 03/18/2023 0635   HDL 28 (L) 07/09/2022 0913   CHOLHDL 3.0 03/18/2023 0635   VLDL 11 03/18/2023 0635   LDLCALC 29 03/18/2023 0635   LDLCALC 33 07/09/2022 0913   Hepatic Function Panel     Component Value Date/Time   PROT 7.0 03/06/2023 1552   PROT 6.7 10/08/2022 1226   ALBUMIN 4.2 03/06/2023 1552   ALBUMIN 4.1 10/08/2022 1226   AST 14 03/06/2023 1552   ALT 16 03/06/2023 1552   ALKPHOS 82 03/06/2023 1552   BILITOT 0.4 03/06/2023 1552   BILITOT 0.3 10/08/2022 1226   BILIDIR <0.10 02/13/2021 0830      Component Value Date/Time   TSH 2.41 03/06/2023 1552   Nutritional Lab Results  Component Value Date   VD25OH 61.4 10/08/2022   VD25OH 18.2 (L) 07/09/2022    Follow-Up   No follow-ups on file.Marland Kitchen He was informed of the importance of frequent follow up visits to maximize his success with intensive lifestyle modifications for his multiple health conditions.  Attestation Statement   Reviewed by clinician on day of visit: allergies, medications, problem list, medical history, surgical history, family history, social history, and previous encounter notes.     Worthy Rancher, MD

## 2023-08-04 ENCOUNTER — Encounter: Payer: Self-pay | Admitting: Orthopaedic Surgery

## 2023-08-08 DIAGNOSIS — H16212 Exposure keratoconjunctivitis, left eye: Secondary | ICD-10-CM | POA: Diagnosis not present

## 2023-08-12 ENCOUNTER — Encounter: Payer: Self-pay | Admitting: Internal Medicine

## 2023-08-12 ENCOUNTER — Other Ambulatory Visit: Payer: Self-pay | Admitting: Emergency Medicine

## 2023-08-12 MED ORDER — ATORVASTATIN CALCIUM 40 MG PO TABS
40.0000 mg | ORAL_TABLET | Freq: Every day | ORAL | 3 refills | Status: DC
  Filled 2023-11-05: qty 90, 90d supply, fill #0
  Filled 2023-11-06 – 2024-01-31 (×2): qty 90, 90d supply, fill #1
  Filled 2024-05-03: qty 90, 90d supply, fill #2

## 2023-08-22 ENCOUNTER — Ambulatory Visit: Payer: Medicare HMO | Admitting: Internal Medicine

## 2023-08-22 ENCOUNTER — Encounter: Payer: Self-pay | Admitting: Internal Medicine

## 2023-08-22 VITALS — BP 128/70 | HR 61 | Temp 97.9°F | Ht 70.0 in | Wt 285.2 lb

## 2023-08-22 DIAGNOSIS — G4733 Obstructive sleep apnea (adult) (pediatric): Secondary | ICD-10-CM | POA: Diagnosis not present

## 2023-08-22 NOTE — Patient Instructions (Addendum)
It was a pleasure to see you today!  Please schedule follow up scheduled with myself in 1 year.  If my schedule is not open yet, we will contact you with a reminder closer to that time. Please call [phone] if you haven't heard from [place] a month before, and always call us sooner if issues or concerns arise. You can also send [place] a message through MyChart, but but aware that this is not to be used for urgent issues and it may take up to 5-7 days to receive a reply. Please be aware that you will likely be able to view your results before I have a chance to respond to them. Please give [place] 5 business days to respond to any non-urgent results.   I am delighted to hear that you are doing well.congratulations on your lifestyle changes Continue CPAP at current settings. You are doing very well.  Call me sooner if questions or issues.
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
## 2023-08-24 DIAGNOSIS — G4733 Obstructive sleep apnea (adult) (pediatric): Secondary | ICD-10-CM | POA: Diagnosis not present

## 2023-08-28 ENCOUNTER — Other Ambulatory Visit (HOSPITAL_COMMUNITY): Payer: Self-pay

## 2023-08-29 ENCOUNTER — Other Ambulatory Visit (HOSPITAL_COMMUNITY): Payer: Self-pay

## 2023-09-03 ENCOUNTER — Encounter: Payer: Self-pay | Admitting: Internal Medicine

## 2023-09-04 ENCOUNTER — Other Ambulatory Visit (HOSPITAL_COMMUNITY): Payer: Self-pay

## 2023-09-04 MED ORDER — ISOSORBIDE MONONITRATE ER 60 MG PO TB24
60.0000 mg | ORAL_TABLET | Freq: Two times a day (BID) | ORAL | 3 refills | Status: DC
  Filled 2023-09-16: qty 180, 90d supply, fill #0
  Filled 2023-10-22: qty 180, 90d supply, fill #1

## 2023-09-04 MED ORDER — TRAZODONE HCL 100 MG PO TABS
200.0000 mg | ORAL_TABLET | Freq: Every evening | ORAL | 3 refills | Status: DC
  Filled 2023-09-19: qty 180, 90d supply, fill #0

## 2023-09-05 ENCOUNTER — Other Ambulatory Visit: Payer: Self-pay | Admitting: Cardiology

## 2023-09-05 ENCOUNTER — Other Ambulatory Visit (HOSPITAL_COMMUNITY): Payer: Self-pay

## 2023-09-05 ENCOUNTER — Other Ambulatory Visit: Payer: Self-pay | Admitting: Internal Medicine

## 2023-09-08 DIAGNOSIS — H16212 Exposure keratoconjunctivitis, left eye: Secondary | ICD-10-CM | POA: Diagnosis not present

## 2023-09-09 ENCOUNTER — Encounter (INDEPENDENT_AMBULATORY_CARE_PROVIDER_SITE_OTHER): Payer: Self-pay | Admitting: Internal Medicine

## 2023-09-09 ENCOUNTER — Other Ambulatory Visit (HOSPITAL_COMMUNITY): Payer: Self-pay

## 2023-09-09 ENCOUNTER — Ambulatory Visit (INDEPENDENT_AMBULATORY_CARE_PROVIDER_SITE_OTHER): Payer: HMO | Admitting: Internal Medicine

## 2023-09-09 VITALS — BP 130/82 | HR 56 | Temp 98.0°F | Ht 70.0 in | Wt 271.0 lb

## 2023-09-09 DIAGNOSIS — E88819 Insulin resistance, unspecified: Secondary | ICD-10-CM | POA: Diagnosis not present

## 2023-09-09 DIAGNOSIS — G4733 Obstructive sleep apnea (adult) (pediatric): Secondary | ICD-10-CM

## 2023-09-09 DIAGNOSIS — E66813 Obesity, class 3: Secondary | ICD-10-CM | POA: Diagnosis not present

## 2023-09-09 DIAGNOSIS — Z6838 Body mass index (BMI) 38.0-38.9, adult: Secondary | ICD-10-CM | POA: Diagnosis not present

## 2023-09-09 NOTE — Assessment & Plan Note (Signed)
 Overall [name] has lost 59 pounds which is 18% of total body weight on medically supervised weight management plan.  He continues to make steady progress and is now at a BMI of 38 which improved his candidacy for joint replacement surgery which she is happy about.  He will be seen orthopedic surgeon in the near future.  He is also to see his cardiologist for cardiac clearance as he had a cardiac event 6 to 9 months ago.  He will continue on medically supervised weight management plan.  He has declined antiobesity medications on multiple occasions.

## 2023-09-09 NOTE — Assessment & Plan Note (Signed)
 At the beginning of our program he had an insulin level of 35 consistent with hyperinsulinemia or insulin resistance.  Follow-up testing shows a reduction to 17.5 overall 50% reduction but still above optimal levels of less than 7.  This is complex condition associated with genetics, ectopic fat and lifestyle factors. Insulin resistance may result in increased fat storage, inhibition of the breakdown of fat, cause fluctuations in blood sugar leading to energy crashes and increased cravings for sugary or high carb foods and cause metabolic slowdown making it difficult to lose weight.  This may result in additional weight gain and lead to pre-diabetes and diabetes if untreated. In addition, hyperinsulinemia increases cardiovascular risk, chronic inflammatory response and may increase the risk of obesity related malignancies.  Lab Results  Component Value Date   HGBA1C 5.3 06/30/2023   Lab Results  Component Value Date   INSULIN 17.5 06/30/2023   INSULIN 35.5 (H) 07/09/2022   Lab Results  Component Value Date   GLUCOSE 93 03/19/2023   GLUCOSE 76 12/14/2014    We reviewed treatment options which include losing 7 to 10% of body weight, increasing volume of physical activity and maintaining a diet low in saturated fats and with a low glycemic load.  Patient has also been educated on the carb insulin model of obesity.  He has declined pharmacotherapy with metformin and GLP-1.

## 2023-09-09 NOTE — Progress Notes (Signed)
 Office: [phone]  /  Fax: [phone]  Weight Summary And Biometrics  Vitals Temp: 98 F (36.7 C) BP: 130/82 Pulse Rate: (!) 56 SpO2: 95 %   Anthropometric Measurements Height: 5' 10 (1.778 m) Weight: 271 lb (122.9 kg) BMI (Calculated): 38.88 Weight at Last Visit: 276 lb Weight Lost Since Last Visit: 5 lb Weight Gained Since Last Visit: 0 lb Starting Weight: 320 lb Total Weight Loss (lbs): 49 lb (22.2 kg) Peak Weight: 382 lb   Body Composition  Body Fat %: 38.6 % Fat Mass (lbs): 104.8 lbs Muscle Mass (lbs): 158.4 lbs Total Body Water  (lbs): 113.4 lbs Visceral Fat Rating : 25    RMR: [id number]  Today's Visit #: 17  Starting Date: 07/10/23   Subjective   Chief Complaint: Obesity  [name] is here to discuss his progress with his obesity treatment plan. He is on the the Category 3 Plan and states he is following his eating plan approximately 85 % of the time. He states he is doing housework/yard work exercising 20 minutes 7 times per week.  Interval History:   Since last office visit he has lost 5 pounds.  Bioimpedance information suggest further reductions in SAT, VAT and preservation of muscle mass. He reports good adherence to reduced calorie nutritional plan. He has been working on reading food labels, not skipping meals, increasing protein intake at every meal, drinking more water , making healthier choices, reducing portion sizes, and incorporating more whole foods   Last office visit we had advised patient to increase his calories due to reaching a plateau due to chronic caloric restriction.  He is scheduled to see his cardiologist for cardiac clearance as well as orthopedic surgeon for total knee replacement.  [name]:  Denies problems with appetite and hunger signals.  Denies problems with satiety and satiation.  Denies problems with eating patterns and portion [name].  Denies abnormal cravings. Denies feeling deprived or restricted.    Barriers identified: low volume of physical activity at present , orthopedic problems, medical conditions or chronic pain affecting mobility, medical comorbidities, slow metabolism for age, and sleep apnea.   Pharmacotherapy for weight loss: He is currently taking no anti-obesity medication and patient has declined pharmacotherapy in past.   Assessment and Plan   Treatment Plan For Obesity:  Recommended Dietary Goals  [name] is currently in the action stage of change. As such, his goal is to continue weight management plan. He has agreed to: continue current plan  Behavioral Intervention  We discussed the following Behavioral Modification Strategies today: continue to work on maintaining a reduced calorie state, getting the recommended amount of protein, incorporating whole foods, making healthy choices, staying well hydrated and practicing mindfulness when eating..  Additional resources provided today: None  Recommended Physical Activity Goals  [name] has been advised to work up to 150 minutes of moderate intensity aerobic activity a week and strengthening exercises 2-3 times per week for cardiovascular health, weight loss maintenance and preservation of muscle mass.   He has agreed to :  Think about enjoyable ways to increase daily physical activity and overcoming barriers to exercise and Increase physical activity in their day and reduce sedentary time (increase NEAT).  Pharmacotherapy  We discussed various medication options to help [name] with his weight loss efforts and we both agreed to : continue with nutritional and behavioral strategies and has declined pharmacotherapy  Associated Conditions Addressed and Impacted by Obesity Treatment  Insulin  resistance Assessment & Plan: At the beginning of our program  he had an insulin  level of 35 consistent with hyperinsulinemia or insulin  resistance.  Follow-up testing shows a reduction to 17.5 overall 50% reduction but still above  optimal levels of less than 7.  This is complex condition associated with genetics, ectopic fat and lifestyle factors. Insulin  resistance may result in increased fat storage, inhibition of the breakdown of fat, cause fluctuations in blood sugar leading to energy crashes and increased cravings for sugary or high carb foods and cause metabolic slowdown making it difficult to lose weight.  This may result in additional weight gain and lead to pre-diabetes and diabetes if untreated. In addition, hyperinsulinemia increases cardiovascular risk, chronic inflammatory response and may increase the risk of obesity related malignancies.  Lab Results  Component Value Date   HGBA1C 5.3 06/30/2023   Lab Results  Component Value Date   INSULIN  17.5 06/30/2023   INSULIN  35.5 (H) 07/09/2022   Lab Results  Component Value Date   GLUCOSE 93 03/19/2023   GLUCOSE 76 12/14/2014    We reviewed treatment options which include losing 7 to 10% of body weight, increasing volume of physical activity and maintaining a diet low in saturated fats and with a low glycemic load.  Patient has also been educated on the carb insulin  model of obesity.  He has declined pharmacotherapy with metformin and GLP-1.     OSA on CPAP Assessment & Plan: On CPAP with reported good compliance. Continue PAP therapy.  He has lost a total of 18% of total body weight may be worthwhile repeating sleep study to see if he still needs PAP therapy.    Class 3 severe obesity with serious comorbidity and body mass index (BMI) of 45.0 to 49.9 in adult, unspecified obesity type [facility]) Assessment & Plan: Overall [name] has lost 59 pounds which is 18% of total body weight on medically supervised weight management plan.  He continues to make steady progress and is now at a BMI of 38 which improved his candidacy for joint replacement surgery which she is happy about.  He will be seen orthopedic surgeon in the near future.  He is also to see his  cardiologist for cardiac clearance as he had a cardiac event 6 to 9 months ago.  He will continue on medically supervised weight management plan.  He has declined antiobesity medications on multiple occasions.      Objective   Physical Exam:  Blood pressure 130/82, pulse (!) 56, temperature 98 F (36.7 C), height 5' 10 (1.778 m), weight 271 lb (122.9 kg), SpO2 95%. Body mass index is 38.88 kg/m.  General: He is overweight, cooperative, alert, well developed, and in no acute distress. PSYCH: Has normal mood, affect and thought process.   HEENT: EOMI, sclerae are anicteric. Lungs: Normal breathing effort, no conversational dyspnea. Extremities: No edema.  Neurologic: No gross sensory or motor deficits. No tremors or fasciculations noted.    Diagnostic Data Reviewed:  BMET    Component Value Date/Time   NA 139 03/19/2023 0437   NA 143 10/08/2022 1226   K 3.8 03/19/2023 0437   CL 108 03/19/2023 0437   CO2 27 03/19/2023 0437   GLUCOSE 93 03/19/2023 0437   BUN 21 03/19/2023 0437   BUN 22 10/08/2022 1226   CREATININE 0.90 03/19/2023 0437   CALCIUM  8.3 (L) 03/19/2023 0437   GFRNONAA >60 03/19/2023 0437   GFRAA >60 02/10/2020 0937   Lab Results  Component Value Date   HGBA1C 5.3 06/30/2023   HGBA1C 5.4 07/09/2022  Lab Results  Component Value Date   INSULIN  17.5 06/30/2023   INSULIN  35.5 (H) 07/09/2022   Lab Results  Component Value Date   TSH 2.41 03/06/2023   CBC    Component Value Date/Time   WBC 9.8 03/19/2023 0437   RBC 4.87 03/19/2023 0437   HGB 13.8 03/19/2023 0437   HGB 14.5 11/23/2021 1024   HCT 42.1 03/19/2023 0437   HCT 43.9 11/23/2021 1024   PLT 217 03/19/2023 0437   PLT 239 11/23/2021 1024   MCV 86.4 03/19/2023 0437   MCV 87 11/23/2021 1024   MCH 28.3 03/19/2023 0437   MCHC 32.8 03/19/2023 0437   RDW 13.2 03/19/2023 0437   RDW 13.4 11/23/2021 1024   Iron Studies No results found for: IRON, TIBC, FERRITIN, IRONPCTSAT Lipid Panel      Component Value Date/Time   CHOL 60 03/18/2023 0635   CHOL 82 (L) 07/09/2022 0913   TRIG 56 03/18/2023 0635   HDL 20 (L) 03/18/2023 0635   HDL 28 (L) 07/09/2022 0913   CHOLHDL 3.0 03/18/2023 0635   VLDL 11 03/18/2023 0635   LDLCALC 29 03/18/2023 0635   LDLCALC 33 07/09/2022 0913   Hepatic Function Panel     Component Value Date/Time   PROT 7.0 03/06/2023 1552   PROT 6.7 10/08/2022 1226   ALBUMIN 4.2 03/06/2023 1552   ALBUMIN 4.1 10/08/2022 1226   AST 14 03/06/2023 1552   ALT 16 03/06/2023 1552   ALKPHOS 82 03/06/2023 1552   BILITOT 0.4 03/06/2023 1552   BILITOT 0.3 10/08/2022 1226   BILIDIR <0.10 02/13/2021 0830      Component Value Date/Time   TSH 2.41 03/06/2023 1552   Nutritional Lab Results  Component Value Date   VD25OH 61.4 10/08/2022   VD25OH 18.2 (L) 07/09/2022    Follow-Up   Return in about 4 weeks (around 10/07/2023) for For Weight Mangement with Dr. Francyne.SABRA He was informed of the importance of frequent follow up visits to maximize his success with intensive lifestyle modifications for his multiple health conditions.  Attestation Statement   Reviewed by clinician on day of visit: allergies, medications, problem list, medical history, surgical history, family history, social history, and previous encounter notes.     Lucas Francyne, MD

## 2023-09-09 NOTE — Assessment & Plan Note (Signed)
 On CPAP with reported good compliance. Continue PAP therapy.  He has lost a total of 18% of total body weight may be worthwhile repeating sleep study to see if he still needs PAP therapy.

## 2023-09-16 ENCOUNTER — Other Ambulatory Visit (HOSPITAL_COMMUNITY): Payer: Self-pay

## 2023-09-16 ENCOUNTER — Other Ambulatory Visit: Payer: Self-pay

## 2023-09-17 ENCOUNTER — Encounter: Payer: Self-pay | Admitting: Internal Medicine

## 2023-09-17 ENCOUNTER — Ambulatory Visit: Payer: HMO | Attending: Internal Medicine | Admitting: Internal Medicine

## 2023-09-17 VITALS — BP 110/64 | HR 57 | Ht 70.0 in | Wt 280.4 lb

## 2023-09-17 DIAGNOSIS — E785 Hyperlipidemia, unspecified: Secondary | ICD-10-CM | POA: Diagnosis not present

## 2023-09-17 DIAGNOSIS — I5032 Chronic diastolic (congestive) heart failure: Secondary | ICD-10-CM

## 2023-09-17 DIAGNOSIS — Z01810 Encounter for preprocedural cardiovascular examination: Secondary | ICD-10-CM | POA: Diagnosis not present

## 2023-09-17 DIAGNOSIS — I10 Essential (primary) hypertension: Secondary | ICD-10-CM

## 2023-09-17 DIAGNOSIS — I25118 Atherosclerotic heart disease of native coronary artery with other forms of angina pectoris: Secondary | ICD-10-CM | POA: Diagnosis not present

## 2023-09-17 MED ORDER — NITROGLYCERIN 0.3 MG SL SUBL: 0.3000 mg | SUBLINGUAL_TABLET | SUBLINGUAL | 3 refills | Status: AC | PRN

## 2023-09-17 NOTE — Progress Notes (Unsigned)
Cardiology Office Note:  .   Date:  09/18/2023  ID:  [name], DOB 1955-10-13, MRN [id number] PCP: Karie Schwalbe, MD  [facility] Providers Cardiologist:  Yvonne Kendall, MD     History of Present Illness: .   [name] is a 68 y.o. male with history of coronary artery disease, chronic HFpEF, hypertension, hyperlipidemia, and obstructive sleep apnea, who returns for follow-up of CAD and HFpEF.  I met him in 05/2023 after he transitioned his care from [facility] to [facility].  At that time, he reported episodes of recurrent chest tightness and dyspnea with physical activity.  We elected to increase atorvastatin to 60 mg twice daily, as catheterization in 02/2023 showed CTO of small D2 branch not amenable to PCI and otherwise nonobstructive CAD.  Today, [name] reports that he has been feeling fairly well.  He reports intermittent pain across his lower chest/upper abdomen that has been present since last summer.  It has not changed significantly with escalation of isosorbide mononitrate.  It typically only occurs when he is sitting, not when he is exerting himself or lying flat.  There are no associated symptoms.  He has rarely needed to use sublingual nitroglycerin for chest discomfort.  Overall, he feels like his chest pain is stable.  His activity has been limited on account of knee pain with plans to undergo knee replacements in the near future.  He was able to do strenuous activity this past weekend, taking 700 pounds worth of metal and lawn equipment to the dump.  He was able to complete this without any chest pain or dyspnea.  He denies pretibial/ankle edema but has swelling in both knees.  He has not had any palpitations, lightheadedness, or syncope.  He denies bleeding.  He is tolerating his medications well other than mild headaches in the mornings that resolve without intervention and are not limiting.  He continues to work on weight loss through diet and hopes that he  will be able to increase his activity after he undergoes knee replacements.  ROS: See HPI  Studies Reviewed: Marland Kitchen   EKG Interpretation Date/Time:  Wednesday September 17 2023 15:47:01 EST Ventricular Rate:  57 PR Interval:  196 QRS Duration:  88 QT Interval:  430 QTC Calculation: 418 R Axis:   -14  Text Interpretation: Sinus bradycardia Minimal voltage criteria for LVH, may be normal variant ( R in aVL ) Cannot rule out Anterior infarct , age undetermined versus lead placement When compared with ECG of 25-Mar-2023 14:20, No significant change was found Confirmed by Sandy Blouch, Cristal Deer [phone]) on 09/18/2023 6:56:38 AM    LHC (03/18/2023): Severe single-vessel coronary artery disease with chronic total occlusion of small D2 branch, which has progressed since last catheterization in 11/2021 (stenosis was approximately 50% at that time).  Otherwise, stable appearance of mild-moderate, nonobstructive CAD. Normal left ventricular filling pressure.   TTE (03/18/2023):  1. Left ventricular ejection fraction, by estimation, is 55 to 60%. The  left ventricle has normal function. Left ventricular endocardial border  not optimally defined to evaluate regional wall motion. There is moderate  left ventricular hypertrophy. Left  ventricular diastolic parameters are consistent with Grade I diastolic  dysfunction (impaired relaxation).   2. Right ventricular systolic function is normal. The right ventricular  size is normal. Tricuspid regurgitation signal is inadequate for assessing  PA pressure.   3. Right atrial size was mildly dilated.   4. The mitral valve is normal in structure. No evidence of mitral valve  regurgitation. No evidence of mitral stenosis.   5. The aortic valve is normal in structure. Aortic valve regurgitation is  not visualized. No aortic stenosis is present.   6. Aortic dilatation noted. There is mild dilatation of the ascending  aorta, measuring 40 mm.   Risk Assessment/Calculations:              Physical Exam:   VS:  BP 110/64 (BP Location: Left Arm, Patient Position: Sitting, Cuff Size: Large)   Pulse (!) 57   Ht 5\' 10"  (1.778 m)   Wt 280 lb 6.4 oz (127.2 kg)   SpO2 96%   BMI 40.23 kg/m    Wt Readings from Last 3 Encounters:  09/17/23 280 lb 6.4 oz (127.2 kg)  09/09/23 271 lb (122.9 kg)  08/22/23 285 lb 3.2 oz (129.4 kg)    General:  NAD. Neck: No JVD or HJR. Lungs: Clear to auscultation bilaterally without wheezes or crackles. Heart: Regular rate and rhythm without murmurs, rubs, or gallops. Abdomen: Soft, nontender, nondistended. Extremities: No lower extremity edema.  ASSESSMENT AND PLAN: .    Coronary artery disease with stable angina: Overall, [name] seems to be doing well with fairly minimal exertional chest pain.  His chest pain that occurs only when seated is unlikely to be cardiac in nature.  While his mobility has been limited due to arthritis in his knees, he was able to do strenuous activity this past weekend without any difficulty.  I recommend continuation of his current regimen of amlodipine, isosorbide mononitrate, and metoprolol succinate for antianginal therapy as well as aspirin and atorvastatin for secondary prevention of CAD.  Chronic HFpEF: [name] appears euvolemic with stable NYHA class II symptoms.  Continue furosemide 20 mg daily with additional dose as needed for weight gain/edema.  Hypertension: Blood pressure well-controlled today.  Continue current regimen of amlodipine, isosorbide mononitrate, and metoprolol succinate.  Hyperlipidemia: Lipids well-controlled on last check in 02/2023.  Continue atorvastatin 40 mg daily.  Morbid obesity: BMI remains greater than 40, though [name] continues to work on weight loss.  I encouraged him to keep working on this.  Preoperative cardiovascular risk assessment: [name] anticipates needing to undergo staged bilateral knee replacements with Dr. Magnus Ivan in the coming  months.  He does not have any unstable cardiac symptoms.  Though his mobility is limited, he is able to perform greater than 4 METS of activity without any cardiac symptoms.  I think it is reasonable for him to move forward with elective orthopedic surgery, which is generally low risk from a cardiovascular standpoint, without additional testing or intervention.  If possible, I recommend continuation of aspirin 81 mg daily during the perioperative period.    Dispo: Return to clinic in 6 months.  Signed, Yvonne Kendall, MD

## 2023-09-17 NOTE — Patient Instructions (Signed)
 Medication Instructions:  Your physician recommends that you continue on your current medications as directed. Please refer to the Current Medication list given to you today.   *If you need a refill on your cardiac medications before your next appointment, please call your pharmacy*   Lab Work: No labs ordered today    Testing/Procedures: No test ordered today    Follow-Up: At [facility], you and your health needs are our priority.  As part of our continuing mission to provide you with exceptional heart care, we have created designated Provider Care Teams.  These Care Teams include your primary Cardiologist (physician) and Advanced Practice Providers (APPs -  Physician Assistants and Nurse Practitioners) who all work together to provide you with the care you need, when you need it.  We recommend signing up for the patient portal called "MyChart".  Sign up information is provided on this After Visit Summary.  MyChart is used to connect with patients for Virtual Visits (Telemedicine).  Patients are able to view lab/test results, encounter notes, upcoming appointments, etc.  Non-urgent messages can be sent to your provider as well.   To learn more about what you can do with MyChart, go to [email or web address].    Your next appointment:   6 month(s)  Provider:   You may see Yvonne Kendall, MD or one of the following Advanced Practice Providers on your designated Care Team:   Nicolasa Ducking, NP Eula Listen, PA-C Cadence Fransico Michael, PA-C Charlsie Quest, NP Carlos Levering, NP

## 2023-09-18 ENCOUNTER — Encounter: Payer: Self-pay | Admitting: Internal Medicine

## 2023-09-18 DIAGNOSIS — H02844 Edema of left upper eyelid: Secondary | ICD-10-CM | POA: Diagnosis not present

## 2023-09-19 ENCOUNTER — Other Ambulatory Visit (HOSPITAL_COMMUNITY): Payer: Self-pay

## 2023-09-19 ENCOUNTER — Other Ambulatory Visit: Payer: Self-pay

## 2023-09-22 ENCOUNTER — Encounter: Payer: Self-pay | Admitting: Orthopaedic Surgery

## 2023-09-22 ENCOUNTER — Ambulatory Visit (INDEPENDENT_AMBULATORY_CARE_PROVIDER_SITE_OTHER): Payer: Self-pay | Admitting: Orthopaedic Surgery

## 2023-09-22 VITALS — Ht 70.0 in | Wt 279.0 lb

## 2023-09-22 DIAGNOSIS — M25562 Pain in left knee: Secondary | ICD-10-CM

## 2023-09-22 DIAGNOSIS — M1712 Unilateral primary osteoarthritis, left knee: Secondary | ICD-10-CM

## 2023-09-22 DIAGNOSIS — M1711 Unilateral primary osteoarthritis, right knee: Secondary | ICD-10-CM

## 2023-09-22 DIAGNOSIS — M25561 Pain in right knee: Secondary | ICD-10-CM

## 2023-09-22 DIAGNOSIS — G8929 Other chronic pain: Secondary | ICD-10-CM

## 2023-09-22 NOTE — Progress Notes (Signed)
The patient comes in today to discuss knee replacement surgery.  We had to wait him on our scale because it is the same skills that are used over the hospital for his preoperative appointment when they are making medication decisions and having him evaluated from an anesthesia standpoint usually a week before surgery.  Although his BMI was listed at 9 when he was at the [facility], they unfortunately used a different scale that is used here and at the hospital.  His BMI today is 40.03 and thus I still cannot schedule his surgery according to [facility] and that insurance plans requirement for scheduling total joint surgery.  We will need to see him back once that is a recorded number on our scales in the hospital scale of a BMI below 40.  This is frustrating to the patient and is frustrating to me as well.

## 2023-10-06 ENCOUNTER — Ambulatory Visit (INDEPENDENT_AMBULATORY_CARE_PROVIDER_SITE_OTHER): Payer: HMO | Admitting: Internal Medicine

## 2023-10-06 ENCOUNTER — Encounter: Payer: Self-pay | Admitting: Internal Medicine

## 2023-10-06 VITALS — BP 126/68 | HR 59 | Temp 97.7°F | Ht 70.0 in | Wt 278.0 lb

## 2023-10-06 DIAGNOSIS — F330 Major depressive disorder, recurrent, mild: Secondary | ICD-10-CM

## 2023-10-06 MED ORDER — METHYLPHENIDATE HCL 5 MG PO TABS: 5.0000 mg | ORAL_TABLET | Freq: Two times a day (BID) | ORAL | 0 refills | Status: DC

## 2023-10-06 NOTE — Assessment & Plan Note (Signed)
 Chronic and ongoing but now worse Related to increased pain, limitations in activity due to knee pain--but then inability to have needed surgery due to weight, etc No suicidal ideation On duloxetine , bupropion , trazodone  Discussed aripiprazole--will avoid due to concerns about weight Refer to psychology Trial of ritalin  5mg  bid---consider increase
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
## 2023-10-07 ENCOUNTER — Encounter (INDEPENDENT_AMBULATORY_CARE_PROVIDER_SITE_OTHER): Payer: Self-pay | Admitting: Internal Medicine

## 2023-10-07 ENCOUNTER — Ambulatory Visit (INDEPENDENT_AMBULATORY_CARE_PROVIDER_SITE_OTHER): Payer: HMO | Admitting: Internal Medicine

## 2023-10-07 VITALS — BP 130/82 | HR 80 | Temp 97.9°F | Ht 70.0 in | Wt 272.0 lb

## 2023-10-07 DIAGNOSIS — G8929 Other chronic pain: Secondary | ICD-10-CM

## 2023-10-07 DIAGNOSIS — I25118 Atherosclerotic heart disease of native coronary artery with other forms of angina pectoris: Secondary | ICD-10-CM

## 2023-10-07 DIAGNOSIS — M25562 Pain in left knee: Secondary | ICD-10-CM | POA: Diagnosis not present

## 2023-10-07 DIAGNOSIS — E66813 Obesity, class 3: Secondary | ICD-10-CM | POA: Diagnosis not present

## 2023-10-07 DIAGNOSIS — Z6839 Body mass index (BMI) 39.0-39.9, adult: Secondary | ICD-10-CM

## 2023-10-07 DIAGNOSIS — M25561 Pain in right knee: Secondary | ICD-10-CM | POA: Diagnosis not present

## 2023-10-07 DIAGNOSIS — I10 Essential (primary) hypertension: Secondary | ICD-10-CM | POA: Diagnosis not present

## 2023-10-07 DIAGNOSIS — G4733 Obstructive sleep apnea (adult) (pediatric): Secondary | ICD-10-CM

## 2023-10-07 NOTE — Assessment & Plan Note (Signed)
Blood pressure at goal for age and risk category.  On amlodipine 5 mg a day, metoprolol, isosorbide without adverse effects.    Continue with weight loss therapy.  Monitor for symptoms of orthostasis while losing weight. Continue current regimen and home monitoring for a goal blood pressure of 120/80.

## 2023-10-07 NOTE — Assessment & Plan Note (Signed)
Patient expresses frustration.  Has been working hard at losing weight and has made good progress but was recently turned down as his BMI was 40 he is considering obtaining a second opinion.

## 2023-10-07 NOTE — Progress Notes (Signed)
Office: [phone]  /  Fax: [phone]  Weight Summary And Biometrics  Vitals Temp: 97.9 F (36.6 C) BP: 130/82 Pulse Rate: 80 SpO2: 96 %   Anthropometric Measurements Height: 5\' 10"  (1.778 m) Weight: 272 lb (123.4 kg) BMI (Calculated): 39.03 Weight at Last Visit: 271 lb Weight Lost Since Last Visit: 0 lb Weight Gained Since Last Visit: 1 lb Starting Weight: 320 lb Peak Weight: 382 lb   Body Composition  Body Fat %: 39.3 % Fat Mass (lbs): 107.2 lbs Muscle Mass (lbs): 157.2 lbs Total Body Water (lbs): 113.2 lbs Visceral Fat Rating : 25    RMR: [id number]  Today's Visit #: 18  Starting Date: 07/10/23   Subjective   Chief Complaint: Obesity  [name] is here to discuss his progress with his obesity treatment plan. He is on the Category 3 meal plan and states he is following his eating plan approximately 85 % of the time. He states he is  not exercising.  Weight Progress Since Last Visit:  Since last office visit he has gained 1 pounds.  He expresses frustration with recent encounter with orthopedic surgeon as his BMI at the time was 40 and surgeon would like him to be under 40 to complete surgery. He reports good adherence to reduced calorie nutritional plan. He has been working on reading food labels, not skipping meals, increasing protein intake at every meal, drinking more water, making healthier choices, reducing portion sizes, and incorporating more whole foods   Challenges affecting patient progress: orthopedic problems, medical conditions or chronic pain affecting mobility and medical comorbidities.   Orexigenic Control: Denies problems with appetite and hunger signals.  Denies problems with satiety and satiation.  Denies problems with eating patterns and portion control.  Denies abnormal cravings. Denies feeling deprived or restricted.   Pharmacotherapy for weight management: He is currently taking no anti-obesity medication and patient has declined  pharmacotherapy in past.   Assessment and Plan   Treatment Plan For Obesity:  Recommended Dietary Goals  Sander is currently in the action stage of change. As such, his goal is to continue weight management plan. He has agreed to: continue current plan  Behavioral Health and Counseling  We discussed the following behavioral modification strategies today: continue to work on maintaining a reduced calorie state, getting the recommended amount of protein, incorporating whole foods, making healthy choices, staying well hydrated and practicing mindfulness when eating..  Additional education and resources provided today: None  Recommended Physical Activity Goals  [name] has been advised to work up to 150 minutes of moderate intensity aerobic activity a week and strengthening exercises 2-3 times per week for cardiovascular health, weight loss maintenance and preservation of muscle mass.   He has agreed to :  Think about enjoyable ways to increase daily physical activity and overcoming barriers to exercise and Increase physical activity in their day and reduce sedentary time (increase NEAT).  Pharmacotherapy  We discussed various medication options to help [name] with his weight loss efforts and we both agreed to : continue with nutritional and behavioral strategies and has declined pharmacotherapy  Associated Conditions Impacted by Obesity Treatment  Coronary artery disease of native artery of native heart with stable angina pectoris [facility]) Assessment & Plan: Currently asymptomatic.  On isosorbide, aspirin, atorvastatin, metoprolol.   May benefit from incretin therapy.  At present time he is not interested due to concerns about long-term reliance on medication and cost    Essential hypertension Assessment & Plan: Blood pressure at goal  for age and risk category.  On amlodipine 5 mg a day, metoprolol, isosorbide without adverse effects.    Continue with weight loss therapy.  Monitor for  symptoms of orthostasis while losing weight. Continue current regimen and home monitoring for a goal blood pressure of 120/80.    OSA on CPAP Assessment & Plan: On CPAP with reported good compliance. Continue PAP therapy.  He has lost a total of 18% of total body weight may be worthwhile repeating sleep study to see if he still needs PAP therapy.    Class 3 severe obesity with serious comorbidity and body mass index (BMI) of 45.0 to 49.9 in adult, unspecified obesity type [facility]) Assessment & Plan: Overall [name] has lost 59 pounds which is 18% of total body weight on medically supervised weight management plan.  He is somewhat frustrated about discrepancies between scales and the fact that his BMI was just above 40 when he went to see surgeon and therefore was turned down again.  His last BMI was 38.  He will continue working on medically supervised weight management plan.  He has declined pharmacotherapy.  He will likely need to lose about 10 to 15 pounds to ensure that his BMI is under 40 based on surgeons office scale which seems to be off by 5 to 7 pounds.   Bilateral chronic knee pain Assessment & Plan: Patient expresses frustration.  Has been working hard at losing weight and has made good progress but was recently turned down as his BMI was 40 he is considering obtaining a second opinion.         Objective   Physical Exam:  Blood pressure 130/82, pulse 80, temperature 97.9 F (36.6 C), height 5\' 10"  (1.778 m), weight 272 lb (123.4 kg), SpO2 96%. Body mass index is 39.03 kg/m.  General: He is overweight, cooperative, alert, well developed, and in no acute distress. PSYCH: Has normal mood, affect and thought process.   HEENT: EOMI, sclerae are anicteric. Lungs: Normal breathing effort, no conversational dyspnea. Extremities: No edema.  Neurologic: No gross sensory or motor deficits. No tremors or fasciculations noted.    Diagnostic Data Reviewed:  BMET    Component  Value Date/Time   NA 139 03/19/2023 0437   NA 143 10/08/2022 1226   K 3.8 03/19/2023 0437   CL 108 03/19/2023 0437   CO2 27 03/19/2023 0437   GLUCOSE 93 03/19/2023 0437   BUN 21 03/19/2023 0437   BUN 22 10/08/2022 1226   CREATININE 0.90 03/19/2023 0437   CALCIUM 8.3 (L) 03/19/2023 0437   GFRNONAA >60 03/19/2023 0437   GFRAA >60 02/10/2020 0937   Lab Results  Component Value Date   HGBA1C 5.3 06/30/2023   HGBA1C 5.4 07/09/2022   Lab Results  Component Value Date   INSULIN 17.5 06/30/2023   INSULIN 35.5 (H) 07/09/2022   Lab Results  Component Value Date   TSH 2.41 03/06/2023   CBC    Component Value Date/Time   WBC 9.8 03/19/2023 0437   RBC 4.87 03/19/2023 0437   HGB 13.8 03/19/2023 0437   HGB 14.5 11/23/2021 1024   HCT 42.1 03/19/2023 0437   HCT 43.9 11/23/2021 1024   PLT 217 03/19/2023 0437   PLT 239 11/23/2021 1024   MCV 86.4 03/19/2023 0437   MCV 87 11/23/2021 1024   MCH 28.3 03/19/2023 0437   MCHC 32.8 03/19/2023 0437   RDW 13.2 03/19/2023 0437   RDW 13.4 11/23/2021 1024   Iron Studies No results  found for: "IRON", "TIBC", "FERRITIN", "IRONPCTSAT" Lipid Panel     Component Value Date/Time   CHOL 60 03/18/2023 0635   CHOL 82 (L) 07/09/2022 0913   TRIG 56 03/18/2023 0635   HDL 20 (L) 03/18/2023 0635   HDL 28 (L) 07/09/2022 0913   CHOLHDL 3.0 03/18/2023 0635   VLDL 11 03/18/2023 0635   LDLCALC 29 03/18/2023 0635   LDLCALC 33 07/09/2022 0913   Hepatic Function Panel     Component Value Date/Time   PROT 7.0 03/06/2023 1552   PROT 6.7 10/08/2022 1226   ALBUMIN 4.2 03/06/2023 1552   ALBUMIN 4.1 10/08/2022 1226   AST 14 03/06/2023 1552   ALT 16 03/06/2023 1552   ALKPHOS 82 03/06/2023 1552   BILITOT 0.4 03/06/2023 1552   BILITOT 0.3 10/08/2022 1226   BILIDIR <0.10 02/13/2021 0830      Component Value Date/Time   TSH 2.41 03/06/2023 1552   Nutritional Lab Results  Component Value Date   VD25OH 61.4 10/08/2022   VD25OH 18.2 (L) 07/09/2022     Follow-Up   Return in about 4 weeks (around 11/04/2023) for For Weight Mangement with Dr. Rikki Spearing.Marland Kitchen He was informed of the importance of frequent follow up visits to maximize his success with intensive lifestyle modifications for his multiple health conditions.  Attestation Statement   Reviewed by clinician on day of visit: allergies, medications, problem list, medical history, surgical history, family history, social history, and previous encounter notes.     Worthy Rancher, MD

## 2023-10-07 NOTE — Assessment & Plan Note (Signed)
Currently asymptomatic.  On isosorbide, aspirin, atorvastatin, metoprolol.   May benefit from incretin therapy.  At present time he is not interested due to concerns about long-term reliance on medication and cost

## 2023-10-07 NOTE — Assessment & Plan Note (Signed)
Overall [name] has lost 59 pounds which is 18% of total body weight on medically supervised weight management plan.  He is somewhat frustrated about discrepancies between scales and the fact that his BMI was just above 40 when he went to see surgeon and therefore was turned down again.  His last BMI was 38.  He will continue working on medically supervised weight management plan.  He has declined pharmacotherapy.  He will likely need to lose about 10 to 15 pounds to ensure that his BMI is under 40 based on surgeons office scale which seems to be off by 5 to 7 pounds.

## 2023-10-07 NOTE — Assessment & Plan Note (Signed)
On CPAP with reported good compliance. Continue PAP therapy.  He has lost a total of 18% of total body weight may be worthwhile repeating sleep study to see if he still needs PAP therapy.

## 2023-10-13 ENCOUNTER — Other Ambulatory Visit (HOSPITAL_BASED_OUTPATIENT_CLINIC_OR_DEPARTMENT_OTHER): Payer: Self-pay

## 2023-10-16 ENCOUNTER — Encounter: Payer: Self-pay | Admitting: Internal Medicine

## 2023-10-21 ENCOUNTER — Ambulatory Visit: Payer: HMO | Admitting: Clinical

## 2023-10-21 DIAGNOSIS — F331 Major depressive disorder, recurrent, moderate: Secondary | ICD-10-CM
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
## 2023-10-21 NOTE — Progress Notes (Signed)
   Kevin Barthel, LCSW

## 2023-10-22 ENCOUNTER — Encounter: Payer: Self-pay | Admitting: Internal Medicine

## 2023-10-22 ENCOUNTER — Other Ambulatory Visit: Payer: Self-pay

## 2023-10-22 ENCOUNTER — Other Ambulatory Visit (HOSPITAL_COMMUNITY): Payer: Self-pay

## 2023-10-22 DIAGNOSIS — E039 Hypothyroidism, unspecified: Secondary | ICD-10-CM

## 2023-10-22 MED ORDER — LEVOTHYROXINE SODIUM 88 MCG PO TABS
88.0000 ug | ORAL_TABLET | Freq: Every day | ORAL | 3 refills | Status: AC
Start: 2023-10-22 — End: ?
  Filled 2023-10-22: qty 90, 90d supply, fill #0
  Filled 2023-12-11 – 2024-01-18 (×2): qty 90, 90d supply, fill #1
  Filled 2024-04-19: qty 90, 90d supply, fill #2
  Filled 2024-06-29: qty 90, 90d supply, fill #3

## 2023-10-23 ENCOUNTER — Other Ambulatory Visit (HOSPITAL_COMMUNITY): Payer: Self-pay

## 2023-10-24 ENCOUNTER — Other Ambulatory Visit (HOSPITAL_COMMUNITY): Payer: Self-pay

## 2023-10-27 ENCOUNTER — Other Ambulatory Visit (HOSPITAL_COMMUNITY): Payer: Self-pay

## 2023-10-27 ENCOUNTER — Other Ambulatory Visit: Payer: Self-pay

## 2023-10-27 ENCOUNTER — Ambulatory Visit (INDEPENDENT_AMBULATORY_CARE_PROVIDER_SITE_OTHER): Payer: HMO | Admitting: Internal Medicine

## 2023-10-27 ENCOUNTER — Encounter: Payer: Self-pay | Admitting: Internal Medicine

## 2023-10-27 VITALS — BP 102/70 | HR 62 | Temp 97.6°F | Ht 70.0 in | Wt 277.0 lb

## 2023-10-27 DIAGNOSIS — F330 Major depressive disorder, recurrent, mild: Secondary | ICD-10-CM | POA: Diagnosis not present

## 2023-10-27 DIAGNOSIS — L03213 Periorbital cellulitis: Secondary | ICD-10-CM | POA: Diagnosis not present

## 2023-10-27 MED ORDER — METHYLPHENIDATE HCL 10 MG PO TABS
10.0000 mg | ORAL_TABLET | Freq: Two times a day (BID) | ORAL | 0 refills | Status: DC
  Filled 2023-10-27: qty 60, 30d supply, fill #0
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
## 2023-10-27 NOTE — Assessment & Plan Note (Signed)
 Some better--energy up a little with the ritalin. Will try increasing to 10 twice a day [place] start with Clydie Braun for counseling Continue duloxetine , bupropion and trazodone
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
## 2023-10-30 ENCOUNTER — Encounter: Payer: Self-pay | Admitting: Urology

## 2023-10-30 ENCOUNTER — Ambulatory Visit: Payer: HMO | Admitting: Urology

## 2023-10-30 VITALS — BP 111/74 | HR 60 | Ht 70.0 in | Wt 278.0 lb

## 2023-10-30 DIAGNOSIS — E291 Testicular hypofunction: Secondary | ICD-10-CM | POA: Diagnosis not present

## 2023-10-30 DIAGNOSIS — R319 Hematuria, unspecified: Secondary | ICD-10-CM | POA: Diagnosis not present

## 2023-10-30 DIAGNOSIS — N529 Male erectile dysfunction, unspecified: Secondary | ICD-10-CM | POA: Diagnosis not present

## 2023-10-30 DIAGNOSIS — N401 Enlarged prostate with lower urinary tract symptoms: Secondary | ICD-10-CM

## 2023-10-31 ENCOUNTER — Other Ambulatory Visit: Payer: Self-pay | Admitting: Urology

## 2023-10-31 ENCOUNTER — Other Ambulatory Visit (HOSPITAL_COMMUNITY): Payer: Self-pay

## 2023-10-31 ENCOUNTER — Other Ambulatory Visit: Payer: Self-pay | Admitting: Medical

## 2023-10-31 ENCOUNTER — Other Ambulatory Visit: Payer: Self-pay

## 2023-10-31 DIAGNOSIS — E291 Testicular hypofunction: Secondary | ICD-10-CM

## 2023-10-31 LAB — TESTOSTERONE: Testosterone: 281 ng/dL (ref 264–916)

## 2023-10-31 LAB — HEMOGLOBIN AND HEMATOCRIT, BLOOD
Hematocrit: 43.9 % (ref 37.5–51.0)
Hemoglobin: 14.5 g/dL (ref 13.0–17.7)

## 2023-10-31 LAB — PSA: Prostate Specific Ag, Serum: 0.4 ng/mL (ref 0.0–4.0)

## 2023-10-31 MED ORDER — TESTOSTERONE CYPIONATE 200 MG/ML IM SOLN
200.0000 mg | INTRAMUSCULAR | 0 refills | Status: DC
Start: 2023-10-31 — End: 2024-03-18

## 2023-10-31 MED ORDER — FUROSEMIDE 20 MG PO TABS
ORAL_TABLET | ORAL | 3 refills | Status: DC
  Filled 2023-10-31: qty 90, 45d supply, fill #0
  Filled 2023-12-11: qty 90, 45d supply, fill #1
  Filled 2024-01-18: qty 90, 45d supply, fill #2
  Filled 2024-03-10: qty 90, 45d supply, fill #3

## 2023-10-31 MED FILL — Trazodone HCl Tab 100 MG: ORAL | 90 days supply | Qty: 180 | Fill #0 | Status: CN

## 2023-10-31 NOTE — Telephone Encounter (Signed)
 Spoke with patient on the phone and scheduled appointment based on their and our availability

## 2023-10-31 NOTE — Telephone Encounter (Signed)
 This is a Educational psychologist pt

## 2023-11-04 ENCOUNTER — Encounter (INDEPENDENT_AMBULATORY_CARE_PROVIDER_SITE_OTHER): Payer: Self-pay | Admitting: Internal Medicine

## 2023-11-04 ENCOUNTER — Ambulatory Visit (INDEPENDENT_AMBULATORY_CARE_PROVIDER_SITE_OTHER): Payer: HMO | Admitting: Internal Medicine

## 2023-11-04 ENCOUNTER — Telehealth: Payer: Self-pay | Admitting: Urology

## 2023-11-04 VITALS — BP 104/62 | HR 62 | Temp 97.9°F | Ht 70.0 in | Wt 271.0 lb

## 2023-11-04 DIAGNOSIS — E88819 Insulin resistance, unspecified: Secondary | ICD-10-CM | POA: Diagnosis not present

## 2023-11-04 DIAGNOSIS — Z6838 Body mass index (BMI) 38.0-38.9, adult: Secondary | ICD-10-CM

## 2023-11-04 DIAGNOSIS — M25562 Pain in left knee: Secondary | ICD-10-CM | POA: Diagnosis not present

## 2023-11-04 DIAGNOSIS — G4733 Obstructive sleep apnea (adult) (pediatric): Secondary | ICD-10-CM | POA: Diagnosis not present

## 2023-11-04 DIAGNOSIS — G8929 Other chronic pain: Secondary | ICD-10-CM | POA: Diagnosis not present

## 2023-11-04 DIAGNOSIS — E66813 Obesity, class 3: Secondary | ICD-10-CM

## 2023-11-04 DIAGNOSIS — F330 Major depressive disorder, recurrent, mild: Secondary | ICD-10-CM

## 2023-11-04 DIAGNOSIS — I10 Essential (primary) hypertension: Secondary | ICD-10-CM | POA: Diagnosis not present

## 2023-11-04 DIAGNOSIS — M25561 Pain in right knee: Secondary | ICD-10-CM

## 2023-11-04 NOTE — Assessment & Plan Note (Signed)
 Improving.  Currently on Ritalin, Wellbutrin and trazodone.  Patient advised to monitor for symptoms of worsening angina.  At present time blood pressure and heart rate are well-controlled.  I also recommend increasing physical activity and daylight exposure.  His mood would improve if he was able to have knee surgery as this is affecting his quality of life.

## 2023-11-04 NOTE — Assessment & Plan Note (Signed)
 On CPAP with reported good compliance. Continue PAP therapy.  He has lost a total of 18% of total body weight may be worthwhile repeating sleep study to see if he still needs PAP therapy.

## 2023-11-04 NOTE — Telephone Encounter (Signed)
 Samuel Bouche from [facility] called and is requesting more clinical details regarding patients RX for Testosterone. He can be reached at [phone] option 2.

## 2023-11-04 NOTE — Assessment & Plan Note (Signed)
 At the beginning of our program he had an insulin level of 35 consistent with hyperinsulinemia or insulin resistance.  Follow-up testing shows a reduction to 17.5 overall 50% reduction but still above optimal levels of less than 7.  This is complex condition associated with genetics, ectopic fat and lifestyle factors. Insulin resistance may result in increased fat storage, inhibition of the breakdown of fat, cause fluctuations in blood sugar leading to energy crashes and increased cravings for sugary or high carb foods and cause metabolic slowdown making it difficult to lose weight.  This may result in additional weight gain and lead to pre-diabetes and diabetes if untreated. In addition, hyperinsulinemia increases cardiovascular risk, chronic inflammatory response and may increase the risk of obesity related malignancies.  Lab Results  Component Value Date   HGBA1C 5.3 06/30/2023   Lab Results  Component Value Date   INSULIN 17.5 06/30/2023   INSULIN 35.5 (H) 07/09/2022   Lab Results  Component Value Date   GLUCOSE 93 03/19/2023   GLUCOSE 76 12/14/2014    We reviewed treatment options which include losing 7 to 10% of body weight, increasing volume of physical activity and maintaining a diet low in saturated fats and with a low glycemic load.  Patient has also been educated on the carb insulin model of obesity.  He has declined pharmacotherapy with metformin and GLP-1.

## 2023-11-04 NOTE — Progress Notes (Signed)
 Office: [phone]  /  Fax: [phone]  Weight Summary And Biometrics  Vitals Temp: 97.9 F (36.6 C) BP: 104/62 Pulse Rate: 62 SpO2: 95 %   Anthropometric Measurements Height: 5\' 10"  (1.778 m) Weight: 271 lb (122.9 kg) BMI (Calculated): 38.88 Weight at Last Visit: 272 lb Weight Lost Since Last Visit: 1 Weight Gained Since Last Visit: 0 Starting Weight: 320 lb Total Weight Loss (lbs): 49 lb (22.2 kg) Peak Weight: 382 lb   Body Composition  Body Fat %: 39.4 % Fat Mass (lbs): 106.8 lbs Muscle Mass (lbs): 156.2 lbs Total Body Water (lbs): 13.6 lbs Visceral Fat Rating : 25    RMR: 1656  Today's Visit #: 19  Starting Date: 07/10/23   Subjective   Chief Complaint: Obesity  Interval History Discussed the use of AI scribe software for clinical note transcription with the patient, who gave verbal consent to proceed.  History of Present Illness   [name] presents today for medical weight management.  Since last office visit he has lost 1 pound he reports good adherence to category 3 meal plan.  He also tells me that he has been diagnosed with depression and is currently seeing a therapist. He takes 20 mg of Ritalin daily, which has improved his mental state but has led to a loss of appetite. Despite this, he maintains a daily intake of around 1500 calories, including adequate protein through meals and a daily protein shake, except on Sundays. He reports sleep disturbances related to depression, for which he is taking Trazodone. He uses a CPAP machine nightly and monitors his sleep patterns through an app. He does not have trouble falling asleep but wakes frequently, often due to his dog, and can usually return to sleep quickly.   He experiences knee pain and is considering knee replacement surgery. His current orthopedic surgeon requires a BMI below 40 for surgery approval. His last recorded BMI was 40.03.  Today in our office is 61 he has lost approximately 58 pounds.   He is actively trying to manage his weight and is aware of the impact of his knee condition on his overall health.  He is very eager to having surgery to improve his quality of life and mobility       Challenges affecting patient progress: orthopedic problems, medical conditions or chronic pain affecting mobility and mental health .    Pharmacotherapy for weight management: He is currently taking no anti-obesity medication and patient has declined pharmacotherapy in past.   Assessment and Plan   Treatment Plan For Obesity:  Recommended Dietary Goals  [name] is currently in the action stage of change. As such, his goal is to continue weight management plan. He has agreed to: continue current plan  Behavioral Health and Counseling  We discussed the following behavioral modification strategies today: continue to work on maintaining a reduced calorie state, getting the recommended amount of protein, incorporating whole foods, making healthy choices, staying well hydrated and practicing mindfulness when eating..  Additional education and resources provided today: None  Recommended Physical Activity Goals  [name] has been advised to work up to 150 minutes of moderate intensity aerobic activity a week and strengthening exercises 2-3 times per week for cardiovascular health, weight loss maintenance and preservation of muscle mass.   He has agreed to :  Think about enjoyable ways to increase daily physical activity and overcoming barriers to exercise and Increase physical activity in their day and reduce sedentary time (increase NEAT).  Pharmacotherapy  We  discussed various medication options to help [name] with his weight loss efforts and we both agreed to : continue with nutritional and behavioral strategies and has declined pharmacotherapy  Associated Conditions Impacted by Obesity Treatment  Essential hypertension Assessment & Plan: Blood pressure at goal for age and risk category.   On amlodipine 5 mg a day, metoprolol, isosorbide without adverse effects.  He is on Ritalin and Wellbutrin for depression and at present time heart rate and blood pressure well-controlled.  Continue with weight loss therapy.  Monitor for symptoms of orthostasis while losing weight. Continue current regimen and home monitoring for a goal blood pressure of 120/80.    OSA on CPAP Assessment & Plan: On CPAP with reported good compliance. Continue PAP therapy.  He has lost a total of 18% of total body weight may be worthwhile repeating sleep study to see if he still needs PAP therapy.    Insulin resistance Assessment & Plan: At the beginning of our program he had an insulin level of 35 consistent with hyperinsulinemia or insulin resistance.  Follow-up testing shows a reduction to 17.5 overall 50% reduction but still above optimal levels of less than 7.  This is complex condition associated with genetics, ectopic fat and lifestyle factors. Insulin resistance may result in increased fat storage, inhibition of the breakdown of fat, cause fluctuations in blood sugar leading to energy crashes and increased cravings for sugary or high carb foods and cause metabolic slowdown making it difficult to lose weight.  This may result in additional weight gain and lead to pre-diabetes and diabetes if untreated. In addition, hyperinsulinemia increases cardiovascular risk, chronic inflammatory response and may increase the risk of obesity related malignancies.  Lab Results  Component Value Date   HGBA1C 5.3 06/30/2023   Lab Results  Component Value Date   INSULIN 17.5 06/30/2023   INSULIN 35.5 (H) 07/09/2022   Lab Results  Component Value Date   GLUCOSE 93 03/19/2023   GLUCOSE 76 12/14/2014    We reviewed treatment options which include losing 7 to 10% of body weight, increasing volume of physical activity and maintaining a diet low in saturated fats and with a low glycemic load.  Patient has also been  educated on the carb insulin model of obesity.  He has declined pharmacotherapy with metformin and GLP-1.     Class 3 severe obesity with current BMI of 38.8 Assessment & Plan: Overall [name] has lost 59 pounds which is 18% of total body weight on medically supervised weight management plan.  He is somewhat frustrated about discrepancies between scales and the fact that his BMI was just above 40 when he went to see surgeon and therefore was turned down again.  His current BMI is 38.  He will continue working on medically supervised weight management plan.  Obesity is not an absolute contraindication to having knee surgery.  Patient has also worked really hard to get down to this weight.   Bilateral chronic knee pain Assessment & Plan: Patient's quality of life is affected by his knee pain and impaired mobility this is also affecting his weight loss progress.  He has lost a considerable amount of weight to improve candidacy for surgery and has a BMI is currently at 38 hopefully this time around he will be able to have surgery.  He will be meeting with surgeon soon   MDD (major depressive disorder), recurrent episode, mild (HCC) Assessment & Plan: Improving.  Currently on Ritalin, Wellbutrin and trazodone.  Patient advised to  monitor for symptoms of worsening angina.  At present time blood pressure and heart rate are well-controlled.  I also recommend increasing physical activity and daylight exposure.  His mood would improve if he was able to have knee surgery as this is affecting his quality of life.           Objective   Physical Exam:  Blood pressure 104/62, pulse 62, temperature 97.9 F (36.6 C), height 5\' 10"  (1.778 m), weight 271 lb (122.9 kg), SpO2 95%. Body mass index is 38.88 kg/m.  General: He is overweight, cooperative, alert, well developed, and in no acute distress. PSYCH: Has normal mood, affect and thought process.   HEENT: EOMI, sclerae are anicteric. Lungs:  Normal breathing effort, no conversational dyspnea. Extremities: No edema.  Neurologic: No gross sensory or motor deficits. No tremors or fasciculations noted.    Diagnostic Data Reviewed:  BMET    Component Value Date/Time   NA 139 03/19/2023 0437   NA 143 10/08/2022 1226   K 3.8 03/19/2023 0437   CL 108 03/19/2023 0437   CO2 27 03/19/2023 0437   GLUCOSE 93 03/19/2023 0437   BUN 21 03/19/2023 0437   BUN 22 10/08/2022 1226   CREATININE 0.90 03/19/2023 0437   CALCIUM 8.3 (L) 03/19/2023 0437   GFRNONAA >60 03/19/2023 0437   GFRAA >60 02/10/2020 0937   Lab Results  Component Value Date   HGBA1C 5.3 06/30/2023   HGBA1C 5.4 07/09/2022   Lab Results  Component Value Date   INSULIN 17.5 06/30/2023   INSULIN 35.5 (H) 07/09/2022   Lab Results  Component Value Date   TSH 2.41 03/06/2023   CBC    Component Value Date/Time   WBC 9.8 03/19/2023 0437   RBC 4.87 03/19/2023 0437   HGB 14.5 10/30/2023 0850   HCT 43.9 10/30/2023 0850   PLT 217 03/19/2023 0437   PLT 239 11/23/2021 1024   MCV 86.4 03/19/2023 0437   MCV 87 11/23/2021 1024   MCH 28.3 03/19/2023 0437   MCHC 32.8 03/19/2023 0437   RDW 13.2 03/19/2023 0437   RDW 13.4 11/23/2021 1024   Iron Studies No results found for: "IRON", "TIBC", "FERRITIN", "IRONPCTSAT" Lipid Panel     Component Value Date/Time   CHOL 60 03/18/2023 0635   CHOL 82 (L) 07/09/2022 0913   TRIG 56 03/18/2023 0635   HDL 20 (L) 03/18/2023 0635   HDL 28 (L) 07/09/2022 0913   CHOLHDL 3.0 03/18/2023 0635   VLDL 11 03/18/2023 0635   LDLCALC 29 03/18/2023 0635   LDLCALC 33 07/09/2022 0913   Hepatic Function Panel     Component Value Date/Time   PROT 7.0 03/06/2023 1552   PROT 6.7 10/08/2022 1226   ALBUMIN 4.2 03/06/2023 1552   ALBUMIN 4.1 10/08/2022 1226   AST 14 03/06/2023 1552   ALT 16 03/06/2023 1552   ALKPHOS 82 03/06/2023 1552   BILITOT 0.4 03/06/2023 1552   BILITOT 0.3 10/08/2022 1226   BILIDIR <0.10 02/13/2021 0830       Component Value Date/Time   TSH 2.41 03/06/2023 1552   Nutritional Lab Results  Component Value Date   VD25OH 61.4 10/08/2022   VD25OH 18.2 (L) 07/09/2022    Medications: Outpatient Encounter Medications as of 11/04/2023  Medication Sig   amLODipine (NORVASC) 5 MG tablet Take 1 tablet (5 mg total) by mouth daily.   aspirin EC 81 MG tablet Take 1 tablet (81 mg total) by mouth daily.   atorvastatin (LIPITOR) 40 MG tablet  Take 1 tablet (40 mg total) by mouth daily.   buPROPion (WELLBUTRIN XL) 300 MG 24 hr tablet Take 1 tablet (300 mg total) by mouth every morning.   Cephalexin 500 MG tablet Take 500 mg by mouth 3 (three) times daily.   Cholecalciferol (VITAMIN D3) 50 MCG (2000 UT) capsule Take 1 capsule (2,000 Units total) by mouth daily.   cyanocobalamin (VITAMIN B12) 1000 MCG tablet Take 1 tablet (1,000 mcg total) by mouth daily.   Diclofenac Sodium (VOLTAREN EX) Apply topically.   DULoxetine (CYMBALTA) 60 MG capsule TAKE 1 CAPSULE EVERY       MORNING   erythromycin ophthalmic ointment SMARTSIG:1 sparingly Topical 3 Times Daily   furosemide (LASIX) 20 MG tablet Take 1 tablet (20mg ) by mouth daily AND take 1 additional tablet (20mg ) by mouth as needed for excess fluid).   gabapentin (NEURONTIN) 600 MG tablet Take 2 tablets (1,200 mg total) by mouth at bedtime.   levothyroxine (SYNTHROID) 88 MCG tablet Take 1 tablet (88 mcg total) by mouth daily before breakfast.   methylphenidate (RITALIN) 10 MG tablet Take 1 tablet (10 mg total) by mouth 2 (two) times daily with breakfast and lunch.   metoprolol succinate (TOPROL-XL) 25 MG 24 hr tablet TAKE 1 TABLET DAILY   mometasone (NASONEX) 50 MCG/ACT nasal spray Place 2 sprays into the nose daily as needed (allergies).   Multiple Vitamins-Minerals (MULTI FOR HIM 50+) TABS Take 1 tablet by mouth 4 (four) times a week.   testosterone cypionate (DEPOTESTOSTERONE CYPIONATE) 200 MG/ML injection Inject 1 mL (200 mg total) into the muscle every 14  (fourteen) days.   traZODone (DESYREL) 100 MG tablet Take 1-2 tablets (100-200 mg total) by mouth at bedtime.   isosorbide mononitrate (IMDUR) 60 MG 24 hr tablet Take 1 tablet (60 mg total) by mouth 2 (two) times daily.   nitroGLYCERIN (NITROSTAT) 0.3 MG SL tablet Place 1 tablet (0.3 mg total) under the tongue every 5 (five) minutes as needed for chest pain. Max of 3 doses within 15 minutes   No facility-administered encounter medications on file as of 11/04/2023.     Follow-Up   Return in about 4 weeks (around 12/02/2023) for For Weight Mangement with Dr. Rikki Spearing.Marland Kitchen He was informed of the importance of frequent follow up visits to maximize his success with intensive lifestyle modifications for his multiple health conditions.  Attestation Statement   Reviewed by clinician on day of visit: allergies, medications, problem list, medical history, surgical history, family history, social history, and previous encounter notes.     Worthy Rancher, MD

## 2023-11-04 NOTE — Assessment & Plan Note (Signed)
 Overall [name] has lost 59 pounds which is 18% of total body weight on medically supervised weight management plan.  He is somewhat frustrated about discrepancies between scales and the fact that his BMI was just above 40 when he went to see surgeon and therefore was turned down again.  His current BMI is 38.  He will continue working on medically supervised weight management plan.  Obesity is not an absolute contraindication to having knee surgery.  Patient has also worked really hard to get down to this weight.

## 2023-11-04 NOTE — Assessment & Plan Note (Signed)
 Blood pressure at goal for age and risk category.  On amlodipine 5 mg a day, metoprolol, isosorbide without adverse effects.  He is on Ritalin and Wellbutrin for depression and at present time heart rate and blood pressure well-controlled.  Continue with weight loss therapy.  Monitor for symptoms of orthostasis while losing weight. Continue current regimen and home monitoring for a goal blood pressure of 120/80.

## 2023-11-04 NOTE — Assessment & Plan Note (Signed)
 Patient's quality of life is affected by his knee pain and impaired mobility this is also affecting his weight loss progress.  He has lost a considerable amount of weight to improve candidacy for surgery and has a BMI is currently at 38 hopefully this time around he will be able to have surgery.  He will be meeting with surgeon soon

## 2023-11-05 ENCOUNTER — Other Ambulatory Visit (HOSPITAL_COMMUNITY): Payer: Self-pay

## 2023-11-05 ENCOUNTER — Ambulatory Visit: Admitting: Urology

## 2023-11-06 ENCOUNTER — Other Ambulatory Visit (HOSPITAL_COMMUNITY): Payer: Self-pay

## 2023-11-06 DIAGNOSIS — L03213 Periorbital cellulitis: Secondary | ICD-10-CM | POA: Diagnosis not present

## 2023-11-06 NOTE — Telephone Encounter (Signed)
 Called [employer].   T was denied. Need clinical info.  Started an appeal. Answered clinical questions. Faxed over last OV notes with labs.   [phone] Request ID- [id number] Delivered 3/13 1210pm   Per [facility] decision should be made by 3/20.

## 2023-11-10 ENCOUNTER — Encounter: Payer: Self-pay | Admitting: Orthopaedic Surgery

## 2023-11-10 ENCOUNTER — Ambulatory Visit: Payer: HMO | Admitting: Orthopaedic Surgery

## 2023-11-10 VITALS — Ht 70.0 in | Wt 277.0 lb

## 2023-11-10 DIAGNOSIS — G8929 Other chronic pain: Secondary | ICD-10-CM | POA: Diagnosis not present

## 2023-11-10 DIAGNOSIS — M25562 Pain in left knee: Secondary | ICD-10-CM | POA: Diagnosis not present

## 2023-11-10 DIAGNOSIS — M1712 Unilateral primary osteoarthritis, left knee: Secondary | ICD-10-CM

## 2023-11-10 NOTE — Progress Notes (Signed)
 The patient is very well-known to me.  He is 68 years old and has debilitating arthritis involving his left knee.  He has been on a weight loss journey to get his BMI below 40.  He is in need of knee replacement surgery.  He has tried and failed all forms conservative treatment for well over a year as a relates to his left knee.  At this point his left knee pain is daily and it is detrimentally affecting his mobility, his quality of life and his actives daily living.  We have talked in length in detail in the past about knee replacement surgery.  His BMI today is down to 39.75.  Examination of his left knee shows varus malalignment that is correctable.  There is good range of motion of his left knee with patellofemoral crepitation.  Previous x-rays of his left knee show varus malalignment with bone-on-bone wear of the medial compartment and patellofemoral joint.  We had a long thorough discussion about knee replacement surgery.  We discussed what to expect from an intraoperative and postoperative standpoint.  We discussed the risks and benefits of surgery as well.  I reviewed all of his past medical history and medications within epic as well.  We will work on getting him scheduled in the near future for a left total hip arthroplasty.  All questions and concerns were addressed and answered.

## 2023-11-11 ENCOUNTER — Ambulatory Visit: Payer: HMO | Admitting: Clinical

## 2023-11-11 DIAGNOSIS — F331 Major depressive disorder, recurrent, moderate: Secondary | ICD-10-CM

## 2023-11-11 NOTE — Progress Notes (Addendum)
 [facility] Counselor/Therapist Progress Note  Patient ID: [name], MRN: [id number]    Date: 11/11/23  Time Spent: 8:35  am - 9:26 am : 51 Minutes  Treatment Type: Individual Therapy.  Reported Symptoms: Patient reported a recent increase in concentration and improvement in mood  Mental Status Exam: Appearance:  Neat and Well Groomed     Behavior: Appropriate  Motor: Normal  Speech/Language:  Clear and Coherent and Normal Rate  Affect: Appropriate  Mood: normal  Thought process: normal  Thought content:   WNL  Sensory/Perceptual disturbances:   WNL  Orientation: oriented to person, place, and situation  Attention: Good  Concentration: Good  Memory: WNL  Fund of knowledge:  Good  Insight:   Good  Judgment:  Good  Impulse Control: Good   Risk Assessment: Danger to Self:  No Patient denied current suicidal ideation  Self-injurious Behavior: No Danger to Others: No Patient denied current homicidal ideation Duty to Warn:no Physical Aggression / Violence:No  Access to Firearms a concern: No  Gang Involvement:No   Subjective:  Patient stated, "two major things have happened" in response to events since last session. Patient reported patient had a weight removed from his left eye and reported he was informed patient's physician is going to schedule patient's knee surgery. Patient stated, "It has put me on a depression high". Patient stated, "I have hit a peak not a valley of happiness, it would not take much to trigger it to slope". Patient reported patient's PCP has prescribed adderall for decreased energy and patient reported an increase in energy since taking adderall. Patient reported a history of suicidal ideation at age [age]. Patient reported patient's religious beliefs, the impact of suicide on patient's family, and patient's wife are protective factors for patient. Patient reported a family history of suicide on patient's maternal side of the family.  Patient stated, "I value life". Patient stated, "I would like to continue with therapy sessions". Patient stated, "I don't think right now I need to discuss more medication". Patient reported patient's father had a history anxiety. Patient reported a recent increase in self confidence.   Interventions: Motivational Interviewing and psycho education . Clinician conducted session in person at clinician's office at [facility]. Reviewed events since last session and assessed for changes. Clinician clarified patient's report of experiencing  a "depression high". Discussed patient's protective factors. Clinician reviewed diagnosis and treatment recommendations. Provided psycho education related to diagnosis and treatment.   Collaboration of Care: Primary Care Provider AEB Discussed a consent for patient's PCP, Dr. Tillman Abide, to coordinate care  Patient/Guardian was advised Release of Information must be obtained prior to any record release in order to collaborate their care with an outside provider.   Diagnosis:  Major depressive disorder, recurrent episode, moderate (HCC)   Plan: Goals to be developed during follow up appointment on 11/26/23.        Doree Barthel, LCSW

## 2023-11-13 ENCOUNTER — Ambulatory Visit: Admitting: Urology

## 2023-11-17 NOTE — Progress Notes (Unsigned)
 [name] is a 68 y.o.  male with hypogonadism who presents today for instruction on delivering an IM injection of testosterone cypionate.    I instructed the patient to identify the concentration of his testosterone. Testosterone for injection is usually in the form of testosterone cypionate. These liquids come in multiple concentrations, so before giving an injection, it's very important to make sure that his intended dosage takes into account the concentration of the testosterone serum. Usually, testosterone comes in a concentration of either 100 mg/ml or 200 mg/ml.  We typically use the 200 mg/mL in this office.    Using a sterile, 18 G needle and 3 cc syringe, the testosterone cypionate was drawn up for a 1 cc injection.  To draw up the dose, I demonstrated how to first draw air into the syringe equal to the volume of the dosage. Then, wipe the top of the medication bottle with an alcohol wipe, insert the needle through the lid and into the medication, and push the air from your syringe into the bottle. Turn the bottle upside down and draw out the exact dosage of testosterone.  I demonstrated how to aspirate the syringe by hinge the syringe with its needle uncapped and pointing up in front of him.  Looking for air bubbles in the syringe. Flick the side of the syringe to get these bubbles to rise to the top.    When the dosage is bubble-free, I slowly depressed the plunger to force the air at the top of the syringe out stopping when a tiny drop of medication comes out of the tip of the syringe.  I advised him to be certain no air remained in the syringe as injecting air is very dangerous.  Being careful not to squirt or spray a significant portion of the dosage onto the floor.  Preparing the injection site, outer middle third of the vastus lateralis muscle of the thigh, I took a sterile alcohol pad and wipe the immediate area around where I intended him to inject.   I then demonstrated how  to change the needle from the 18 G to the 21 G needle.  I then gave him the syringe for injecting.  He correctly identified the injection location.  He held the syringe like a dart at a 90-degree angle above the sterile injection site. Quickly plunged it into the flesh. Before depressing the plunger, he drew back on it slightly and no blood was seen.  I advised him that if blood flashed in the syringe, he would need to remove the needle and then select a different location as he was in the vein.  Inject the medication at a steady, controlled pace.  He fully depressed the plunger, slowly pull the needle out. Pressing around the injection site with a sterile cotton swab as he did so - this preventing the emerging needle from pulling on the skin and causing extra pain.  We assessed the needle entry point for bleeding, and applied a sterile Band-Aid and/or cotton swab if needed. Disposed of the used needle and syringe in a proper sharps container.  I advised him to acquire a sharps container for his personal use at home.  I advised him that If, after injection, he experienced redness, swelling, or discomfort beyond that of normal soreness at the site of injection, call our office for an appointment and instructions.  He is to always store his medication at the recommended temperature, and always check the expiration date on  the bottle. If it's expired, don't use it.  Of course, keep all of med's out of reach of children.  Do not change his dose without consulting your provider.  His starting dose will be 1 cc every 2 weeks.  He will return 1 week after his fourth injection for a testosterone level, hemoglobin and hematocrit  Kiva Norland, PA-C  I spent 15 minutes on the day of the encounter to include pre-visit record review, face-to-face time with the patient, and post-visit ordering of tests.

## 2023-11-18 ENCOUNTER — Ambulatory Visit (INDEPENDENT_AMBULATORY_CARE_PROVIDER_SITE_OTHER): Admitting: Urology

## 2023-11-18 DIAGNOSIS — E291 Testicular hypofunction: Secondary | ICD-10-CM

## 2023-11-18 MED ORDER — BD DISP NEEDLES 21G X 1-1/2" MISC: 1.0000 mg | 0 refills | Status: DC

## 2023-11-18 MED ORDER — BD DISP NEEDLES 18G X 1-1/2" MISC: 1.0000 mg | 0 refills | Status: DC

## 2023-11-18 MED ORDER — SYRINGE 2-3 ML 3 ML MISC: 1.0000 mg | 3 refills | Status: DC

## 2023-11-24 ENCOUNTER — Encounter: Payer: Self-pay | Admitting: Orthopaedic Surgery

## 2023-11-26 ENCOUNTER — Encounter: Payer: Self-pay | Admitting: Clinical

## 2023-11-26 ENCOUNTER — Ambulatory Visit (INDEPENDENT_AMBULATORY_CARE_PROVIDER_SITE_OTHER): Admitting: Clinical

## 2023-11-26 DIAGNOSIS — F331 Major depressive disorder, recurrent, moderate: Secondary | ICD-10-CM | POA: Diagnosis not present

## 2023-11-26 DIAGNOSIS — F419 Anxiety disorder, unspecified: Secondary | ICD-10-CM

## 2023-11-26 NOTE — Progress Notes (Signed)
 [facility] Counselor/Therapist Progress Note  Patient ID: [name], MRN: [id number]    Date: 11/26/23  Time Spent: 8:36  am - 9:36 am : 60 Minutes  Treatment Type: Individual Therapy.  Reported Symptoms: frustration, anger, feeling fidgety  Mental Status Exam: Appearance:  Neat and Well Groomed     Behavior: Appropriate  Motor: Normal  Speech/Language:  Clear and Coherent and Normal Rate  Affect: Appropriate  Mood: angry and frustrated  Thought process: normal  Thought content:   WNL  Sensory/Perceptual disturbances:   WNL  Orientation: oriented to person, place, and situation  Attention: Good  Concentration: Good  Memory: WNL  Fund of knowledge:  Good  Insight:   Good  Judgment:  Good  Impulse Control: Good   Risk Assessment: Danger to Self:  No Patient denied current suicidal ideation  Self-injurious Behavior: No Danger to Others: No Patient denied current homicidal ideation Duty to Warn:no Physical Aggression / Violence:No  Access to Firearms a concern: No  Gang Involvement:No   Subjective:  Patient stated, "they've been going good" in response to events since last session. Patient reported he is afraid to perform tasks and stated, "I know if I do something stressful I know I can have an angina attack or the next day I may not be able to do anything because of my knees". Patient stated, "I've talked to my wife more about these things, I'm opening up to her more". Patient reported feeling "fidgety" today and reported he has to call tonight to determine if patient has jury duty tomorrow. Patient reported feeling anxious about jury duty. Patient reported patient's physician approved patient for knee surgery and patient has not heard from the practice regarding scheduling the surgery. Patient stated, "I'm not happy", "frustrated and angry" regarding surgery. Patient stated, "this puts my life in limbo" in reference to awaiting surgery. Patient stated, "I  want to continue to discuss what's going on in my life right now (stressors) and the depression, the depression is part of not wanting to get out of the chair". Patient stated, "I use all these excuses in my head not to get out of that chair". Patient reported he has resumed a relationship with his brother and reported he would like to see in growth in the relationship with patient's brother. Patient reported a recent panic attack and reported he experienced shaking, nausea, and stated, "my brain was flooded with thoughts", "thoughts of the things I have failed at doing", "I kept hearing my dad say you can't fix it". Patient reported he would like to continue making healthy lifestyle changes. Patient reported he feels his father's "negative attitude" has contributed to patient's beliefs/thoughts. Patient reported symptoms of anxiety are present 75% of the time and is a barrier to patient performing activities he would like to perform.   Interventions: Motivational Interviewing. Clinician conducted session in person at clinician's office at [facility]. Reviewed events since last session and assessed for changes. Provided psycho education related symptoms of depression and anxiety. Clinician utilized motivational interviewing to explore potential goals for therapy. Clinician utilized a task centered approach in collaboration with patient to develop goals for therapy. Patient participated in development of goals and agreed to goals for therapy.    Collaboration of Care: Other not required at this time  Diagnosis:  Major depressive disorder, recurrent episode, moderate (HCC)  Anxiety disorder, unspecified type   Plan: Patient is to utilize [employer], thought re-framing, behavioral activation, relaxation techniques, mindfulness and  coping strategies to decrease symptoms associated with their diagnosis. Frequency: weekly  Modality: individual     Long-term goal:   Reduce  overall level, frequency, and intensity of the feelings of depression and anxiety as evidenced by decreased loss of motivation, loss of interest, psychomotor retardation, worry, lack of energy, difficulty concentrating, depressed mood, difficulty falling asleep and returning to sleep, and inactivity from 7 days/week to 0 to 1 days/week per patient report for at least 3 consecutive months. Target Date: 11/25/24  Progress: established 11/26/23   Short-term goal:  Increase patient's participation in physical activities, such as, yard work, walking half way around patient's neighborhood, walking nature trail located near patient's home from 1 time per week to 2 times week Target Date: 05/27/24  Progress: established 11/26/23   Increase patient's participation in activities/projects patient enjoys from 0 times per week to 3-4 times per week  Target Date: 05/27/24  Progress: established 11/26/23   Identify, challenge, and replace negative core beliefs, thought patterns, and negative self talk that contribute to feelings of depression and anxiety with positive thoughts, beliefs, and positive self talk per patient's report Target Date: 05/27/24  Progress: established 11/26/23   Continue to make healthy lifestyle changes, such as, healthy dietary choices and increase physical activity Target Date: 05/27/24  Progress: established 11/26/23                    Doree Barthel, LCSW

## 2023-12-02 ENCOUNTER — Encounter: Payer: Self-pay | Admitting: Internal Medicine

## 2023-12-04 ENCOUNTER — Encounter: Payer: Self-pay | Admitting: Internal Medicine

## 2023-12-05 ENCOUNTER — Other Ambulatory Visit (HOSPITAL_COMMUNITY): Payer: Self-pay

## 2023-12-05 ENCOUNTER — Other Ambulatory Visit: Payer: Self-pay | Admitting: Internal Medicine

## 2023-12-05 ENCOUNTER — Other Ambulatory Visit: Payer: Self-pay

## 2023-12-05 MED ORDER — METHYLPHENIDATE HCL 10 MG PO TABS
10.0000 mg | ORAL_TABLET | Freq: Two times a day (BID) | ORAL | 0 refills | Status: DC
  Filled 2023-12-05: qty 60, 30d supply, fill #0

## 2023-12-05 NOTE — Telephone Encounter (Signed)
 Last written 10-27-23 #60 Last OV 10-27-23 Next OV 03-25-24 [facility]

## 2023-12-08 ENCOUNTER — Encounter (INDEPENDENT_AMBULATORY_CARE_PROVIDER_SITE_OTHER): Payer: Self-pay | Admitting: Internal Medicine

## 2023-12-08 ENCOUNTER — Ambulatory Visit (INDEPENDENT_AMBULATORY_CARE_PROVIDER_SITE_OTHER): Admitting: Internal Medicine

## 2023-12-08 VITALS — BP 117/76 | HR 62 | Temp 98.1°F | Ht 70.0 in | Wt 274.0 lb

## 2023-12-08 DIAGNOSIS — Z6839 Body mass index (BMI) 39.0-39.9, adult: Secondary | ICD-10-CM | POA: Diagnosis not present

## 2023-12-08 DIAGNOSIS — I110 Hypertensive heart disease with heart failure: Secondary | ICD-10-CM | POA: Diagnosis not present

## 2023-12-08 DIAGNOSIS — I5032 Chronic diastolic (congestive) heart failure: Secondary | ICD-10-CM

## 2023-12-08 DIAGNOSIS — G4733 Obstructive sleep apnea (adult) (pediatric): Secondary | ICD-10-CM

## 2023-12-08 DIAGNOSIS — M1711 Unilateral primary osteoarthritis, right knee: Secondary | ICD-10-CM

## 2023-12-08 DIAGNOSIS — E669 Obesity, unspecified: Secondary | ICD-10-CM | POA: Diagnosis not present

## 2023-12-08 DIAGNOSIS — E88819 Insulin resistance, unspecified: Secondary | ICD-10-CM

## 2023-12-08 DIAGNOSIS — I25118 Atherosclerotic heart disease of native coronary artery with other forms of angina pectoris: Secondary | ICD-10-CM

## 2023-12-08 DIAGNOSIS — I10 Essential (primary) hypertension: Secondary | ICD-10-CM

## 2023-12-08 NOTE — Progress Notes (Unsigned)
 Office: [phone]  /  Fax: [phone]  Weight Summary And Biometrics  Vitals Temp: 98.1 F (36.7 C) BP: 117/76 Pulse Rate: 62 SpO2: 96 %   Anthropometric Measurements Height: 5\' 10"  (1.778 m) Weight: 274 lb (124.3 kg) BMI (Calculated): 39.32 Weight at Last Visit: 271 lb Weight Lost Since Last Visit: 0 lb Weight Gained Since Last Visit: 3 lb Starting Weight: 320 lb Total Weight Loss (lbs): 46 lb (20.9 kg) Peak Weight: 382 lb   Body Composition  Body Fat %: 39.9 % Fat Mass (lbs): 109.4 lbs Muscle Mass (lbs): 156.6 lbs Total Body Water (lbs): 115.2 lbs Visceral Fat Rating : 26    RMR: 1656  Today's Visit #: 21  Starting Date: 07/10/23   Subjective   Chief Complaint: Obesity  Interval History Discussed the use of AI scribe software for clinical note transcription with the patient, who gave verbal consent to proceed.  History of Present Illness   [name] is a 68 year old male with obesity who presents for medical weight management.  He has gained three pounds since his last visit despite adhering to a 1500 calorie diet approximately 90-100% of the time. He tracks his calories, consumes more whole foods, meets the recommended protein intake, maintains adequate hydration, and occasionally skips meals. He is not currently exercising due to limitations from his left knee condition.  His obesity is associated with hypertension, sleep apnea, and insulin resistance. He uses a CPAP machine nightly for sleep apnea. His metabolic rate was previously measured at 1656 calories, and he is currently setting his calorie intake at 1500 calories. His BMI has increased to 39.32.  He is awaiting knee surgery and is currently advised against exercises involving the left knee due to bone-on-bone contact. This limits his ability to exercise, contributing to weight management challenges. He is concerned about maintaining his weight for the upcoming surgery.  He is  frustrated with his current weight management efforts, noting that despite maintaining a calorie deficit, his body is not responding as expected. He has lost a total of 46 pounds and has been able to maintain this weight loss. He is hopeful that after his knee surgery, he will be able to increase his physical activity, which he believes will aid in further weight loss.  He is taking Lasix (furosemide) 20 mg once daily for fluid retention related to heart failure.       Challenges affecting patient progress: none, low volume of physical activity at present , orthopedic problems, medical conditions or chronic pain affecting mobility, medical comorbidities, and slow metabolism for age.    Pharmacotherapy for weight management: He is currently taking no anti-obesity medication and patient has declined pharmacotherapy in past.   Assessment and Plan   Treatment Plan For Obesity:  Recommended Dietary Goals  Lazar is currently in the action stage of change. As such, his goal is to continue weight management plan. He has agreed to: follow the Category 3 plan - 1500 kcal per day  Behavioral Health and Counseling  We discussed the following behavioral modification strategies today: continue to work on maintaining a reduced calorie state, getting the recommended amount of protein, incorporating whole foods, making healthy choices, staying well hydrated and practicing mindfulness when eating..  Additional education and resources provided today: None  Recommended Physical Activity Goals  Abrar has been advised to work up to 150 minutes of moderate intensity aerobic activity a week and strengthening exercises 2-3 times per week for cardiovascular health, weight loss  maintenance and preservation of muscle mass.   He has agreed to :  Think about enjoyable ways to increase daily physical activity and overcoming barriers to exercise and Increase physical activity in their day and reduce sedentary time  (increase NEAT).  Pharmacotherapy  We discussed various medication options to help Ida with his weight loss efforts and we both agreed to : has declined pharmacotherapy  Associated Conditions Impacted by Obesity Treatment  OSA on CPAP  Chronic heart failure with preserved ejection fraction (HFpEF) (HCC)  Essential hypertension  Insulin resistance  Coronary artery disease of native artery of native heart with stable angina pectoris (HCC)  Unilateral primary osteoarthritis, right knee     Assessment and Plan    Obesity   He is undergoing medical weight management but has gained 3 pounds despite a 1500 calorie diet and tracking intake. His BMI is 39.32. Since November, he has lost 46 pounds but struggles to lose more due to lack of exercise from knee issues.  We reviewed benefits of GLP-1 therapy from a weight management and cardiovascular standpoint.  He prefers to wait until after knee surgery to reassess progress before considering medication. Continue the 1500 calorie diet with 90-120 grams of protein daily. Consider medication if weight does not decrease post-surgery. Encourage exercise post-surgery to aid weight loss.  Knee Osteoarthritis   He is awaiting knee surgery and cannot exercise due to pain. Post-surgery, increased mobility is expected to aid weight management. He plans to increase physical activity post-surgery to facilitate weight loss. Proceed with knee surgery as planned.  Heart Failure   He takes furosemide 20 mg daily for fluid retention. Increase furosemide to twice daily for the next few days to manage fluid retention.  BIA information suggest some water retention.    Hypertension   Blood pressure is well-controlled.  Currently on metoprolol amlodipine and isosorbide for angina.  Maintain a low-sodium diet to manage fluid retention associated with heart failure. Sodium intake affects water retention, crucial for managing hypertension and heart  failure.  Insulin resistance This has improved with weight loss and dietary changes.  He is currently on no pharmacotherapy for pharmacoprophylaxis.  Continue with current weight management strategy  Sleep Apnea   He has moderate sleep apnea and uses CPAP nightly. Weight loss could improve sleep apnea severity. Weight loss medications are approved for moderate to severe sleep apnea and could be beneficial.  Goals of Care   He aims to improve mobility post-knee surgery to enhance weight loss efforts. Prefers to delay weight loss medication until after assessing post-surgery weight management. He focuses on regaining activity levels to aid in weight management and overall health improvement.  Follow-up   He has a pre-operative appointment on Wednesday. Surgery is contingent on weight being below a certain threshold. He is concerned about potential surgery cancellation if weight exceeds the limit. Attend the pre-operative appointment on Wednesday.         Objective   Physical Exam:  Blood pressure 117/76, pulse 62, temperature 98.1 F (36.7 C), height 5\' 10"  (1.778 m), weight 274 lb (124.3 kg), SpO2 96%. Body mass index is 39.31 kg/m.  General: He is overweight, cooperative, alert, well developed, and in no acute distress. PSYCH: Has normal mood, affect and thought process.   HEENT: EOMI, sclerae are anicteric. Lungs: Normal breathing effort, no conversational dyspnea. Extremities: No edema.  Neurologic: No gross sensory or motor deficits. No tremors or fasciculations noted.    Diagnostic Data Reviewed:  BMET  Component Value Date/Time   NA 139 03/19/2023 0437   NA 143 10/08/2022 1226   K 3.8 03/19/2023 0437   CL 108 03/19/2023 0437   CO2 27 03/19/2023 0437   GLUCOSE 93 03/19/2023 0437   BUN 21 03/19/2023 0437   BUN 22 10/08/2022 1226   CREATININE 0.90 03/19/2023 0437   CALCIUM 8.3 (L) 03/19/2023 0437   GFRNONAA >60 03/19/2023 0437   GFRAA >60 02/10/2020 0937   Lab  Results  Component Value Date   HGBA1C 5.3 06/30/2023   HGBA1C 5.4 07/09/2022   Lab Results  Component Value Date   INSULIN 17.5 06/30/2023   INSULIN 35.5 (H) 07/09/2022   Lab Results  Component Value Date   TSH 2.41 03/06/2023   CBC    Component Value Date/Time   WBC 9.8 03/19/2023 0437   RBC 4.87 03/19/2023 0437   HGB 14.5 10/30/2023 0850   HCT 43.9 10/30/2023 0850   PLT 217 03/19/2023 0437   PLT 239 11/23/2021 1024   MCV 86.4 03/19/2023 0437   MCV 87 11/23/2021 1024   MCH 28.3 03/19/2023 0437   MCHC 32.8 03/19/2023 0437   RDW 13.2 03/19/2023 0437   RDW 13.4 11/23/2021 1024   Iron Studies No results found for: "IRON", "TIBC", "FERRITIN", "IRONPCTSAT" Lipid Panel     Component Value Date/Time   CHOL 60 03/18/2023 0635   CHOL 82 (L) 07/09/2022 0913   TRIG 56 03/18/2023 0635   HDL 20 (L) 03/18/2023 0635   HDL 28 (L) 07/09/2022 0913   CHOLHDL 3.0 03/18/2023 0635   VLDL 11 03/18/2023 0635   LDLCALC 29 03/18/2023 0635   LDLCALC 33 07/09/2022 0913   Hepatic Function Panel     Component Value Date/Time   PROT 7.0 03/06/2023 1552   PROT 6.7 10/08/2022 1226   ALBUMIN 4.2 03/06/2023 1552   ALBUMIN 4.1 10/08/2022 1226   AST 14 03/06/2023 1552   ALT 16 03/06/2023 1552   ALKPHOS 82 03/06/2023 1552   BILITOT 0.4 03/06/2023 1552   BILITOT 0.3 10/08/2022 1226   BILIDIR <0.10 02/13/2021 0830      Component Value Date/Time   TSH 2.41 03/06/2023 1552   Nutritional Lab Results  Component Value Date   VD25OH 61.4 10/08/2022   VD25OH 18.2 (L) 07/09/2022    Medications: Outpatient Encounter Medications as of 12/08/2023  Medication Sig   amLODipine (NORVASC) 5 MG tablet Take 1 tablet (5 mg total) by mouth daily.   aspirin EC 81 MG tablet Take 1 tablet (81 mg total) by mouth daily.   atorvastatin (LIPITOR) 40 MG tablet Take 1 tablet (40 mg total) by mouth daily.   buPROPion (WELLBUTRIN XL) 300 MG 24 hr tablet Take 1 tablet (300 mg total) by mouth every morning.    Cholecalciferol (VITAMIN D3) 50 MCG (2000 UT) capsule Take 1 capsule (2,000 Units total) by mouth daily.   cyanocobalamin (VITAMIN B12) 1000 MCG tablet Take 1 tablet (1,000 mcg total) by mouth daily.   diclofenac Sodium (VOLTAREN) 1 % GEL Apply 2 g topically daily as needed (pain).   DULoxetine (CYMBALTA) 60 MG capsule TAKE 1 CAPSULE EVERY       MORNING   furosemide (LASIX) 20 MG tablet Take 1 tablet (20mg ) by mouth daily AND take 1 additional tablet (20mg ) by mouth as needed for excess fluid).   gabapentin (NEURONTIN) 600 MG tablet Take 2 tablets (1,200 mg total) by mouth at bedtime.   levothyroxine (SYNTHROID) 88 MCG tablet Take 1 tablet (88 mcg  total) by mouth daily before breakfast.   methylphenidate (RITALIN) 10 MG tablet Take 1 tablet (10 mg total) by mouth 2 (two) times daily with breakfast and lunch.   metoprolol succinate (TOPROL-XL) 25 MG 24 hr tablet TAKE 1 TABLET DAILY   mometasone (NASONEX) 50 MCG/ACT nasal spray Place 2 sprays into the nose daily as needed (allergies).   Multiple Vitamins-Minerals (MULTI FOR HIM 50+) TABS Take 1 tablet by mouth 4 (four) times a week.   NEEDLE, DISP, 18 G (BD DISP NEEDLES) 18G X 1-1/2" MISC 1 mg by Does not apply route every 14 (fourteen) days.   NEEDLE, DISP, 21 G (BD DISP NEEDLES) 21G X 1-1/2" MISC 1 mg by Does not apply route every 14 (fourteen) days.   NON FORMULARY Pt uses c-pap nightly   Syringe, Disposable, (2-3CC SYRINGE) 3 ML MISC 1 mg by Does not apply route every 14 (fourteen) days.   testosterone cypionate (DEPOTESTOSTERONE CYPIONATE) 200 MG/ML injection Inject 1 mL (200 mg total) into the muscle every 14 (fourteen) days.   traZODone (DESYREL) 100 MG tablet Take 1-2 tablets (100-200 mg total) by mouth at bedtime.   isosorbide mononitrate (IMDUR) 60 MG 24 hr tablet Take 1 tablet (60 mg total) by mouth 2 (two) times daily.   nitroGLYCERIN (NITROSTAT) 0.3 MG SL tablet Place 1 tablet (0.3 mg total) under the tongue every 5 (five) minutes as  needed for chest pain. Max of 3 doses within 15 minutes   No facility-administered encounter medications on file as of 12/08/2023.     Follow-Up   No follow-ups on file.Aaron Aas He was informed of the importance of frequent follow up visits to maximize his success with intensive lifestyle modifications for his multiple health conditions.  Attestation Statement   Reviewed by clinician on day of visit: allergies, medications, problem list, medical history, surgical history, family history, social history, and previous encounter notes.     Ladd Picker, MD

## 2023-12-09 NOTE — Patient Instructions (Signed)
 DUE TO COVID-19 ONLY TWO VISITORS  (aged 68 and older)  ARE ALLOWED TO COME WITH YOU AND STAY IN THE WAITING ROOM ONLY DURING PRE OP AND PROCEDURE.   **NO VISITORS ARE ALLOWED IN THE SHORT STAY AREA OR RECOVERY ROOM!!**  IF YOU WILL BE ADMITTED INTO THE HOSPITAL YOU ARE ALLOWED ONLY FOUR SUPPORT PEOPLE DURING VISITATION HOURS ONLY (7 AM -8PM)   The support person(s) must pass our screening, gel in and out, and wear a mask at all times, including in the patient's room. Patients must also wear a mask when staff or their support person are in the room. Visitors GUEST BADGE MUST BE WORN VISIBLY  One adult visitor may remain with you overnight and MUST be in the room by 8 P.M.     Your procedure is scheduled on: 12/19/23   Report to [facility] Main Entrance    Report to admitting at : 8:45 AM   Call this number if you have problems the morning of surgery [phone]   Do not eat food :After Midnight.   After Midnight you may have the following liquids until : 8:15 AM DAY OF SURGERY  Water Black Coffee (sugar ok, NO MILK/CREAM OR CREAMERS)  Tea (sugar ok, NO MILK/CREAM OR CREAMERS) regular and decaf                             Plain Jell-O (NO RED)                                           Fruit ices (not with fruit pulp, NO RED)                                     Popsicles (NO RED)                                                                  Juice: apple, WHITE grape, WHITE cranberry Sports drinks like Gatorade (NO RED)   The day of surgery:  Drink ONE (1) Pre-Surgery Clear Ensure at : 8:15 AM the morning of surgery. Drink in one sitting. Do not sip.  This drink was given to you during your hospital  pre-op appointment visit. Nothing else to drink after completing the  Pre-Surgery Clear Ensure or G2.          If you have questions, please contact your surgeon's office.  FOLLOW  ANY ADDITIONAL PRE OP INSTRUCTIONS YOU RECEIVED FROM YOUR SURGEON'S OFFICE!!!   Oral  Hygiene is also important to reduce your risk of infection.                                    Remember - BRUSH YOUR TEETH THE MORNING OF SURGERY WITH YOUR REGULAR TOOTHPASTE  DENTURES WILL BE REMOVED PRIOR TO SURGERY PLEASE DO NOT APPLY "Poly grip" OR ADHESIVES!!!   Do NOT smoke after Midnight   Take these medicines the morning of  surgery with A SIP OF WATER: isosorbide,bupropion,duloxetine,metoprolol,amlodipine,levothyroxine,ritalin.  DO NOT TAKE ANY ORAL DIABETIC MEDICATIONS DAY OF YOUR SURGERY  Bring CPAP mask and tubing day of surgery.                              You may not have any metal on your body including hair pins, jewelry, and body piercing             Do not wear lotions, powders, perfumes/cologne, or deodorant              Men may shave face and neck.   Do not bring valuables to the hospital. [facility] IS NOT             RESPONSIBLE   FOR VALUABLES.   Contacts, glasses, or bridgework may not be worn into surgery.   Bring small overnight bag day of surgery.   DO NOT BRING YOUR HOME MEDICATIONS TO THE HOSPITAL. PHARMACY WILL DISPENSE MEDICATIONS LISTED ON YOUR MEDICATION LIST TO YOU DURING YOUR ADMISSION IN THE HOSPITAL!    Patients discharged on the day of surgery will not be allowed to drive home.  Someone NEEDS to stay with you for the first 24 hours after anesthesia.   Special Instructions: Bring a copy of your healthcare power of attorney and living will documents         the day of surgery if you haven't scanned them before.              Please read over the following fact sheets you were given: IF YOU HAVE QUESTIONS ABOUT YOUR PRE-OP INSTRUCTIONS PLEASE CALL [phone]      Pre-operative 5 CHG Bath Instructions   You can play a key role in reducing the risk of infection after surgery. Your skin needs to be as free of germs as possible. You can reduce the number of germs on your skin by washing with CHG (chlorhexidine gluconate) soap before surgery. CHG  is an antiseptic soap that kills germs and continues to kill germs even after washing.   DO NOT use if you have an allergy to chlorhexidine/CHG or antibacterial soaps. If your skin becomes reddened or irritated, stop using the CHG and notify one of our RNs at [phone].   Please shower with the CHG soap starting 4 days before surgery using the following schedule:     Please keep in mind the following:  DO NOT shave, including legs and underarms, starting the day of your first shower.   You may shave your face at any point before/day of surgery.  Place clean sheets on your bed the day you start using CHG soap. Use a clean washcloth (not used since being washed) for each shower. DO NOT sleep with pets once you start using the CHG.   CHG Shower Instructions:  If you choose to wash your hair and private area, wash first with your normal shampoo/soap.  After you use shampoo/soap, rinse your hair and body thoroughly to remove shampoo/soap residue.  Turn the water OFF and apply about 3 tablespoons (45 ml) of CHG soap to a CLEAN washcloth.  Apply CHG soap ONLY FROM YOUR NECK DOWN TO YOUR TOES (washing for 3-5 minutes)  DO NOT use CHG soap on face, private areas, open wounds, or sores.  Pay special attention to the area where your surgery is being performed.  If you are having back surgery, having someone wash your  back for you may be helpful. Wait 2 minutes after CHG soap is applied, then you may rinse off the CHG soap.  Pat dry with a clean towel  Put on clean clothes/pajamas   If you choose to wear lotion, please use ONLY the CHG-compatible lotions on the back of this paper.     Additional instructions for the day of surgery: DO NOT APPLY any lotions, deodorants, cologne, or perfumes.   Put on clean/comfortable clothes.  Brush your teeth.  Ask your nurse before applying any prescription medications to the skin.   CHG Compatible Lotions   Aveeno Moisturizing lotion  Cetaphil  Moisturizing Cream  Cetaphil Moisturizing Lotion  Clairol Herbal Essence Moisturizing Lotion, Dry Skin  Clairol Herbal Essence Moisturizing Lotion, Extra Dry Skin  Clairol Herbal Essence Moisturizing Lotion, Normal Skin  Curel Age Defying Therapeutic Moisturizing Lotion with Alpha Hydroxy  Curel Extreme Care Body Lotion  Curel Soothing Hands Moisturizing Hand Lotion  Curel Therapeutic Moisturizing Cream, Fragrance-Free  Curel Therapeutic Moisturizing Lotion, Fragrance-Free  Curel Therapeutic Moisturizing Lotion, Original Formula  Eucerin Daily Replenishing Lotion  Eucerin Dry Skin Therapy Plus Alpha Hydroxy Crme  Eucerin Dry Skin Therapy Plus Alpha Hydroxy Lotion  Eucerin Original Crme  Eucerin Original Lotion  Eucerin Plus Crme Eucerin Plus Lotion  Eucerin TriLipid Replenishing Lotion  Keri Anti-Bacterial Hand Lotion  Keri Deep Conditioning Original Lotion Dry Skin Formula Softly Scented  Keri Deep Conditioning Original Lotion, Fragrance Free Sensitive Skin Formula  Keri Lotion Fast Absorbing Fragrance Free Sensitive Skin Formula  Keri Lotion Fast Absorbing Softly Scented Dry Skin Formula  Keri Original Lotion  Keri Skin Renewal Lotion Keri Silky Smooth Lotion  Keri Silky Smooth Sensitive Skin Lotion  Nivea Body Creamy Conditioning Oil  Nivea Body Extra Enriched Lotion  Nivea Body Original Lotion  Nivea Body Sheer Moisturizing Lotion Nivea Crme  Nivea Skin Firming Lotion  NutraDerm 30 Skin Lotion  NutraDerm Skin Lotion  NutraDerm Therapeutic Skin Cream  NutraDerm Therapeutic Skin Lotion  ProShield Protective Hand Cream  Provon moisturizing lotion   Incentive Spirometer  An incentive spirometer is a tool that can help keep your lungs clear and active. This tool measures how well you are filling your lungs with each breath. Taking long deep breaths may help reverse or decrease the chance of developing breathing (pulmonary) problems (especially infection) following: A long  period of time when you are unable to move or be active. BEFORE THE PROCEDURE  If the spirometer includes an indicator to show your best effort, your nurse or respiratory therapist will set it to a desired goal. If possible, sit up straight or lean slightly forward. Try not to slouch. Hold the incentive spirometer in an upright position. INSTRUCTIONS FOR USE  Sit on the edge of your bed if possible, or sit up as far as you can in bed or on a chair. Hold the incentive spirometer in an upright position. Breathe out normally. Place the mouthpiece in your mouth and seal your lips tightly around it. Breathe in slowly and as deeply as possible, raising the piston or the ball toward the top of the column. Hold your breath for 3-5 seconds or for as long as possible. Allow the piston or ball to fall to the bottom of the column. Remove the mouthpiece from your mouth and breathe out normally. Rest for a few seconds and repeat Steps 1 through 7 at least 10 times every 1-2 hours when you are awake. Take your time and take a few  normal breaths between deep breaths. The spirometer may include an indicator to show your best effort. Use the indicator as a goal to work toward during each repetition. After each set of 10 deep breaths, practice coughing to be sure your lungs are clear. If you have an incision (the cut made at the time of surgery), support your incision when coughing by placing a pillow or rolled up towels firmly against it. Once you are able to get out of bed, walk around indoors and cough well. You may stop using the incentive spirometer when instructed by your caregiver.  RISKS AND COMPLICATIONS Take your time so you do not get dizzy or light-headed. If you are in pain, you may need to take or ask for pain medication before doing incentive spirometry. It is harder to take a deep breath if you are having pain. AFTER USE Rest and breathe slowly and easily. It can be helpful to keep track of a log  of your progress. Your caregiver can provide you with a simple table to help with this. If you are using the spirometer at home, follow these instructions: SEEK MEDICAL CARE IF:  You are having difficultly using the spirometer. You have trouble using the spirometer as often as instructed. Your pain medication is not giving enough relief while using the spirometer. You develop fever of 100.5 F (38.1 C) or higher. SEEK IMMEDIATE MEDICAL CARE IF:  You cough up bloody sputum that had not been present before. You develop fever of 102 F (38.9 C) or greater. You develop worsening pain at or near the incision site. MAKE SURE YOU:  Understand these instructions. Will watch your condition. Will get help right away if you are not doing well or get worse. Document Released: 12/23/2006 Document Revised: 11/04/2011 Document Reviewed: 02/23/2007 [facility] Patient Information 2014 [place], [place].   ________________________________________________________________________

## 2023-12-10 ENCOUNTER — Other Ambulatory Visit: Payer: Self-pay

## 2023-12-10 ENCOUNTER — Encounter (HOSPITAL_COMMUNITY): Payer: Self-pay

## 2023-12-10 ENCOUNTER — Encounter (HOSPITAL_COMMUNITY)
Admission: RE | Admit: 2023-12-10 | Discharge: 2023-12-10 | Disposition: A | Discharge status: Home / Self Care | Source: Ambulatory Visit | Attending: Orthopaedic Surgery | Admitting: Orthopaedic Surgery

## 2023-12-10 VITALS — BP 111/75 | HR 53 | Temp 98.6°F | Ht 70.0 in

## 2023-12-10 DIAGNOSIS — Z9889 Other specified postprocedural states: Secondary | ICD-10-CM | POA: Insufficient documentation

## 2023-12-10 DIAGNOSIS — M1712 Unilateral primary osteoarthritis, left knee: Secondary | ICD-10-CM | POA: Diagnosis not present

## 2023-12-10 DIAGNOSIS — X088XXS Exposure to other specified smoke, fire and flames, sequela: Secondary | ICD-10-CM | POA: Diagnosis not present

## 2023-12-10 DIAGNOSIS — I2510 Atherosclerotic heart disease of native coronary artery without angina pectoris: Secondary | ICD-10-CM | POA: Diagnosis not present

## 2023-12-10 DIAGNOSIS — Z01818 Encounter for other preprocedural examination: Secondary | ICD-10-CM

## 2023-12-10 DIAGNOSIS — I10 Essential (primary) hypertension: Secondary | ICD-10-CM | POA: Insufficient documentation

## 2023-12-10 DIAGNOSIS — G4730 Sleep apnea, unspecified: Secondary | ICD-10-CM | POA: Diagnosis not present

## 2023-12-10 DIAGNOSIS — T3130 Burns involving 30-39% of body surface with 0% to 9% third degree burns: Secondary | ICD-10-CM | POA: Insufficient documentation

## 2023-12-10 DIAGNOSIS — Z79899 Other long term (current) drug therapy: Secondary | ICD-10-CM | POA: Insufficient documentation

## 2023-12-10 DIAGNOSIS — Z01812 Encounter for preprocedural laboratory examination: Secondary | ICD-10-CM | POA: Diagnosis not present

## 2023-12-10 HISTORY — DX: Unspecified osteoarthritis, unspecified site: M19.90

## 2023-12-10 LAB — CBC
HCT: 46 % (ref 39.0–52.0)
Hemoglobin: 14.4 g/dL (ref 13.0–17.0)
MCH: 28.3 pg (ref 26.0–34.0)
MCHC: 31.3 g/dL (ref 30.0–36.0)
MCV: 90.6 fL (ref 80.0–100.0)
Platelets: 240 10*3/uL (ref 150–400)
RBC: 5.08 MIL/uL (ref 4.22–5.81)
RDW: 13.5 % (ref 11.5–15.5)
WBC: 10.5 10*3/uL (ref 4.0–10.5)
nRBC: 0 % (ref 0.0–0.2)

## 2023-12-10 LAB — BASIC METABOLIC PANEL WITH GFR
Anion gap: 10 (ref 5–15)
BUN: 21 mg/dL (ref 8–23)
CO2: 23 mmol/L (ref 22–32)
Calcium: 8.8 mg/dL — ABNORMAL LOW (ref 8.9–10.3)
Chloride: 105 mmol/L (ref 98–111)
Creatinine, Ser: 0.95 mg/dL (ref 0.61–1.24)
GFR, Estimated: >60 mL/min (ref >60)
Glucose, Bld: 91 mg/dL (ref 70–99)
Potassium: 3.7 mmol/L (ref 3.5–5.1)
Sodium: 138 mmol/L (ref 135–145)

## 2023-12-10 LAB — SURGICAL PCR SCREEN
MRSA, PCR: NEGATIVE
Staphylococcus aureus: NEGATIVE

## 2023-12-10 NOTE — Progress Notes (Addendum)
 For Anesthesia: PCP - Helaine Llanos, MD  Cardiologist - End, Veryl Gottron, MD LOV: 09/17/23 Pulmunologist: Aleck Hurdle, MD Bowel Prep reminder:  Chest x-ray - 03/17/23 EKG - 09/17/23 Stress Test - ECHO - 03/18/23 Cardiac Cath - 02/2023 Pacemaker/ICD device last checked: Pacemaker orders received: Device Rep notified:  Spinal Cord Stimulator:N/A  Sleep Study - Yes CPAP - Yes  Fasting Blood Sugar - N/A Checks Blood Sugar _____ times a day Date and result of last Hgb A1c-  Last dose of GLP1 agonist- N/A GLP1 instructions:   Last dose of SGLT-2 inhibitors- N/A SGLT-2 instructions:   Blood Thinner Instructions: Aspirin Instructions: Last Dose:  Activity level: Can go up a flight of stairs and activities of daily living without stopping and without chest pain and/or shortness of breath   Able to exercise without chest pain and/or shortness of breath  Anesthesia review: Hx: CAD,Chronic HF,Angina,Bells palsy,HTN,OSA(CPAP),SOB.  Patient denies shortness of breath, fever, cough and chest pain at PAT appointment   Patient verbalized understanding of instructions that were given to them at the PAT appointment. Patient was also instructed that they will need to review over the PAT instructions again at home before surgery.

## 2023-12-11 ENCOUNTER — Other Ambulatory Visit (HOSPITAL_COMMUNITY): Payer: Self-pay

## 2023-12-11 ENCOUNTER — Other Ambulatory Visit: Payer: Self-pay

## 2023-12-11 ENCOUNTER — Other Ambulatory Visit: Payer: Self-pay | Admitting: Internal Medicine

## 2023-12-11 MED ORDER — ISOSORBIDE MONONITRATE ER 60 MG PO TB24
60.0000 mg | ORAL_TABLET | Freq: Two times a day (BID) | ORAL | 3 refills | Status: AC
  Filled 2023-12-11: qty 180, 90d supply, fill #0
  Filled 2024-01-18 – 2024-03-10 (×2): qty 180, 90d supply, fill #1
  Filled 2024-06-08: qty 180, 90d supply, fill #2
  Filled 2024-09-04: qty 180, 90d supply, fill #3

## 2023-12-11 MED FILL — Trazodone HCl Tab 100 MG: ORAL | 90 days supply | Qty: 180 | Fill #0 | Status: AC
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
## 2023-12-16 ENCOUNTER — Ambulatory Visit (INDEPENDENT_AMBULATORY_CARE_PROVIDER_SITE_OTHER): Admitting: Clinical

## 2023-12-16 DIAGNOSIS — F331 Major depressive disorder, recurrent, moderate: Secondary | ICD-10-CM

## 2023-12-16 DIAGNOSIS — F419 Anxiety disorder, unspecified: Secondary | ICD-10-CM

## 2023-12-16 NOTE — Progress Notes (Signed)
 [facility] Counselor/Therapist Progress Note  Patient ID: [name], MRN: [id number],    Date: 12/16/2023  Time Spent: 8:35am - 9:25am : 50 minutes   Treatment Type: Individual Therapy  Reported Symptoms: anxiety  Mental Status Exam: Appearance:  Neat and Well Groomed     Behavior: Appropriate  Motor: Normal  Speech/Language:  Clear and Coherent and Normal Rate  Affect: Appropriate  Mood: anxious  Thought process: normal  Thought content:   WNL  Sensory/Perceptual disturbances:   WNL  Orientation: oriented to person, place, situation, and day of week  Attention: Good  Concentration: Good  Memory: WNL  Fund of knowledge:  Good  Insight:   Good  Judgment:  Good  Impulse Control: Good   Risk Assessment: Danger to Self:  No Patient denied current suicidal ideation  Self-injurious Behavior: No Danger to Others: No Patient denied current homicidal ideation Duty to Warn:no Physical Aggression / Violence:No  Access to Firearms a concern: No  Gang Involvement:No   Subjective: Patient reported he is scheduled to have knee surgery on Friday, April 25th. Patient stated, "I'm not antsy about the surgery itself". Patient reported concern regarding the recovery from the surgery. Patient stated, "Im just nervous, anxiety, there's stress" and stated, "a lot of it has to do with after the surgery, walking around at home, doing my exercises". Patient stated, "I'm very apprehensive". Patient stated, "there's a lot of apprehension of post op". Patient stated, "I think it's the waking up and trying to understand what's being done". Patient reported thoughts of "waking up laying in bed with my knee split open and possibly not understanding what is going on". Patient reported concern regarding spending the night in the hospital. Patient reported thoughts of 4 am blood draws, 2 hour temperature checks, concern regarding not receiving daily medications, and concern regarding an  extended stay in the hospital are triggers for anxiety. Patient reported the sounds in the hallway are triggers for anxiety regarding upcoming hospital stay and stated, "I don't want to be disturbed".  Patient reported the door opening in the hospital triggers memories from the previous hospitalizations. Patient reported upcoming hospitalization triggers thoughts associated with previous hospitalizations.   Interventions: Cognitive Behavioral Therapy. Clinician conducted session in person at clinician's office at [facility]. Reviewed events since last session and assessed for changes. Discussed patient's concerns as it relates to patient's upcoming surgery. Assisted patient in exploring and identifying triggers for feelings of anxiety. Assisted patient in exploring and identifying thoughts and feelings triggered by upcoming surgery. Discussed strategies to address patient's concerns related to upcoming hospitalization.  Provided psycho education related to use of positive self talk, cognitive restructuring, and cycle of anxiety. Clinician requested for homework patient practice challenging and identifying evidence for/against negative thoughts associated with surgery.    Collaboration of Care: Other not required at this time   Diagnosis:  Major depressive disorder, recurrent episode, moderate (HCC)   Anxiety disorder, unspecified type     Plan: Patient is to utilize [employer] Therapy, thought re-framing, behavioral activation, relaxation techniques, mindfulness and coping strategies to decrease symptoms associated with their diagnosis. Frequency: weekly  Modality: individual      Long-term goal:   Reduce overall level, frequency, and intensity of the feelings of depression and anxiety as evidenced by decreased loss of motivation, loss of interest, psychomotor retardation, worry, lack of energy, difficulty concentrating, depressed mood, difficulty falling asleep and returning to  sleep, and inactivity from 7 days/week to 0 to 1  days/week per patient report for at least 3 consecutive months. Target Date: 11/25/24  Progress: progressing    Short-term goal:  Increase patient's participation in physical activities, such as, yard work, walking half way around patient's neighborhood, walking nature trail located near patient's home from 1 time per week to 2 times week Target Date: 05/27/24  Progress: progressing    Increase patient's participation in activities/projects patient enjoys from 0 times per week to 3-4 times per week  Target Date: 05/27/24  Progress: progressing    Identify, challenge, and replace negative core beliefs, thought patterns, and negative self talk that contribute to feelings of depression and anxiety with positive thoughts, beliefs, and positive self talk per patient's report Target Date: 05/27/24  Progress: progressing    Continue to make healthy lifestyle changes, such as, healthy dietary choices and increase physical activity Target Date: 05/27/24  Progress: progressing                          Burlene Carpen, LCSW

## 2023-12-16 NOTE — Progress Notes (Signed)
   Kevin Barthel, LCSW

## 2023-12-18 ENCOUNTER — Telehealth: Payer: Self-pay | Admitting: *Deleted

## 2023-12-18 NOTE — Telephone Encounter (Signed)
 Ortho pre-op call completed.
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
## 2023-12-18 NOTE — Anesthesia Preprocedure Evaluation (Signed)
 Anesthesia Evaluation  Patient identified by MRN, date of birth, ID band Patient awake    Reviewed: Allergy & Precautions, NPO status , Patient's Chart, lab work & pertinent test results  Airway Mallampati: I  TM Distance: >3 FB Neck ROM: Full    Dental no notable dental hx. (+) Edentulous Upper, Edentulous Lower   Pulmonary sleep apnea and Continuous Positive Airway Pressure Ventilation , former smoker   Pulmonary exam normal breath sounds clear to auscultation       Cardiovascular hypertension, (-) angina + CAD and +CHF (HFrEF)  (-) Past MI Normal cardiovascular exam Rhythm:Regular Rate:Normal  Echo 03/18/2023  1. Left ventricular ejection fraction, by estimation, is 55 to 60%. The  left ventricle has normal function. Left ventricular endocardial border  not optimally defined to evaluate regional wall motion. There is moderate  left ventricular hypertrophy. Left  ventricular diastolic parameters are consistent with Grade I diastolic  dysfunction (impaired relaxation).   2. Right ventricular systolic function is normal. The right ventricular  size is normal. Tricuspid regurgitation signal is inadequate for assessing  PA pressure.   3. Right atrial size was mildly dilated.   4. The mitral valve is normal in structure. No evidence of mitral valve  regurgitation. No evidence of mitral stenosis.   5. The aortic valve is normal in structure. Aortic valve regurgitation is  not visualized. No aortic stenosis is present.   6. Aortic dilatation noted. There is mild dilatation of the ascending  aorta, measuring 40 mm.     Neuro/Psych  PSYCHIATRIC DISORDERS Anxiety Depression       GI/Hepatic   Endo/Other  Hypothyroidism    Renal/GU Lab Results      Component                Value               Date                             K                        3.7                 12/10/2023                CO2                      23                   12/10/2023                BUN                      21                  12/10/2023                CREATININE               0.95                12/10/2023                     Musculoskeletal  (+) Arthritis , Osteoarthritis,    Abdominal  (+) + obese  Peds  Hematology Lab Results      Component  Value               Date                      WBC                      10.5                12/10/2023                HGB                      14.4                12/10/2023                HCT                      46.0                12/10/2023                MCV                      90.6                12/10/2023                PLT                      240                 12/10/2023              Anesthesia Other Findings All: pcn amoxicillin  Reproductive/Obstetrics                             Anesthesia Physical Anesthesia Plan  ASA: 3  Anesthesia Plan: Regional and Spinal   Post-op Pain Management: Minimal or no pain anticipated and Ofirmev  IV (intra-op)*   Induction:   PONV Risk Score and Plan: 1 and Ondansetron , Treatment may vary due to age or medical condition and Propofol  infusion  Airway Management Planned: Nasal Cannula and Natural Airway  Additional Equipment: None  Intra-op Plan:   Post-operative Plan:   Informed Consent: I have reviewed the patients History and Physical, chart, labs and discussed the procedure including the risks, benefits and alternatives for the proposed anesthesia with the patient or authorized representative who has indicated his/her understanding and acceptance.     Dental advisory given  Plan Discussed with: CRNA and Surgeon  Anesthesia Plan Comments: (Spinal w L adductor)       Anesthesia Quick Evaluation

## 2023-12-18 NOTE — Care Plan (Signed)
 [facility] RNCM call to patient to discuss his upcoming Left total knee arthroplasty with Dr. Lucienne Ryder at [facility] on 12/19/23. He is agreeable to case management. He lives with his wife, who will be assisting him at home after discharge. He has a RW already. Anticipate HHPT will be needed after a short hospital stay. Referral made to [facility] after choice provided. Reviewed post op care instructions and answered questions related to care. Will continue to follow for needs.

## 2023-12-19 ENCOUNTER — Encounter (HOSPITAL_COMMUNITY): Payer: Self-pay | Admitting: Orthopaedic Surgery

## 2023-12-19 ENCOUNTER — Ambulatory Visit (HOSPITAL_BASED_OUTPATIENT_CLINIC_OR_DEPARTMENT_OTHER): Payer: Self-pay | Admitting: Anesthesiology

## 2023-12-19 ENCOUNTER — Other Ambulatory Visit: Payer: Self-pay

## 2023-12-19 ENCOUNTER — Observation Stay (HOSPITAL_COMMUNITY)

## 2023-12-19 ENCOUNTER — Encounter (HOSPITAL_COMMUNITY)
Admission: RE | Disposition: A | Discharge status: Home / Self Care | Payer: Self-pay | Source: Ambulatory Visit | Attending: Orthopaedic Surgery

## 2023-12-19 ENCOUNTER — Ambulatory Visit (HOSPITAL_COMMUNITY): Payer: Self-pay | Admitting: Physician Assistant

## 2023-12-19 ENCOUNTER — Observation Stay (HOSPITAL_COMMUNITY)
Admission: RE | Admit: 2023-12-19 | Discharge: 2023-12-22 | Disposition: A | Discharge status: Home / Self Care | Source: Ambulatory Visit | Attending: Orthopaedic Surgery | Admitting: Orthopaedic Surgery

## 2023-12-19 DIAGNOSIS — I5032 Chronic diastolic (congestive) heart failure: Secondary | ICD-10-CM | POA: Insufficient documentation

## 2023-12-19 DIAGNOSIS — M1712 Unilateral primary osteoarthritis, left knee: Secondary | ICD-10-CM

## 2023-12-19 DIAGNOSIS — I110 Hypertensive heart disease with heart failure: Secondary | ICD-10-CM

## 2023-12-19 DIAGNOSIS — E039 Hypothyroidism, unspecified: Secondary | ICD-10-CM

## 2023-12-19 DIAGNOSIS — I5022 Chronic systolic (congestive) heart failure: Secondary | ICD-10-CM | POA: Diagnosis not present

## 2023-12-19 DIAGNOSIS — G8918 Other acute postprocedural pain: Secondary | ICD-10-CM | POA: Diagnosis not present

## 2023-12-19 DIAGNOSIS — Z96652 Presence of left artificial knee joint: Secondary | ICD-10-CM | POA: Diagnosis not present

## 2023-12-19 DIAGNOSIS — Z87891 Personal history of nicotine dependence: Secondary | ICD-10-CM | POA: Insufficient documentation

## 2023-12-19 DIAGNOSIS — I25118 Atherosclerotic heart disease of native coronary artery with other forms of angina pectoris: Secondary | ICD-10-CM | POA: Diagnosis not present

## 2023-12-19 DIAGNOSIS — I5020 Unspecified systolic (congestive) heart failure: Secondary | ICD-10-CM | POA: Diagnosis not present

## 2023-12-19 DIAGNOSIS — Z79899 Other long term (current) drug therapy: Secondary | ICD-10-CM | POA: Diagnosis not present

## 2023-12-19 DIAGNOSIS — Z7982 Long term (current) use of aspirin: Secondary | ICD-10-CM | POA: Diagnosis not present

## 2023-12-19 DIAGNOSIS — Z471 Aftercare following joint replacement surgery: Secondary | ICD-10-CM | POA: Diagnosis not present

## 2023-12-19 DIAGNOSIS — I2510 Atherosclerotic heart disease of native coronary artery without angina pectoris: Secondary | ICD-10-CM | POA: Diagnosis not present

## 2023-12-19 HISTORY — PX: TOTAL KNEE ARTHROPLASTY: SHX125

## 2023-12-19 SURGERY — ARTHROPLASTY, KNEE, TOTAL
Anesthesia: Regional | Site: Knee | Laterality: Left

## 2023-12-19 MED ORDER — PROPOFOL 10 MG/ML IV BOLUS
INTRAVENOUS | Status: DC | PRN
  Administered 2023-12-19: 20 mg via INTRAVENOUS
  Administered 2023-12-19: 30 mg via INTRAVENOUS
  Administered 2023-12-19: 65 ug/kg/min via INTRAVENOUS

## 2023-12-19 MED ORDER — ONDANSETRON HCL 4 MG/2ML IJ SOLN: 4.0000 mg | Freq: Four times a day (QID) | INTRAMUSCULAR | Status: DC | PRN

## 2023-12-19 MED ORDER — CEFAZOLIN SODIUM-DEXTROSE 3-4 GM/150ML-% IV SOLN
3.0000 g | INTRAVENOUS | Status: AC
  Administered 2023-12-19: 3 g via INTRAVENOUS
  Filled 2023-12-19: qty 150

## 2023-12-19 MED ORDER — METHOCARBAMOL 1000 MG/10ML IJ SOLN: 500.0000 mg | Freq: Four times a day (QID) | INTRAMUSCULAR | Status: DC | PRN

## 2023-12-19 MED ORDER — PHENOL 1.4 % MT LIQD: 1.0000 | OROMUCOSAL | Status: DC | PRN

## 2023-12-19 MED ORDER — PROPOFOL 1000 MG/100ML IV EMUL
INTRAVENOUS | Status: AC
  Filled 2023-12-19: qty 100

## 2023-12-19 MED ORDER — PHENYLEPHRINE HCL-NACL 20-0.9 MG/250ML-% IV SOLN
INTRAVENOUS | Status: DC | PRN
  Administered 2023-12-19: 25 ug/min via INTRAVENOUS

## 2023-12-19 MED ORDER — MIDAZOLAM HCL 2 MG/2ML IJ SOLN
1.0000 mg | INTRAMUSCULAR | Status: AC
  Administered 2023-12-19: 2 mg via INTRAVENOUS
  Filled 2023-12-19: qty 2

## 2023-12-19 MED ORDER — BUPIVACAINE-EPINEPHRINE 0.25% -1:200000 IJ SOLN
INTRAMUSCULAR | Status: DC | PRN
  Administered 2023-12-19: 30 mL

## 2023-12-19 MED ORDER — METHYLPHENIDATE HCL 5 MG PO TABS
10.0000 mg | ORAL_TABLET | Freq: Two times a day (BID) | ORAL | Status: DC
  Administered 2023-12-20 – 2023-12-22 (×6): 10 mg via ORAL
  Filled 2023-12-19 (×6): qty 2

## 2023-12-19 MED ORDER — BUPROPION HCL ER (XL) 300 MG PO TB24
300.0000 mg | ORAL_TABLET | Freq: Every morning | ORAL | Status: DC
  Administered 2023-12-20 – 2023-12-22 (×3): 300 mg via ORAL
  Filled 2023-12-19 (×3): qty 1

## 2023-12-19 MED ORDER — ISOSORBIDE MONONITRATE ER 60 MG PO TB24
60.0000 mg | ORAL_TABLET | Freq: Two times a day (BID) | ORAL | Status: DC
  Administered 2023-12-19 – 2023-12-22 (×6): 60 mg via ORAL
  Filled 2023-12-19 (×6): qty 1

## 2023-12-19 MED ORDER — MENTHOL 3 MG MT LOZG: 1.0000 | LOZENGE | OROMUCOSAL | Status: DC | PRN

## 2023-12-19 MED ORDER — ONDANSETRON HCL 4 MG/2ML IJ SOLN
INTRAMUSCULAR | Status: AC
  Filled 2023-12-19: qty 2

## 2023-12-19 MED ORDER — AMISULPRIDE (ANTIEMETIC) 5 MG/2ML IV SOLN: 10.0000 mg | Freq: Once | INTRAVENOUS | Status: DC | PRN

## 2023-12-19 MED ORDER — LACTATED RINGERS IV SOLN: INTRAVENOUS | Status: DC

## 2023-12-19 MED ORDER — ONDANSETRON HCL 4 MG PO TABS
4.0000 mg | ORAL_TABLET | Freq: Four times a day (QID) | ORAL | Status: DC | PRN
  Administered 2023-12-22: 4 mg via ORAL
  Filled 2023-12-19 (×2): qty 1

## 2023-12-19 MED ORDER — SODIUM CHLORIDE 0.9 % IV SOLN: INTRAVENOUS | Status: AC

## 2023-12-19 MED ORDER — ALUM & MAG HYDROXIDE-SIMETH 200-200-20 MG/5ML PO SUSP: 30.0000 mL | ORAL | Status: DC | PRN

## 2023-12-19 MED ORDER — OXYCODONE HCL 5 MG/5ML PO SOLN: 5.0000 mg | Freq: Once | ORAL | Status: DC | PRN

## 2023-12-19 MED ORDER — FENTANYL CITRATE PF 50 MCG/ML IJ SOSY
50.0000 ug | PREFILLED_SYRINGE | INTRAMUSCULAR | Status: AC
  Administered 2023-12-19: 50 ug via INTRAVENOUS
  Filled 2023-12-19: qty 2

## 2023-12-19 MED ORDER — ONDANSETRON HCL 4 MG/2ML IJ SOLN: 4.0000 mg | Freq: Once | INTRAMUSCULAR | Status: DC | PRN

## 2023-12-19 MED ORDER — HYDROMORPHONE HCL 1 MG/ML IJ SOLN
0.5000 mg | INTRAMUSCULAR | Status: DC | PRN
Start: 2023-12-19 — End: 2023-12-20
  Administered 2023-12-19 – 2023-12-20 (×3): 1 mg via INTRAVENOUS
  Filled 2023-12-19 (×3): qty 1

## 2023-12-19 MED ORDER — BUPIVACAINE IN DEXTROSE 0.75-8.25 % IT SOLN
INTRATHECAL | Status: DC | PRN
  Administered 2023-12-19: 1.8 mL via INTRATHECAL

## 2023-12-19 MED ORDER — ASPIRIN 81 MG PO CHEW
81.0000 mg | CHEWABLE_TABLET | Freq: Two times a day (BID) | ORAL | Status: DC
  Administered 2023-12-19 – 2023-12-22 (×6): 81 mg via ORAL
  Filled 2023-12-19 (×6): qty 1

## 2023-12-19 MED ORDER — STERILE WATER FOR IRRIGATION IR SOLN
Status: DC | PRN
  Administered 2023-12-19: 1000 mL

## 2023-12-19 MED ORDER — AMLODIPINE BESYLATE 5 MG PO TABS
5.0000 mg | ORAL_TABLET | Freq: Every day | ORAL | Status: DC
  Administered 2023-12-20 – 2023-12-22 (×3): 5 mg via ORAL
  Filled 2023-12-19 (×3): qty 1

## 2023-12-19 MED ORDER — CLONIDINE HCL (ANALGESIA) 100 MCG/ML EP SOLN
EPIDURAL | Status: DC | PRN
  Administered 2023-12-19: 100 ug

## 2023-12-19 MED ORDER — TRANEXAMIC ACID-NACL 1000-0.7 MG/100ML-% IV SOLN
1000.0000 mg | INTRAVENOUS | Status: AC
  Administered 2023-12-19: 1000 mg via INTRAVENOUS
  Filled 2023-12-19: qty 100

## 2023-12-19 MED ORDER — DULOXETINE HCL 60 MG PO CPEP
60.0000 mg | ORAL_CAPSULE | Freq: Every morning | ORAL | Status: DC
  Administered 2023-12-20 – 2023-12-22 (×3): 60 mg via ORAL
  Filled 2023-12-19 (×3): qty 1

## 2023-12-19 MED ORDER — OXYCODONE HCL 5 MG PO TABS
5.0000 mg | ORAL_TABLET | ORAL | Status: DC | PRN
Start: 2023-12-19 — End: 2023-12-20
  Administered 2023-12-19: 10 mg via ORAL
  Administered 2023-12-19: 5 mg via ORAL
  Administered 2023-12-20: 10 mg via ORAL
  Filled 2023-12-19 (×3): qty 2

## 2023-12-19 MED ORDER — METOCLOPRAMIDE HCL 5 MG/ML IJ SOLN: 5.0000 mg | Freq: Three times a day (TID) | INTRAMUSCULAR | Status: DC | PRN

## 2023-12-19 MED ORDER — METHOCARBAMOL 500 MG PO TABS
500.0000 mg | ORAL_TABLET | Freq: Four times a day (QID) | ORAL | Status: DC | PRN
  Administered 2023-12-19 – 2023-12-22 (×5): 500 mg via ORAL
  Filled 2023-12-19 (×5): qty 1

## 2023-12-19 MED ORDER — HYDROMORPHONE HCL 1 MG/ML IJ SOLN: 0.2500 mg | INTRAMUSCULAR | Status: DC | PRN

## 2023-12-19 MED ORDER — PANTOPRAZOLE SODIUM 40 MG PO TBEC
40.0000 mg | DELAYED_RELEASE_TABLET | Freq: Every day | ORAL | Status: DC
  Administered 2023-12-20 – 2023-12-22 (×3): 40 mg via ORAL
  Filled 2023-12-19 (×4): qty 1

## 2023-12-19 MED ORDER — DIPHENHYDRAMINE HCL 12.5 MG/5ML PO ELIX: 12.5000 mg | ORAL_SOLUTION | ORAL | Status: DC | PRN

## 2023-12-19 MED ORDER — OXYCODONE HCL 5 MG PO TABS
10.0000 mg | ORAL_TABLET | ORAL | Status: DC | PRN
  Administered 2023-12-20 – 2023-12-22 (×9): 10 mg via ORAL
  Filled 2023-12-19 (×7): qty 2

## 2023-12-19 MED ORDER — LEVOTHYROXINE SODIUM 88 MCG PO TABS
88.0000 ug | ORAL_TABLET | Freq: Every day | ORAL | Status: DC
  Administered 2023-12-20 – 2023-12-22 (×3): 88 ug via ORAL
  Filled 2023-12-19 (×3): qty 1

## 2023-12-19 MED ORDER — DOCUSATE SODIUM 100 MG PO CAPS
100.0000 mg | ORAL_CAPSULE | Freq: Two times a day (BID) | ORAL | Status: DC
  Administered 2023-12-19 – 2023-12-22 (×6): 100 mg via ORAL
  Filled 2023-12-19 (×6): qty 1

## 2023-12-19 MED ORDER — 0.9 % SODIUM CHLORIDE (POUR BTL) OPTIME
TOPICAL | Status: DC | PRN
  Administered 2023-12-19: 1000 mL

## 2023-12-19 MED ORDER — GABAPENTIN 300 MG PO CAPS
1200.0000 mg | ORAL_CAPSULE | Freq: Every day | ORAL | Status: DC
  Administered 2023-12-19 – 2023-12-21 (×3): 1200 mg via ORAL
  Filled 2023-12-19 (×3): qty 4

## 2023-12-19 MED ORDER — METOCLOPRAMIDE HCL 5 MG PO TABS: 5.0000 mg | ORAL_TABLET | Freq: Three times a day (TID) | ORAL | Status: DC | PRN

## 2023-12-19 MED ORDER — METOPROLOL SUCCINATE ER 25 MG PO TB24
25.0000 mg | ORAL_TABLET | Freq: Every day | ORAL | Status: DC
  Administered 2023-12-20 – 2023-12-22 (×3): 25 mg via ORAL
  Filled 2023-12-19 (×3): qty 1

## 2023-12-19 MED ORDER — NITROGLYCERIN 0.3 MG SL SUBL: 0.3000 mg | SUBLINGUAL_TABLET | SUBLINGUAL | Status: DC | PRN

## 2023-12-19 MED ORDER — ACETAMINOPHEN 325 MG PO TABS
325.0000 mg | ORAL_TABLET | Freq: Four times a day (QID) | ORAL | Status: DC | PRN
  Administered 2023-12-21: 650 mg via ORAL
  Filled 2023-12-19: qty 2

## 2023-12-19 MED ORDER — ONDANSETRON HCL 4 MG/2ML IJ SOLN
INTRAMUSCULAR | Status: DC | PRN
  Administered 2023-12-19: 4 mg via INTRAVENOUS

## 2023-12-19 MED ORDER — ACETAMINOPHEN 10 MG/ML IV SOLN: 1000.0000 mg | Freq: Once | INTRAVENOUS | Status: DC | PRN

## 2023-12-19 MED ORDER — OXYCODONE HCL 5 MG PO TABS: 5.0000 mg | ORAL_TABLET | Freq: Once | ORAL | Status: DC | PRN

## 2023-12-19 MED ORDER — ORAL CARE MOUTH RINSE: 15.0000 mL | Freq: Once | OROMUCOSAL | Status: AC

## 2023-12-19 MED ORDER — SODIUM CHLORIDE 0.9 % IR SOLN
Status: DC | PRN
Start: 2023-12-19 — End: 2023-12-19
  Administered 2023-12-19: 1000 mL

## 2023-12-19 MED ORDER — ROPIVACAINE HCL 5 MG/ML IJ SOLN
INTRAMUSCULAR | Status: DC | PRN
  Administered 2023-12-19: 30 mL via PERINEURAL

## 2023-12-19 MED ORDER — CHLORHEXIDINE GLUCONATE 0.12 % MT SOLN
15.0000 mL | Freq: Once | OROMUCOSAL | Status: AC
  Administered 2023-12-19: 15 mL via OROMUCOSAL

## 2023-12-19 MED ORDER — POVIDONE-IODINE 10 % EX SWAB: 2.0000 | Freq: Once | CUTANEOUS | Status: DC

## 2023-12-19 MED ORDER — BUPIVACAINE-EPINEPHRINE (PF) 0.25% -1:200000 IJ SOLN
INTRAMUSCULAR | Status: AC
  Filled 2023-12-19: qty 30

## 2023-12-19 SURGICAL SUPPLY — 53 items
BAG COUNTER SPONGE SURGICOUNT (BAG) IMPLANT
BAG ZIPLOCK 12X15 (MISCELLANEOUS) ×1 IMPLANT
BASEPLATE CMT PS KNEE F 0D LT (Joint) IMPLANT
BENZOIN TINCTURE PRP APPL 2/3 (GAUZE/BANDAGES/DRESSINGS) IMPLANT
BLADE SAG 18X100X1.27 (BLADE) ×1 IMPLANT
BLADE SURG SZ10 CARB STEEL (BLADE) IMPLANT
BNDG ELASTIC 6INX 5YD STR LF (GAUZE/BANDAGES/DRESSINGS) ×2 IMPLANT
BOWL SMART MIX CTS (DISPOSABLE) IMPLANT
CEMENT BONE R 1X40 (Cement) IMPLANT
COOLER ICEMAN CLASSIC (MISCELLANEOUS) ×1 IMPLANT
COVER SURGICAL LIGHT HANDLE (MISCELLANEOUS) ×1 IMPLANT
CUFF TRNQT CYL 34X4.125X (TOURNIQUET CUFF) ×1 IMPLANT
DRAPE INCISE IOBAN 66X45 STRL (DRAPES) ×1 IMPLANT
DRAPE U-SHAPE 47X51 STRL (DRAPES) ×1 IMPLANT
DURAPREP 26ML APPLICATOR (WOUND CARE) ×1 IMPLANT
ELECT BLADE TIP CTD 4 INCH (ELECTRODE) ×1 IMPLANT
ELECT PENCIL ROCKER SW 15FT (MISCELLANEOUS) ×1 IMPLANT
ELECT REM PT RETURN 15FT ADLT (MISCELLANEOUS) ×1 IMPLANT
GAUZE PAD ABD 8X10 STRL (GAUZE/BANDAGES/DRESSINGS) ×2 IMPLANT
GAUZE SPONGE 4X4 12PLY STRL (GAUZE/BANDAGES/DRESSINGS) ×1 IMPLANT
GAUZE XEROFORM 1X8 LF (GAUZE/BANDAGES/DRESSINGS) IMPLANT
GLOVE BIO SURGEON STRL SZ7.5 (GLOVE) ×1 IMPLANT
GLOVE BIOGEL PI IND STRL 8 (GLOVE) ×2 IMPLANT
GLOVE ECLIPSE 8.0 STRL XLNG CF (GLOVE) ×1 IMPLANT
GOWN STRL REUS W/ TWL XL LVL3 (GOWN DISPOSABLE) ×2 IMPLANT
HOLDER FOLEY CATH W/STRAP (MISCELLANEOUS) IMPLANT
IMMOBILIZER KNEE 20 (SOFTGOODS) ×1 IMPLANT
IMMOBILIZER KNEE 20 THIGH 36 (SOFTGOODS) ×1 IMPLANT
KIT TURNOVER KIT A (KITS) IMPLANT
MANIFOLD NEPTUNE II (INSTRUMENTS) ×1 IMPLANT
NS IRRIG 1000ML POUR BTL (IV SOLUTION) ×1 IMPLANT
PACK TOTAL KNEE CUSTOM (KITS) ×1 IMPLANT
PAD COLD SHLDR WRAP-ON (PAD) ×1 IMPLANT
PADDING CAST COTTON 6X4 STRL (CAST SUPPLIES) ×2 IMPLANT
PIN DRILL HDLS TROCAR 75 4PK (PIN) IMPLANT
PROTECTOR NERVE ULNAR (MISCELLANEOUS) ×1 IMPLANT
SCREW FEMALE HEX FIX 25X2.5 (ORTHOPEDIC DISPOSABLE SUPPLIES) IMPLANT
SET HNDPC FAN SPRY TIP SCT (DISPOSABLE) ×1 IMPLANT
SET PAD KNEE POSITIONER (MISCELLANEOUS) ×1 IMPLANT
SPIKE FLUID TRANSFER (MISCELLANEOUS) IMPLANT
STAPLER SKIN PROX WIDE 3.9 (STAPLE) IMPLANT
STEM ARTISURF EF 12 SZ8-11 (Stem) IMPLANT
STEM POLY PAT PLY 35M KNEE (Knees) IMPLANT
STRIP CLOSURE SKIN 1/2X4 (GAUZE/BANDAGES/DRESSINGS) IMPLANT
SURFACE ARTC PRSNA CCR SZ10 L (Joint) IMPLANT
SUT MNCRL AB 4-0 PS2 18 (SUTURE) IMPLANT
SUT VIC AB 0 CT1 36 (SUTURE) ×2 IMPLANT
SUT VIC AB 1 CT1 36 (SUTURE) ×2 IMPLANT
SUT VIC AB 2-0 CT1 TAPERPNT 27 (SUTURE) ×2 IMPLANT
TOWEL GREEN STERILE FF (TOWEL DISPOSABLE) ×1 IMPLANT
TRAY FOLEY MTR SLVR 16FR STAT (SET/KITS/TRAYS/PACK) IMPLANT
WATER STERILE IRR 1000ML POUR (IV SOLUTION) ×2 IMPLANT
YANKAUER SUCT BULB TIP NO VENT (SUCTIONS) ×1 IMPLANT

## 2023-12-19 NOTE — Progress Notes (Signed)
 Pt placed on hospital Resmed CPAP machine with pts home Mask and Tubing. Pt is on Room Air at this time and is tolerating well. Pt is also tolerating CPAP well. No distress noted. Will continue to monitor pt.    12/19/23 2238  BiPAP/CPAP/SIPAP  $ Non-Invasive Home Ventilator  Initial  BiPAP/CPAP/SIPAP Pt Type Adult  BiPAP/CPAP/SIPAP Resmed  Mask Type Nasal mask  Dentures removed? Not applicable  Patient Home Machine No  Patient Home Mask Yes  Patient Home Tubing Yes  Auto Titrate Yes  CPAP/SIPAP surface wiped down Yes  Device Plugged into RED Power Outlet Yes  BiPAP/CPAP /SiPAP Vitals  Pulse Rate 75  Resp 16  SpO2 94 %  Bilateral Breath Sounds Clear  MEWS Score/Color  MEWS Score 0  MEWS Score Color Marrie Sizer

## 2023-12-19 NOTE — Anesthesia Postprocedure Evaluation (Signed)
 Anesthesia Post Note  Patient: [name]  Procedure(s) Performed: ARTHROPLASTY, KNEE, TOTAL (Left: Knee)     Patient location during evaluation: Nursing Unit Anesthesia Type: Regional Level of consciousness: oriented and awake and alert Pain management: pain level controlled Vital Signs Assessment: post-procedure vital signs reviewed and stable Respiratory status: spontaneous breathing and respiratory function stable Cardiovascular status: blood pressure returned to baseline and stable Postop Assessment: no headache, no backache, no apparent nausea or vomiting and patient able to bend at knees Anesthetic complications: no  No notable events documented.  Last Vitals:  Vitals:   12/19/23 1405 12/19/23 1415  BP:  90/66  Pulse: (!) 52 (!) 49  Resp: 13 11  Temp:    SpO2: 98% 99%    Last Pain:  Vitals:   12/19/23 1415  TempSrc:   PainSc: 0-No pain    LLE Motor Response: No movement due to regional block (12/19/23 1415) LLE Sensation: No sensation (absent) (12/19/23 1415) RLE Motor Response: No movement due to regional block (12/19/23 1415) RLE Sensation: No sensation (absent) (12/19/23 1415) L Sensory Level: L2-Upper inner thigh, upper buttock (12/19/23 1415) R Sensory Level: L2-Upper inner thigh, upper buttock (12/19/23 1415)  Rosalita Combe

## 2023-12-19 NOTE — Interval H&P Note (Signed)
 History and Physical Interval Note: Patient understands that he is here today for a left total knee replacement to treat his significant left knee pain and arthritis.  There has been no acute or interval change in his medical status.  The risks and benefits of surgery have been discussed in detail and informed consent has been obtained.  The left operative knee has been marked.  12/19/2023 10:10 AM  [name]  has presented today for surgery, with the diagnosis of osteoarthritis left knee.  The various methods of treatment have been discussed with the patient and family. After consideration of risks, benefits and other options for treatment, the patient has consented to  Procedure(s): ARTHROPLASTY, KNEE, TOTAL (Left) as a surgical intervention.  The patient's history has been reviewed, patient examined, no change in status, stable for surgery.  I have reviewed the patient's chart and labs.  Questions were answered to the patient's satisfaction.     Arnie Lao

## 2023-12-19 NOTE — Anesthesia Procedure Notes (Signed)
 Spinal  Patient location during procedure: OR Start time: 12/19/2023 11:25 AM End time: 12/19/2023 11:31 AM Reason for block: surgical anesthesia Staffing Performed: anesthesiologist  Anesthesiologist: Juventino Oppenheim, MD Performed by: Juventino Oppenheim, MD Authorized by: Rosalita Combe, MD   Preanesthetic Checklist Completed: patient identified, IV checked, risks and benefits discussed, surgical consent, monitors and equipment checked, pre-op evaluation and timeout performed Spinal Block Patient position: sitting Prep: DuraPrep Patient monitoring: heart rate, cardiac monitor, continuous pulse ox and blood pressure Approach: midline Location: L2-3 Injection technique: single-shot Needle Needle type: Pencan  Needle gauge: 24 G Assessment Events: second provider Additional Notes Consent was obtained prior to the procedure with all questions answered and concerns addressed. Risks including, but not limited to, bleeding, infection, nerve damage, paralysis, failed block, inadequate analgesia, allergic reaction, high spinal, itching, and headache were discussed and the patient wished to proceed. Functioning IV was confirmed and monitors were applied. Sterile prep and drape, including hand hygiene, mask, and sterile gloves were used. The patient was positioned and the spine was prepped. The skin was anesthetized with lidocaine . After failed attempt by CRNA, Dr. Glenetta Lane successful. Free flow of clear CSF was obtained prior to injecting local anesthetic into the CSF. The spinal needle aspirated freely following injection. The needle was carefully withdrawn. The patient tolerated the procedure well.   Hobart Lulas, MD

## 2023-12-19 NOTE — Anesthesia Procedure Notes (Signed)
 Anesthesia Regional Block: Adductor canal block   Pre-Anesthetic Checklist: , timeout performed,  Correct Patient, Correct Site, Correct Laterality,  Correct Procedure, Correct Position, site marked,  Risks and benefits discussed,  Surgical consent,  Pre-op evaluation,  At surgeon's request and post-op pain management  Laterality: Lower  Prep: chloraprep       Needles:  Injection technique: Single-shot  Needle Type: Echogenic Needle     Needle Length: 9cm  Needle Gauge: 22     Additional Needles:   Procedures:,,,, ultrasound used (permanent image in chart),,    Narrative:  Start time: 12/19/2023 11:01 AM End time: 12/19/2023 11:07 AM Injection made incrementally with aspirations every 5 mL.  Performed by: Personally  Anesthesiologist: Rosalita Combe, MD  Additional Notes: Block assessed prior to surgery. Pt tolerated procedure well.

## 2023-12-19 NOTE — Anesthesia Procedure Notes (Signed)
 Procedure Name: MAC Date/Time: 12/19/2023 11:16 AM  Performed by: Jenene Miser, CRNAPre-anesthesia Checklist: Patient identified, Emergency Drugs available, Suction available and Patient being monitored Oxygen Delivery Method: Simple face mask

## 2023-12-19 NOTE — Op Note (Signed)
 Operative Note  Date of operation: 12/19/2023 Preoperative diagnosis: Left knee primary osteoarthritis Postoperative diagnosis: Same  Procedure: Left cemented total knee arthroplasty  Implants: Biomet/Zimmer persona cemented knee system Implant Name Type Inv. Item Serial No. Manufacturer Lot No. LRB No. Used Action  CEMENT BONE R 1X40 - [id number] Cement CEMENT BONE R 1X40  ZIMMER [employer]ON(ORTH,TRAU,BIO,SG) [id number] Left 1 Implanted  CEMENT BONE R 1X40 - [id number] Cement CEMENT BONE R 1X40  ZIMMER RECON(ORTH,TRAU,BIO,SG) [id number] Left 1 Implanted  STEM ARTISURF EF 12 SZ8-11 - [id number] Stem STEM ARTISURF EF 12 SZ8-11  ZIMMER RECON(ORTH,TRAU,BIO,SG) [id number] Left 1 Implanted  STEM POLY PAT PLY 104M KNEE - [id number] Knees STEM POLY PAT PLY 104M KNEE  ZIMMER RECON(ORTH,TRAU,BIO,SG) [id number] Left 1 Implanted  SURFACE ARTC PRSNA CCR SZ10 L - [id number] Joint SURFACE ARTC PRSNA CCR SZ10 L  ZIMMER [employer]ON(ORTH,TRAU,BIO,SG) [id number] Left 1 Implanted  BASEPLATE CMT PS KNEE F 0D LT - [id number] Joint BASEPLATE CMT PS KNEE F 0D LT  ZIMMER RECON(ORTH,TRAU,BIO,SG) [id number] Left 1 Implanted   Surgeon: Jeanella Milan. Lucienne Ryder, MD Assistant: Malena Scull, PA-C  Anesthesia: #1 left lower extremity adductor canal block, #2 spinal, #3 local Antibiotics: 3 g IV Ancef Tourniquet time: Under 1 hour EBL: Under 50 cc Complications: None  Indications: The patient is a very active 68 year old gentleman well-known to us .  He has debilitating arthritis in his left knee and at this point is failed conservative treatment for over a year.  His left knee pain is daily and it is detrimentally affecting his mobility, his quality of life and his actives daily living.  His x-rays confirm severe arthritis of the left knee as well as his clinical exam.  We did discuss the risks of acute blood loss anemia, nerve or vessel injury, fracture, infection, DVT, implant failure and wound healing issues.  He understands that these are  heightened given his morbid obesity but he has been on a weight loss journey and is lost significant weight.  His BMI is now 38.  He understands that our goals are decreased pain, improved mobility, and improved quality of life.  Procedure description: After informed consent was obtained and the appropriate left knee was marked, anesthesia obtained a left lower extremity adductor canal block in the holding room.  The patient was then brought to the operating room and set up on the operative table where spinal anesthesia was obtained.  He was then laid supine on the operating table and a nonsterile tractors placed around his upper left thigh.  His left thigh, knee, leg and ankle were prepped and draped with DuraPrep and sterile drapes including a sterile stockinette.  A timeout was called and he was identified as the correct patient the correct left knee.  An Esmarch was then used to relaxed out the leg and the tourniquet plated 300 mm pressure.  With the knee extended a direct midline incision was made over the patella and carried proximally distally.  Dissection was carried down to the knee joint and a medial parapatellar arthrotomy was made finding a large joint effusion.  With the knee in a flexed position we found significant cartilage wear throughout the knee.  The ACL as well as medial lateral meniscus were removed.  We then used an extramedullary based cutting guide for making her proximal tibia cut correction for varus and valgus and a 7 degree slope.  We made this cut to take 2 mm off the low side.  We made the cut without difficulty.  We then went  to the femur and put a extramedullary based cutting guide for distal femur cut setting this for a left knee at 5 degrees externally rotated and a 10 mm distal femoral cut.  We made that cut without difficulty and brought the knee back down to full extension and had achieved full extension with a 10 mm block.  We then backed the femur and put a femoral sizing  guide based off the epicondylar axis.  Based off of this we chose a size 10 femur.  We put a 4-in-1 cutting block for a size 10 femur and made our anterior and posterior cuts followed our chamfer cuts.  We then back to the tibia and chose a size F left tibial tray for coverage over the tibial plateau setting the rotation of the tibial tubercle and the femur.  We did our drill hole and keel punch off of this.  We are pleased with the alignment of the tibia so we placed our trial size F left tibia combined with our size 10 left CR standard femur.  We trialed a 10 mm thickness left medial congruent polythene insert and went up to a 12 mm thickness insert we are pleased with range of motion and stability at 12 mm insert.  We then made a patella cut and drilled 3 holes for a size 35 patella button.  Again we are pleased with range of motion and stability with all trial instrumentation in the knee.  We then removed all trial rotation and irrigated the knee with normal saline solution.  We then placed Marcaine  with epinephrine  around the arthrotomy.  Next we mixed our cement and with the knee dried and in a flexed position we cemented our Biomet/Zimmer persona tibial tray for a left knee size F followed by cementing our size 10 left CR standard femur.  We placed our 12 mm left medial congruent polythene insert and cemented our size 35 patella button.  We then held the knee fully come pressed and extended to allow the cement to harden.  Once it hardened the tourniquet is let down and hemostasis was obtained with electrocautery.  The arthrotomy was then closed with interrupted #1 Vicryl suture followed by 0 Vicryl close the deep tissue and 2-0 Vicryl to close subcutaneous tissue.  The skin was closed with staples.  Well-padded sterile dressing was applied.  The patient was taken the recovery room.  Malena Scull, PA-C did assist during the entire case and beginning in his assistance was crucial and medically necessary for soft  tissue management and retraction, helping guide implant placement and a layered closure of the wound.

## 2023-12-19 NOTE — Transfer of Care (Signed)
 Immediate Anesthesia Transfer of Care Note  Patient: [name]  Procedure(s) Performed: ARTHROPLASTY, KNEE, TOTAL (Left: Knee)  Patient Location: PACU  Anesthesia Type:MAC and Spinal  Level of Consciousness: awake, alert , and oriented  Airway & Oxygen Therapy: Patient Spontanous Breathing and Patient connected to face mask oxygen  Post-op Assessment: Report given to RN and Post -op Vital signs reviewed and stable  Post vital signs: Reviewed and stable  Last Vitals:  Vitals Value Taken Time  BP 100/65 12/19/23 1317  Temp    Pulse 60 12/19/23 1318  Resp 10 12/19/23 1318  SpO2 97 % 12/19/23 1318  Vitals shown include unfiled device data.  Last Pain:  Vitals:   12/19/23 0910  TempSrc:   PainSc: 2          Complications: No notable events documented.

## 2023-12-20 DIAGNOSIS — M1712 Unilateral primary osteoarthritis, left knee: Secondary | ICD-10-CM | POA: Diagnosis not present

## 2023-12-20 LAB — BASIC METABOLIC PANEL WITH GFR
Anion gap: 11 (ref 5–15)
BUN: 15 mg/dL (ref 8–23)
CO2: 25 mmol/L (ref 22–32)
Calcium: 8.6 mg/dL — ABNORMAL LOW (ref 8.9–10.3)
Chloride: 102 mmol/L (ref 98–111)
Creatinine, Ser: 0.77 mg/dL (ref 0.61–1.24)
GFR, Estimated: >60 mL/min (ref >60)
Glucose, Bld: 126 mg/dL — ABNORMAL HIGH (ref 70–99)
Potassium: 4.4 mmol/L (ref 3.5–5.1)
Sodium: 138 mmol/L (ref 135–145)

## 2023-12-20 LAB — CBC
HCT: 48 % (ref 39.0–52.0)
Hemoglobin: 14.7 g/dL (ref 13.0–17.0)
MCH: 28.5 pg (ref 26.0–34.0)
MCHC: 30.6 g/dL (ref 30.0–36.0)
MCV: 93 fL (ref 80.0–100.0)
Platelets: 187 10*3/uL (ref 150–400)
RBC: 5.16 MIL/uL (ref 4.22–5.81)
RDW: 13.5 % (ref 11.5–15.5)
WBC: 13.2 10*3/uL — ABNORMAL HIGH (ref 4.0–10.5)
nRBC: 0 % (ref 0.0–0.2)

## 2023-12-20 MED ORDER — TAMSULOSIN HCL 0.4 MG PO CAPS
0.4000 mg | ORAL_CAPSULE | Freq: Every day | ORAL | Status: DC
Start: 2023-12-20 — End: 2023-12-22
  Administered 2023-12-20 – 2023-12-22 (×3): 0.4 mg via ORAL
  Filled 2023-12-20 (×3): qty 1

## 2023-12-20 MED ORDER — OXYCODONE HCL 5 MG PO TABS
5.0000 mg | ORAL_TABLET | ORAL | Status: DC | PRN
  Administered 2023-12-20 – 2023-12-22 (×2): 10 mg via ORAL
  Filled 2023-12-20 (×4): qty 2

## 2023-12-20 MED ORDER — TIZANIDINE HCL 4 MG PO TABS: 4.0000 mg | ORAL_TABLET | Freq: Four times a day (QID) | ORAL | 0 refills | Status: DC | PRN

## 2023-12-20 MED ORDER — OXYCODONE HCL 5 MG PO TABS
5.0000 mg | ORAL_TABLET | ORAL | 0 refills | Status: DC | PRN
Start: 2023-12-20 — End: 2023-12-26

## 2023-12-20 MED ORDER — ASPIRIN 81 MG PO CHEW: 81.0000 mg | CHEWABLE_TABLET | Freq: Two times a day (BID) | ORAL | 0 refills | Status: DC

## 2023-12-20 MED ORDER — HYDROMORPHONE HCL 1 MG/ML IJ SOLN: 1.0000 mg | INTRAMUSCULAR | Status: DC | PRN

## 2023-12-20 MED ORDER — KETOROLAC TROMETHAMINE 15 MG/ML IJ SOLN
7.5000 mg | Freq: Four times a day (QID) | INTRAMUSCULAR | Status: AC
  Administered 2023-12-20 (×3): 7.5 mg via INTRAVENOUS
  Filled 2023-12-20 (×3): qty 1

## 2023-12-20 NOTE — Evaluation (Signed)
 Physical Therapy Evaluation Patient Details Name: [name] MRN: [id number] DOB: [birth year] Today's Date: 12/20/2023  History of Present Illness  68 yo male s/p L TKA on 12/20/23. PMH: MDD, OSA, HTN, morbid obesity, CHF, burn scar contracture  Clinical Impression  Pt is s/p TKA resulting in the deficits listed below (see PT Problem List).  Pt amb limited by pain and fatigue, may need another day to work on pain control and mobility to allow safe d/c home   Pt will benefit from acute skilled PT to increase their independence and safety with mobility to allow discharge.          If plan is discharge home, recommend the following: A little help with walking and/or transfers;A little help with bathing/dressing/bathroom;Help with stairs or ramp for entrance;Assist for transportation;Assistance with cooking/housework   Can travel by private vehicle        Equipment Recommendations None recommended by PT  Recommendations for Other Services       Functional Status Assessment Patient has had a recent decline in their functional status and demonstrates the ability to make significant improvements in function in a reasonable and predictable amount of time.     Precautions / Restrictions Precautions Precautions: Fall;Knee Required Braces or Orthoses: Knee Immobilizer - Left Restrictions Weight Bearing Restrictions Per Provider Order: No Other Position/Activity Restrictions: WBAT      Mobility  Bed Mobility Overal bed mobility: Needs Assistance Bed Mobility: Supine to Sit     Supine to sit: Min assist     General bed mobility comments: assist with RLE    Transfers Overall transfer level: Needs assistance Equipment used: Rolling walker (2 wheels) Transfers: Sit to/from Stand Sit to Stand: Min assist, From elevated surface           General transfer comment: cues for hand placement and LLE Position    Ambulation/Gait Ambulation/Gait assistance: Min assist Gait  Distance (Feet): 15 Feet Assistive device: Rolling walker (2 wheels) Gait Pattern/deviations: Step-to pattern, Decreased step length - right, Decreased step length - left, Decreased stance time - left       General Gait Details: cues for sequence, use of UEs to off load LLE  Stairs            Wheelchair Mobility     Tilt Bed    Modified Rankin (Stroke Patients Only)       Balance Overall balance assessment: Mild deficits observed, not formally tested                                           Pertinent Vitals/Pain Pain Assessment Pain Assessment: Faces Faces Pain Scale: Hurts even more Pain Location: left knee Pain Descriptors / Indicators: Aching, Sore Pain Intervention(s): Limited activity within patient's tolerance, Monitored during session, Premedicated before session, Repositioned, Ice applied    Home Living Family/patient expects to be discharged to:: Private residence Living Arrangements: Spouse/significant other Available Help at Discharge: Family Type of Home: Mobile home Home Access: Stairs to enter Entrance Stairs-Rails: Right Entrance Stairs-Number of Steps: 2   Home Layout: One level Home Equipment: Agricultural consultant (2 wheels)      Prior Function Prior Level of Function : Independent/Modified Independent                     Extremity/Trunk Assessment   Upper Extremity Assessment Upper Extremity Assessment: Overall WFL for  tasks assessed    Lower Extremity Assessment Lower Extremity Assessment: LLE deficits/detail LLE Deficits / Details: ankle WFL, knee extension and hip flexion 2+/5       Communication   Communication Communication: No apparent difficulties    Cognition Arousal: Alert Behavior During Therapy: WFL for tasks assessed/performed   PT - Cognitive impairments: No apparent impairments                         Following commands: Intact       Cueing       General Comments       Exercises Total Joint Exercises Ankle Circles/Pumps: AROM, Both, 10 reps   Assessment/Plan    PT Assessment Patient needs continued PT services  PT Problem List Decreased strength;Decreased range of motion;Decreased activity tolerance;Decreased balance;Decreased mobility;Decreased knowledge of use of DME;Pain       PT Treatment Interventions DME instruction;Therapeutic activities;Gait training;Stair training;Functional mobility training;Therapeutic exercise;Patient/family education    PT Goals (Current goals can be found in the Care Plan section)  Acute Rehab PT Goals PT Goal Formulation: With patient Time For Goal Achievement: 12/26/23 Potential to Achieve Goals: Good    Frequency 7X/week     Co-evaluation               AM-PAC PT "6 Clicks" Mobility  Outcome Measure Help needed turning from your back to your side while in a flat bed without using bedrails?: A Little Help needed moving from lying on your back to sitting on the side of a flat bed without using bedrails?: A Little Help needed moving to and from a bed to a chair (including a wheelchair)?: A Little Help needed standing up from a chair using your arms (e.g., wheelchair or bedside chair)?: A Little Help needed to walk in hospital room?: A Little Help needed climbing 3-5 steps with a railing? : A Lot 6 Click Score: 17    End of Session Equipment Utilized During Treatment: Gait belt;Left knee immobilizer Activity Tolerance: Patient tolerated treatment well;Patient limited by pain Patient left: in chair;with call bell/phone within reach;with family/visitor present;with chair alarm set Nurse Communication: Mobility status PT Visit Diagnosis: Other abnormalities of gait and mobility (R26.89)    Time: 9528-4132 PT Time Calculation (min) (ACUTE ONLY): 33 min   Charges:   PT Evaluation $PT Eval Low Complexity: 1 Low   PT General Charges $$ ACUTE PT VISIT: 1 Visit         [name], PT  Acute Rehab Dept  [facility]) [phone]  12/20/2023   [facility] 12/20/2023, 11:58 AM

## 2023-12-20 NOTE — Care Management Obs Status (Signed)
 MEDICARE OBSERVATION STATUS NOTIFICATION   Patient Details  Name: [name] MRN: [id number] Date of Birth: 02/28/1956   Medicare Observation Status Notification Given:  Yes    Adalynne Steffensmeier Liane Redman, LCSW 12/20/2023, 12:19 PM

## 2023-12-20 NOTE — Plan of Care (Signed)

## 2023-12-20 NOTE — Discharge Instructions (Signed)
 Per [facility] clinic policy, our goal is ensure optimal postoperative pain control with a multimodal pain management strategy. For all [facility] patients, our goal is to wean post-operative narcotic medications by 6 weeks post-operatively. If this is not possible due to utilization of pain medication prior to surgery, your [facility] doctor will support your acute post-operative pain control for the first 6 weeks postoperatively, with a plan to transition you back to your primary pain team following that. [name] will work to ensure a Therapist, occupational.  INSTRUCTIONS AFTER JOINT REPLACEMENT   Remove items at home which could result in a fall. This includes throw rugs or furniture in walking pathways ICE to the affected joint every three hours while awake for 30 minutes at a time, for at least the first 3-5 days, and then as needed for pain and swelling.  Continue to use ice for pain and swelling. You may notice swelling that will progress down to the foot and ankle.  This is normal after surgery.  Elevate your leg when you are not up walking on it.   Continue to use the breathing machine you got in the hospital (incentive spirometer) which will help keep your temperature down.  It is common for your temperature to cycle up and down following surgery, especially at night when you are not up moving around and exerting yourself.  The breathing machine keeps your lungs expanded and your temperature down.   DIET:  As you were doing prior to hospitalization, we recommend a well-balanced diet.  DRESSING / WOUND CARE / SHOWERING  Keep the surgical dressing until follow up.  The dressing is water proof, so you can shower without any extra covering.  IF THE DRESSING FALLS OFF or the wound gets wet inside, change the dressing with sterile gauze.  Please use good hand washing techniques before changing the dressing.  Do not use any lotions or creams on the incision until instructed by your surgeon.     ACTIVITY  Increase activity slowly as tolerated, but follow the weight bearing instructions below.   No driving for 6 weeks or until further direction given by your physician.  You cannot drive while taking narcotics.  No lifting or carrying greater than 10 lbs. until further directed by your surgeon. Avoid periods of inactivity such as sitting longer than an hour when not asleep. This helps prevent blood clots.  You may return to work once you are authorized by your doctor.     WEIGHT BEARING   Weight bearing as tolerated with assist device (walker, cane, etc) as directed, use it as long as suggested by your surgeon or therapist, typically at least 4-6 weeks.   EXERCISES  Results after joint replacement surgery are often greatly improved when you follow the exercise, range of motion and muscle strengthening exercises prescribed by your doctor. Safety measures are also important to protect the joint from further injury. Any time any of these exercises cause you to have increased pain or swelling, decrease what you are doing until you are comfortable again and then slowly increase them. If you have problems or questions, call your caregiver or physical therapist for advice.   Rehabilitation is important following a joint replacement. After just a few days of immobilization, the muscles of the leg can become weakened and shrink (atrophy).  These exercises are designed to build up the tone and strength of the thigh and leg muscles and to improve motion. Often times heat used for twenty to thirty minutes before  working out will loosen up your tissues and help with improving the range of motion but do not use heat for the first two weeks following surgery (sometimes heat can increase post-operative swelling).   These exercises can be done on a training (exercise) mat, on the floor, on a table or on a bed. Use whatever works the best and is most comfortable for you.    Use music or television  while you are exercising so that the exercises are a pleasant break in your day. This will make your life better with the exercises acting as a break in your routine that you can look forward to.   Perform all exercises about fifteen times, three times per day or as directed.  You should exercise both the operative leg and the other leg as well.  Exercises include:   Quad Sets - Tighten up the muscle on the front of the thigh (Quad) and hold for 5-10 seconds.   Straight Leg Raises - With your knee straight (if you were given a brace, keep it on), lift the leg to 60 degrees, hold for 3 seconds, and slowly lower the leg.  Perform this exercise against resistance later as your leg gets stronger.  Leg Slides: Lying on your back, slowly slide your foot toward your buttocks, bending your knee up off the floor (only go as far as is comfortable). Then slowly slide your foot back down until your leg is flat on the floor again.  Angel Wings: Lying on your back spread your legs to the side as far apart as you can without causing discomfort.  Hamstring Strength:  Lying on your back, push your heel against the floor with your leg straight by tightening up the muscles of your buttocks.  Repeat, but this time bend your knee to a comfortable angle, and push your heel against the floor.  You may put a pillow under the heel to make it more comfortable if necessary.   A rehabilitation program following joint replacement surgery can speed recovery and prevent re-injury in the future due to weakened muscles. Contact your doctor or a physical therapist for more information on knee rehabilitation.    CONSTIPATION  Constipation is defined medically as fewer than three stools per week and severe constipation as less than one stool per week.  Even if you have a regular bowel pattern at home, your normal regimen is likely to be disrupted due to multiple reasons following surgery.  Combination of anesthesia, postoperative  narcotics, change in appetite and fluid intake all can affect your bowels.   YOU MUST use at least one of the following options; they are listed in order of increasing strength to get the job done.  They are all available over the counter, and you may need to use some, POSSIBLY even all of these options:    Drink plenty of fluids (prune juice may be helpful) and high fiber foods Colace 100 mg by mouth twice a day  Senokot for constipation as directed and as needed Dulcolax (bisacodyl), take with full glass of water  Miralax (polyethylene glycol) once or twice a day as needed.  If you have tried all these things and are unable to have a bowel movement in the first 3-4 days after surgery call either your surgeon or your primary doctor.    If you experience loose stools or diarrhea, hold the medications until you stool forms back up.  If your symptoms do not get better within 1 week  or if they get worse, check with your doctor.  If you experience "the worst abdominal pain ever" or develop nausea or vomiting, please contact the office immediately for further recommendations for treatment.   ITCHING:  If you experience itching with your medications, try taking only a single pain pill, or even half a pain pill at a time.  You can also use Benadryl over the counter for itching or also to help with sleep.   TED HOSE STOCKINGS:  Use stockings on both legs until for at least 2 weeks or as directed by physician office. They may be removed at night for sleeping.  MEDICATIONS:  See your medication summary on the "After Visit Summary" that nursing will review with you.  You may have some home medications which will be placed on hold until you complete the course of blood thinner medication.  It is important for you to complete the blood thinner medication as prescribed.  PRECAUTIONS:  If you experience chest pain or shortness of breath - call 911 immediately for transfer to the hospital emergency department.    If you develop a fever greater that 101 F, purulent drainage from wound, increased redness or drainage from wound, foul odor from the wound/dressing, or calf pain - CONTACT YOUR SURGEON.                                                   FOLLOW-UP APPOINTMENTS:  If you do not already have a post-op appointment, please call the office for an appointment to be seen by your surgeon.  Guidelines for how soon to be seen are listed in your "After Visit Summary", but are typically between 1-4 weeks after surgery.  OTHER INSTRUCTIONS:   Knee Replacement:  Do not place pillow under knee, focus on keeping the knee straight while resting. CPM instructions: 0-90 degrees, 2 hours in the morning, 2 hours in the afternoon, and 2 hours in the evening. Place foam block, curve side up under heel at all times except when in CPM or when walking.  DO NOT modify, tear, cut, or change the foam block in any way.  POST-OPERATIVE OPIOID TAPER INSTRUCTIONS: It is important to wean off of your opioid medication as soon as possible. If you do not need pain medication after your surgery it is ok to stop day one. Opioids include: Codeine, Hydrocodone(Norco, Vicodin), Oxycodone(Percocet, oxycontin) and hydromorphone amongst others.  Long term and even short term use of opiods can cause: Increased pain response Dependence Constipation Depression Respiratory depression And more.  Withdrawal symptoms can include Flu like symptoms Nausea, vomiting And more Techniques to manage these symptoms Hydrate well Eat regular healthy meals Stay active Use relaxation techniques(deep breathing, meditating, yoga) Do Not substitute Alcohol to help with tapering If you have been on opioids for less than two weeks and do not have pain than it is ok to stop all together.  Plan to wean off of opioids This plan should start within one week post op of your joint replacement. Maintain the same interval or time between taking each dose  and first decrease the dose.  Cut the total daily intake of opioids by one tablet each day Next start to increase the time between doses. The last dose that should be eliminated is the evening dose.   MAKE SURE YOU:  Understand these instructions.  Get help right away if you are not doing well or get worse.    Thank you for letting us be a part of your medical care team.  It is a privilege we respect greatly.  We hope these instructions will help you stay on track for a fast and full recovery!      Dental Antibiotics:  In most cases prophylactic antibiotics for Dental procdeures after total joint surgery are not necessary.  Exceptions are as follows:  1. History of prior total joint infection  2. Severely immunocompromised (Organ Transplant, cancer chemotherapy, Rheumatoid biologic meds such as Humera)  3. Poorly controlled diabetes (A1C &gt; 8.0, blood glucose over 200)  If you have one of these conditions, contact your surgeon for an antibiotic prescription, prior to your dental procedure.

## 2023-12-20 NOTE — Progress Notes (Signed)
 Physical Therapy Treatment Patient Details Name: [name] MRN: [id number] DOB: 05-17-1956 Today's Date: 12/20/2023   History of Present Illness 68 yo male s/p L TKA on 12/20/23. PMH: MDD, OSA, HTN, morbid obesity, CHF, burn scar contracture    PT Comments  Pt politely declined OOB d/t pain and feeling "woozy"; reviewed TKA exercises in bed, pt tol well. Will continue PT POC, pt and spouse hopeful for d/c tomorrow.    If plan is discharge home, recommend the following: A little help with walking and/or transfers;A little help with bathing/dressing/bathroom;Help with stairs or ramp for entrance;Assist for transportation;Assistance with cooking/housework   Can travel by private vehicle        Equipment Recommendations  None recommended by PT    Recommendations for Other Services       Precautions / Restrictions Precautions Precautions: Fall;Knee Recall of Precautions/Restrictions: Intact Required Braces or Orthoses: Knee Immobilizer - Left Restrictions Weight Bearing Restrictions Per Provider Order: No Other Position/Activity Restrictions: WBAT     Mobility  Bed Mobility Overal bed mobility: Needs Assistance Bed Mobility: Supine to Sit     Supine to sit: Min assist     General bed mobility comments: declined d/t pain--was premedicated 40 minutes prior to session    Transfers Overall transfer level: Needs assistance Equipment used: Rolling walker (2 wheels) Transfers: Sit to/from Stand Sit to Stand: Min assist, From elevated surface           General transfer comment: cues for hand placement and LLE Position    Ambulation/Gait Ambulation/Gait assistance: Min assist Gait Distance (Feet): 15 Feet Assistive device: Rolling walker (2 wheels) Gait Pattern/deviations: Step-to pattern, Decreased step length - right, Decreased step length - left, Decreased stance time - left       General Gait Details: cues for sequence, use of UEs to off load LLE   Stairs              Wheelchair Mobility     Tilt Bed    Modified Rankin (Stroke Patients Only)       Balance Overall balance assessment: Mild deficits observed, not formally tested                                          Communication Communication Communication: No apparent difficulties  Cognition Arousal: Alert Behavior During Therapy: WFL for tasks assessed/performed   PT - Cognitive impairments: No apparent impairments                         Following commands: Intact      Cueing    Exercises Total Joint Exercises Ankle Circles/Pumps: AROM, Both, 10 reps Quad Sets: AROM, Both, Strengthening, 10 reps Heel Slides: AAROM, Left, 10 reps Hip ABduction/ADduction: AAROM, Left, 10 reps Straight Leg Raises: AAROM, Left, 10 reps    General Comments        Pertinent Vitals/Pain Pain Assessment Pain Assessment: Faces Faces Pain Scale: Hurts even more Pain Location: left knee Pain Descriptors / Indicators: Aching, Sore Pain Intervention(s): Limited activity within patient's tolerance, Monitored during session, Premedicated before session, Repositioned, Ice applied    Home Living Family/patient expects to be discharged to:: Private residence Living Arrangements: Spouse/significant other Available Help at Discharge: Family Type of Home: Mobile home                  Prior  Function            PT Goals (current goals can now be found in the care plan section) Acute Rehab PT Goals PT Goal Formulation: With patient Time For Goal Achievement: 12/26/23 Potential to Achieve Goals: Good Progress towards PT goals: Progressing toward goals    Frequency    7X/week      PT Plan      Co-evaluation              AM-PAC PT "6 Clicks" Mobility   Outcome Measure  Help needed turning from your back to your side while in a flat bed without using bedrails?: A Little Help needed moving from lying on your back to sitting on the  side of a flat bed without using bedrails?: A Little Help needed moving to and from a bed to a chair (including a wheelchair)?: A Little Help needed standing up from a chair using your arms (e.g., wheelchair or bedside chair)?: A Little Help needed to walk in hospital room?: A Little Help needed climbing 3-5 steps with a railing? : A Lot 6 Click Score: 17    End of Session Equipment Utilized During Treatment: Gait belt;Left knee immobilizer Activity Tolerance: Patient tolerated treatment well Patient left: in bed;with call bell/phone within reach;with bed alarm set Nurse Communication: Mobility status PT Visit Diagnosis: Other abnormalities of gait and mobility (R26.89)     Time: 6045-4098 PT Time Calculation (min) (ACUTE ONLY): 22 min  Charges:    $Therapeutic Exercise: 8-22 mins PT General Charges $$ ACUTE PT VISIT: 1 Visit                     Alexandria Ida, PT  Acute Rehab Dept [facility]) [phone]  12/20/2023    [facility] 12/20/2023, 3:01 PM

## 2023-12-20 NOTE — Progress Notes (Signed)
   12/20/23 2152  BiPAP/CPAP/SIPAP  BiPAP/CPAP/SIPAP Pt Type Adult  BiPAP/CPAP/SIPAP Resmed  Mask Type Nasal mask  FiO2 (%) 21 %  Patient Home Machine No  Patient Home Mask Yes  Patient Home Tubing Yes  Auto Titrate Yes  Minimum cmH2O 11 cmH2O (Home setting)  Maximum cmH2O 18 cmH2O (home setting)  Device Plugged into RED Power Outlet Yes

## 2023-12-20 NOTE — Progress Notes (Signed)
 Subjective: 1 Day Post-Op Procedure(s) (LRB): ARTHROPLASTY, KNEE, TOTAL (Left) Patient reports pain as moderate.    Objective: Vital signs in last 24 hours: Temp:  [97.4 F (36.3 C)-99 F (37.2 C)] 98.4 F (36.9 C) (04/26 0625) Pulse Rate:  [49-85] 79 (04/26 1036) Resp:  [10-20] 20 (04/26 1036) BP: (90-151)/(59-83) 128/80 (04/26 1036) SpO2:  [85 %-100 %] 91 % (04/26 1036)  Intake/Output from previous day: 04/25 0701 - 04/26 0700 In: 2284.5 [P.O.:357; I.V.:1677.5; IV Piggyback:250] Out: 25 [Blood:25] Intake/Output this shift: No intake/output data recorded.  Recent Labs    12/20/23 0356  HGB 14.7   Recent Labs    12/20/23 0356  WBC 13.2*  RBC 5.16  HCT 48.0  PLT 187   Recent Labs    12/20/23 0356  NA 138  K 4.4  CL 102  CO2 25  BUN 15  CREATININE 0.77  GLUCOSE 126*  CALCIUM  8.6*   No results for input(s): "LABPT", "INR" in the last 72 hours.  Sensation intact distally Intact pulses distally Dorsiflexion/Plantar flexion intact Incision: dressing C/D/I Compartment soft   Assessment/Plan: 1 Day Post-Op Procedure(s) (LRB): ARTHROPLASTY, KNEE, TOTAL (Left) Up with therapy Plan for discharge tomorrow Discharge home with home health      Arnie Lao 12/20/2023, 11:10 AM

## 2023-12-21 DIAGNOSIS — M1712 Unilateral primary osteoarthritis, left knee: Secondary | ICD-10-CM | POA: Diagnosis not present

## 2023-12-21 NOTE — Progress Notes (Addendum)
 Physical Therapy Treatment Patient Details Name: [name] MRN: [id number] DOB: May 16, 1956 Today's Date: 12/21/2023   History of Present Illness 68 yo male s/p L TKA on 12/20/23. PMH: MDD, OSA, HTN, morbid obesity, CHF, burn scar contracture    PT Comments  Pt progressing slowly; limited by pain and nausea, reports he did not eat breakfast and took meds on empty stomach, dizzy initially sitting EOB--resolved however pt was OOB for only ~ 45 minutes in total yesterday; encouraged pt to be OOB as much as possible. RN aware   If plan is discharge home, recommend the following: A little help with walking and/or transfers;A little help with bathing/dressing/bathroom;Help with stairs or ramp for entrance;Assist for transportation;Assistance with cooking/housework   Can travel by private vehicle        Equipment Recommendations  None recommended by PT    Recommendations for Other Services       Precautions / Restrictions Precautions Precautions: Fall;Knee Recall of Precautions/Restrictions: Intact Required Braces or Orthoses: Knee Immobilizer - Left Restrictions Weight Bearing Restrictions Per Provider Order: No Other Position/Activity Restrictions: WBAT     Mobility  Bed Mobility Overal bed mobility: Needs Assistance Bed Mobility: Supine to Sit     Supine to sit: Min assist, HOB elevated     General bed mobility comments: assist with RLE, pt able to self assist with gait belt, incr time and effort    Transfers Overall transfer level: Needs assistance Equipment used: Rolling walker (2 wheels) Transfers: Sit to/from Stand, Bed to chair/wheelchair/BSC Sit to Stand: Min assist, From elevated surface   Step pivot transfers: Min assist       General transfer comment: cues for hand placement and LLE Position; pt took a few steps, c/o nausea and fatigue--provided seated rest    Ambulation/Gait                   Stairs             Wheelchair Mobility      Tilt Bed    Modified Rankin (Stroke Patients Only)       Balance                                            Communication Communication Communication: No apparent difficulties  Cognition Arousal: Alert Behavior During Therapy: WFL for tasks assessed/performed   PT - Cognitive impairments: No apparent impairments                         Following commands: Intact      Cueing Cueing Techniques: Verbal cues  Exercises      General Comments        Pertinent Vitals/Pain Pain Assessment Pain Assessment: Faces Faces Pain Scale: Hurts even more Pain Location: left knee Pain Descriptors / Indicators: Aching, Sore Pain Intervention(s): Limited activity within patient's tolerance, Monitored during session, Premedicated before session, Repositioned    Home Living                          Prior Function            PT Goals (current goals can now be found in the care plan section) Acute Rehab PT Goals PT Goal Formulation: With patient Time For Goal Achievement: 12/26/23 Potential to Achieve Goals: Good Progress towards PT goals:  Not progressing toward goals - comment (nausea)    Frequency    7X/week      PT Plan      Co-evaluation              AM-PAC PT "6 Clicks" Mobility   Outcome Measure  Help needed turning from your back to your side while in a flat bed without using bedrails?: A Little Help needed moving from lying on your back to sitting on the side of a flat bed without using bedrails?: A Little Help needed moving to and from a bed to a chair (including a wheelchair)?: A Lot Help needed standing up from a chair using your arms (e.g., wheelchair or bedside chair)?: A Lot Help needed to walk in hospital room?: A Lot Help needed climbing 3-5 steps with a railing? : Total 6 Click Score: 13    End of Session Equipment Utilized During Treatment: Gait belt;Left knee immobilizer Activity Tolerance:  Treatment limited secondary to medical complications (Comment) (nausea and dizziness) Patient left: with call bell/phone within reach;in chair;with chair alarm set;with family/visitor present Nurse Communication: Mobility status PT Visit Diagnosis: Other abnormalities of gait and mobility (R26.89)     Time: 4098-1191 PT Time Calculation (min) (ACUTE ONLY): 17 min  Charges:    $Therapeutic Activity: 8-22 mins PT General Charges $$ ACUTE PT VISIT: 1 Visit                     Alexandria Ida, PT  Acute Rehab Dept [facility]) [phone]  12/21/2023    [facility] 12/21/2023, 10:54 AM

## 2023-12-21 NOTE — Progress Notes (Signed)
 Physical Therapy Treatment Patient Details Name: [name] MRN: [id number] DOB: 05/08/1956 Today's Date: 12/21/2023   History of Present Illness 68 yo male s/p L TKA on 12/20/23. PMH: MDD, OSA, HTN, morbid obesity, CHF, burn scar contracture    PT Comments  Pt progressing slowly but steadily. With encouragement able to amb incr distance of 30' with improved wt shift to RLE during distance. Pt requesting to return to bed  after amb. Continue PT POC. Will need to be able to ascend 2 steps, will likely be ready to attempt stairs next session and d/c tomorrow    If plan is discharge home, recommend the following: A little help with walking and/or transfers;A little help with bathing/dressing/bathroom;Help with stairs or ramp for entrance;Assist for transportation;Assistance with cooking/housework   Can travel by private vehicle        Equipment Recommendations  None recommended by PT    Recommendations for Other Services       Precautions / Restrictions Precautions Precautions: Fall;Knee Recall of Precautions/Restrictions: Intact Precaution/Restrictions Comments: KI not utilized this pm Required Braces or Orthoses: Knee Immobilizer - Left Restrictions Weight Bearing Restrictions Per Provider Order: No Other Position/Activity Restrictions: WBAT     Mobility  Bed Mobility Overal bed mobility: Needs Assistance Bed Mobility: Supine to Sit, Sit to Supine     Supine to sit: Min assist, HOB elevated, Contact guard Sit to supine: Min assist   General bed mobility comments: assist with RLE, pt able to self assist with gait belt to EOB, assist to lower and lift from floor; incr time and effort    Transfers Overall transfer level: Needs assistance Equipment used: Rolling walker (2 wheels) Transfers: Sit to/from Stand, Bed to chair/wheelchair/BSC Sit to Stand: Min assist   Step pivot transfers: Min assist       General transfer comment: cues for hand placement and LE  Position; incr time. STS from bed and recliner    Ambulation/Gait Ambulation/Gait assistance: Contact guard assist, Min assist Gait Distance (Feet): 30 Feet Assistive device: Rolling walker (2 wheels) Gait Pattern/deviations: Step-to pattern, Decreased step length - right, Decreased step length - left, Decreased stance time - left, Antalgic       General Gait Details: cues for sequence, use of UEs to off load LLE   Stairs             Wheelchair Mobility     Tilt Bed    Modified Rankin (Stroke Patients Only)       Balance                                            Communication Communication Communication: No apparent difficulties  Cognition Arousal: Alert Behavior During Therapy: WFL for tasks assessed/performed   PT - Cognitive impairments: No apparent impairments                         Following commands: Intact      Cueing Cueing Techniques: Verbal cues  Exercises Total Joint Exercises Ankle Circles/Pumps: AROM, Both, 10 reps Quad Sets: AROM, Both, Strengthening, 10 reps Heel Slides: AAROM, Left, 10 reps    General Comments        Pertinent Vitals/Pain Pain Assessment Pain Assessment: Faces Faces Pain Scale: Hurts even more Pain Location: left knee Pain Descriptors / Indicators: Aching, Sore Pain Intervention(s): Limited activity within patient's  tolerance, Monitored during session, Premedicated before session, Repositioned, Ice applied    Home Living                          Prior Function            PT Goals (current goals can now be found in the care plan section) Acute Rehab PT Goals PT Goal Formulation: With patient Time For Goal Achievement: 12/26/23 Potential to Achieve Goals: Good Progress towards PT goals: Progressing toward goals    Frequency    7X/week      PT Plan      Co-evaluation              AM-PAC PT "6 Clicks" Mobility   Outcome Measure  Help needed turning  from your back to your side while in a flat bed without using bedrails?: A Little Help needed moving from lying on your back to sitting on the side of a flat bed without using bedrails?: A Little Help needed moving to and from a bed to a chair (including a wheelchair)?: A Lot Help needed standing up from a chair using your arms (e.g., wheelchair or bedside chair)?: A Little Help needed to walk in hospital room?: A Little Help needed climbing 3-5 steps with a railing? : Total 6 Click Score: 15    End of Session Equipment Utilized During Treatment: Gait belt Activity Tolerance: Patient tolerated treatment well;Patient limited by pain;Patient limited by fatigue (nausea and dizziness) Patient left: with call bell/phone within reach;in chair;with chair alarm set;with family/visitor present Nurse Communication: Mobility status PT Visit Diagnosis: Other abnormalities of gait and mobility (R26.89)     Time: 2725-3664 PT Time Calculation (min) (ACUTE ONLY): 28 min  Charges:    $Gait Training: 8-22 mins $Therapeutic Exercise: 8-22 mins PT General Charges $$ ACUTE PT VISIT: 1 Visit                     Jakaiden Fill, PT  Acute Rehab Dept ([facility]) [phone]  12/21/2023    [facility] 12/21/2023, 3:22 PM

## 2023-12-21 NOTE — Progress Notes (Signed)
 Subjective: 2 Days Post-Op Procedure(s) (LRB): ARTHROPLASTY, KNEE, TOTAL (Left) Patient reports pain as moderate.  No complaints other than pain this am.   Objective: Vital signs in last 24 hours: Temp:  [97.4 F (36.3 C)-98.5 F (36.9 C)] 97.8 F (36.6 C) (04/27 0615) Pulse Rate:  [73-79] 73 (04/27 0615) Resp:  [17-20] 17 (04/27 0615) BP: (125-145)/(70-80) 145/79 (04/27 0615) SpO2:  [91 %-95 %] 95 % (04/27 0615) FiO2 (%):  [21 %] 21 % (04/26 2152)  Intake/Output from previous day: 04/26 0701 - 04/27 0700 In: 340 [P.O.:340] Out: 950 [Urine:950] Intake/Output this shift: No intake/output data recorded.  Recent Labs    12/20/23 0356  HGB 14.7   Recent Labs    12/20/23 0356  WBC 13.2*  RBC 5.16  HCT 48.0  PLT 187   Recent Labs    12/20/23 0356  NA 138  K 4.4  CL 102  CO2 25  BUN 15  CREATININE 0.77  GLUCOSE 126*  CALCIUM  8.6*   No results for input(s): "LABPT", "INR" in the last 72 hours.  Neurologically intact Neurovascular intact Sensation intact distally Intact pulses distally Dorsiflexion/Plantar flexion intact Incision: scant drainage No cellulitis present Compartment soft   Assessment/Plan: 2 Days Post-Op Procedure(s) (LRB): ARTHROPLASTY, KNEE, TOTAL (Left) Advance diet Up with therapy D/C IV fluids Discharge home with home health once cleared by PT (hopeful for this afternoon) WBAT LLE      Sandie Cross 12/21/2023, 9:42 AM
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
## 2023-12-21 NOTE — Plan of Care (Signed)
  Problem: Elimination: Goal: Will not experience complications related to urinary retention Outcome: Progressing   Problem: Activity: Goal: Range of joint motion will improve Outcome: Progressing   Problem: Clinical Measurements: Goal: Postoperative complications will be avoided or minimized Outcome: Progressing   Problem: Pain Management: Goal: Pain level will decrease with appropriate interventions Outcome: Progressing

## 2023-12-22 ENCOUNTER — Encounter (HOSPITAL_COMMUNITY): Payer: Self-pay | Admitting: Orthopaedic Surgery

## 2023-12-22 DIAGNOSIS — M1712 Unilateral primary osteoarthritis, left knee: Secondary | ICD-10-CM | POA: Diagnosis not present

## 2023-12-22 NOTE — Plan of Care (Signed)
  Problem: Nutrition: Goal: Adequate nutrition will be maintained Outcome: Progressing   Problem: Pain Managment: Goal: General experience of comfort will improve and/or be controlled Outcome: Progressing   Problem: Activity: Goal: Ability to avoid complications of mobility impairment will improve Outcome: Progressing Goal: Range of joint motion will improve Outcome: Progressing   Problem: Safety: Goal: Ability to remain free from injury will improve Outcome: Progressing
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
## 2023-12-22 NOTE — Progress Notes (Signed)
   12/21/23 2300  BiPAP/CPAP/SIPAP  BiPAP/CPAP/SIPAP Pt Type Adult  BiPAP/CPAP/SIPAP Resmed  Mask Type Nasal mask  Dentures removed? Not applicable  FiO2 (%) 21 %  Patient Home Machine No  Patient Home Mask Yes  Patient Home Tubing Yes  Auto Titrate Yes  Minimum cmH2O 11 cmH2O  Maximum cmH2O 18 cmH2O  CPAP/SIPAP surface wiped down Yes  Device Plugged into RED Power Outlet Yes
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
## 2023-12-22 NOTE — Progress Notes (Signed)
 Discharge package printed and instructions given to pt and wife. Pt verbalizes understanding.

## 2023-12-22 NOTE — TOC Progression Note (Signed)
 Transition of Care [facility]) - Progression Note    Patient Details  Name: [name] MRN: [id number] Date of Birth: 1956/02/11  Transition of Care [facility]) CM/SW Contact  Bari Leys, RN Phone Number: 12/22/2023, 9:38 AM  Clinical Narrative:   Met with pt/spouse at bedside to review dc therapy and home equipment needs, spouse confirmed HH PT with [facility], has RW, no home DME needs. No TOC needs.       Barriers to Discharge: No Barriers Identified  Expected Discharge Plan and Services         Expected Discharge Date: 12/22/23                                     Social Determinants of Health (SDOH) Interventions SDOH Screenings   Food Insecurity: No Food Insecurity (12/19/2023)  Housing: Low Risk  (12/19/2023)  Transportation Needs: No Transportation Needs (12/19/2023)  Utilities: Not At Risk (12/19/2023)  Alcohol Screen: Low Risk  (10/03/2023)  Depression (PHQ2-9): High Risk (10/06/2023)  Financial Resource Strain: Low Risk  (10/03/2023)  Physical Activity: Unknown (10/03/2023)  Recent Concern: Physical Activity - Inactive (10/03/2023)  Social Connections: Socially Integrated (12/19/2023)  Stress: Stress Concern Present (10/03/2023)  Tobacco Use: Medium Risk (12/19/2023)    Readmission Risk Interventions     No data to display

## 2023-12-22 NOTE — Progress Notes (Signed)
 Patient ID: [name], male   DOB: 1956/03/14, 68 y.o.   MRN: [id number] The patient is awake and alert this morning.  His wife is with him as well.  His main complaint is not being able to lift his left leg.  However his motor and sensory function distally of the foot and ankle were normal.  His incision and dressing are clean and dry and his calf is soft on the left knee.  I did review the notes from physical therapy and he is making progress.  The goal is hopefully for discharge to home today after another therapy session.  They agreed with this treatment plan.

## 2023-12-24 DIAGNOSIS — G510 Bell's palsy: Secondary | ICD-10-CM | POA: Diagnosis not present

## 2023-12-24 DIAGNOSIS — I110 Hypertensive heart disease with heart failure: Secondary | ICD-10-CM | POA: Diagnosis not present

## 2023-12-24 DIAGNOSIS — D6869 Other thrombophilia: Secondary | ICD-10-CM | POA: Diagnosis not present

## 2023-12-24 DIAGNOSIS — I5032 Chronic diastolic (congestive) heart failure: Secondary | ICD-10-CM | POA: Diagnosis not present

## 2023-12-24 DIAGNOSIS — K219 Gastro-esophageal reflux disease without esophagitis: Secondary | ICD-10-CM | POA: Diagnosis not present

## 2023-12-24 DIAGNOSIS — N400 Enlarged prostate without lower urinary tract symptoms: Secondary | ICD-10-CM | POA: Diagnosis not present

## 2023-12-24 DIAGNOSIS — E538 Deficiency of other specified B group vitamins: Secondary | ICD-10-CM | POA: Diagnosis not present

## 2023-12-24 DIAGNOSIS — H903 Sensorineural hearing loss, bilateral: Secondary | ICD-10-CM | POA: Diagnosis not present

## 2023-12-24 DIAGNOSIS — K5909 Other constipation: Secondary | ICD-10-CM | POA: Diagnosis not present

## 2023-12-24 DIAGNOSIS — E88819 Insulin resistance, unspecified: Secondary | ICD-10-CM | POA: Diagnosis not present

## 2023-12-24 DIAGNOSIS — G4733 Obstructive sleep apnea (adult) (pediatric): Secondary | ICD-10-CM | POA: Diagnosis not present

## 2023-12-24 DIAGNOSIS — G8929 Other chronic pain: Secondary | ICD-10-CM | POA: Diagnosis not present

## 2023-12-24 DIAGNOSIS — Z471 Aftercare following joint replacement surgery: Secondary | ICD-10-CM | POA: Diagnosis not present

## 2023-12-24 DIAGNOSIS — E039 Hypothyroidism, unspecified: Secondary | ICD-10-CM | POA: Diagnosis not present

## 2023-12-24 DIAGNOSIS — D692 Other nonthrombocytopenic purpura: Secondary | ICD-10-CM | POA: Diagnosis not present

## 2023-12-24 DIAGNOSIS — F419 Anxiety disorder, unspecified: Secondary | ICD-10-CM | POA: Diagnosis not present

## 2023-12-24 DIAGNOSIS — G709 Myoneural disorder, unspecified: Secondary | ICD-10-CM | POA: Diagnosis not present

## 2023-12-24 DIAGNOSIS — E782 Mixed hyperlipidemia: Secondary | ICD-10-CM | POA: Diagnosis not present

## 2023-12-24 DIAGNOSIS — I2510 Atherosclerotic heart disease of native coronary artery without angina pectoris: Secondary | ICD-10-CM | POA: Diagnosis not present

## 2023-12-24 DIAGNOSIS — J309 Allergic rhinitis, unspecified: Secondary | ICD-10-CM | POA: Diagnosis not present

## 2023-12-24 DIAGNOSIS — E559 Vitamin D deficiency, unspecified: Secondary | ICD-10-CM | POA: Diagnosis not present

## 2023-12-24 DIAGNOSIS — M4802 Spinal stenosis, cervical region: Secondary | ICD-10-CM | POA: Diagnosis not present

## 2023-12-24 DIAGNOSIS — M1711 Unilateral primary osteoarthritis, right knee: Secondary | ICD-10-CM | POA: Diagnosis not present

## 2023-12-24 DIAGNOSIS — F330 Major depressive disorder, recurrent, mild: Secondary | ICD-10-CM | POA: Diagnosis not present

## 2023-12-26 ENCOUNTER — Other Ambulatory Visit: Payer: Self-pay | Admitting: *Deleted

## 2023-12-26 ENCOUNTER — Telehealth: Payer: Self-pay | Admitting: *Deleted

## 2023-12-26 ENCOUNTER — Other Ambulatory Visit: Payer: Self-pay | Admitting: Orthopaedic Surgery

## 2023-12-26 DIAGNOSIS — Z96652 Presence of left artificial knee joint: Secondary | ICD-10-CM

## 2023-12-26 DIAGNOSIS — M1712 Unilateral primary osteoarthritis, left knee: Secondary | ICD-10-CM

## 2023-12-26 MED ORDER — OXYCODONE HCL 5 MG PO TABS
5.0000 mg | ORAL_TABLET | Freq: Four times a day (QID) | ORAL | 0 refills | Status: DC | PRN
Start: 2023-12-26 — End: 2024-01-01

## 2023-12-26 NOTE — Telephone Encounter (Signed)
 Patient called requesting refill of pain medication. Pharmacy is [facility] [address] Thank you.

## 2024-01-01 ENCOUNTER — Ambulatory Visit: Admitting: Orthopaedic Surgery

## 2024-01-01 ENCOUNTER — Encounter: Payer: Self-pay | Admitting: Orthopaedic Surgery

## 2024-01-01 DIAGNOSIS — Z96652 Presence of left artificial knee joint: Secondary | ICD-10-CM

## 2024-01-01 MED ORDER — OXYCODONE HCL 5 MG PO TABS: 5.0000 mg | ORAL_TABLET | Freq: Four times a day (QID) | ORAL | 0 refills | Status: DC | PRN

## 2024-01-01 MED ORDER — TIZANIDINE HCL 4 MG PO TABS: 4.0000 mg | ORAL_TABLET | Freq: Four times a day (QID) | ORAL | 0 refills | Status: DC | PRN

## 2024-01-01 NOTE — Progress Notes (Signed)
 The patient is here for his first postoperative visit status post a left total knee replacement.  He is doing well overall with home therapy and is transitioning to outpatient physical therapy tomorrow.  He reports improved range of motion and strength.  He has been compliant with a baby aspirin  twice daily.  His left knee incision looks good.  Staples were removed and Steri-Strips applied.  His calf is soft.  On exam his knee lacks full extension by about 3 degrees and I can flex him to almost 90 degrees.  He will start outpatient physical therapy and we will see him back around a month to see what his motion is like.  No x-rays are needed.  I will send in more oxycodone  and Zanaflex .

## 2024-01-05 ENCOUNTER — Telehealth: Payer: Self-pay | Admitting: *Deleted

## 2024-01-05 ENCOUNTER — Other Ambulatory Visit: Payer: Self-pay | Admitting: Orthopaedic Surgery

## 2024-01-05 ENCOUNTER — Other Ambulatory Visit: Payer: Self-pay

## 2024-01-05 DIAGNOSIS — E291 Testicular hypofunction: Secondary | ICD-10-CM

## 2024-01-05 MED ORDER — OXYCODONE HCL 5 MG PO TABS: 5.0000 mg | ORAL_TABLET | Freq: Four times a day (QID) | ORAL | 0 refills | Status: DC | PRN

## 2024-01-05 NOTE — Telephone Encounter (Signed)
 Just FYI.

## 2024-01-05 NOTE — Telephone Encounter (Signed)
 Patient called requesting a refill of pain medication. Thank you.

## 2024-01-06 ENCOUNTER — Ambulatory Visit: Admitting: Physical Therapy

## 2024-01-06 ENCOUNTER — Other Ambulatory Visit

## 2024-01-06 ENCOUNTER — Encounter: Payer: Self-pay | Admitting: Physical Therapy

## 2024-01-06 DIAGNOSIS — R600 Localized edema: Secondary | ICD-10-CM

## 2024-01-06 DIAGNOSIS — M25562 Pain in left knee: Secondary | ICD-10-CM | POA: Diagnosis not present

## 2024-01-06 DIAGNOSIS — G8929 Other chronic pain: Secondary | ICD-10-CM

## 2024-01-06 DIAGNOSIS — M6281 Muscle weakness (generalized): Secondary | ICD-10-CM

## 2024-01-06 DIAGNOSIS — R2689 Other abnormalities of gait and mobility: Secondary | ICD-10-CM

## 2024-01-06 DIAGNOSIS — M25662 Stiffness of left knee, not elsewhere classified: Secondary | ICD-10-CM
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
## 2024-01-07 ENCOUNTER — Ambulatory Visit: Admitting: Physical Therapy

## 2024-01-07 ENCOUNTER — Encounter: Payer: Self-pay | Admitting: Physical Therapy

## 2024-01-07 DIAGNOSIS — G8929 Other chronic pain: Secondary | ICD-10-CM | POA: Diagnosis not present

## 2024-01-07 DIAGNOSIS — R2689 Other abnormalities of gait and mobility: Secondary | ICD-10-CM

## 2024-01-07 DIAGNOSIS — M25562 Pain in left knee: Secondary | ICD-10-CM | POA: Diagnosis not present

## 2024-01-07 DIAGNOSIS — M6281 Muscle weakness (generalized): Secondary | ICD-10-CM

## 2024-01-07 DIAGNOSIS — R600 Localized edema: Secondary | ICD-10-CM

## 2024-01-07 DIAGNOSIS — M25662 Stiffness of left knee, not elsewhere classified: Secondary | ICD-10-CM | POA: Diagnosis not present
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
## 2024-01-08 ENCOUNTER — Encounter: Payer: Self-pay | Admitting: Rehabilitative and Restorative Service Providers"

## 2024-01-08 ENCOUNTER — Ambulatory Visit: Admitting: Rehabilitative and Restorative Service Providers"

## 2024-01-08 DIAGNOSIS — G8929 Other chronic pain: Secondary | ICD-10-CM

## 2024-01-08 DIAGNOSIS — R2689 Other abnormalities of gait and mobility: Secondary | ICD-10-CM | POA: Diagnosis not present

## 2024-01-08 DIAGNOSIS — R600 Localized edema: Secondary | ICD-10-CM

## 2024-01-08 DIAGNOSIS — M25662 Stiffness of left knee, not elsewhere classified: Secondary | ICD-10-CM

## 2024-01-08 DIAGNOSIS — M6281 Muscle weakness (generalized): Secondary | ICD-10-CM

## 2024-01-08 DIAGNOSIS — M25562 Pain in left knee: Secondary | ICD-10-CM
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
## 2024-01-12 ENCOUNTER — Ambulatory Visit: Admitting: Physical Therapy

## 2024-01-12 ENCOUNTER — Telehealth: Payer: Self-pay | Admitting: Orthopaedic Surgery

## 2024-01-12 ENCOUNTER — Encounter: Payer: Self-pay | Admitting: Physical Therapy

## 2024-01-12 ENCOUNTER — Other Ambulatory Visit: Payer: Self-pay | Admitting: Orthopaedic Surgery

## 2024-01-12 DIAGNOSIS — R600 Localized edema: Secondary | ICD-10-CM | POA: Diagnosis not present

## 2024-01-12 DIAGNOSIS — M25562 Pain in left knee: Secondary | ICD-10-CM | POA: Diagnosis not present

## 2024-01-12 DIAGNOSIS — G8929 Other chronic pain: Secondary | ICD-10-CM | POA: Diagnosis not present

## 2024-01-12 DIAGNOSIS — R2689 Other abnormalities of gait and mobility: Secondary | ICD-10-CM | POA: Diagnosis not present

## 2024-01-12 DIAGNOSIS — M6281 Muscle weakness (generalized): Secondary | ICD-10-CM

## 2024-01-12 DIAGNOSIS — M25662 Stiffness of left knee, not elsewhere classified: Secondary | ICD-10-CM

## 2024-01-12 MED ORDER — OXYCODONE HCL 5 MG PO TABS: 5.0000 mg | ORAL_TABLET | Freq: Four times a day (QID) | ORAL | 0 refills | Status: DC | PRN
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
## 2024-01-12 NOTE — Telephone Encounter (Signed)
 Patient called and needs a refill on the Oxycodone . [phone]

## 2024-01-13 ENCOUNTER — Encounter: Payer: Self-pay | Admitting: Physical Therapy

## 2024-01-13 ENCOUNTER — Ambulatory Visit: Admitting: Physical Therapy

## 2024-01-13 DIAGNOSIS — M25562 Pain in left knee: Secondary | ICD-10-CM

## 2024-01-13 DIAGNOSIS — M25662 Stiffness of left knee, not elsewhere classified: Secondary | ICD-10-CM | POA: Diagnosis not present

## 2024-01-13 DIAGNOSIS — M6281 Muscle weakness (generalized): Secondary | ICD-10-CM

## 2024-01-13 DIAGNOSIS — G8929 Other chronic pain: Secondary | ICD-10-CM

## 2024-01-13 DIAGNOSIS — R600 Localized edema: Secondary | ICD-10-CM
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
## 2024-01-15 ENCOUNTER — Ambulatory Visit: Admitting: Physical Therapy

## 2024-01-15 ENCOUNTER — Encounter (INDEPENDENT_AMBULATORY_CARE_PROVIDER_SITE_OTHER): Payer: Self-pay

## 2024-01-15 ENCOUNTER — Ambulatory Visit (INDEPENDENT_AMBULATORY_CARE_PROVIDER_SITE_OTHER): Admitting: Internal Medicine

## 2024-01-15 ENCOUNTER — Encounter: Payer: Self-pay | Admitting: Physical Therapy

## 2024-01-15 DIAGNOSIS — M25562 Pain in left knee: Secondary | ICD-10-CM | POA: Diagnosis not present

## 2024-01-15 DIAGNOSIS — R600 Localized edema: Secondary | ICD-10-CM

## 2024-01-15 DIAGNOSIS — M6281 Muscle weakness (generalized): Secondary | ICD-10-CM | POA: Diagnosis not present

## 2024-01-15 DIAGNOSIS — G8929 Other chronic pain: Secondary | ICD-10-CM | POA: Diagnosis not present

## 2024-01-15 DIAGNOSIS — M25662 Stiffness of left knee, not elsewhere classified: Secondary | ICD-10-CM | POA: Diagnosis not present

## 2024-01-15 DIAGNOSIS — R2689 Other abnormalities of gait and mobility: Secondary | ICD-10-CM
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
## 2024-01-16 ENCOUNTER — Other Ambulatory Visit: Payer: Self-pay | Admitting: Orthopaedic Surgery

## 2024-01-16 ENCOUNTER — Telehealth: Payer: Self-pay | Admitting: *Deleted

## 2024-01-16 MED ORDER — TIZANIDINE HCL 4 MG PO TABS: 4.0000 mg | ORAL_TABLET | Freq: Four times a day (QID) | ORAL | 0 refills | Status: DC | PRN

## 2024-01-16 MED ORDER — OXYCODONE HCL 5 MG PO TABS: 5.0000 mg | ORAL_TABLET | Freq: Four times a day (QID) | ORAL | 0 refills | Status: DC | PRN

## 2024-01-16 NOTE — Telephone Encounter (Signed)
 Patient called requesting refill of pain medication before the 3 day holiday weekend. Thank you,

## 2024-01-18 ENCOUNTER — Other Ambulatory Visit: Payer: Self-pay

## 2024-01-18 MED FILL — Trazodone HCl Tab 100 MG: ORAL | 90 days supply | Qty: 180 | Fill #1 | Status: CN

## 2024-01-20 ENCOUNTER — Ambulatory Visit: Admitting: Physical Therapy

## 2024-01-20 ENCOUNTER — Encounter: Payer: Self-pay | Admitting: Physical Therapy

## 2024-01-20 ENCOUNTER — Ambulatory Visit (INDEPENDENT_AMBULATORY_CARE_PROVIDER_SITE_OTHER): Admitting: Clinical

## 2024-01-20 DIAGNOSIS — M25662 Stiffness of left knee, not elsewhere classified: Secondary | ICD-10-CM | POA: Diagnosis not present

## 2024-01-20 DIAGNOSIS — G8929 Other chronic pain: Secondary | ICD-10-CM

## 2024-01-20 DIAGNOSIS — F419 Anxiety disorder, unspecified: Secondary | ICD-10-CM

## 2024-01-20 DIAGNOSIS — R2689 Other abnormalities of gait and mobility: Secondary | ICD-10-CM | POA: Diagnosis not present

## 2024-01-20 DIAGNOSIS — R600 Localized edema: Secondary | ICD-10-CM

## 2024-01-20 DIAGNOSIS — F331 Major depressive disorder, recurrent, moderate: Secondary | ICD-10-CM | POA: Diagnosis not present

## 2024-01-20 DIAGNOSIS — M6281 Muscle weakness (generalized): Secondary | ICD-10-CM | POA: Diagnosis not present

## 2024-01-20 DIAGNOSIS — M25562 Pain in left knee: Secondary | ICD-10-CM
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
## 2024-01-20 NOTE — Progress Notes (Signed)
   Kevin Barthel, LCSW

## 2024-01-20 NOTE — Progress Notes (Signed)
 [facility] Counselor/Therapist Progress Note  Patient ID: [name], MRN: [id number],    Date: 01/20/2024  Time Spent: 8:36am - 9:25am : 49 minutes   Treatment Type: Individual Therapy  Reported Symptoms: depressed mood  Mental Status Exam: Appearance:  Neat and Well Groomed     Behavior: Appropriate  Motor: Normal  Speech/Language:  Clear and Coherent and Normal Rate  Affect: Appropriate  Mood: depressed  Thought process: normal  Thought content:   WNL  Sensory/Perceptual disturbances:   WNL  Orientation: oriented to person, place, time/date, and situation  Attention: Good  Concentration: Good  Memory: WNL  Fund of knowledge:  Good  Insight:   Good  Judgment:  Good  Impulse Control: Good   Risk Assessment: Danger to Self:  No Patient denied current suicidal ideation  Self-injurious Behavior: No Danger to Others: No Patient denied current homicidal ideation Duty to Warn:no Physical Aggression / Violence:No  Access to Firearms a concern: No  Gang Involvement:No   Subjective: Patient stated, "things started pretty good about a week after the surgery". Patient reported experiencing challenges after recent surgery. Patient reported a delay in receiving pain medication after surgery and a delay in receiving physical therapy. Patient reported he received physical therapy in the home after patient was discharged from the hospital and is currently receiving outpatient physical therapy. Patient stated, "I was very surprised at my attitude" in response to recent hospitalization and aftercare. Patient reported he did not become angry in response to challenges during recent hospitalization and aftercare. Patient stated, "I think I'm making good success"  in reference to patient's recovery from surgery. Patient reported he feels being able to disclose his thoughts/feelings in therapy impacted patient's response to recent hospitalization. Patient reported in the past  patient became angry in response to challenges during surgeries/hospitalizations. Patient stated, "I've never liked to ask for help". Patient stated, "I feel like I'm telling people I can't do something".  Patient stated, "my mood has been a little depressed lately". Patient reported after physical therapy patient is required to sit for several hours with ice on patient's knee and patient's activities are limited. Patient reported sitting and limited activities contribute to depressed mood.   Interventions: Cognitive Behavioral Therapy and supportive therapy. Clinician conducted session in person at clinician's office at [facility]. Reviewed events since last session and assessed for changes. Provided supportive therapy and active listening as patient discussed recent surgery/hospitalization and patient's response to surgery/hospitalization. Explored differences in patient's perspective in regard to recent surgery/hospitalization. Explored and identified barriers to asking for support/assistance from others. Assisted patient in generating alternative perspectives as it relates to asking for support/assistance from others. Explored and identified triggers for depressed mood.    Collaboration of Care: Other not required at this time   Diagnosis:  Major depressive disorder, recurrent episode, moderate (HCC)   Anxiety disorder, unspecified type     Plan: Patient is to utilize [employer] Therapy, thought re-framing, behavioral activation, relaxation techniques, mindfulness and coping strategies to decrease symptoms associated with their diagnosis. Frequency: weekly  Modality: individual      Long-term goal:   Reduce overall level, frequency, and intensity of the feelings of depression and anxiety as evidenced by decreased loss of motivation, loss of interest, psychomotor retardation, worry, lack of energy, difficulty concentrating, depressed mood, difficulty falling asleep and  returning to sleep, and inactivity from 7 days/week to 0 to 1 days/week per patient report for at least 3 consecutive months. Target Date: 11/25/24  Progress: progressing    Short-term goal:  Increase patient's participation in physical activities, such as, yard work, walking half way around patient's neighborhood, walking nature trail located near patient's home from 1 time per week to 2 times week Target Date: 05/27/24  Progress: progressing    Increase patient's participation in activities/projects patient enjoys from 0 times per week to 3-4 times per week  Target Date: 05/27/24  Progress: progressing    Identify, challenge, and replace negative core beliefs, thought patterns, and negative self talk that contribute to feelings of depression and anxiety with positive thoughts, beliefs, and positive self talk per patient's report Target Date: 05/27/24  Progress: progressing    Continue to make healthy lifestyle changes, such as, healthy dietary choices and increase physical activity Target Date: 05/27/24  Progress: progressing                      Burlene Carpen, LCSW

## 2024-01-21 ENCOUNTER — Telehealth: Payer: Self-pay | Admitting: *Deleted

## 2024-01-21 ENCOUNTER — Other Ambulatory Visit: Payer: Self-pay | Admitting: Orthopaedic Surgery

## 2024-01-21 MED ORDER — OXYCODONE HCL 5 MG PO TABS: 5.0000 mg | ORAL_TABLET | Freq: Four times a day (QID) | ORAL | 0 refills | Status: DC | PRN

## 2024-01-21 NOTE — Telephone Encounter (Signed)
 Patient calling to request refill of pain medication. Thank you.

## 2024-01-22 ENCOUNTER — Ambulatory Visit: Admitting: Physical Therapy

## 2024-01-22 ENCOUNTER — Encounter: Payer: Self-pay | Admitting: Physical Therapy

## 2024-01-22 DIAGNOSIS — R600 Localized edema: Secondary | ICD-10-CM | POA: Diagnosis not present

## 2024-01-22 DIAGNOSIS — G8929 Other chronic pain: Secondary | ICD-10-CM

## 2024-01-22 DIAGNOSIS — M25662 Stiffness of left knee, not elsewhere classified: Secondary | ICD-10-CM | POA: Diagnosis not present

## 2024-01-22 DIAGNOSIS — M25562 Pain in left knee: Secondary | ICD-10-CM | POA: Diagnosis not present

## 2024-01-22 DIAGNOSIS — M6281 Muscle weakness (generalized): Secondary | ICD-10-CM | POA: Diagnosis not present

## 2024-01-22 DIAGNOSIS — R2689 Other abnormalities of gait and mobility: Secondary | ICD-10-CM | POA: Diagnosis not present
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
## 2024-01-23 ENCOUNTER — Ambulatory Visit

## 2024-01-23 DIAGNOSIS — G4733 Obstructive sleep apnea (adult) (pediatric): Secondary | ICD-10-CM | POA: Diagnosis not present

## 2024-01-26 ENCOUNTER — Encounter: Payer: Self-pay | Admitting: Physical Therapy

## 2024-01-26 ENCOUNTER — Ambulatory Visit: Admitting: Physical Therapy

## 2024-01-26 DIAGNOSIS — M25662 Stiffness of left knee, not elsewhere classified: Secondary | ICD-10-CM

## 2024-01-26 DIAGNOSIS — G8929 Other chronic pain: Secondary | ICD-10-CM | POA: Diagnosis not present

## 2024-01-26 DIAGNOSIS — M25562 Pain in left knee: Secondary | ICD-10-CM

## 2024-01-26 DIAGNOSIS — R2689 Other abnormalities of gait and mobility: Secondary | ICD-10-CM

## 2024-01-26 DIAGNOSIS — R600 Localized edema: Secondary | ICD-10-CM

## 2024-01-26 DIAGNOSIS — M6281 Muscle weakness (generalized): Secondary | ICD-10-CM
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
## 2024-01-27 ENCOUNTER — Other Ambulatory Visit: Payer: Self-pay | Admitting: Physician Assistant

## 2024-01-27 ENCOUNTER — Telehealth: Payer: Self-pay | Admitting: *Deleted

## 2024-01-27 MED ORDER — OXYCODONE HCL 5 MG PO TABS: 5.0000 mg | ORAL_TABLET | Freq: Four times a day (QID) | ORAL | 0 refills | Status: DC | PRN

## 2024-01-27 NOTE — Telephone Encounter (Signed)
 Call from patient requesting refill of pain medication. Using sparingly and continues with OPPT. Thank you.

## 2024-01-29 ENCOUNTER — Encounter: Payer: Self-pay | Admitting: Rehabilitative and Restorative Service Providers"

## 2024-01-29 ENCOUNTER — Encounter: Payer: Self-pay | Admitting: Physician Assistant

## 2024-01-29 ENCOUNTER — Ambulatory Visit: Admitting: Physician Assistant

## 2024-01-29 ENCOUNTER — Ambulatory Visit: Admitting: Rehabilitative and Restorative Service Providers"

## 2024-01-29 DIAGNOSIS — R600 Localized edema: Secondary | ICD-10-CM | POA: Diagnosis not present

## 2024-01-29 DIAGNOSIS — G8929 Other chronic pain: Secondary | ICD-10-CM

## 2024-01-29 DIAGNOSIS — M6281 Muscle weakness (generalized): Secondary | ICD-10-CM | POA: Diagnosis not present

## 2024-01-29 DIAGNOSIS — M25562 Pain in left knee: Secondary | ICD-10-CM

## 2024-01-29 DIAGNOSIS — Z96652 Presence of left artificial knee joint: Secondary | ICD-10-CM

## 2024-01-29 DIAGNOSIS — R2689 Other abnormalities of gait and mobility: Secondary | ICD-10-CM | POA: Diagnosis not present

## 2024-01-29 DIAGNOSIS — M25662 Stiffness of left knee, not elsewhere classified: Secondary | ICD-10-CM

## 2024-01-29 MED ORDER — HYDROCODONE-ACETAMINOPHEN 5-325 MG PO TABS: 1.0000 | ORAL_TABLET | Freq: Four times a day (QID) | ORAL | 0 refills | Status: DC | PRN
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
## 2024-01-29 NOTE — Progress Notes (Signed)
 HPI: [name] returns today status post left total knee arthroplasty 12/19/2023.  States overall he is doing well.  Continues to work with therapy.  Using compression socks.  Wants to change from oxycodone  to Norco for pain control.  Rates his pain to be mostly time 1-5 out of 10 at its worst 6 out of 10 pain.  Review of systems: See HPI otherwise negative or noncontributory.  Physical exam: General: Well-developed well-nourished male in no acute distress Left knee lacks full extension by few degrees and flexes to 105 degrees no instability valgus varus stressing.  No abnormal warmth erythema.  Calf supple nontender.  Dorsiflexion plantarflexion left ankle intact.  Incision benign  Impression: Status post left total knee arthroplasty  Plan: He will continue work on range of motion strengthening the knee with therapy.  Continue to work on scar tissue mobilization.  Follow-up with us  in 4 weeks sooner if there is any questions concerns.

## 2024-01-31 ENCOUNTER — Other Ambulatory Visit: Payer: Self-pay | Admitting: Medical

## 2024-01-31 MED FILL — Trazodone HCl Tab 100 MG: ORAL | 90 days supply | Qty: 180 | Fill #1 | Status: CN

## 2024-02-02 ENCOUNTER — Ambulatory Visit: Admitting: Physical Therapy

## 2024-02-02 ENCOUNTER — Encounter: Payer: Self-pay | Admitting: Physical Therapy

## 2024-02-02 ENCOUNTER — Other Ambulatory Visit: Payer: Self-pay

## 2024-02-02 ENCOUNTER — Other Ambulatory Visit (HOSPITAL_COMMUNITY): Payer: Self-pay

## 2024-02-02 DIAGNOSIS — M25662 Stiffness of left knee, not elsewhere classified: Secondary | ICD-10-CM

## 2024-02-02 DIAGNOSIS — R2689 Other abnormalities of gait and mobility: Secondary | ICD-10-CM | POA: Diagnosis not present

## 2024-02-02 DIAGNOSIS — M25562 Pain in left knee: Secondary | ICD-10-CM | POA: Diagnosis not present

## 2024-02-02 DIAGNOSIS — M6281 Muscle weakness (generalized): Secondary | ICD-10-CM | POA: Diagnosis not present

## 2024-02-02 DIAGNOSIS — R600 Localized edema: Secondary | ICD-10-CM

## 2024-02-02 DIAGNOSIS — G8929 Other chronic pain: Secondary | ICD-10-CM | POA: Diagnosis not present
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
## 2024-02-03 ENCOUNTER — Other Ambulatory Visit: Payer: Self-pay

## 2024-02-03 ENCOUNTER — Other Ambulatory Visit (HOSPITAL_COMMUNITY): Payer: Self-pay

## 2024-02-03 MED ORDER — AMLODIPINE BESYLATE 5 MG PO TABS
5.0000 mg | ORAL_TABLET | Freq: Every day | ORAL | 2 refills | Status: AC
  Filled 2024-02-03 – 2024-05-03 (×2): qty 90, 90d supply, fill #0
  Filled 2024-08-02: qty 90, 90d supply, fill #1

## 2024-02-05 ENCOUNTER — Encounter: Payer: Self-pay | Admitting: Physical Therapy

## 2024-02-05 ENCOUNTER — Ambulatory Visit: Admitting: Physical Therapy

## 2024-02-05 DIAGNOSIS — R600 Localized edema: Secondary | ICD-10-CM

## 2024-02-05 DIAGNOSIS — M25562 Pain in left knee: Secondary | ICD-10-CM | POA: Diagnosis not present

## 2024-02-05 DIAGNOSIS — M25662 Stiffness of left knee, not elsewhere classified: Secondary | ICD-10-CM

## 2024-02-05 DIAGNOSIS — G8929 Other chronic pain: Secondary | ICD-10-CM

## 2024-02-05 DIAGNOSIS — R2689 Other abnormalities of gait and mobility: Secondary | ICD-10-CM | POA: Diagnosis not present

## 2024-02-05 DIAGNOSIS — M6281 Muscle weakness (generalized): Secondary | ICD-10-CM | POA: Diagnosis not present
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
## 2024-02-09 ENCOUNTER — Encounter: Payer: Self-pay | Admitting: Orthopaedic Surgery

## 2024-02-10 ENCOUNTER — Encounter: Payer: Self-pay | Admitting: Rehabilitative and Restorative Service Providers"

## 2024-02-10 ENCOUNTER — Ambulatory Visit: Admitting: Rehabilitative and Restorative Service Providers"

## 2024-02-10 DIAGNOSIS — M25562 Pain in left knee: Secondary | ICD-10-CM

## 2024-02-10 DIAGNOSIS — M25662 Stiffness of left knee, not elsewhere classified: Secondary | ICD-10-CM

## 2024-02-10 DIAGNOSIS — R600 Localized edema: Secondary | ICD-10-CM | POA: Diagnosis not present

## 2024-02-10 DIAGNOSIS — G8929 Other chronic pain: Secondary | ICD-10-CM | POA: Diagnosis not present

## 2024-02-10 DIAGNOSIS — R2689 Other abnormalities of gait and mobility: Secondary | ICD-10-CM

## 2024-02-10 DIAGNOSIS — M6281 Muscle weakness (generalized): Secondary | ICD-10-CM
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
## 2024-02-11 ENCOUNTER — Ambulatory Visit (INDEPENDENT_AMBULATORY_CARE_PROVIDER_SITE_OTHER): Admitting: Clinical

## 2024-02-11 DIAGNOSIS — F331 Major depressive disorder, recurrent, moderate: Secondary | ICD-10-CM

## 2024-02-11 DIAGNOSIS — F419 Anxiety disorder, unspecified: Secondary | ICD-10-CM | POA: Diagnosis not present

## 2024-02-11 NOTE — Progress Notes (Signed)
 [facility] Counselor/Therapist Progress Note  Patient ID: [name], MRN: [id number],    Date: 02/11/2024  Time Spent: 8:39am - 9:37am : 58 minutes    Treatment Type: Individual Therapy  Reported Symptoms: Patient reported difficulty falling asleep, staying asleep, and returning to sleep.  Mental Status Exam: Appearance:  Fairly Groomed     Behavior: Appropriate  Motor: Normal  Speech/Language:  Clear and Coherent and Normal Rate  Affect: Appropriate  Mood: normal  Thought process: normal  Thought content:   WNL  Sensory/Perceptual disturbances:   WNL  Orientation: oriented to person, place, time/date, and situation  Attention: Good  Concentration: Good  Memory: WNL  Fund of knowledge:  Good  Insight:   Good  Judgment:  Good  Impulse Control: Good   Risk Assessment: Danger to Self:  No Patient denied current suicidal ideation  Self-injurious Behavior: No Danger to Others: No Patient denied current homicidal ideation Duty to Warn:no Physical Aggression / Violence:No  Access to Firearms a concern: No  Gang Involvement:No   Subjective:  Patient stated, doing a little bit around the house now, everything's been going pretty decent for me in response to events since last session. Patient stated, improved in response to mood since last session. Patient reported feeling bored due to limitations in patient's activities while recovering from knee surgery. Patient stated, everything is going good, everything is going to plan in reference to patient's recovery from knee surgery. Patient reported restlessness and not sleeping well. Patient reported feeling patient's recovery from knee surgery is contributing to decline in sleep. Patient stated, having the knee done and knowing I can get up and walk without excruciating pain and reported being able to perform activities without pain in patient's knee are contributing factors to improvement in mood. Patient  reported patient is observing progress/improvement in patient's recovery from knee surgery. Patient stated, the best thing about this is when I have the right knee done I now know what to expect.  Patient reported feeling he sleeps well between the hours of 4am - 5am to 9am-9:30am. Patient reported patient eats a meal between 5-6pm and goes to bed at approximately 10pm-10:30pm. Patient reported watching television prior to going to sleep. Patient reported drinking 1-2 cups of coffee in the mornings and tea at lunch or early in the afternoon. Patient reported he has noticed improvement in sleep when going to bed at a later time. Patient reported concern regarding changes in patient's wife's memory and wife repeating statements/questions. Patient reported patient tailors his activities due to concerns for wife.   Interventions: Cognitive Behavioral Therapy. Clinician conducted session in person at clinician's office at [facility]. Reviewed events since last session and assessed for changes. Discussed the status of patient's recovery from knee surgery.  Explored and identified contributing factors to recent improvement in patient's mood. Explored and identified barriers to sleep. Provided psycho education related to sleep hygiene and explored patient's sleep schedule/sleep hygiene. Discussed patient's concerns related to patient's wife. Discussed strategies to approach conversation with patient's wife regarding patient's concerns and evaluation. Clinician requested for homework patient complete a sleep diary.    Collaboration of Care: Other not required at this time   Diagnosis:  Major depressive disorder, recurrent episode, moderate (HCC)   Anxiety disorder, unspecified type     Plan: Patient is to utilize [employer] Therapy, thought re-framing, behavioral activation, relaxation techniques, mindfulness and coping strategies to decrease symptoms associated with their  diagnosis. Frequency: weekly  Modality: individual  Long-term goal:   Reduce overall level, frequency, and intensity of the feelings of depression and anxiety as evidenced by decreased loss of motivation, loss of interest, psychomotor retardation, worry, lack of energy, difficulty concentrating, depressed mood, difficulty falling asleep and returning to sleep, and inactivity from 7 days/week to 0 to 1 days/week per patient report for at least 3 consecutive months. Target Date: 11/25/24  Progress: progressing    Short-term goal:  Increase patient's participation in physical activities, such as, yard work, walking half way around patient's neighborhood, walking nature trail located near patient's home from 1 time per week to 2 times week Target Date: 05/27/24  Progress: progressing    Increase patient's participation in activities/projects patient enjoys from 0 times per week to 3-4 times per week  Target Date: 05/27/24  Progress: progressing    Identify, challenge, and replace negative core beliefs, thought patterns, and negative self talk that contribute to feelings of depression and anxiety with positive thoughts, beliefs, and positive self talk per patient's report Target Date: 05/27/24  Progress: progressing    Continue to make healthy lifestyle changes, such as, healthy dietary choices and increase physical activity Target Date: 05/27/24  Progress: progressing            Burlene Carpen, LCSW

## 2024-02-11 NOTE — Progress Notes (Signed)
   Doree Barthel, LCSW

## 2024-02-12 ENCOUNTER — Encounter: Payer: Self-pay | Admitting: Rehabilitative and Restorative Service Providers"

## 2024-02-12 ENCOUNTER — Ambulatory Visit: Admitting: Rehabilitative and Restorative Service Providers"

## 2024-02-12 DIAGNOSIS — R2689 Other abnormalities of gait and mobility: Secondary | ICD-10-CM | POA: Diagnosis not present

## 2024-02-12 DIAGNOSIS — M25562 Pain in left knee: Secondary | ICD-10-CM | POA: Diagnosis not present

## 2024-02-12 DIAGNOSIS — R600 Localized edema: Secondary | ICD-10-CM | POA: Diagnosis not present

## 2024-02-12 DIAGNOSIS — G8929 Other chronic pain: Secondary | ICD-10-CM | POA: Diagnosis not present

## 2024-02-12 DIAGNOSIS — M25662 Stiffness of left knee, not elsewhere classified: Secondary | ICD-10-CM

## 2024-02-12 DIAGNOSIS — M6281 Muscle weakness (generalized): Secondary | ICD-10-CM | POA: Diagnosis not present
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
## 2024-02-16 ENCOUNTER — Encounter: Payer: Self-pay | Admitting: Physical Therapy

## 2024-02-16 ENCOUNTER — Ambulatory Visit: Admitting: Physical Therapy

## 2024-02-16 DIAGNOSIS — R2689 Other abnormalities of gait and mobility: Secondary | ICD-10-CM

## 2024-02-16 DIAGNOSIS — M25562 Pain in left knee: Secondary | ICD-10-CM

## 2024-02-16 DIAGNOSIS — G8929 Other chronic pain: Secondary | ICD-10-CM

## 2024-02-16 DIAGNOSIS — M6281 Muscle weakness (generalized): Secondary | ICD-10-CM

## 2024-02-16 DIAGNOSIS — R600 Localized edema: Secondary | ICD-10-CM

## 2024-02-16 DIAGNOSIS — M25662 Stiffness of left knee, not elsewhere classified: Secondary | ICD-10-CM | POA: Diagnosis not present
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
## 2024-02-19 ENCOUNTER — Ambulatory Visit: Admitting: Physical Therapy

## 2024-02-19 ENCOUNTER — Encounter: Payer: Self-pay | Admitting: Physical Therapy

## 2024-02-19 DIAGNOSIS — R2689 Other abnormalities of gait and mobility: Secondary | ICD-10-CM | POA: Diagnosis not present

## 2024-02-19 DIAGNOSIS — M25562 Pain in left knee: Secondary | ICD-10-CM | POA: Diagnosis not present

## 2024-02-19 DIAGNOSIS — M6281 Muscle weakness (generalized): Secondary | ICD-10-CM | POA: Diagnosis not present

## 2024-02-19 DIAGNOSIS — R600 Localized edema: Secondary | ICD-10-CM | POA: Diagnosis not present

## 2024-02-19 DIAGNOSIS — M25662 Stiffness of left knee, not elsewhere classified: Secondary | ICD-10-CM | POA: Diagnosis not present

## 2024-02-19 DIAGNOSIS — G8929 Other chronic pain: Secondary | ICD-10-CM | POA: Diagnosis not present
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
## 2024-02-23 ENCOUNTER — Encounter: Admitting: Physical Therapy
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
## 2024-02-26 ENCOUNTER — Encounter: Admitting: Physical Therapy

## 2024-02-26 ENCOUNTER — Encounter: Payer: Self-pay | Admitting: Orthopaedic Surgery

## 2024-02-26 ENCOUNTER — Ambulatory Visit: Admitting: Orthopaedic Surgery

## 2024-02-26 DIAGNOSIS — Z96652 Presence of left artificial knee joint: Secondary | ICD-10-CM
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
## 2024-02-26 NOTE — Progress Notes (Signed)
 The patient is a 68 year old gentleman who is now 10 weeks status post a left total knee replacement treat significant left knee arthritis.  He is making excellent progress with his knee.    On exam he lacks full extension by just a few degrees.  His flexion is excellent and the knee feels lunacy stable.  Every time we have seen him he has not been increasing.  He is mobilizing well.  He would like to hold off on any other physical therapy and save the remaining physical therapy for when he considers having his right knee treated.  From our standpoint we will see him back in 3 months with a standing AP and lateral of his left operative knee.  If there are issues before then he knows to let us  know.

## 2024-03-01 ENCOUNTER — Encounter: Admitting: Physical Therapy

## 2024-03-03 ENCOUNTER — Ambulatory Visit (INDEPENDENT_AMBULATORY_CARE_PROVIDER_SITE_OTHER): Admitting: Clinical

## 2024-03-03 DIAGNOSIS — Z961 Presence of intraocular lens: Secondary | ICD-10-CM | POA: Diagnosis not present

## 2024-03-03 DIAGNOSIS — F419 Anxiety disorder, unspecified: Secondary | ICD-10-CM | POA: Diagnosis not present

## 2024-03-03 DIAGNOSIS — H2511 Age-related nuclear cataract, right eye: Secondary | ICD-10-CM | POA: Diagnosis not present

## 2024-03-03 DIAGNOSIS — F331 Major depressive disorder, recurrent, moderate: Secondary | ICD-10-CM | POA: Diagnosis not present

## 2024-03-03 NOTE — Progress Notes (Signed)
   Kevin Barthel, LCSW

## 2024-03-03 NOTE — Progress Notes (Signed)
 [facility] Counselor/Therapist Progress Note  Patient ID: [name], MRN: [id number],    Date: 03/03/2024  Time Spent: 1:33pm - 2:20pm : 47 minutes   Treatment Type: Individual Therapy  Reported Symptoms: anger this morning  Mental Status Exam: Appearance:  Neat and Well Groomed     Behavior: Appropriate  Motor: Normal  Speech/Language:  Clear and Coherent and Normal Rate  Affect: Appropriate  Mood: normal  Thought process: normal  Thought content:   WNL  Sensory/Perceptual disturbances:   WNL  Orientation: oriented to person, place, time/date, and situation  Attention: Good  Concentration: Good  Memory: WNL  Fund of knowledge:  Good  Insight:   Good  Judgment:  Good  Impulse Control: Good   Risk Assessment: Danger to Self:  No Patient denied current suicidal ideation  Self-injurious Behavior: No Danger to Others: No Patient denied current homicidal ideation Duty to Warn:no Physical Aggression / Violence:No  Access to Firearms a concern: No  Gang Involvement:No   Subjective: Patient  stated, they ain't been going as good in response to events this week. Patient reported patient has completed physical therapy and has completed several tasks this week. Patient stated, a little tired, a little stiff today in reference to patient's physical well being today. Patient reported patient picked up a load of wood and has performed multiple activities this week. Patient stated, getting the wood out was a little too much. Patient stated, my sleep schedule has been disrupted because I've had to get up so early every day and reported feeling ill tempered this week. Patient stated, I can't get out of the thought if I start a small project I have this deep down concern that I wont finish it that day so I wont get it started. Patient reported patient completed sleep diary and reported sleep diary was completed while patient was experiencing pain from knee surgery  and taking pain medications. Patient reported patient's sleep has improved since patient discontinued pain medications. Patient stated, I've slept good since last Thursday. Patient reported patient does not go to bed early if patient has to get up early next day. Patient stated, Im afraid if I go to bed I'm going to miss something in reference to watching television shows patient enjoys. Patient reported experiencing thoughts related to patient's follow up with knee surgeon and progress have been barriers to sleep. Patient stated, I'm feeling real good now in response to patient's current mood.   Interventions: Cognitive Behavioral Therapy. Clinician conducted session in person at clinician's office at [facility]. Reviewed events since last session and assessed for changes. Provided supportive therapy and active listening as patient discussed stressors related to providing transportation for neighbors and patient's response. Explored strategies to utilize in response to stressors, such as, pharmacies offering delivery services, establishing boundaries regarding transportation. Reviewed sleep diary. Explored and identified barriers to patient maintaining a sleep schedule. Explored thoughts impacting patient's sleep. Provided psycho education related maintaining a sleep schedule. Clinician requested for homework patient complete a sleep diary.    Collaboration of Care: Other not required at this time   Diagnosis:  Major depressive disorder, recurrent episode, moderate (HCC)   Anxiety disorder, unspecified type     Plan: Patient is to utilize [employer], thought re-framing, behavioral activation, relaxation techniques, mindfulness and coping strategies to decrease symptoms associated with their diagnosis. Frequency: weekly  Modality: individual      Long-term goal:   Reduce overall level, frequency, and intensity of the  feelings of depression and anxiety as evidenced  by decreased loss of motivation, loss of interest, psychomotor retardation, worry, lack of energy, difficulty concentrating, depressed mood, difficulty falling asleep and returning to sleep, and inactivity from 7 days/week to 0 to 1 days/week per patient report for at least 3 consecutive months. Target Date: 11/25/24  Progress: progressing    Short-term goal:  Increase patient's participation in physical activities, such as, yard work, walking half way around patient's neighborhood, walking nature trail located near patient's home from 1 time per week to 2 times week Target Date: 05/27/24  Progress: progressing    Increase patient's participation in activities/projects patient enjoys from 0 times per week to 3-4 times per week  Target Date: 05/27/24  Progress: progressing    Identify, challenge, and replace negative core beliefs, thought patterns, and negative self talk that contribute to feelings of depression and anxiety with positive thoughts, beliefs, and positive self talk per patient's report Target Date: 05/27/24  Progress: progressing    Continue to make healthy lifestyle changes, such as, healthy dietary choices and increase physical activity Target Date: 05/27/24  Progress: progressing     Darice Seats, LCSW

## 2024-03-04 ENCOUNTER — Encounter: Admitting: Rehabilitative and Restorative Service Providers"

## 2024-03-05 ENCOUNTER — Encounter: Payer: Self-pay | Admitting: Internal Medicine

## 2024-03-05 ENCOUNTER — Other Ambulatory Visit: Payer: Self-pay

## 2024-03-05 ENCOUNTER — Other Ambulatory Visit: Payer: Self-pay | Admitting: Internal Medicine

## 2024-03-05 ENCOUNTER — Ambulatory Visit (INDEPENDENT_AMBULATORY_CARE_PROVIDER_SITE_OTHER): Admitting: Nurse Practitioner

## 2024-03-05 VITALS — BP 126/74 | HR 65 | Temp 98.4°F | Ht 70.0 in | Wt 270.0 lb

## 2024-03-05 DIAGNOSIS — L918 Other hypertrophic disorders of the skin: Secondary | ICD-10-CM | POA: Diagnosis not present

## 2024-03-05 MED ORDER — METHYLPHENIDATE HCL 10 MG PO TABS
10.0000 mg | ORAL_TABLET | Freq: Two times a day (BID) | ORAL | 0 refills | Status: DC
Start: 2024-03-05 — End: 2024-04-04
  Filled 2024-03-05: qty 60, 30d supply, fill #0

## 2024-03-05 NOTE — Telephone Encounter (Signed)
 Last written 12-05-23 #60 Last OV 10-27-23 Next OV 03-25-24 [facility]

## 2024-03-05 NOTE — Progress Notes (Signed)
   Acute Office Visit  Subjective:     Patient ID: [name], male    DOB: 1956/06/03, 68 y.o.   MRN: [id number]  Chief Complaint  Patient presents with   Mass    Pt complains of hard spot since age 80 that recently became soft with some kind of mole like growth. No pain, slight itchiness, no odor or puss.  Noticed the softness today. Located at lower back.     HPI Patient is in today for skin issue with a history of CAD, CHF, HTN, OSA, hypothyroidism, GERD, MDD, obesity  States that it iwas around his waist ling and has been there since he was 16. States that today he noticed it was spongey and soft. States no discharge. States no pain  Patient does have family history of skin cancer.  Patient was followed by dermatology in the past but has been an extended period of time since he has been evaluated by dermatology. Review of Systems  Constitutional:  Negative for chills and fever.  Skin:        + skin lesion         Objective:    BP 126/74   Pulse 65   Temp 98.4 F (36.9 C) (Oral)   Ht 5' 10 (1.778 m)   Wt 270 lb (122.5 kg)   SpO2 97%   BMI 38.74 kg/m  BP Readings from Last 3 Encounters:  03/05/24 126/74  12/21/23 132/76  12/10/23 111/75   Wt Readings from Last 3 Encounters:  03/05/24 270 lb (122.5 kg)  12/19/23 268 lb (121.6 kg)  12/08/23 274 lb (124.3 kg)   SpO2 Readings from Last 3 Encounters:  03/05/24 97%  12/21/23 95%  12/10/23 94%      Physical Exam Vitals and nursing note reviewed.  Constitutional:      Appearance: Normal appearance.  Cardiovascular:     Rate and Rhythm: Normal rate and regular rhythm.     Heart sounds: Normal heart sounds.  Pulmonary:     Effort: Pulmonary effort is normal.     Breath sounds: Normal breath sounds.  Skin:    Findings: Lesion present.  Neurological:     Mental Status: He is alert.     No results found for any visits on 03/05/24.      Assessment & Plan:   Problem List Items Addressed This  Visit       Musculoskeletal and Integument   Skin tag - Primary   Skin tag noninflamed in office fats talk with fat body.  Offered referral to dermatology patient politely declined.       No orders of the defined types were placed in this encounter.   Return if symptoms worsen or fail to improve.  Adina Crandall, NP

## 2024-03-05 NOTE — Patient Instructions (Signed)
 Nice to see you today The lesion looks ok on your back  I took a picture of it and put it in your chart Follow up with Dr. Jimmy as scheduled. Sooner if you need us 

## 2024-03-05 NOTE — Assessment & Plan Note (Signed)
 Skin tag noninflamed in office fats talk with fat body.  Offered referral to dermatology patient politely declined.

## 2024-03-08 ENCOUNTER — Encounter: Admitting: Physical Therapy

## 2024-03-10 ENCOUNTER — Other Ambulatory Visit (HOSPITAL_COMMUNITY): Payer: Self-pay

## 2024-03-10 MED FILL — Trazodone HCl Tab 100 MG: ORAL | 90 days supply | Qty: 180 | Fill #1 | Status: AC

## 2024-03-11 ENCOUNTER — Encounter: Admitting: Physical Therapy

## 2024-03-16 ENCOUNTER — Ambulatory Visit (INDEPENDENT_AMBULATORY_CARE_PROVIDER_SITE_OTHER): Admitting: Internal Medicine

## 2024-03-16 ENCOUNTER — Encounter (INDEPENDENT_AMBULATORY_CARE_PROVIDER_SITE_OTHER): Payer: Self-pay | Admitting: Internal Medicine

## 2024-03-16 VITALS — BP 134/84 | HR 54 | Temp 97.7°F | Ht 70.0 in | Wt 265.0 lb

## 2024-03-16 DIAGNOSIS — I110 Hypertensive heart disease with heart failure: Secondary | ICD-10-CM | POA: Diagnosis not present

## 2024-03-16 DIAGNOSIS — I25118 Atherosclerotic heart disease of native coronary artery with other forms of angina pectoris: Secondary | ICD-10-CM | POA: Diagnosis not present

## 2024-03-16 DIAGNOSIS — I5032 Chronic diastolic (congestive) heart failure: Secondary | ICD-10-CM

## 2024-03-16 DIAGNOSIS — Z6838 Body mass index (BMI) 38.0-38.9, adult: Secondary | ICD-10-CM | POA: Diagnosis not present

## 2024-03-16 DIAGNOSIS — G4733 Obstructive sleep apnea (adult) (pediatric): Secondary | ICD-10-CM

## 2024-03-16 DIAGNOSIS — I10 Essential (primary) hypertension: Secondary | ICD-10-CM

## 2024-03-16 DIAGNOSIS — E669 Obesity, unspecified: Secondary | ICD-10-CM | POA: Diagnosis not present

## 2024-03-16 NOTE — Progress Notes (Signed)
 Office: [phone]  /  Fax: [phone]  Weight Summary and Body Composition Analysis (BIA)  Vitals Temp: 97.7 F (36.5 C) BP: 134/84 Pulse Rate: (!) 54 SpO2: 96 %   Anthropometric Measurements Height: 5' 10 (1.778 m) Weight: 265 lb (120.2 kg) BMI (Calculated): 38.02 Weight at Last Visit: 274 lb Weight Lost Since Last Visit: 5 lb Weight Gained Since Last Visit: 9 lb Starting Weight: 320 lb Total Weight Loss (lbs): 55 lb (24.9 kg) Peak Weight: 382 lb   Body Composition  Body Fat %: 39.6 % Fat Mass (lbs): 105 lbs Muscle Mass (lbs): 152.4 lbs Total Body Water  (lbs): 115 lbs Visceral Fat Rating : 25    RMR: [id number]  Today's Visit #: 22  Starting Date: 07/10/23   Subjective   Chief Complaint: Obesity  Interval History Discussed the use of AI scribe software for clinical note transcription with the patient, who gave verbal consent to proceed.  History of Present Illness   [name] is a 68 year old male who presents for medical weight management following a left knee replacement. He is accompanied by [name], his wife  He adheres to a 1500 calorie meal plan approximately 65% of the time, focusing on whole foods, adequate protein intake, and hydration. He does not skip meals and reports adequate sleep and low stress levels. Post-surgery, he has resumed physical activity, engaging in outdoor projects and working around the house for 4-5 hours every other day.  He has lost 9 pounds since his last visit, with no weight gain post-surgery. His current weight is 265 pounds, a significant reduction from his initial weight of 328 pounds when he started the program in November 2023. He notes that he has not been at this weight since high school. His BMI has decreased from 46 to 38.  He is currently taking baby aspirin , bupropion , Ritalin  (twice daily), methotrexate, metoprolol , Lipitor, and gabapentin . Reducing gabapentin  results in significant pain in his left hand,  affecting his ability to hold objects. He has some sensation in his hand but struggles to hold paper between his fingers.  He has been using a CPAP machine for sleep apnea for nearly four years and cannot sleep without it.  He discontinued testosterone  in May due to lack of efficacy.       Challenges affecting patient progress: none.    Pharmacotherapy for weight management: He is currently taking no anti-obesity medication and patient has declined pharmacotherapy in past.   Assessment and Plan   Treatment Plan For Obesity:  Recommended Dietary Goals  [name] is currently in the action stage of change. As such, his goal is to continue weight management plan. He has agreed to: continue current plan  Behavioral Health and Counseling  We discussed the following behavioral modification strategies today: continue to work on maintaining a reduced calorie state, getting the recommended amount of protein, incorporating whole foods, making healthy choices, staying well hydrated and practicing mindfulness when eating..  Additional education and resources provided today: None  Recommended Physical Activity Goals  [name] has been advised to work up to 150 minutes of moderate intensity aerobic activity a week and strengthening exercises 2-3 times per week for cardiovascular health, weight loss maintenance and preservation of muscle mass.   He has agreed to :  Think about enjoyable ways to increase daily physical activity and overcoming barriers to exercise and Increase physical activity in their day and reduce sedentary time (increase NEAT).  Medical Interventions and Pharmacotherapy  We discussed various medication options  to help [name] with his weight loss efforts and we both agreed to : Continue with current nutritional and behavioral strategies  Associated Conditions Impacted by Obesity Treatment  Assessment & Plan Coronary artery disease of native artery of native heart with stable  angina pectoris (HCC) Blood pressure is well-controlled at 134/84 mmHg. He is on metoprolol  and atorvastatin , which are effectively managing blood pressure and cardiovascular health. - Continue metoprolol  and atorvastatin  as prescribed. Chronic heart failure with preserved ejection fraction (HFpEF) (HCC) Blood pressure is well-controlled at 134/84 mmHg. He is on metoprolol  and atorvastatin , which are effectively managing blood pressure and cardiovascular health. - Continue metoprolol  and atorvastatin  as prescribed. Essential hypertension Blood pressure is well-controlled at 134/84 mmHg. He is on metoprolol  and atorvastatin , which are effectively managing blood pressure and cardiovascular health. - Continue metoprolol  and atorvastatin  as prescribed. OSA on CPAP On CPAP with reported good compliance. He has lost a total of 20% of total body weight. He uses CPAP consistently and reports inability to sleep without it, indicating ongoing need for CPAP therapy despite significant weight loss. No repeat polysomnography planned as symptoms persist without CPAP. - Continue CPAP therapy.  Generalized obesity -with the starting BMI of 46 2023 He has achieved significant weight loss, reducing from 328 lbs to 265 lbs, marking a 20% weight loss since starting the program in November 2023. Current BMI is 38, down from 46. He follows a 1500 calorie meal plan 65% of the time, exercises three days a week, and reports adequate sleep and low stress levels. Weight loss has been achieved through lifestyle changes alone, surpassing typical outcomes for lifestyle interventions. Discussed the obesity treatment pyramid. [name] was discussed as a potential future option if weight loss plateaus, noting its benefits for cardiovascular health, including reducing the risk of stroke, myocardial infarction, and other conditions such as heart failure, hypertension. - Continue current 1500 calorie meal plan and exercise regimen. -  Consider Wegovy if weight loss plateaus. BMI 38.0-38.9,adult    Left Knee Replacement He underwent left knee arthroplasty on April 25 and completed physical therapy 10 weeks post-surgery. Reports satisfaction with the outcome and has resumed physical activities, including working on projects outside for 4-5 hours every other day.  General Health Maintenance He is maintaining a healthy lifestyle with adequate hydration, whole food consumption, and protein intake. No signs of fluid retention or heart failure exacerbation. - Ensure protein intake of at least 90 grams per day.        Objective   Physical Exam:  Blood pressure 134/84, pulse (!) 54, temperature 97.7 F (36.5 C), height 5' 10 (1.778 m), weight 265 lb (120.2 kg), SpO2 96%. Body mass index is 38.02 kg/m.  General: He is overweight, cooperative, alert, well developed, and in no acute distress. PSYCH: Has normal mood, affect and thought process.   HEENT: EOMI, sclerae are anicteric. Lungs: Normal breathing effort, no conversational dyspnea. Extremities: No edema.  Neurologic: No gross sensory or motor deficits. No tremors or fasciculations noted.    Diagnostic Data Reviewed:  BMET    Component Value Date/Time   NA 138 12/20/2023 0356   NA 143 10/08/2022 1226   K 4.4 12/20/2023 0356   CL 102 12/20/2023 0356   CO2 25 12/20/2023 0356   GLUCOSE 126 (H) 12/20/2023 0356   BUN 15 12/20/2023 0356   BUN 22 10/08/2022 1226   CREATININE 0.77 12/20/2023 0356   CALCIUM  8.6 (L) 12/20/2023 0356   GFRNONAA >60 12/20/2023 0356   GFRAA >60  02/10/2020 0937   Lab Results  Component Value Date   HGBA1C 5.3 06/30/2023   HGBA1C 5.4 07/09/2022   Lab Results  Component Value Date   INSULIN  17.5 06/30/2023   INSULIN  35.5 (H) 07/09/2022   Lab Results  Component Value Date   TSH 2.41 03/06/2023   CBC    Component Value Date/Time   WBC 13.2 (H) 12/20/2023 0356   RBC 5.16 12/20/2023 0356   HGB 14.7 12/20/2023 0356   HGB  14.5 10/30/2023 0850   HCT 48.0 12/20/2023 0356   HCT 43.9 10/30/2023 0850   PLT 187 12/20/2023 0356   PLT 239 11/23/2021 1024   MCV 93.0 12/20/2023 0356   MCV 87 11/23/2021 1024   MCH 28.5 12/20/2023 0356   MCHC 30.6 12/20/2023 0356   RDW 13.5 12/20/2023 0356   RDW 13.4 11/23/2021 1024   Iron Studies No results found for: IRON, TIBC, FERRITIN, IRONPCTSAT Lipid Panel     Component Value Date/Time   CHOL 60 03/18/2023 0635   CHOL 82 (L) 07/09/2022 0913   TRIG 56 03/18/2023 0635   HDL 20 (L) 03/18/2023 0635   HDL 28 (L) 07/09/2022 0913   CHOLHDL 3.0 03/18/2023 0635   VLDL 11 03/18/2023 0635   LDLCALC 29 03/18/2023 0635   LDLCALC 33 07/09/2022 0913   Hepatic Function Panel     Component Value Date/Time   PROT 7.0 03/06/2023 1552   PROT 6.7 10/08/2022 1226   ALBUMIN 4.2 03/06/2023 1552   ALBUMIN 4.1 10/08/2022 1226   AST 14 03/06/2023 1552   ALT 16 03/06/2023 1552   ALKPHOS 82 03/06/2023 1552   BILITOT 0.4 03/06/2023 1552   BILITOT 0.3 10/08/2022 1226   BILIDIR <0.10 02/13/2021 0830      Component Value Date/Time   TSH 2.41 03/06/2023 1552   Nutritional Lab Results  Component Value Date   VD25OH 61.4 10/08/2022   VD25OH 18.2 (L) 07/09/2022    Medications: Outpatient Encounter Medications as of 03/16/2024  Medication Sig   amLODipine  (NORVASC ) 5 MG tablet Take 1 tablet (5 mg total) by mouth daily.   aspirin  81 MG chewable tablet Chew 1 tablet (81 mg total) by mouth 2 (two) times daily.   atorvastatin  (LIPITOR) 40 MG tablet Take 1 tablet (40 mg total) by mouth daily.   buPROPion  (WELLBUTRIN  XL) 300 MG 24 hr tablet Take 1 tablet (300 mg total) by mouth every morning.   Cholecalciferol (VITAMIN D3) 50 MCG (2000 UT) capsule Take 1 capsule (2,000 Units total) by mouth daily.   cyanocobalamin  (VITAMIN B12) 1000 MCG tablet Take 1 tablet (1,000 mcg total) by mouth daily.   diclofenac Sodium (VOLTAREN) 1 % GEL Apply 2 g topically daily as needed (pain).    DULoxetine  (CYMBALTA ) 60 MG capsule TAKE 1 CAPSULE EVERY       MORNING   furosemide  (LASIX ) 20 MG tablet Take 1 tablet (20mg ) by mouth daily AND take 1 additional tablet (20mg ) by mouth as needed for excess fluid).   gabapentin  (NEURONTIN ) 600 MG tablet Take 2 tablets (1,200 mg total) by mouth at bedtime.   isosorbide  mononitrate (IMDUR ) 60 MG 24 hr tablet Take 1 tablet (60 mg total) by mouth 2 (two) times daily.   levothyroxine  (SYNTHROID ) 88 MCG tablet Take 1 tablet (88 mcg total) by mouth daily before breakfast.   methylphenidate  (RITALIN ) 10 MG tablet Take 1 tablet (10 mg total) by mouth 2 (two) times daily with breakfast and lunch.   metoprolol  succinate (TOPROL -XL) 25 MG  24 hr tablet TAKE 1 TABLET DAILY   mometasone (NASONEX) 50 MCG/ACT nasal spray Place 2 sprays into the nose daily as needed (allergies).   Multiple Vitamins-Minerals (MULTI FOR HIM 50+) TABS Take 1 tablet by mouth 4 (four) times a week.   NEEDLE, DISP, 18 G (BD DISP NEEDLES) 18G X 1-1/2 MISC 1 mg by Does not apply route every 14 (fourteen) days.   NEEDLE, DISP, 21 G (BD DISP NEEDLES) 21G X 1-1/2 MISC 1 mg by Does not apply route every 14 (fourteen) days.   nitroGLYCERIN  (NITROSTAT ) 0.3 MG SL tablet Place 1 tablet (0.3 mg total) under the tongue every 5 (five) minutes as needed for chest pain. Max of 3 doses within 15 minutes   NON FORMULARY Pt uses c-pap nightly   Syringe, Disposable, (2-3CC SYRINGE) 3 ML MISC 1 mg by Does not apply route every 14 (fourteen) days.   testosterone  cypionate (DEPOTESTOSTERONE CYPIONATE) 200 MG/ML injection Inject 1 mL (200 mg total) into the muscle every 14 (fourteen) days.   traZODone  (DESYREL ) 100 MG tablet Take 1-2 tablets (100-200 mg total) by mouth at bedtime.   [DISCONTINUED] HYDROcodone -acetaminophen  (NORCO/VICODIN) 5-325 MG tablet Take 1-2 tablets by mouth every 6 (six) hours as needed for moderate pain (pain score 4-6).   [DISCONTINUED] tiZANidine  (ZANAFLEX ) 4 MG tablet Take 1 tablet  (4 mg total) by mouth every 6 (six) hours as needed for muscle spasms.   No facility-administered encounter medications on file as of 03/16/2024.     Follow-Up   Return in about 6 weeks (around 04/27/2024) for For Weight Mangement with Dr. Francyne.SABRA He was informed of the importance of frequent follow up visits to maximize his success with intensive lifestyle modifications for his multiple health conditions.  Attestation Statement   Reviewed by clinician on day of visit: allergies, medications, problem list, medical history, surgical history, family history, social history, and previous encounter notes.     Lucas Francyne, MD

## 2024-03-16 NOTE — Assessment & Plan Note (Signed)
 Blood pressure is well-controlled at 134/84 mmHg. He is on metoprolol  and atorvastatin , which are effectively managing blood pressure and cardiovascular health. - Continue metoprolol  and atorvastatin  as prescribed.

## 2024-03-16 NOTE — Assessment & Plan Note (Signed)
 On CPAP with reported good compliance. He has lost a total of 20% of total body weight. He uses CPAP consistently and reports inability to sleep without it, indicating ongoing need for CPAP therapy despite significant weight loss. No repeat polysomnography planned as symptoms persist without CPAP. - Continue CPAP therapy.

## 2024-03-17 DIAGNOSIS — H2511 Age-related nuclear cataract, right eye: Secondary | ICD-10-CM | POA: Diagnosis not present

## 2024-03-18 ENCOUNTER — Encounter: Payer: Self-pay | Admitting: Ophthalmology
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
## 2024-03-25 ENCOUNTER — Encounter: Payer: Self-pay | Admitting: Internal Medicine

## 2024-03-25 ENCOUNTER — Ambulatory Visit (INDEPENDENT_AMBULATORY_CARE_PROVIDER_SITE_OTHER): Payer: Medicare HMO | Admitting: Internal Medicine

## 2024-03-25 VITALS — BP 112/76 | HR 66 | Temp 97.8°F | Ht 70.0 in | Wt 271.0 lb

## 2024-03-25 DIAGNOSIS — G4733 Obstructive sleep apnea (adult) (pediatric): Secondary | ICD-10-CM

## 2024-03-25 DIAGNOSIS — Z0000 Encounter for general adult medical examination without abnormal findings: Secondary | ICD-10-CM | POA: Diagnosis not present

## 2024-03-25 DIAGNOSIS — F330 Major depressive disorder, recurrent, mild: Secondary | ICD-10-CM

## 2024-03-25 DIAGNOSIS — I25118 Atherosclerotic heart disease of native coronary artery with other forms of angina pectoris: Secondary | ICD-10-CM

## 2024-03-25 DIAGNOSIS — I5032 Chronic diastolic (congestive) heart failure: Secondary | ICD-10-CM | POA: Diagnosis not present

## 2024-03-25 DIAGNOSIS — D692 Other nonthrombocytopenic purpura: Secondary | ICD-10-CM | POA: Diagnosis not present

## 2024-03-25 NOTE — Assessment & Plan Note (Signed)
 Still gets lesions but improved

## 2024-03-25 NOTE — Assessment & Plan Note (Signed)
 Compensated with furosemide  20mg  daily, metoprolol  25, isosrobide 60
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
## 2024-03-25 NOTE — Discharge Instructions (Signed)

## 2024-03-25 NOTE — Assessment & Plan Note (Signed)
 BMI down to 38 with [facility] Continues there

## 2024-03-25 NOTE — Assessment & Plan Note (Signed)
 In remission on duloxetine  60, bupropion  300 and ritalin  10 bid. Trazodone  for sleep

## 2024-03-25 NOTE — Progress Notes (Signed)
 Hearing Screening - Comments:: Has hearing aids. Wearing them today.  Vision Screening - Comments:: July 2025. Having cataract surgery next week.

## 2024-03-25 NOTE — Assessment & Plan Note (Signed)
 Uses the CPAP every night and it very satisfied

## 2024-03-25 NOTE — Assessment & Plan Note (Signed)
 I have personally reviewed the Medicare Annual Wellness questionnaire and have noted 1. The patient's medical and social history 2. Their use of alcohol, tobacco or illicit drugs 3. Their current medications and supplements 4. The patient's functional ability including ADL's, fall risks, home safety risks and hearing or visual             impairment. 5. Diet and physical activities 6. Evidence for depression or mood disorders  The patients weight, height, BMI and visual acuity have been recorded in the chart I have made referrals, counseling and provided education to the patient based review of the above and I have provided the pt with a written personalized care plan for preventive services.  I have provided you with a copy of your personalized plan for preventive services. Please take the time to review along with your updated medication list.  Colon due 2029 Recent PSA Exercises regularly Flu vaccine in the fall--and still needs second shingrix

## 2024-03-25 NOTE — Assessment & Plan Note (Signed)
 Still with occasional angina 1 nitroglycerin  is effective ASA, atorvastatin 

## 2024-03-26 ENCOUNTER — Ambulatory Visit

## 2024-03-26 DIAGNOSIS — G4733 Obstructive sleep apnea (adult) (pediatric): Secondary | ICD-10-CM | POA: Diagnosis not present

## 2024-03-30 ENCOUNTER — Ambulatory Visit
Admission: RE | Admit: 2024-03-30 | Discharge: 2024-03-30 | Disposition: A | Discharge status: Home / Self Care | Attending: Ophthalmology | Admitting: Ophthalmology

## 2024-03-30 ENCOUNTER — Encounter: Payer: Self-pay | Admitting: Ophthalmology

## 2024-03-30 ENCOUNTER — Other Ambulatory Visit: Payer: Self-pay

## 2024-03-30 ENCOUNTER — Ambulatory Visit: Payer: Self-pay | Admitting: Anesthesiology

## 2024-03-30 ENCOUNTER — Encounter
Admission: RE | Disposition: A | Discharge status: Home / Self Care | Payer: Self-pay | Source: Home / Self Care | Attending: Ophthalmology

## 2024-03-30 DIAGNOSIS — I10 Essential (primary) hypertension: Secondary | ICD-10-CM | POA: Diagnosis not present

## 2024-03-30 DIAGNOSIS — H2511 Age-related nuclear cataract, right eye: Secondary | ICD-10-CM | POA: Diagnosis not present

## 2024-03-30 DIAGNOSIS — G4733 Obstructive sleep apnea (adult) (pediatric): Secondary | ICD-10-CM | POA: Insufficient documentation

## 2024-03-30 DIAGNOSIS — E039 Hypothyroidism, unspecified: Secondary | ICD-10-CM | POA: Diagnosis not present

## 2024-03-30 DIAGNOSIS — I2510 Atherosclerotic heart disease of native coronary artery without angina pectoris: Secondary | ICD-10-CM | POA: Insufficient documentation

## 2024-03-30 DIAGNOSIS — I110 Hypertensive heart disease with heart failure: Secondary | ICD-10-CM | POA: Diagnosis not present

## 2024-03-30 DIAGNOSIS — M1990 Unspecified osteoarthritis, unspecified site: Secondary | ICD-10-CM | POA: Insufficient documentation

## 2024-03-30 DIAGNOSIS — E782 Mixed hyperlipidemia: Secondary | ICD-10-CM | POA: Insufficient documentation

## 2024-03-30 DIAGNOSIS — Z87891 Personal history of nicotine dependence: Secondary | ICD-10-CM | POA: Diagnosis not present

## 2024-03-30 DIAGNOSIS — I25118 Atherosclerotic heart disease of native coronary artery with other forms of angina pectoris: Secondary | ICD-10-CM | POA: Diagnosis not present

## 2024-03-30 DIAGNOSIS — I5032 Chronic diastolic (congestive) heart failure: Secondary | ICD-10-CM | POA: Diagnosis not present

## 2024-03-30 HISTORY — DX: Obstructive sleep apnea (adult) (pediatric): G47.33

## 2024-03-30 HISTORY — DX: Other ill-defined heart diseases: I51.89

## 2024-03-30 HISTORY — PX: CATARACT EXTRACTION W/PHACO: SHX586

## 2024-03-30 SURGERY — PHACOEMULSIFICATION, CATARACT, WITH IOL INSERTION
Anesthesia: Monitor Anesthesia Care | Site: Eye | Laterality: Right

## 2024-03-30 MED ORDER — MOXIFLOXACIN HCL 0.5 % OP SOLN
OPHTHALMIC | Status: DC | PRN
  Administered 2024-03-30: .2 mL via OPHTHALMIC

## 2024-03-30 MED ORDER — LIDOCAINE HCL (PF) 2 % IJ SOLN
INTRAOCULAR | Status: DC | PRN
  Administered 2024-03-30: 2 mL

## 2024-03-30 MED ORDER — MIDAZOLAM HCL 2 MG/2ML IJ SOLN
INTRAMUSCULAR | Status: AC
  Filled 2024-03-30: qty 2

## 2024-03-30 MED ORDER — ARMC OPHTHALMIC DILATING DROPS
OPHTHALMIC | Status: AC
  Filled 2024-03-30: qty 0.5

## 2024-03-30 MED ORDER — LACTATED RINGERS IV SOLN: INTRAVENOUS | Status: DC

## 2024-03-30 MED ORDER — MIDAZOLAM HCL 2 MG/2ML IJ SOLN
INTRAMUSCULAR | Status: DC | PRN
  Administered 2024-03-30 (×2): 1 mg via INTRAVENOUS

## 2024-03-30 MED ORDER — BRIMONIDINE TARTRATE-TIMOLOL 0.2-0.5 % OP SOLN
OPHTHALMIC | Status: DC | PRN
  Administered 2024-03-30: 1 [drp] via OPHTHALMIC

## 2024-03-30 MED ORDER — FENTANYL CITRATE (PF) 100 MCG/2ML IJ SOLN
INTRAMUSCULAR | Status: DC | PRN
  Administered 2024-03-30: 50 ug via INTRAVENOUS

## 2024-03-30 MED ORDER — SIGHTPATH DOSE#1 NA CHONDROIT SULF-NA HYALURON 40-17 MG/ML IO SOLN
INTRAOCULAR | Status: DC | PRN
Start: 2024-03-30 — End: 2024-03-30
  Administered 2024-03-30: 1 mL via INTRAOCULAR

## 2024-03-30 MED ORDER — TETRACAINE HCL 0.5 % OP SOLN
1.0000 [drp] | OPHTHALMIC | Status: DC | PRN
  Administered 2024-03-30 (×3): 1 [drp] via OPHTHALMIC

## 2024-03-30 MED ORDER — TETRACAINE HCL 0.5 % OP SOLN
OPHTHALMIC | Status: AC
  Filled 2024-03-30: qty 4

## 2024-03-30 MED ORDER — FENTANYL CITRATE (PF) 100 MCG/2ML IJ SOLN
INTRAMUSCULAR | Status: AC
  Filled 2024-03-30: qty 2

## 2024-03-30 MED ORDER — SIGHTPATH DOSE#1 BSS IO SOLN
INTRAOCULAR | Status: DC | PRN
  Administered 2024-03-30: 15 mL via INTRAOCULAR

## 2024-03-30 MED ORDER — ARMC OPHTHALMIC DILATING DROPS
1.0000 | OPHTHALMIC | Status: DC | PRN
  Administered 2024-03-30 (×3): 1 via OPHTHALMIC

## 2024-03-30 MED ORDER — SIGHTPATH DOSE#1 BSS IO SOLN
INTRAOCULAR | Status: DC | PRN
  Administered 2024-03-30: 58 mL via OPHTHALMIC

## 2024-03-30 SURGICAL SUPPLY — 10 items
CATARACT SUITE SIGHTPATH (MISCELLANEOUS) ×1 IMPLANT
CYSTOTOME ANGL RVRS SHRT 25G (CUTTER) ×1 IMPLANT
FEE CATARACT SUITE SIGHTPATH (MISCELLANEOUS) ×1 IMPLANT
GLOVE BIOGEL PI IND STRL 8 (GLOVE) ×1 IMPLANT
GLOVE SURG LX STRL 8.0 MICRO (GLOVE) ×1 IMPLANT
GLOVE SURG SYN 6.5 PF PI BL (GLOVE) ×1 IMPLANT
LENS IOL TECNIS EYHANCE 15.5 (Intraocular Lens) IMPLANT
NDL FILTER BLUNT 18X1 1/2 (NEEDLE) ×1 IMPLANT
NEEDLE FILTER BLUNT 18X1 1/2 (NEEDLE) ×1 IMPLANT
SYR 3ML LL SCALE MARK (SYRINGE) ×1 IMPLANT

## 2024-03-30 NOTE — Op Note (Signed)
 PREOPERATIVE DIAGNOSIS:  Nuclear sclerotic cataract of the right eye.   POSTOPERATIVE DIAGNOSIS:  Right Eye Cataract   OPERATIVE PROCEDURE:ORPROCALL@   SURGEON:  Elsie Carmine, MD.   ANESTHESIA:  Anesthesiologist: Ola Donny BROCKS, MD CRNA: Jahoo, Sonia, CRNA  1.      Managed anesthesia care. 2.      0.39ml of Shugarcaine was instilled in the eye following the paracentesis.   COMPLICATIONS:  None.   TECHNIQUE:   Stop and chop   DESCRIPTION OF PROCEDURE:  The patient was examined and consented in the preoperative holding area where the aforementioned topical anesthesia was applied to the right eye and then brought back to the Operating Room where the right eye was prepped and draped in the usual sterile ophthalmic fashion and a lid speculum was placed. A paracentesis was created with the side port blade and the anterior chamber was filled with viscoelastic. A near clear corneal incision was performed with the steel keratome. A continuous curvilinear capsulorrhexis was performed with a cystotome followed by the capsulorrhexis forceps. Hydrodissection and hydrodelineation were carried out with BSS on a blunt cannula. The lens was removed in a stop and chop  technique and the remaining cortical material was removed with the irrigation-aspiration handpiece. The capsular bag was inflated with viscoelastic and the Technis [id number]  lens was placed in the capsular bag without complication. The remaining viscoelastic was removed from the eye with the irrigation-aspiration handpiece. The wounds were hydrated. The anterior chamber was flushed with BSS and the eye was inflated to physiologic pressure. 0.58ml of Vigamox  was placed in the anterior chamber. The wounds were found to be water  tight. The eye was dressed with Combigan . The patient was given protective glasses to wear throughout the day and a shield with which to sleep tonight. The patient was also given drops with which to begin a drop regimen today  and will follow-up with me in one day. Implant Name Type Inv. Item Serial No. Manufacturer Lot No. LRB No. Used Action  LENS IOL TECNIS EYHANCE 15.5 - [id number] Intraocular Lens LENS IOL TECNIS EYHANCE 15.5 [id number] [employer]  Right 1 Implanted   Procedure(s): PHACOEMULSIFICATION, CATARACT, WITH IOL INSERTION 8.48 00:49.7 (Right)  Electronically signed: Elsie Carmine 03/30/2024 10:54 AM

## 2024-03-30 NOTE — Transfer of Care (Signed)
 Immediate Anesthesia Transfer of Care Note  Patient: [name]  Procedure(s) Performed: PHACOEMULSIFICATION, CATARACT, WITH IOL INSERTION (Right)  Patient Location: PACU  Anesthesia Type: MAC  Level of Consciousness: awake, alert  and patient cooperative  Airway and Oxygen Therapy: Patient Spontanous Breathing and Patient connected to supplemental oxygen  Post-op Assessment: Post-op Vital signs reviewed, Patient's Cardiovascular Status Stable, Respiratory Function Stable, Patent Airway and No signs of Nausea or vomiting  Post-op Vital Signs: Reviewed and stable  Complications: No notable events documented.

## 2024-03-30 NOTE — Anesthesia Postprocedure Evaluation (Signed)
 Anesthesia Post Note  Patient: [name]  Procedure(s) Performed: PHACOEMULSIFICATION, CATARACT, WITH IOL INSERTION 8.48 00:49.7 (Right: Eye)  Patient location during evaluation: PACU Anesthesia Type: MAC Level of consciousness: awake and alert Pain management: pain level controlled Vital Signs Assessment: post-procedure vital signs reviewed and stable Respiratory status: spontaneous breathing, nonlabored ventilation, respiratory function stable and patient connected to nasal cannula oxygen Cardiovascular status: stable and blood pressure returned to baseline Postop Assessment: no apparent nausea or vomiting Anesthetic complications: no   No notable events documented.   Last Vitals:  Vitals:   03/30/24 1055 03/30/24 1101  BP: 126/84   Pulse: 73   Resp: 10   Temp: 36.4 C (!) 36.4 C  SpO2: 95%     Last Pain:  Vitals:   03/30/24 1101  TempSrc:   PainSc: 0-No pain                 Shaune Westfall C Danilynn Jemison
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
## 2024-04-04 ENCOUNTER — Other Ambulatory Visit: Payer: Self-pay | Admitting: Internal Medicine

## 2024-04-04 ENCOUNTER — Encounter: Payer: Self-pay | Admitting: Internal Medicine

## 2024-04-05 ENCOUNTER — Other Ambulatory Visit (HOSPITAL_COMMUNITY): Payer: Self-pay

## 2024-04-05 ENCOUNTER — Other Ambulatory Visit: Payer: Self-pay

## 2024-04-05 MED ORDER — BUPROPION HCL ER (XL) 300 MG PO TB24
300.0000 mg | ORAL_TABLET | Freq: Every morning | ORAL | 3 refills | Status: AC
  Filled 2024-04-05: qty 90, 90d supply, fill #0
  Filled 2024-06-29: qty 90, 90d supply, fill #1
  Filled 2024-09-20 – 2024-09-29 (×2): qty 90, 90d supply, fill #2

## 2024-04-05 MED ORDER — METHYLPHENIDATE HCL 10 MG PO TABS
10.0000 mg | ORAL_TABLET | Freq: Two times a day (BID) | ORAL | 0 refills | Status: DC
  Filled 2024-04-05: qty 60, 30d supply, fill #0

## 2024-04-05 MED ORDER — METOPROLOL SUCCINATE ER 25 MG PO TB24
25.0000 mg | ORAL_TABLET | Freq: Every day | ORAL | 1 refills | Status: DC
  Filled 2024-04-05: qty 90, 90d supply, fill #0
  Filled 2024-06-29: qty 90, 90d supply, fill #1

## 2024-04-05 NOTE — Telephone Encounter (Signed)
 Last written 03-05-24 #60 Last OV 03-25-24 Next OV 03-28-25 [facility]

## 2024-04-06 ENCOUNTER — Ambulatory Visit (INDEPENDENT_AMBULATORY_CARE_PROVIDER_SITE_OTHER): Admitting: Clinical

## 2024-04-06 DIAGNOSIS — F419 Anxiety disorder, unspecified: Secondary | ICD-10-CM

## 2024-04-06 DIAGNOSIS — F331 Major depressive disorder, recurrent, moderate: Secondary | ICD-10-CM | POA: Diagnosis not present

## 2024-04-06 NOTE — Progress Notes (Signed)
   Kevin Seats, LCSW
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
## 2024-04-11 ENCOUNTER — Emergency Department
Admission: EM | Admit: 2024-04-11 | Discharge: 2024-04-11 | Disposition: A | Discharge status: Home / Self Care | Attending: Emergency Medicine | Admitting: Emergency Medicine

## 2024-04-11 ENCOUNTER — Other Ambulatory Visit: Payer: Self-pay

## 2024-04-11 ENCOUNTER — Emergency Department

## 2024-04-11 DIAGNOSIS — N132 Hydronephrosis with renal and ureteral calculous obstruction: Secondary | ICD-10-CM | POA: Diagnosis not present

## 2024-04-11 DIAGNOSIS — N134 Hydroureter: Secondary | ICD-10-CM | POA: Insufficient documentation

## 2024-04-11 DIAGNOSIS — I2510 Atherosclerotic heart disease of native coronary artery without angina pectoris: Secondary | ICD-10-CM | POA: Insufficient documentation

## 2024-04-11 DIAGNOSIS — I10 Essential (primary) hypertension: Secondary | ICD-10-CM | POA: Diagnosis not present

## 2024-04-11 DIAGNOSIS — N200 Calculus of kidney: Secondary | ICD-10-CM | POA: Diagnosis not present

## 2024-04-11 DIAGNOSIS — R109 Unspecified abdominal pain: Secondary | ICD-10-CM | POA: Diagnosis not present

## 2024-04-11 LAB — URINALYSIS, ROUTINE W REFLEX MICROSCOPIC
Bilirubin Urine: NEGATIVE
Glucose, UA: NEGATIVE mg/dL
Ketones, ur: NEGATIVE mg/dL
Leukocytes,Ua: NEGATIVE
Nitrite: NEGATIVE
Protein, ur: NEGATIVE mg/dL
RBC / HPF: >50 RBC/hpf (ref 0–5)
Specific Gravity, Urine: 1.011 (ref 1.005–1.030)
pH: 7 (ref 5.0–8.0)

## 2024-04-11 LAB — HEPATIC FUNCTION PANEL
ALT: 34 U/L (ref 0–44)
AST: 24 U/L (ref 15–41)
Albumin: 3.8 g/dL (ref 3.5–5.0)
Alkaline Phosphatase: 105 U/L (ref 38–126)
Bilirubin, Direct: 0.1 mg/dL (ref 0.0–0.2)
Indirect Bilirubin: 0.6 mg/dL (ref 0.3–0.9)
Total Bilirubin: 0.7 mg/dL (ref 0.0–1.2)
Total Protein: 7.5 g/dL (ref 6.5–8.1)

## 2024-04-11 LAB — BASIC METABOLIC PANEL WITH GFR
Anion gap: 10 (ref 5–15)
BUN: 25 mg/dL — ABNORMAL HIGH (ref 8–23)
CO2: 27 mmol/L (ref 22–32)
Calcium: 8.9 mg/dL (ref 8.9–10.3)
Chloride: 105 mmol/L (ref 98–111)
Creatinine, Ser: 0.93 mg/dL (ref 0.61–1.24)
GFR, Estimated: >60 mL/min (ref >60)
Glucose, Bld: 97 mg/dL (ref 70–99)
Potassium: 4 mmol/L (ref 3.5–5.1)
Sodium: 142 mmol/L (ref 135–145)

## 2024-04-11 LAB — CBC
HCT: 43.9 % (ref 39.0–52.0)
Hemoglobin: 14 g/dL (ref 13.0–17.0)
MCH: 26.7 pg (ref 26.0–34.0)
MCHC: 31.9 g/dL (ref 30.0–36.0)
MCV: 83.8 fL (ref 80.0–100.0)
Platelets: 216 K/uL (ref 150–400)
RBC: 5.24 MIL/uL (ref 4.22–5.81)
RDW: 16.3 % — ABNORMAL HIGH (ref 11.5–15.5)
WBC: 7.7 K/uL (ref 4.0–10.5)
nRBC: 0 % (ref 0.0–0.2)

## 2024-04-11 LAB — LIPASE, BLOOD: Lipase: 32 U/L (ref 11–51)

## 2024-04-11 MED ORDER — TAMSULOSIN HCL 0.4 MG PO CAPS: 0.4000 mg | ORAL_CAPSULE | Freq: Every day | ORAL | 0 refills | Status: DC

## 2024-04-11 NOTE — ED Provider Notes (Signed)
 [facility] Provider Note    Event Date/Time   First MD Initiated Contact with Patient 04/11/24 1349     (approximate)   History   Flank Pain   HPI  [name] is a 68 y.o. male with history of hypertension, CAD, hyperlipidemia, and as listed in EMR presents to the emergency department for treatment evaluation of left flank pain that started this morning and has worsened over the past hour and a half.  History of kidney stones.  Currently no nausea and denies vomiting or diarrhea.  No dysuria or fever..     Physical Exam    Vitals:   04/11/24 1356 04/11/24 1653  BP: 125/72 125/67  Pulse: (!) 57 62  Resp: 16 17  SpO2: 100% 98%    General: Awake, no distress.  CV:  Good peripheral perfusion.  Resp:  Normal effort.  Abd:  No distention.  Other:     ED Results / Procedures / Treatments   Labs (all labs ordered are listed, but only abnormal results are displayed)  Labs Reviewed  URINALYSIS, ROUTINE W REFLEX MICROSCOPIC - Abnormal; Notable for the following components:      Result Value   Color, Urine YELLOW (*)    APPearance CLEAR (*)    Hgb urine dipstick LARGE (*)    Bacteria, UA RARE (*)    All other components within normal limits  BASIC METABOLIC PANEL WITH GFR - Abnormal; Notable for the following components:   BUN 25 (*)    All other components within normal limits  CBC - Abnormal; Notable for the following components:   RDW 16.3 (*)    All other components within normal limits  HEPATIC FUNCTION PANEL  LIPASE, BLOOD     EKG  Not indicated   RADIOLOGY  Image and radiology report reviewed and interpreted by me. Radiology report consistent with the same.  CT renal stone study shows mild left hydroureteronephrosis with tiny obstructing calculus at the left UVJ.  Additional calculus within the lumen of the bladder.  Nonobstructing left renal calculus and no right nephrolithiasis or  ureterolithiasis  PROCEDURES:  Critical Care performed: No  Procedures   MEDICATIONS ORDERED IN ED:  Medications - No data to display   IMPRESSION / MDM / ASSESSMENT AND PLAN / ED COURSE   I have reviewed the triage note and vital signs. Vital signs stable   Differential diagnosis includes, but is not limited to, kidney stone, pyelonephritis, musculoskeletal strain  Patient's presentation is most consistent with acute illness / injury with system symptoms.  68 year old male presenting to the emergency department for treatment and evaluation of acute onset left flank pain that feels similar to previous kidney stones.  See HPI for further details.  CT scan shows small obstructing calculus at the left UVJ.  Urinalysis has not yet been collected.  Care transferred to Devere Perry, PA-C who will follow up on results to determine if antibiotics are indicated.  Results  the CT were discussed with the patient and his wife.      FINAL CLINICAL IMPRESSION(S) / ED DIAGNOSES   Final diagnoses:  Kidney stone     Rx / DC Orders   ED Discharge Orders          Ordered    tamsulosin  (FLOMAX ) 0.4 MG CAPS capsule  Daily        04/11/24 1646             Note:  This document was  prepared using Conservation officer, historic buildings and may include unintentional dictation errors.   Herlinda Kirk NOVAK, FNP 04/13/24 ARTEMUS Dicky Anes, MD 04/15/24 1409

## 2024-04-11 NOTE — Discharge Instructions (Addendum)
 Follow-up with your regular doctor as needed.  Follow-up with Dr. Quilla for recheck of the kidney stone.  Be sure to strain your urine.  If you catch the stone please save it for Dr. Quilla.  Return the emergency department if you are worsening.  Tylenol  or ibuprofen for pain.

## 2024-04-11 NOTE — ED Provider Notes (Signed)
  Physical Exam  BP 125/72 (BP Location: Right Arm)   Pulse (!) 57   Resp 16   Ht 5' 10 (1.778 m)   Wt 122 kg   SpO2 100%   BMI 38.59 kg/m   Physical Exam  Procedures  Procedures  ED Course / MDM    Medical Decision Making Amount and/or Complexity of Data Reviewed Labs: ordered. Radiology: ordered.  Risk Prescription drug management.   Assuming care of the patient from Elenor Landry Gentry, FNP.  In short we are awaiting urinalysis to make sure there is no infection.  If it is negative for infection we will be able to discharge patient with diagnosis of kidney stone.  UA is reassuring  I did discuss this with the patient.  Explained to him we do not need to place him on antibiotic.  I did offer pain medication which she is deferring at this time.  He was given a prescription for tamsulosin  to help him with urination with the kidney stone.  Was also given a strainer.  Follow-up with his urologist, Dr. Quilla.  He was discharged stable condition.  Is in agreement with treatment plan.       Gasper Devere ORN, PA-C 04/11/24 1648    Suzanne Kirsch, MD 04/11/24 2030

## 2024-04-11 NOTE — ED Notes (Signed)
 See triage note  Presents with sudden onset of pain to left flank and lateral rib/abd area. States pain started around 10 this am  Became nauseated and vomited

## 2024-04-11 NOTE — ED Triage Notes (Signed)
 To ED POV for L flank pain since this AM, worse since 1.5hr ago. Hx kidney stones. Appears to be in significant pain. Skin is dry.

## 2024-04-13 ENCOUNTER — Other Ambulatory Visit: Payer: Self-pay | Admitting: Urology

## 2024-04-13 DIAGNOSIS — N200 Calculus of kidney: Secondary | ICD-10-CM
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
## 2024-04-15 ENCOUNTER — Ambulatory Visit
Admission: RE | Admit: 2024-04-15 | Discharge: 2024-04-15 | Disposition: A | Discharge status: Home / Self Care | Source: Ambulatory Visit | Attending: Urology | Admitting: Urology

## 2024-04-15 ENCOUNTER — Ambulatory Visit
Admission: RE | Admit: 2024-04-15 | Discharge: 2024-04-15 | Disposition: A | Discharge status: Home / Self Care | Attending: Urology | Admitting: Urology

## 2024-04-15 ENCOUNTER — Encounter: Payer: Self-pay | Admitting: Urology

## 2024-04-15 ENCOUNTER — Other Ambulatory Visit: Payer: Self-pay

## 2024-04-15 ENCOUNTER — Ambulatory Visit (INDEPENDENT_AMBULATORY_CARE_PROVIDER_SITE_OTHER): Admitting: Urology

## 2024-04-15 ENCOUNTER — Other Ambulatory Visit (HOSPITAL_COMMUNITY): Payer: Self-pay

## 2024-04-15 VITALS — BP 106/70 | HR 58 | Ht 70.0 in | Wt 271.0 lb

## 2024-04-15 DIAGNOSIS — N529 Male erectile dysfunction, unspecified: Secondary | ICD-10-CM

## 2024-04-15 DIAGNOSIS — E291 Testicular hypofunction: Secondary | ICD-10-CM | POA: Diagnosis not present

## 2024-04-15 DIAGNOSIS — N200 Calculus of kidney: Secondary | ICD-10-CM | POA: Insufficient documentation

## 2024-04-15 DIAGNOSIS — N201 Calculus of ureter: Secondary | ICD-10-CM | POA: Diagnosis not present

## 2024-04-15 DIAGNOSIS — R109 Unspecified abdominal pain: Secondary | ICD-10-CM | POA: Diagnosis not present

## 2024-04-15 DIAGNOSIS — R319 Hematuria, unspecified: Secondary | ICD-10-CM | POA: Diagnosis not present

## 2024-04-15 MED ORDER — SYRINGE 2-3 ML 3 ML MISC
1.0000 mg | 3 refills | Status: AC
  Filled 2024-04-15: qty 25, 350d supply, fill #0

## 2024-04-15 MED ORDER — TESTOSTERONE CYPIONATE 200 MG/ML IM SOLN
200.0000 mg | INTRAMUSCULAR | 0 refills | Status: DC
  Filled 2024-04-15: qty 7, 98d supply, fill #0

## 2024-04-15 MED ORDER — "BD DISP NEEDLES 18G X 1-1/2"" MISC"
1.0000 mg | 0 refills | Status: DC
  Filled 2024-04-15: qty 25, 350d supply, fill #0

## 2024-04-15 MED ORDER — "BD DISP NEEDLES 21G X 1-1/2"" MISC"
1.0000 mg | 0 refills | Status: DC
  Filled 2024-04-15: qty 25, 350d supply, fill #0

## 2024-04-19 ENCOUNTER — Other Ambulatory Visit: Payer: Self-pay

## 2024-04-19 ENCOUNTER — Other Ambulatory Visit (HOSPITAL_COMMUNITY): Payer: Self-pay

## 2024-04-19 ENCOUNTER — Other Ambulatory Visit: Payer: Self-pay | Admitting: Internal Medicine

## 2024-04-20 ENCOUNTER — Other Ambulatory Visit (HOSPITAL_COMMUNITY): Payer: Self-pay

## 2024-04-20 ENCOUNTER — Other Ambulatory Visit: Payer: Self-pay

## 2024-04-20 ENCOUNTER — Ambulatory Visit (INDEPENDENT_AMBULATORY_CARE_PROVIDER_SITE_OTHER): Admitting: Clinical

## 2024-04-20 DIAGNOSIS — F331 Major depressive disorder, recurrent, moderate: Secondary | ICD-10-CM

## 2024-04-20 DIAGNOSIS — F419 Anxiety disorder, unspecified: Secondary | ICD-10-CM | POA: Diagnosis not present

## 2024-04-20 MED ORDER — FUROSEMIDE 20 MG PO TABS
ORAL_TABLET | ORAL | 3 refills | Status: AC
  Filled 2024-04-20: qty 90, 45d supply, fill #0
  Filled 2024-06-03: qty 90, 45d supply, fill #1
  Filled 2024-07-19: qty 90, 45d supply, fill #2
  Filled 2024-08-30: qty 90, 45d supply, fill #3

## 2024-04-20 NOTE — Progress Notes (Signed)
   Darice Seats, LCSW
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
## 2024-04-27 ENCOUNTER — Encounter (INDEPENDENT_AMBULATORY_CARE_PROVIDER_SITE_OTHER): Payer: Self-pay | Admitting: Internal Medicine

## 2024-04-27 ENCOUNTER — Ambulatory Visit (INDEPENDENT_AMBULATORY_CARE_PROVIDER_SITE_OTHER): Admitting: Internal Medicine

## 2024-04-27 VITALS — BP 117/78 | HR 56 | Temp 97.9°F | Ht 70.0 in | Wt 268.0 lb

## 2024-04-27 DIAGNOSIS — I25118 Atherosclerotic heart disease of native coronary artery with other forms of angina pectoris: Secondary | ICD-10-CM

## 2024-04-27 DIAGNOSIS — E66812 Obesity, class 2: Secondary | ICD-10-CM

## 2024-04-27 DIAGNOSIS — G4733 Obstructive sleep apnea (adult) (pediatric): Secondary | ICD-10-CM

## 2024-04-27 DIAGNOSIS — E88819 Insulin resistance, unspecified: Secondary | ICD-10-CM | POA: Diagnosis not present

## 2024-04-27 DIAGNOSIS — Z6838 Body mass index (BMI) 38.0-38.9, adult: Secondary | ICD-10-CM | POA: Diagnosis not present

## 2024-04-27 NOTE — Assessment & Plan Note (Signed)
 Blood pressure is well-controlled at 117/78 mmHg. He is on metoprolol  and atorvastatin , which are effectively managing blood pressure and cardiovascular health. - Continue metoprolol  and atorvastatin  as prescribed.

## 2024-04-27 NOTE — Assessment & Plan Note (Signed)
 At the beginning of our program he had an insulin  level of 35 consistent with hyperinsulinemia or insulin  resistance.  Follow-up testing shows a reduction to 17.5 overall 50% reduction but still above optimal levels of less than 7.  This is complex condition associated with genetics, ectopic fat and lifestyle factors. Insulin  resistance may result in increased fat storage, inhibition of the breakdown of fat, cause fluctuations in blood sugar leading to energy crashes and increased cravings for sugary or high carb foods and cause metabolic slowdown making it difficult to lose weight.  This may result in additional weight gain and lead to pre-diabetes and diabetes if untreated. In addition, hyperinsulinemia increases cardiovascular risk, chronic inflammatory response and may increase the risk of obesity related malignancies.  Lab Results  Component Value Date   HGBA1C 5.3 06/30/2023   Lab Results  Component Value Date   INSULIN  17.5 06/30/2023   INSULIN  35.5 (H) 07/09/2022   Lab Results  Component Value Date   GLUCOSE 97 04/11/2024   GLUCOSE 76 12/14/2014    We reviewed treatment options which include losing 7 to 10% of body weight, increasing volume of physical activity and maintaining a diet low in saturated fats and with a low glycemic load.  Patient has also been educated on the carb insulin  model of obesity.  He has declined pharmacotherapy with metformin and GLP-1.

## 2024-04-27 NOTE — Assessment & Plan Note (Signed)
 On CPAP with reported good compliance. He has lost a total of 20% of total body weight. He uses CPAP consistently and reports inability to sleep without it, indicating ongoing need for CPAP therapy despite significant weight loss. No repeat polysomnography planned as symptoms persist without CPAP. - Continue CPAP therapy.

## 2024-04-27 NOTE — Progress Notes (Signed)
 Office: [phone]  /  Fax: [phone]  Weight Summary and Body Composition Analysis (BIA)  Vitals Temp: 97.9 F (36.6 C) BP: 117/78 Pulse Rate: (!) 56 SpO2: 96 %   Anthropometric Measurements Height: 5' 10 (1.778 m) Weight: 268 lb (121.6 kg) BMI (Calculated): 38.45 Weight at Last Visit: 265 lb Weight Lost Since Last Visit: 0 lb Weight Gained Since Last Visit: 3 lb Starting Weight: 320 lb Total Weight Loss (lbs): 52 lb (23.6 kg) Peak Weight: 382 lb   Body Composition  Body Fat %: 39.5 % Fat Mass (lbs): 106 lbs Muscle Mass (lbs): 154.2 lbs Total Body Water  (lbs): 116.8 lbs Visceral Fat Rating : 25    RMR: 1656  Today's Visit #: 23  Starting Date: 07/10/23   Subjective   Chief Complaint: Obesity  Interval History Discussed the use of AI scribe software for clinical note transcription with the patient, who gave verbal consent to proceed.  History of Present Illness [name] is a 67 year old male who presents for weight management follow-up. He is accompanied by his wife, [name].  He is following a 1500-calorie nutrition plan with suboptimal adherence due to wife's illness and also working on a shed.  He is status post left knee replacement and is doing well.  He has gained three pounds, which he attributes to increased intake of fried foods and dining out. Despite this, he has lost a total of 62.8 pounds since August 2023 through lifestyle changes.  He has declined pharmacotherapy on multiple occasions  He engages in physical activity, exercising three days a week and performing yard work, including building a shed. He feels good with his knee post-surgery, noting significant improvement in pain and stiffness. He was released from care ten weeks post-surgery.  He has a history of kidney stones, with a recent episode where he passed a 4mm stone. No additional symptoms such as hematuria or dysuria have been experienced since then. He recalls a previous  severe episode at age 12, which was more painful than the recent one.     Challenges affecting patient progress: medical comorbidities, recent lapse in weight management plan due to work, travel, illness or family circumstances, and all-or- none mindset.    Pharmacotherapy for weight management: He is currently taking no anti-obesity medication.   Assessment and Plan   Treatment Plan For Obesity:  Recommended Dietary Goals  [name] is currently in the action stage of change. As such, his goal is to continue weight management plan. He has agreed to: continue current plan  Behavioral Health and Counseling  We discussed the following behavioral modification strategies today: continue to work on maintaining a reduced calorie state, getting the recommended amount of protein, incorporating whole foods, making healthy choices, staying well hydrated and practicing mindfulness when eating., increase protein intake, fibrous foods (25 grams per day for women, 30 grams for men) and water  to improve satiety and decrease hunger signals. , and getting back on track after recent relapse.  Additional education and resources provided today: None  Recommended Physical Activity Goals  [name] has been advised to work up to 150 minutes of moderate intensity aerobic activity a week and strengthening exercises 2-3 times per week for cardiovascular health, weight loss maintenance and preservation of muscle mass.  He has agreed to :  Increase volume of physical activity to a goal of 240 minutes a week and Combine aerobic and strengthening exercises for efficiency and improved cardiometabolic health.  Medical Interventions and Pharmacotherapy  We  discussed various medication options to help Kevin with his weight loss efforts and we both agreed to : Continue with current nutritional and behavioral strategies and Has declined pharmacotherapy  Associated Conditions Impacted by Obesity Treatment  Assessment &  Plan Coronary artery disease of native artery of native heart with stable angina pectoris (HCC) Blood pressure is well-controlled at 117/78 mmHg. He is on metoprolol  and atorvastatin , which are effectively managing blood pressure and cardiovascular health. - Continue metoprolol  and atorvastatin  as prescribed. OSA on CPAP On CPAP with reported good compliance. He has lost a total of 20% of total body weight. He uses CPAP consistently and reports inability to sleep without it, indicating ongoing need for CPAP therapy despite significant weight loss. No repeat polysomnography planned as symptoms persist without CPAP. - Continue CPAP therapy.  Insulin  resistance At the beginning of our program he had an insulin  level of 35 consistent with hyperinsulinemia or insulin  resistance.  Follow-up testing shows a reduction to 17.5 overall 50% reduction but still above optimal levels of less than 7.  This is complex condition associated with genetics, ectopic fat and lifestyle factors. Insulin  resistance may result in increased fat storage, inhibition of the breakdown of fat, cause fluctuations in blood sugar leading to energy crashes and increased cravings for sugary or high carb foods and cause metabolic slowdown making it difficult to lose weight.  This may result in additional weight gain and lead to pre-diabetes and diabetes if untreated. In addition, hyperinsulinemia increases cardiovascular risk, chronic inflammatory response and may increase the risk of obesity related malignancies.  Lab Results  Component Value Date   HGBA1C 5.3 06/30/2023   Lab Results  Component Value Date   INSULIN  17.5 06/30/2023   INSULIN  35.5 (H) 07/09/2022   Lab Results  Component Value Date   GLUCOSE 97 04/11/2024   GLUCOSE 76 12/14/2014    We reviewed treatment options which include losing 7 to 10% of body weight, increasing volume of physical activity and maintaining a diet low in saturated fats and with a low  glycemic load.  Patient has also been educated on the carb insulin  model of obesity.  He has declined pharmacotherapy with metformin and GLP-1.   Class 2 severe obesity with serious comorbidity and body mass index (BMI) of 38.0 to 38.9 in adult, unspecified obesity type (HCC) Weight: decrease of 65.8 lb (19.9%) over 1 year, 10 months  Start: 04/22/2022 330 lb 12.8 oz (150 kg) (H)  End: 03/18/2024 265 lb (120.2 kg)  With nutritional and behavioral strategies.  Obesity with a BMI of 38. Significant weight loss of 62.8 pounds (approximately 20% of body weight) through lifestyle changes. Recent weight gain of 3 pounds attributed to increased muscle mass and water  retention. Approaching a plateau phase. Discussed GLP-1 receptor agonist [facility]) as a potential treatment option for additional weight loss and health benefits, but he is hesitant due to long-term commitment concerns. Benefits include reduced risk of heart attack, stroke, diabetes, and potential treatment for fatty liver disease. Long-term use is necessary to maintain benefits. - Continue current lifestyle modifications focusing on high fiber and moderate protein diet. - Encourage continued physical activity, including yard work and shed building. - Discuss potential use of GLP-1 receptor agonist [facility]) for additional weight loss and health benefits, but respect his current decision to decline. - Set a calorie target of 1500-1700 calories per day to aim for a weight loss of about 1 pound per week.        Objective  Physical Exam:  Blood pressure 117/78, pulse (!) 56, temperature 97.9 F (36.6 C), height 5' 10 (1.778 m), weight 268 lb (121.6 kg), SpO2 96%. Body mass index is 38.45 kg/m.  General: He is overweight, cooperative, alert, well developed, and in no acute distress. PSYCH: Has normal mood, affect and thought process.   HEENT: EOMI, sclerae are anicteric. Lungs: Normal breathing effort, no conversational  dyspnea. Extremities: No edema.  Neurologic: No gross sensory or motor deficits. No tremors or fasciculations noted.    Diagnostic Data Reviewed:  BMET    Component Value Date/Time   NA 142 04/11/2024 1242   NA 143 10/08/2022 1226   K 4.0 04/11/2024 1242   CL 105 04/11/2024 1242   CO2 27 04/11/2024 1242   GLUCOSE 97 04/11/2024 1242   BUN 25 (H) 04/11/2024 1242   BUN 22 10/08/2022 1226   CREATININE 0.93 04/11/2024 1242   CALCIUM  8.9 04/11/2024 1242   GFRNONAA >60 04/11/2024 1242   GFRAA >60 02/10/2020 0937   Lab Results  Component Value Date   HGBA1C 5.3 06/30/2023   HGBA1C 5.4 07/09/2022   Lab Results  Component Value Date   INSULIN  17.5 06/30/2023   INSULIN  35.5 (H) 07/09/2022   Lab Results  Component Value Date   TSH 2.41 03/06/2023   CBC    Component Value Date/Time   WBC 7.7 04/11/2024 1242   RBC 5.24 04/11/2024 1242   HGB 14.0 04/11/2024 1242   HGB 14.5 10/30/2023 0850   HCT 43.9 04/11/2024 1242   HCT 43.9 10/30/2023 0850   PLT 216 04/11/2024 1242   PLT 239 11/23/2021 1024   MCV 83.8 04/11/2024 1242   MCV 87 11/23/2021 1024   MCH 26.7 04/11/2024 1242   MCHC 31.9 04/11/2024 1242   RDW 16.3 (H) 04/11/2024 1242   RDW 13.4 11/23/2021 1024   Iron Studies No results found for: IRON, TIBC, FERRITIN, IRONPCTSAT Lipid Panel     Component Value Date/Time   CHOL 60 03/18/2023 0635   CHOL 82 (L) 07/09/2022 0913   TRIG 56 03/18/2023 0635   HDL 20 (L) 03/18/2023 0635   HDL 28 (L) 07/09/2022 0913   CHOLHDL 3.0 03/18/2023 0635   VLDL 11 03/18/2023 0635   LDLCALC 29 03/18/2023 0635   LDLCALC 33 07/09/2022 0913   Hepatic Function Panel     Component Value Date/Time   PROT 7.5 04/11/2024 1242   PROT 6.7 10/08/2022 1226   ALBUMIN 3.8 04/11/2024 1242   ALBUMIN 4.1 10/08/2022 1226   AST 24 04/11/2024 1242   ALT 34 04/11/2024 1242   ALKPHOS 105 04/11/2024 1242   BILITOT 0.7 04/11/2024 1242   BILITOT 0.3 10/08/2022 1226   BILIDIR 0.1 04/11/2024  1242   BILIDIR <0.10 02/13/2021 0830   IBILI 0.6 04/11/2024 1242      Component Value Date/Time   TSH 2.41 03/06/2023 1552   Nutritional Lab Results  Component Value Date   VD25OH 61.4 10/08/2022   VD25OH 18.2 (L) 07/09/2022    Medications: Outpatient Encounter Medications as of 04/27/2024  Medication Sig   amLODipine  (NORVASC ) 5 MG tablet Take 1 tablet (5 mg total) by mouth daily.   aspirin  81 MG chewable tablet Chew 1 tablet (81 mg total) by mouth 2 (two) times daily.   atorvastatin  (LIPITOR) 40 MG tablet Take 1 tablet (40 mg total) by mouth daily.   buPROPion  (WELLBUTRIN  XL) 300 MG 24 hr tablet Take 1 tablet (300 mg total) by mouth every morning.   Cholecalciferol (  VITAMIN D3) 50 MCG (2000 UT) capsule Take 1 capsule (2,000 Units total) by mouth daily.   cyanocobalamin  (VITAMIN B12) 1000 MCG tablet Take 1 tablet (1,000 mcg total) by mouth daily.   DULoxetine  (CYMBALTA ) 60 MG capsule TAKE 1 CAPSULE EVERY       MORNING   furosemide  (LASIX ) 20 MG tablet Take 1 tablet (20mg ) by mouth daily AND take 1 additional tablet (20mg ) by mouth as needed for excess fluid).   gabapentin  (NEURONTIN ) 600 MG tablet Take 2 tablets (1,200 mg total) by mouth at bedtime.   isosorbide  mononitrate (IMDUR ) 60 MG 24 hr tablet Take 1 tablet (60 mg total) by mouth 2 (two) times daily.   levothyroxine  (SYNTHROID ) 88 MCG tablet Take 1 tablet (88 mcg total) by mouth daily before breakfast.   methylphenidate  (RITALIN ) 10 MG tablet Take 1 tablet (10 mg total) by mouth 2 (two) times daily with breakfast and lunch.   metoprolol  succinate (TOPROL -XL) 25 MG 24 hr tablet Take 1 tablet (25 mg total) by mouth daily.   mometasone (NASONEX) 50 MCG/ACT nasal spray Place 2 sprays into the nose daily as needed (allergies).   Multiple Vitamins-Minerals (MULTI FOR HIM 50+) TABS Take 1 tablet by mouth 4 (four) times a week.   NEEDLE, DISP, 18 G (BD DISP NEEDLES) 18G X 1-1/2 MISC use every 14 (fourteen) days for injecting  testosterone    NEEDLE, DISP, 21 G (BD DISP NEEDLES) 21G X 1-1/2 MISC use to inject testosterone  every 14 (fourteen) days.   nitroGLYCERIN  (NITROSTAT ) 0.3 MG SL tablet Place 1 tablet (0.3 mg total) under the tongue every 5 (five) minutes as needed for chest pain. Max of 3 doses within 15 minutes   NON FORMULARY Pt uses c-pap nightly   Syringe, Disposable, (2-3CC SYRINGE) 3 ML MISC Use to inect testosterone  every 14 (fourteen) days.   tamsulosin  (FLOMAX ) 0.4 MG CAPS capsule Take 1 capsule (0.4 mg total) by mouth daily.   testosterone  cypionate (DEPOTESTOSTERONE CYPIONATE) 200 MG/ML injection Inject 1 mL (200 mg total) into the muscle every 14 (fourteen) days.   traZODone  (DESYREL ) 100 MG tablet Take 1-2 tablets (100-200 mg total) by mouth at bedtime.   No facility-administered encounter medications on file as of 04/27/2024.     Follow-Up   Return in about 4 weeks (around 05/25/2024) for For Weight Mangement with Dr. Francyne.SABRA He was informed of the importance of frequent follow up visits to maximize his success with intensive lifestyle modifications for his multiple health conditions.  Attestation Statement   Reviewed by clinician on day of visit: allergies, medications, problem list, medical history, surgical history, family history, social history, and previous encounter notes.     Lucas Francyne, MD

## 2024-04-27 NOTE — Assessment & Plan Note (Signed)
 Weight: decrease of 65.8 lb (19.9%) over 1 year, 10 months  Start: 04/22/2022 330 lb 12.8 oz (150 kg) (H)  End: 03/18/2024 265 lb (120.2 kg)  With nutritional and behavioral strategies.

## 2024-04-29 ENCOUNTER — Ambulatory Visit: Payer: Self-pay | Admitting: Urology

## 2024-04-29 LAB — STONE ANALYSIS
Calcium Oxalate Dihydrate: 70 %
Calcium Oxalate Monohydrate: 10 %
Calcium Phosphate (Hydroxyl): 20 %
Weight Calculi: 36 mg

## 2024-05-03 ENCOUNTER — Other Ambulatory Visit (HOSPITAL_COMMUNITY): Payer: Self-pay

## 2024-05-03 ENCOUNTER — Other Ambulatory Visit: Payer: Self-pay

## 2024-05-05 ENCOUNTER — Ambulatory Visit (INDEPENDENT_AMBULATORY_CARE_PROVIDER_SITE_OTHER): Admitting: Clinical

## 2024-05-05 ENCOUNTER — Ambulatory Visit: Payer: Medicare HMO | Admitting: Dermatology

## 2024-05-05 DIAGNOSIS — F331 Major depressive disorder, recurrent, moderate: Secondary | ICD-10-CM

## 2024-05-05 DIAGNOSIS — F419 Anxiety disorder, unspecified: Secondary | ICD-10-CM | POA: Diagnosis not present

## 2024-05-05 NOTE — Progress Notes (Signed)
   Darice Seats, LCSW

## 2024-05-05 NOTE — Progress Notes (Signed)
 [facility] Counselor/Therapist Progress Note  Patient ID: [name], MRN: [id number],    Date: 05/05/2024  Time Spent: 1:36pm  - 2:21pm : 45 minutes   Treatment Type: Individual Therapy  Reported Symptoms: feeling stressed, disappointment, fatigue  Mental Status Exam: Appearance:  Casual     Behavior: tired  Motor: Normal  Speech/Language:  Clear and Coherent and Normal Rate  Affect: Appropriate  Mood: Patient stated, stressed and disappointment in response to current mood  Thought process: normal  Thought content:   WNL  Sensory/Perceptual disturbances:   WNL  Orientation: oriented to person, place, time/date, and situation  Attention: Good  Concentration: Good  Memory: WNL  Fund of knowledge:  Good  Insight:   Good  Judgment:  Good  Impulse Control: Good   Risk Assessment: Danger to Self:  No Patient denied current suicidal ideation  Self-injurious Behavior: No Danger to Others: No Patient denied current homicidal ideation Duty to Warn:no Physical Aggression / Violence:No  Access to Firearms a concern: No  Gang Involvement:No   Subjective: Patient stated, they've been good, been doing a little better every day in response to events since last session. Patient reported patient has been talking with an individual from patient's church and individual plans to help patient with patient's building tomorrow. Patient stated, that's an improvement on my part in reference to patient reaching out to individual to request assistance with building. Patient stated, if it goes forward and continues I think this will be good thing for me and my mental in reference to building a friendship with individual. Patient stated, the knees are getting stronger every day, mentally I'm feeling better every day in response to recent changes. Patient reported feeling stressed and disappointment today. Patient reported patient took patient's day time meds last night  instead of night time medications, sister scheduled for a meeting to sign documentation tonight at 8 pm and feeling fatigue are triggers for increased stress and feelings of disappointment. Patient stated, I'm just not happy with the eight o'clock part, I don't prefer to be out that late in reference to meeting tonight. Patient stated, I feel like I might end up hurting her feelings in reference to patient's sister. Patient stated, sometimes when we're (patient/sister) around each other we're worse than oil and water . Patient stated, it irritates me that people don't understand what I'm saying the first time. Patient reported feeling sister does not respect others' boundaries. Patient stated, good, is been going really good in response to patient's homework. Patient reported patient has been keeping thought record in patient's phone.  Interventions: Cognitive Behavioral Therapy. Clinician conducted session in person at clinician's office at [facility]. Reviewed events since last session and assessed for changes. Discussed changes in patient's mood and responses since last session. Explored and identified triggers for feeling stressed and disappointment. Assisted patient in generating alternatives as it relates to meeting today. Explored patient's statement regarding hurting sister's feelings. Discussed strategies to decrease stress associated with upcoming meeting with sister, such as, setting expectations with sister ahead of today's meeting, establishing and maintaining boundaries, practicing opposite action. Provided psycho education related to boundaries. Reviewed generating alternatives and challenging negative thoughts. Reviewed patient's homework. Clinician requested for homework patient continue thought record and continue to practice challenging negative thought patterns and practice opposite action.   Collaboration of Care: Other not required at this time   Diagnosis:  Major  depressive disorder, recurrent episode, moderate (HCC)   Anxiety disorder, unspecified type  Plan: Patient is to utilize [employer], thought re-framing, behavioral activation, relaxation techniques, mindfulness and coping strategies to decrease symptoms associated with their diagnosis. Frequency: weekly  Modality: individual      Long-term goal:   Reduce overall level, frequency, and intensity of the feelings of depression and anxiety as evidenced by decreased loss of motivation, loss of interest, psychomotor retardation, worry, lack of energy, difficulty concentrating, depressed mood, difficulty falling asleep and returning to sleep, and inactivity from 7 days/week to 0 to 1 days/week per patient report for at least 3 consecutive months. Target Date: 11/25/24  Progress: progressing    Short-term goal:  Increase patient's participation in physical activities, such as, yard work, walking half way around patient's neighborhood, walking nature trail located near patient's home from 1 time per week to 2 times week Target Date: 05/27/24  Progress: progressing    Increase patient's participation in activities/projects patient enjoys from 0 times per week to 3-4 times per week  Target Date: 05/27/24  Progress: progressing    Identify, challenge, and replace negative core beliefs, thought patterns, and negative self talk that contribute to feelings of depression and anxiety with positive thoughts, beliefs, and positive self talk per patient's report Target Date: 05/27/24  Progress: progressing    Continue to make healthy lifestyle changes, such as, healthy dietary choices and increase physical activity Target Date: 05/27/24  Progress: progressing     Darice Seats, LCSW

## 2024-05-14 DIAGNOSIS — G4733 Obstructive sleep apnea (adult) (pediatric): Secondary | ICD-10-CM | POA: Diagnosis not present

## 2024-05-19 ENCOUNTER — Ambulatory Visit (INDEPENDENT_AMBULATORY_CARE_PROVIDER_SITE_OTHER): Admitting: Clinical

## 2024-05-19 DIAGNOSIS — F419 Anxiety disorder, unspecified: Secondary | ICD-10-CM

## 2024-05-19 DIAGNOSIS — F331 Major depressive disorder, recurrent, moderate: Secondary | ICD-10-CM

## 2024-05-19 NOTE — Progress Notes (Signed)
   Kevin Seats, LCSW

## 2024-05-19 NOTE — Progress Notes (Signed)
 [facility] Counselor/Therapist Progress Note  Patient ID: [name], MRN: [id number],    Date: 05/19/2024  Time Spent: 10:35am - 11:29am : 54 minutes   Treatment Type: Individual Therapy  Reported Symptoms: none reported  Mental Status Exam: Appearance:  Neat and Well Groomed     Behavior: Appropriate  Motor: Normal  Speech/Language:  Clear and Coherent and Normal Rate  Affect: Appropriate  Mood: normal  Thought process: normal  Thought content:   WNL  Sensory/Perceptual disturbances:   WNL  Orientation: oriented to person, place, time/date, and situation  Attention: Good  Concentration: Good  Memory: WNL  Fund of knowledge:  Good  Insight:   Good  Judgment:  Good  Impulse Control: Good   Risk Assessment: Danger to Self:  No Patient denied current suicidal ideation  Self-injurious Behavior: No Danger to Others: No Patient denied current homicidal ideation Duty to Warn:no Physical Aggression / Violence:No  Access to Firearms a concern: No  Gang Involvement:No   Subjective: Patient stated, happy days, wonderful in response to events since last session. Patient reported take a negative and find a way to turn it into a positive in reference to patient's response to situations since last session. Patient stated, my life is just going really good. Patient stated, the depression has seem to let up quite a bit. Patient reported patient is in the process of building a shed and wife has been scheduled for surgery and stated, it has taken a lot off of me in reference to shed and wife's surgery. Patient reported patient's wife focuses on tasks/situations and reported wife's focus impacts patient. Patient stated, I'm learning to deal with it better  and stated, learning that's part of her personality, that's her trait, understanding where it comes from in response to wife's focus. Patient stated, you have to pick your battles, by just sitting down,  calming down, relaxing instead of attacking, by asking questions in response to practicing reframing negative thoughts/situations to a positive. Patient stated, its just wonderful to be doing stuff like that again in reference to building patient's shed. Patient stated, I'm happy that the shed is done and the things I can do. Patient stated, there have been tremendous positive changes. Patient stated, it was lighthearted, it went a lot better than I anticipated, that went really well in reference to meeting with sister. Patient stated, I'm picking my battles, I have not really had battles in reference to patient's response to stressors/conflict.   Interventions: Cognitive Behavioral Therapy. Clinician conducted session in person at clinician's office at [facility]. Reviewed events since last session and assessed for changes. Reviewed positive events that have occurred since last session. Explored patient's responses to recent stressors. Inquired about patient's practice of turning negative thoughts/situations into positives and the outcome. Discussed contributing factors to decrease in depressive symptoms/depressed mood. Discussed recent meeting with patient's sister and the outcome. Reflected back to patient observations in negative thought patterns, patient's responses, and differences in outcome based on patient's response. Clinician requested for homework patient continue thought record and continue to practice challenging negative thought patterns and practice opposite action.    Collaboration of Care: Other not required at this time   Diagnosis:  Major depressive disorder, recurrent episode, moderate (HCC)   Anxiety disorder, unspecified type     Plan: Patient is to utilize [employer] Therapy, thought re-framing, behavioral activation, relaxation techniques, mindfulness and coping strategies to decrease symptoms associated with their diagnosis. Frequency:  weekly  Modality: individual  Long-term goal:   Reduce overall level, frequency, and intensity of the feelings of depression and anxiety as evidenced by decreased loss of motivation, loss of interest, psychomotor retardation, worry, lack of energy, difficulty concentrating, depressed mood, difficulty falling asleep and returning to sleep, and inactivity from 7 days/week to 0 to 1 days/week per patient report for at least 3 consecutive months. Target Date: 11/25/24  Progress: progressing    Short-term goal:  Increase patient's participation in physical activities, such as, yard work, walking half way around patient's neighborhood, walking nature trail located near patient's home from 1 time per week to 2 times week Target Date: 05/27/24  Progress: progressing    Increase patient's participation in activities/projects patient enjoys from 0 times per week to 3-4 times per week  Target Date: 05/27/24  Progress: progressing    Identify, challenge, and replace negative core beliefs, thought patterns, and negative self talk that contribute to feelings of depression and anxiety with positive thoughts, beliefs, and positive self talk per patient's report Target Date: 05/27/24  Progress: progressing    Continue to make healthy lifestyle changes, such as, healthy dietary choices and increase physical activity Target Date: 05/27/24  Progress: progressing     Darice Seats, LCSW

## 2024-05-25 ENCOUNTER — Ambulatory Visit (INDEPENDENT_AMBULATORY_CARE_PROVIDER_SITE_OTHER): Admitting: Internal Medicine

## 2024-05-25 ENCOUNTER — Encounter (INDEPENDENT_AMBULATORY_CARE_PROVIDER_SITE_OTHER): Payer: Self-pay | Admitting: Internal Medicine

## 2024-05-25 VITALS — BP 120/75 | HR 56 | Temp 97.6°F | Ht 70.0 in | Wt 266.0 lb

## 2024-05-25 DIAGNOSIS — G8929 Other chronic pain: Secondary | ICD-10-CM

## 2024-05-25 DIAGNOSIS — I10 Essential (primary) hypertension: Secondary | ICD-10-CM

## 2024-05-25 DIAGNOSIS — I110 Hypertensive heart disease with heart failure: Secondary | ICD-10-CM | POA: Diagnosis not present

## 2024-05-25 DIAGNOSIS — G4733 Obstructive sleep apnea (adult) (pediatric): Secondary | ICD-10-CM

## 2024-05-25 DIAGNOSIS — Z6838 Body mass index (BMI) 38.0-38.9, adult: Secondary | ICD-10-CM | POA: Diagnosis not present

## 2024-05-25 DIAGNOSIS — E66812 Obesity, class 2: Secondary | ICD-10-CM

## 2024-05-25 DIAGNOSIS — M25562 Pain in left knee: Secondary | ICD-10-CM

## 2024-05-25 DIAGNOSIS — I5032 Chronic diastolic (congestive) heart failure: Secondary | ICD-10-CM | POA: Diagnosis not present

## 2024-05-25 DIAGNOSIS — M25561 Pain in right knee: Secondary | ICD-10-CM | POA: Diagnosis not present

## 2024-05-25 NOTE — Assessment & Plan Note (Signed)
 His blood pressure is well-controlled.  He is currently on amlodipine , metoprolol  and isosorbide .  He is on methylphenidate  which may increase his blood pressure.  He has lost a significant amount of weight which has improved his blood pressure control.  Continue current regimen.

## 2024-05-25 NOTE — Assessment & Plan Note (Signed)
 Weight: decrease of 67.8 lb (20%) over 1 year, 10 months  Start: 04/22/2022 330 lb 12.8 oz (150 kg) (H)  End: 03/18/2024 265 lb (120.2 kg)  He will continue 1500-calorie nutrition plan.  After his wife has knee surgery and he completes construction of his shed he will start going to the gym again.  He has declined antiobesity medications in the past.

## 2024-05-25 NOTE — Assessment & Plan Note (Signed)
 Blood pressure is well-controlled.  He appears to be euvolemic based on bioimpedance and uses a loop diuretic as needed.  Continue current regimen

## 2024-05-25 NOTE — Assessment & Plan Note (Signed)
 On CPAP with reported good compliance. He has lost a total of 20% of total body weight. He uses CPAP consistently and reports inability to sleep without it, indicating ongoing need for CPAP therapy despite significant weight loss. - Continue CPAP therapy.

## 2024-05-25 NOTE — Progress Notes (Signed)
 Office: [phone]  /  Fax: [phone]  Weight Summary And Body Composition Analysis (BIA)  Vitals Temp: 97.6 F (36.4 C) BP: 120/75 Pulse Rate: (!) 56 SpO2: 96 %   Anthropometric Measurements Height: 5' 10 (1.778 m) Weight: 266 lb (120.7 kg) BMI (Calculated): 38.17 Weight at Last Visit: 268 lb Weight Lost Since Last Visit: 2 lb Weight Gained Since Last Visit: 0 lb Starting Weight: 320 lb Total Weight Loss (lbs): 54 lb (24.5 kg) Peak Weight: 382 lb   Body Composition  Body Fat %: 38.7 % Fat Mass (lbs): 103.2 lbs Muscle Mass (lbs): 155.4 lbs Total Body Water  (lbs): 113.2 lbs Visceral Fat Rating : 25    RMR: 1656  Today's Visit #: 25  Starting Date: 07/09/22   Subjective   Chief Complaint: Obesity  [name] is here to discuss his progress with his obesity treatment plan. He is following the Category 3 plan - 1500 kcal per day and states he is following his eating plan approximately 70-80% of the time. He states he is not exercising but has been working most days building his shed.  Weight Progress Since Last Visit:  Since last office visit he has lost 2 pounds. He reports good adherence to reduced calorie nutritional plan. He has been working on reading food labels, not skipping meals, increasing protein intake at every meal, drinking more water , making healthier choices, reducing portion sizes, and incorporating more whole foods   Nutritional 24 HR Recall: Intake consistent with prescribed nutritional plan  Challenges affecting patient progress: orthopedic problems, medical conditions or chronic pain affecting mobility, medical comorbidities, and metabolic adaptations associated with weight loss.   [name]: Denies problems with appetite and hunger signals.  Denies problems with satiety and satiation.  Denies problems with eating patterns and portion [name].  Denies abnormal cravings. Denies feeling deprived or restricted.    Pharmacotherapy for weight management: He is currently taking no anti-obesity medication and patient has declined pharmacotherapy in past.   Assessment and Plan   Treatment Plan For Obesity:  Recommended Dietary Goals  [name] is currently in the action stage of change. As such, his goal is to continue weight management plan. He has agreed to: incorporate 1-2 meal replacements a day for convenience  and continue current plan  Behavioral Health and Counseling  We discussed the following behavioral modification strategies today: continue to work on maintaining a reduced calorie state, getting the recommended amount of protein, incorporating whole foods, making healthy choices, staying well hydrated and practicing mindfulness when eating. and increase protein intake, fibrous foods (25 grams per day for women, 30 grams for men) and water  to improve satiety and decrease hunger signals. .  Additional education and resources provided today: None  Recommended Physical Activity Goals  [name] has been advised to work up to 150 minutes of moderate intensity aerobic activity a week and strengthening exercises 2-3 times per week for cardiovascular health, weight loss maintenance and preservation of muscle mass.   He has agreed to :  Think about enjoyable ways to increase daily physical activity and overcoming barriers to exercise, Increase volume of physical activity to a goal of 240 minutes a week, and Combine aerobic and strengthening exercises for efficiency and improved cardiometabolic health.  Pharmacotherapy and Medical Interventions  Continue with current nutritional and behavioral strategies and Has declined pharmacotherapy  Associated Conditions Impacted by Obesity Treatment  Assessment & Plan Essential hypertension His blood pressure is well-controlled.  He is currently on amlodipine , metoprolol  and isosorbide .  He is on methylphenidate  which may increase his blood pressure.  He has lost  a significant amount of weight which has improved his blood pressure [name].  Continue current regimen. OSA on CPAP On CPAP with reported good compliance. He has lost a total of 20% of total body weight. He uses CPAP consistently and reports inability to sleep without it, indicating ongoing need for CPAP therapy despite significant weight loss. - Continue CPAP therapy.  Chronic heart failure with preserved ejection fraction (HFpEF) (HCC) Blood pressure is well-controlled.  He appears to be euvolemic based on bioimpedance and uses a loop diuretic as needed.  Continue current regimen Class 2 severe obesity with serious comorbidity and body mass index (BMI) of 38.0 to 38.9 in adult, unspecified obesity type Weight: decrease of 67.8 lb (20%) over 1 year, 10 months  Start: 04/22/2022 330 lb 12.8 oz (150 kg) (H)  End: 03/18/2024 265 lb (120.2 kg)  He will continue 1500-calorie nutrition plan.  After his wife has knee surgery and he completes construction of his shed he will start going to the gym again.  He has declined antiobesity medications in the past. Bilateral chronic knee pain He is status post left knee replacement and doing well he wants to continue to lose weight and have his right knee replaced beginning of next year.    Objective   Physical Exam:  Blood pressure 120/75, pulse (!) 56, temperature 97.6 F (36.4 C), height 5' 10 (1.778 m), weight 266 lb (120.7 kg), SpO2 96%. Body mass index is 38.17 kg/m.  General: He is overweight, cooperative, alert, well developed, and in no acute distress. PSYCH: Has normal mood, affect and thought process.   HEENT: EOMI, sclerae are anicteric. Lungs: Normal breathing effort, no conversational dyspnea. Extremities: No edema.  Neurologic: No gross sensory or motor deficits. No tremors or fasciculations noted.    Diagnostic Data Reviewed:  BMET    Component Value Date/Time   NA 142 04/11/2024 1242   NA 143 10/08/2022 1226   K 4.0  04/11/2024 1242   CL 105 04/11/2024 1242   CO2 27 04/11/2024 1242   GLUCOSE 97 04/11/2024 1242   BUN 25 (H) 04/11/2024 1242   BUN 22 10/08/2022 1226   CREATININE 0.93 04/11/2024 1242   CALCIUM  8.9 04/11/2024 1242   GFRNONAA >60 04/11/2024 1242   GFRAA >60 02/10/2020 0937   Lab Results  Component Value Date   HGBA1C 5.3 06/30/2023   HGBA1C 5.4 07/09/2022   Lab Results  Component Value Date   INSULIN  17.5 06/30/2023   INSULIN  35.5 (H) 07/09/2022   Lab Results  Component Value Date   TSH 2.41 03/06/2023   CBC    Component Value Date/Time   WBC 7.7 04/11/2024 1242   RBC 5.24 04/11/2024 1242   HGB 14.0 04/11/2024 1242   HGB 14.5 10/30/2023 0850   HCT 43.9 04/11/2024 1242   HCT 43.9 10/30/2023 0850   PLT 216 04/11/2024 1242   PLT 239 11/23/2021 1024   MCV 83.8 04/11/2024 1242   MCV 87 11/23/2021 1024   MCH 26.7 04/11/2024 1242   MCHC 31.9 04/11/2024 1242   RDW 16.3 (H) 04/11/2024 1242   RDW 13.4 11/23/2021 1024   Iron Studies No results found for: IRON, TIBC, FERRITIN, IRONPCTSAT Lipid Panel     Component Value Date/Time   CHOL 60 03/18/2023 0635   CHOL 82 (L) 07/09/2022 0913   TRIG 56 03/18/2023 0635   HDL 20 (L) 03/18/2023 0635   HDL  28 (L) 07/09/2022 0913   CHOLHDL 3.0 03/18/2023 0635   VLDL 11 03/18/2023 0635   LDLCALC 29 03/18/2023 0635   LDLCALC 33 07/09/2022 0913   Hepatic Function Panel     Component Value Date/Time   PROT 7.5 04/11/2024 1242   PROT 6.7 10/08/2022 1226   ALBUMIN 3.8 04/11/2024 1242   ALBUMIN 4.1 10/08/2022 1226   AST 24 04/11/2024 1242   ALT 34 04/11/2024 1242   ALKPHOS 105 04/11/2024 1242   BILITOT 0.7 04/11/2024 1242   BILITOT 0.3 10/08/2022 1226   BILIDIR 0.1 04/11/2024 1242   BILIDIR <0.10 02/13/2021 0830   IBILI 0.6 04/11/2024 1242      Component Value Date/Time   TSH 2.41 03/06/2023 1552   Nutritional Lab Results  Component Value Date   VD25OH 61.4 10/08/2022   VD25OH 18.2 (L) 07/09/2022     Medications: Outpatient Encounter Medications as of 05/25/2024  Medication Sig   amLODipine  (NORVASC ) 5 MG tablet Take 1 tablet (5 mg total) by mouth daily.   aspirin  81 MG chewable tablet Chew 1 tablet (81 mg total) by mouth 2 (two) times daily.   atorvastatin  (LIPITOR) 40 MG tablet Take 1 tablet (40 mg total) by mouth daily.   buPROPion  (WELLBUTRIN  XL) 300 MG 24 hr tablet Take 1 tablet (300 mg total) by mouth every morning.   Cholecalciferol (VITAMIN D3) 50 MCG (2000 UT) capsule Take 1 capsule (2,000 Units total) by mouth daily.   cyanocobalamin  (VITAMIN B12) 1000 MCG tablet Take 1 tablet (1,000 mcg total) by mouth daily.   DULoxetine  (CYMBALTA ) 60 MG capsule TAKE 1 CAPSULE EVERY       MORNING   furosemide  (LASIX ) 20 MG tablet Take 1 tablet (20mg ) by mouth daily AND take 1 additional tablet (20mg ) by mouth as needed for excess fluid).   gabapentin  (NEURONTIN ) 600 MG tablet Take 2 tablets (1,200 mg total) by mouth at bedtime.   isosorbide  mononitrate (IMDUR ) 60 MG 24 hr tablet Take 1 tablet (60 mg total) by mouth 2 (two) times daily.   levothyroxine  (SYNTHROID ) 88 MCG tablet Take 1 tablet (88 mcg total) by mouth daily before breakfast.   methylphenidate  (RITALIN ) 10 MG tablet Take 1 tablet (10 mg total) by mouth 2 (two) times daily with breakfast and lunch.   metoprolol  succinate (TOPROL -XL) 25 MG 24 hr tablet Take 1 tablet (25 mg total) by mouth daily.   mometasone (NASONEX) 50 MCG/ACT nasal spray Place 2 sprays into the nose daily as needed (allergies).   Multiple Vitamins-Minerals (MULTI FOR HIM 50+) TABS Take 1 tablet by mouth 4 (four) times a week.   NEEDLE, DISP, 18 G (BD DISP NEEDLES) 18G X 1-1/2 MISC use every 14 (fourteen) days for injecting testosterone    NEEDLE, DISP, 21 G (BD DISP NEEDLES) 21G X 1-1/2 MISC use to inject testosterone  every 14 (fourteen) days.   nitroGLYCERIN  (NITROSTAT ) 0.3 MG SL tablet Place 1 tablet (0.3 mg total) under the tongue every 5 (five) minutes as  needed for chest pain. Max of 3 doses within 15 minutes   NON FORMULARY Pt uses c-pap nightly   Syringe, Disposable, (2-3CC SYRINGE) 3 ML MISC Use to inect testosterone  every 14 (fourteen) days.   tamsulosin  (FLOMAX ) 0.4 MG CAPS capsule Take 1 capsule (0.4 mg total) by mouth daily.   testosterone  cypionate (DEPOTESTOSTERONE CYPIONATE) 200 MG/ML injection Inject 1 mL (200 mg total) into the muscle every 14 (fourteen) days.   traZODone  (DESYREL ) 100 MG tablet Take 1-2 tablets (100-200 mg  total) by mouth at bedtime.   No facility-administered encounter medications on file as of 05/25/2024.     Follow-Up   Return in about 4 weeks (around 06/22/2024).SABRA He was informed of the importance of frequent follow up visits to maximize his success with intensive lifestyle modifications for his multiple health conditions.  Attestation Statement   Reviewed by clinician on day of visit: allergies, medications, problem list, medical history, surgical history, family history, social history, and previous encounter notes.     Lucas Parker, MD

## 2024-05-25 NOTE — Progress Notes (Deleted)
 Office: [phone]  /  Fax: [phone]  Weight Summary and Body Composition Analysis (BIA)  Vitals Temp: 97.6 F (36.4 C) BP: 120/75 Pulse Rate: (!) 56 SpO2: 96 %   Anthropometric Measurements Height: 5' 10 (1.778 m) Weight: 266 lb (120.7 kg) BMI (Calculated): 38.17 Weight at Last Visit: 268 lb Weight Lost Since Last Visit: 2 lb Weight Gained Since Last Visit: 0 lb Starting Weight: 320 lb Total Weight Loss (lbs): 54 lb (24.5 kg) Peak Weight: 382 lb   Body Composition  Body Fat %: 38.7 % Fat Mass (lbs): 103.2 lbs Muscle Mass (lbs): 155.4 lbs Total Body Water  (lbs): 113.2 lbs Visceral Fat Rating : 25    RMR: 1656  Today's Visit #: 25  Starting Date: 07/09/22   Subjective   Chief Complaint: Obesity  Interval History Discussed the use of AI scribe software for clinical note transcription with the patient, who gave verbal consent to proceed.  History of Present Illness      Challenges affecting patient progress: {EMOBESITYBARRIERS:28841::none}.    Pharmacotherapy for weight management: He is currently taking {EMPharmaco:28845}.   Assessment and Plan   Treatment Plan For Obesity:  Recommended Dietary Goals  [name] is currently in the action stage of change. As such, his goal is to continue weight management plan. He has agreed to: {EMWTLOSSPLAN:29297::continue current plan}  Behavioral Health and Counseling  We discussed the following behavioral modification strategies today: {EMWMwtlossstrategies:28914::continue to work on maintaining a reduced calorie state, getting the recommended amount of protein, incorporating whole foods, making healthy choices, staying well hydrated and practicing mindfulness when eating.,increase protein intake, fibrous foods (25 grams per day for women, 30 grams for men) and water  to improve satiety and decrease hunger signals. }.  Additional education and resources provided  today: {EMadditionalresources:29169::None}  Recommended Physical Activity Goals  [name] has been advised to work up to 150 minutes of moderate intensity aerobic activity a week and strengthening exercises 2-3 times per week for cardiovascular health, weight loss maintenance and preservation of muscle mass.  He has agreed to :  {EMEXERCISE:28847::Think about enjoyable ways to increase daily physical activity and overcoming barriers to exercise,Increase physical activity in their day and reduce sedentary time (increase NEAT).,Increase volume of physical activity to a goal of 240 minutes a week,Combine aerobic and strengthening exercises for efficiency and improved cardiometabolic health.}  Medical Interventions and Pharmacotherapy  We discussed various medication options to help [name] with his weight loss efforts and we both agreed to : {EMagreedrx:29170}  Associated Conditions Impacted by Obesity Treatment  Assessment & Plan     Assessment and Plan Assessment & Plan   Weight: decrease of 64.8 lb (19.6%) over 2 years, 1 month  Start: 04/22/2022 330 lb 12.8 oz (150 kg) (H)  End: 05/25/2024 266 lb (120.7 kg)   (H): Data is abnormally high     Objective   Physical Exam:  Blood pressure 120/75, pulse (!) 56, temperature 97.6 F (36.4 C), height 5' 10 (1.778 m), weight 266 lb (120.7 kg), SpO2 96%. Body mass index is 38.17 kg/m.  General: He is overweight, cooperative, alert, well developed, and in no acute distress. PSYCH: Has normal mood, affect and thought process.   HEENT: EOMI, sclerae are anicteric. Lungs: Normal breathing effort, no conversational dyspnea. Extremities: No edema.  Neurologic: No gross sensory or motor deficits. No tremors or fasciculations noted.    Diagnostic Data Reviewed:  BMET    Component Value Date/Time   NA 142 04/11/2024 1242   NA 143  10/08/2022 1226   K 4.0 04/11/2024 1242   CL 105 04/11/2024 1242   CO2 27 04/11/2024 1242    GLUCOSE 97 04/11/2024 1242   BUN 25 (H) 04/11/2024 1242   BUN 22 10/08/2022 1226   CREATININE 0.93 04/11/2024 1242   CALCIUM  8.9 04/11/2024 1242   GFRNONAA >60 04/11/2024 1242   GFRAA >60 02/10/2020 0937   Lab Results  Component Value Date   HGBA1C 5.3 06/30/2023   HGBA1C 5.4 07/09/2022   Lab Results  Component Value Date   INSULIN  17.5 06/30/2023   INSULIN  35.5 (H) 07/09/2022   Lab Results  Component Value Date   TSH 2.41 03/06/2023   CBC    Component Value Date/Time   WBC 7.7 04/11/2024 1242   RBC 5.24 04/11/2024 1242   HGB 14.0 04/11/2024 1242   HGB 14.5 10/30/2023 0850   HCT 43.9 04/11/2024 1242   HCT 43.9 10/30/2023 0850   PLT 216 04/11/2024 1242   PLT 239 11/23/2021 1024   MCV 83.8 04/11/2024 1242   MCV 87 11/23/2021 1024   MCH 26.7 04/11/2024 1242   MCHC 31.9 04/11/2024 1242   RDW 16.3 (H) 04/11/2024 1242   RDW 13.4 11/23/2021 1024   Iron Studies No results found for: IRON, TIBC, FERRITIN, IRONPCTSAT Lipid Panel     Component Value Date/Time   CHOL 60 03/18/2023 0635   CHOL 82 (L) 07/09/2022 0913   TRIG 56 03/18/2023 0635   HDL 20 (L) 03/18/2023 0635   HDL 28 (L) 07/09/2022 0913   CHOLHDL 3.0 03/18/2023 0635   VLDL 11 03/18/2023 0635   LDLCALC 29 03/18/2023 0635   LDLCALC 33 07/09/2022 0913   Hepatic Function Panel     Component Value Date/Time   PROT 7.5 04/11/2024 1242   PROT 6.7 10/08/2022 1226   ALBUMIN 3.8 04/11/2024 1242   ALBUMIN 4.1 10/08/2022 1226   AST 24 04/11/2024 1242   ALT 34 04/11/2024 1242   ALKPHOS 105 04/11/2024 1242   BILITOT 0.7 04/11/2024 1242   BILITOT 0.3 10/08/2022 1226   BILIDIR 0.1 04/11/2024 1242   BILIDIR <0.10 02/13/2021 0830   IBILI 0.6 04/11/2024 1242      Component Value Date/Time   TSH 2.41 03/06/2023 1552   Nutritional Lab Results  Component Value Date   VD25OH 61.4 10/08/2022   VD25OH 18.2 (L) 07/09/2022    Medications: Outpatient Encounter Medications as of 05/25/2024  Medication Sig    amLODipine  (NORVASC ) 5 MG tablet Take 1 tablet (5 mg total) by mouth daily.   aspirin  81 MG chewable tablet Chew 1 tablet (81 mg total) by mouth 2 (two) times daily.   atorvastatin  (LIPITOR) 40 MG tablet Take 1 tablet (40 mg total) by mouth daily.   buPROPion  (WELLBUTRIN  XL) 300 MG 24 hr tablet Take 1 tablet (300 mg total) by mouth every morning.   Cholecalciferol (VITAMIN D3) 50 MCG (2000 UT) capsule Take 1 capsule (2,000 Units total) by mouth daily.   cyanocobalamin  (VITAMIN B12) 1000 MCG tablet Take 1 tablet (1,000 mcg total) by mouth daily.   DULoxetine  (CYMBALTA ) 60 MG capsule TAKE 1 CAPSULE EVERY       MORNING   furosemide  (LASIX ) 20 MG tablet Take 1 tablet (20mg ) by mouth daily AND take 1 additional tablet (20mg ) by mouth as needed for excess fluid).   gabapentin  (NEURONTIN ) 600 MG tablet Take 2 tablets (1,200 mg total) by mouth at bedtime.   isosorbide  mononitrate (IMDUR ) 60 MG 24 hr tablet Take 1 tablet (  60 mg total) by mouth 2 (two) times daily.   levothyroxine  (SYNTHROID ) 88 MCG tablet Take 1 tablet (88 mcg total) by mouth daily before breakfast.   methylphenidate  (RITALIN ) 10 MG tablet Take 1 tablet (10 mg total) by mouth 2 (two) times daily with breakfast and lunch.   metoprolol  succinate (TOPROL -XL) 25 MG 24 hr tablet Take 1 tablet (25 mg total) by mouth daily.   mometasone (NASONEX) 50 MCG/ACT nasal spray Place 2 sprays into the nose daily as needed (allergies).   Multiple Vitamins-Minerals (MULTI FOR HIM 50+) TABS Take 1 tablet by mouth 4 (four) times a week.   NEEDLE, DISP, 18 G (BD DISP NEEDLES) 18G X 1-1/2 MISC use every 14 (fourteen) days for injecting testosterone    NEEDLE, DISP, 21 G (BD DISP NEEDLES) 21G X 1-1/2 MISC use to inject testosterone  every 14 (fourteen) days.   nitroGLYCERIN  (NITROSTAT ) 0.3 MG SL tablet Place 1 tablet (0.3 mg total) under the tongue every 5 (five) minutes as needed for chest pain. Max of 3 doses within 15 minutes   NON FORMULARY Pt uses c-pap  nightly   Syringe, Disposable, (2-3CC SYRINGE) 3 ML MISC Use to inect testosterone  every 14 (fourteen) days.   tamsulosin  (FLOMAX ) 0.4 MG CAPS capsule Take 1 capsule (0.4 mg total) by mouth daily.   testosterone  cypionate (DEPOTESTOSTERONE CYPIONATE) 200 MG/ML injection Inject 1 mL (200 mg total) into the muscle every 14 (fourteen) days.   traZODone  (DESYREL ) 100 MG tablet Take 1-2 tablets (100-200 mg total) by mouth at bedtime.   No facility-administered encounter medications on file as of 05/25/2024.     Follow-Up   No follow-ups on file.SABRA He was informed of the importance of frequent follow up visits to maximize his success with intensive lifestyle modifications for his multiple health conditions.  Attestation Statement   Reviewed by clinician on day of visit: allergies, medications, problem list, medical history, surgical history, family history, social history, and previous encounter notes.     Kevin Parker, MD

## 2024-05-25 NOTE — Assessment & Plan Note (Signed)
 He is status post left knee replacement and doing well he wants to continue to lose weight and have his right knee replaced beginning of next year.

## 2024-05-26 ENCOUNTER — Ambulatory Visit: Admitting: Clinical

## 2024-05-31 ENCOUNTER — Ambulatory Visit: Admitting: Orthopaedic Surgery

## 2024-06-02 ENCOUNTER — Ambulatory Visit (INDEPENDENT_AMBULATORY_CARE_PROVIDER_SITE_OTHER)

## 2024-06-02 ENCOUNTER — Encounter: Payer: Self-pay | Admitting: Internal Medicine

## 2024-06-02 ENCOUNTER — Ambulatory Visit: Attending: Internal Medicine | Admitting: Internal Medicine

## 2024-06-02 VITALS — BP 110/65 | HR 73 | Ht 69.0 in | Wt 274.8 lb

## 2024-06-02 DIAGNOSIS — I10 Essential (primary) hypertension: Secondary | ICD-10-CM

## 2024-06-02 DIAGNOSIS — I25118 Atherosclerotic heart disease of native coronary artery with other forms of angina pectoris: Secondary | ICD-10-CM | POA: Diagnosis not present

## 2024-06-02 DIAGNOSIS — I5032 Chronic diastolic (congestive) heart failure: Secondary | ICD-10-CM

## 2024-06-02 DIAGNOSIS — E785 Hyperlipidemia, unspecified: Secondary | ICD-10-CM

## 2024-06-02 DIAGNOSIS — R002 Palpitations: Secondary | ICD-10-CM

## 2024-06-02 NOTE — Progress Notes (Unsigned)
 Cardiology Office Note:  .   Date:  06/02/2024  ID:  [name], DOB 04/12/1956, MRN [id number] PCP: Kevin Charlie FERNS, MD  [facility] Providers Cardiologist:  Lonni Hanson, MD { Click to update primary MD,subspecialty MD or APP then REFRESH:1}    History of Present Illness: .   [name] is a 68 y.o. male with history of coronary artery disease, chronic HFpEF, borderline dilated ascending aorta measuring 4.0 cm (indexed by echo in 02/2023, hypertension, hyperlipidemia, and obstructive sleep apnea, who presents for follow-up of coronary artery disease and HFpEF.  Prior catheterization in 02/2023 showed CTO of small D2 branch not amenable to PCI and otherwise nonobstructive CAD.  I last saw him in 08/2023, at which time he noted intermittent pain across his lower chest and upper abdomen that had been present for at least 6 months.  It had not changed with escalation of isosorbide  mononitrate and was typically only present when sitting (not with exertion).  He was anticipating staged bilateral knee replacements with Dr. Vernetta; it was felt that he could proceed without additional cardiac testing/intervention for these low risk procedures.  No CP in about a month.  Last CP occurred when getting out of bed.  Already had left TKR, planning for right in January.  No further chest/abd pain with sitting.  Stable mild shortness of breath; attributes to nasal problems.  Occ palpitations, lasting 45-60 secs with associated LH.  Bending over can bring it on especially bendopnea.  Weight 330 in 03/2022 -> 266 at weight management.  [facility].  Took NTG in September during spell going to bathroom; had to take 2 pills.    ROS: See HPI  Studies Reviewed: SABRA   EKG Interpretation Date/Time:  Wednesday June 02 2024 14:21:41 EDT Ventricular Rate:  73 PR Interval:  184 QRS Duration:  92 QT Interval:  390 QTC Calculation: 429 R Axis:   -13  Text  Interpretation: Normal sinus rhythm Minimal voltage criteria for LVH, may be normal variant ( R in aVL ) Cannot rule out Anterior infarct (cited on or before 17-Sep-2023) Abnormal ECG When compared with ECG of 17-Sep-2023 15:47, HEART RATE has increased Otherwise no significant change Confirmed by Charde Macfarlane, Lonni [phone]) on 06/02/2024 2:25:07 PM    LHC (03/18/2023): Severe single-vessel coronary artery disease with chronic total occlusion of small D2 branch, which has progressed since last catheterization in 11/2021 (stenosis was approximately 50% at that time).  Otherwise, stable appearance of mild-moderate, nonobstructive CAD. Normal left ventricular filling pressure.   TTE (03/18/2023):  1. Left ventricular ejection fraction, by estimation, is 55 to 60%. The  left ventricle has normal function. Left ventricular endocardial border  not optimally defined to evaluate regional wall motion. There is moderate left ventricular hypertrophy. Left  ventricular diastolic parameters are consistent with Grade I diastolic  dysfunction (impaired relaxation).   2. Right ventricular systolic function is normal. The right ventricular  size is normal. Tricuspid regurgitation signal is inadequate for assessing  PA pressure.   3. Right atrial size was mildly dilated.   4. The mitral valve is normal in structure. No evidence of mitral valve  regurgitation. No evidence of mitral stenosis.   5. The aortic valve is normal in structure. Aortic valve regurgitation is  not visualized. No aortic stenosis is present.   6. Aortic dilatation noted. There is mild dilatation of the ascending  aorta, measuring 40 mm.   Risk Assessment/Calculations:   {Does this patient  have ATRIAL FIBRILLATION?[phone]}         Physical Exam:   VS:  BP 110/65 (BP Location: Left Arm, Patient Position: Sitting)   Pulse 73   Ht 5' 9 (1.753 m)   Wt 274 lb 12.8 oz (124.6 kg)   SpO2 97%   BMI 40.58 kg/m    Wt Readings from Last 3  Encounters:  06/02/24 274 lb 12.8 oz (124.6 kg)  05/25/24 266 lb (120.7 kg)  04/27/24 268 lb (121.6 kg)    General:  NAD. Neck: No JVD or HJR. Lungs: Clear to auscultation bilaterally without wheezes or crackles. Heart: Regular rate and rhythm without murmurs, rubs, or gallops. Abdomen: Soft, nontender, nondistended. Extremities: Trace pretibial edema.  ASSESSMENT AND PLAN: .    ***    {Are you ordering a CV Procedure (e.g. stress test, cath, DCCV, TEE, etc)?   Press F2        :[id number]}  Dispo: ***  Signed, Lonni Hanson, MD

## 2024-06-02 NOTE — Patient Instructions (Signed)
 Medication Instructions:  Your physician recommends that you continue on your current medications as directed. Please refer to the Current Medication list given to you today.    *If you need a refill on your cardiac medications before your next appointment, please call your pharmacy*  Lab Work: Your provider would like for you to have following labs drawn today Lipid.     Testing/Procedures: GEOFFRY HEWS- Long Term Monitor Instructions  Your physician has requested you wear a ZIO patch monitor for 14 days.  This is a single patch monitor. [employer] supplies one patch monitor per enrollment. Additional stickers are not available. Please do not apply patch if you will be having a Nuclear Stress Test, Echocardiogram, Cardiac CT, MRI, or Chest Xray during the period you would be wearing the monitor. The patch cannot be worn during these tests. You cannot remove and re-apply the ZIO XT patch monitor.  Your ZIO patch monitor will be mailed 3 day USPS to your address on file. It may take 3-5 days to receive your monitor after you have been enrolled. Once you have received your monitor, please review the enclosed instructions. Your monitor has already been registered assigning a specific monitor serial number to you.  Billing and Patient Assistance Program Information  We have supplied [employer] with any of your insurance information on file for billing purposes.  [employer] offers a sliding scale Patient Assistance Program for patients that do not have insurance, or whose insurance does not completely cover the cost of the ZIO monitor.  You must apply for the Patient Assistance Program to qualify for this discounted rate.  To apply, please call [employer] at [phone], select option 4, select option 2, ask to apply for Patient Assistance Program. Meredeth will ask your household income, and how many people are in your household. They will quote your out-of-pocket cost based on that information. [employer] will also be  able to set up a 24-month, interest-free payment plan if needed.  Applying the monitor   Shave hair from upper left chest.  Hold abrader disc by orange tab. Rub abrader in 40 strokes over the upper left chest as indicated in your monitor instructions.  Clean area with 4 enclosed alcohol pads. Let dry.  Apply patch as indicated in monitor instructions. Patch will be placed under collarbone on left side of chest with arrow pointing upward.  Rub patch adhesive wings for 2 minutes. Remove white label marked 1. Remove the white label marked 2. Rub patch adhesive wings for 2 additional minutes.  While looking in a mirror, press and release button in center of patch. A small green light will flash 3-4 times. This will be your only indicator that the monitor has been turned on.  Do not shower for the first 24 hours. You may shower after the first 24 hours.  Press the button if you feel a symptom. You will hear a small click. Record Date, Time and Symptom in the Patient Logbook.  When you are ready to remove the patch, follow instructions on the last 2 pages of Patient Logbook.  Stick patch monitor into the tabs at the bottom of the return box.  Place Patient Logbook in the blue and white box. Use locking tab on box and tape box closed securely. The blue and white box has prepaid postage on it. Please place it in the mailbox as soon as possible. Your physician should have your test results approximately 7-14 days after the monitor has been mailed back to [facility].  Call [facility] Customer Care at [phone] if you have questions regarding your ZIO XT patch monitor.  Call them immediately if you see an orange light blinking on your monitor.  If your monitor falls off in less than 4 days, contact our Monitor department at [phone].  If your monitor becomes loose or falls off after 4 days call [employer] at [phone] for suggestions on securing your monitor.   Follow-Up: At [facility], you and your health needs are our priority.  As part of our continuing mission to provide you with exceptional heart care, our providers are all part of one team.  This team includes your primary Cardiologist (physician) and Advanced Practice Providers or APPs (Physician Assistants and Nurse Practitioners) who all work together to provide you with the care you need, when you need it.  Your next appointment:   6 month(s)  Provider:   You may see Lonni Hanson, MD or one of the following Advanced Practice Providers on your designated Care Team:   Lonni Meager, NP Lesley Maffucci, PA-C Bernardino Bring, PA-C Cadence Marienthal, PA-C Tylene Lunch, NP Barnie Hila, NP

## 2024-06-03 ENCOUNTER — Ambulatory Visit: Payer: Self-pay | Admitting: Internal Medicine

## 2024-06-03 ENCOUNTER — Other Ambulatory Visit: Payer: Self-pay

## 2024-06-03 DIAGNOSIS — Z79899 Other long term (current) drug therapy: Secondary | ICD-10-CM

## 2024-06-03 LAB — LIPID PANEL
Chol/HDL Ratio: 2.7 ratio (ref 0.0–5.0)
Cholesterol, Total: 57 mg/dL — ABNORMAL LOW (ref 100–199)
HDL: 21 mg/dL — ABNORMAL LOW
LDL Chol Calc (NIH): 20 mg/dL (ref 0–99)
Triglycerides: 74 mg/dL (ref 0–149)
VLDL Cholesterol Cal: 16 mg/dL (ref 5–40)

## 2024-06-03 MED ORDER — ATORVASTATIN CALCIUM 20 MG PO TABS
20.0000 mg | ORAL_TABLET | Freq: Every day | ORAL | 3 refills | Status: AC
  Filled 2024-08-30: qty 90, 90d supply, fill #0

## 2024-06-04 ENCOUNTER — Encounter: Payer: Self-pay | Admitting: Internal Medicine

## 2024-06-04 DIAGNOSIS — R002 Palpitations: Secondary | ICD-10-CM | POA: Insufficient documentation

## 2024-06-08 ENCOUNTER — Other Ambulatory Visit: Payer: Self-pay | Admitting: Urology

## 2024-06-08 ENCOUNTER — Other Ambulatory Visit (HOSPITAL_COMMUNITY): Payer: Self-pay

## 2024-06-08 ENCOUNTER — Other Ambulatory Visit: Payer: Self-pay

## 2024-06-08 DIAGNOSIS — E291 Testicular hypofunction: Secondary | ICD-10-CM

## 2024-06-08 MED ORDER — BD ECLIPSE NEEDLE 21G X 1-1/2" MISC
0 refills | Status: AC
  Filled 2024-06-08: qty 2, 28d supply, fill #0
  Filled 2024-07-19: qty 2, 28d supply, fill #1
  Filled 2024-08-16: qty 2, 28d supply, fill #2
  Filled 2024-09-20 – 2024-09-29 (×2): qty 2, 28d supply, fill #3

## 2024-06-08 MED ORDER — TESTOSTERONE CYPIONATE 200 MG/ML IM SOLN
200.0000 mg | INTRAMUSCULAR | 0 refills | Status: AC
  Filled 2024-06-08: qty 10, 140d supply, fill #0
  Filled 2024-09-14: qty 6, 84d supply, fill #0

## 2024-06-08 MED ORDER — NEEDLE (DISP) 18G X 1-1/2" MISC
0 refills | Status: AC
  Filled 2024-06-08: qty 2, 28d supply, fill #0
  Filled 2024-07-19: qty 2, 28d supply, fill #1
  Filled 2024-08-16: qty 2, 28d supply, fill #2
  Filled 2024-09-20 – 2024-09-29 (×2): qty 2, 28d supply, fill #3

## 2024-06-08 MED FILL — Methylphenidate HCl Tab 10 MG: ORAL | 30 days supply | Qty: 60 | Fill #0 | Status: AC

## 2024-06-09 ENCOUNTER — Other Ambulatory Visit (HOSPITAL_COMMUNITY): Payer: Self-pay

## 2024-06-09 ENCOUNTER — Other Ambulatory Visit: Payer: Self-pay

## 2024-06-16 ENCOUNTER — Other Ambulatory Visit (HOSPITAL_BASED_OUTPATIENT_CLINIC_OR_DEPARTMENT_OTHER): Payer: Self-pay

## 2024-06-18 ENCOUNTER — Other Ambulatory Visit (HOSPITAL_COMMUNITY): Payer: Self-pay

## 2024-06-18 ENCOUNTER — Other Ambulatory Visit: Payer: Self-pay | Admitting: Internal Medicine

## 2024-06-21 ENCOUNTER — Other Ambulatory Visit: Payer: Self-pay

## 2024-06-21 ENCOUNTER — Other Ambulatory Visit (HOSPITAL_COMMUNITY): Payer: Self-pay

## 2024-06-21 MED ORDER — DULOXETINE HCL 60 MG PO CPEP
60.0000 mg | ORAL_CAPSULE | Freq: Every morning | ORAL | 3 refills | Status: DC
  Filled 2024-06-21: qty 90, 90d supply, fill #0

## 2024-06-22 ENCOUNTER — Ambulatory Visit (INDEPENDENT_AMBULATORY_CARE_PROVIDER_SITE_OTHER): Admitting: Clinical

## 2024-06-22 ENCOUNTER — Ambulatory Visit (INDEPENDENT_AMBULATORY_CARE_PROVIDER_SITE_OTHER): Admitting: Internal Medicine

## 2024-06-22 ENCOUNTER — Encounter (INDEPENDENT_AMBULATORY_CARE_PROVIDER_SITE_OTHER): Payer: Self-pay | Admitting: Internal Medicine

## 2024-06-22 VITALS — BP 102/68 | HR 64 | Temp 97.7°F | Ht 69.0 in | Wt 270.0 lb

## 2024-06-22 DIAGNOSIS — Z6838 Body mass index (BMI) 38.0-38.9, adult: Secondary | ICD-10-CM

## 2024-06-22 DIAGNOSIS — I25118 Atherosclerotic heart disease of native coronary artery with other forms of angina pectoris: Secondary | ICD-10-CM | POA: Diagnosis not present

## 2024-06-22 DIAGNOSIS — F419 Anxiety disorder, unspecified: Secondary | ICD-10-CM | POA: Diagnosis not present

## 2024-06-22 DIAGNOSIS — I10 Essential (primary) hypertension: Secondary | ICD-10-CM

## 2024-06-22 DIAGNOSIS — E66812 Obesity, class 2: Secondary | ICD-10-CM

## 2024-06-22 DIAGNOSIS — M1711 Unilateral primary osteoarthritis, right knee: Secondary | ICD-10-CM | POA: Diagnosis not present

## 2024-06-22 DIAGNOSIS — F331 Major depressive disorder, recurrent, moderate: Secondary | ICD-10-CM

## 2024-06-22 NOTE — Assessment & Plan Note (Signed)
 Patient is status post left knee replacement and has recovered he also has advanced osteoarthritis of the right knee affecting his mobility and quality of life he continues to work on his weight loss to remain a candidate for surgery.  We discussed the role and benefits of GLP-1 treatment which may also help with his osteoarthritis.  Patient will be considering treatment

## 2024-06-22 NOTE — Assessment & Plan Note (Signed)
 His blood pressure is well-controlled.  He is currently on amlodipine , metoprolol  and isosorbide .  He is on methylphenidate  which may increase his blood pressure.  He has lost a significant amount of weight which has improved his blood pressure control.  Continue current regimen.

## 2024-06-22 NOTE — Assessment & Plan Note (Signed)
 Asymptomatic.  He is on antiplatelet, metoprolol  and atorvastatin , which are effectively managing blood pressure and cardiovascular health. - Continue metoprolol  and atorvastatin  as prescribed. -Patient would benefit from GLP-1 aided weight loss with semaglutide for cardiovascular risk reduction.

## 2024-06-22 NOTE — Assessment & Plan Note (Signed)
 Weight: decrease of 60.8 lb (18.4%) over 2 years, 2 months  Start: 04/22/2022 330 lb 12.8 oz (150 kg) (H)  End: 06/22/2024 270 lb (122.5 kg)  Obesity with weight management challenges Obesity with a BMI of 45. Weight increased from 266 lbs to 270 lbs. Previous weight loss from 330 lbs to 270 lbs over two years. Current challenges include physical activity limitations due to knee issues and dietary habits. - Implement intermittent fasting with an 8-hour eating window. - Incorporate a meal replacement (protein shake) for breakfast. - Increase physical activity by attending [employer] and participating in the PREP program if available. - Discuss and consider starting GLP-1 receptor agonist [facility]) for weight management, addressing cardiovascular risk reduction and comorbid conditions.

## 2024-06-22 NOTE — Progress Notes (Signed)
 Office: [phone]  /  Fax: [phone]  Weight Summary and Body Composition Analysis (BIA)  Vitals Temp: 97.7 F (36.5 C) BP: 102/68 Pulse Rate: 64 SpO2: 98 %   Anthropometric Measurements Height: 5' 9 (1.753 m) Weight: 270 lb (122.5 kg) BMI (Calculated): 39.85 Weight at Last Visit: 266 lb Weight Lost Since Last Visit: 0 lb Weight Gained Since Last Visit: 4 lb Starting Weight: 320 lb Total Weight Loss (lbs): 50 lb (22.7 kg) Peak Weight: 382 lb   Body Composition  Body Fat %: 40 % Fat Mass (lbs): 108.4 lbs Muscle Mass (lbs): 154.4 lbs Total Body Water  (lbs): 120.2 lbs Visceral Fat Rating : 26    RMR: 1656  Today's Visit #: 26  Starting Date: 07/09/22   Subjective   Chief Complaint: Obesity  Interval History Discussed the use of AI scribe software for clinical note transcription with the patient, who gave verbal consent to proceed.  History of Present Illness [name] is a 68 year old male with obesity who presents for weight management consultation. He is accompanied by his wife, [name].  Since last office visit he has gained 4 pounds he reports wearing a 1500-calorie nutrition target 75% of the time.  He has not been tracking feels like he is getting the recommended amount of protein reports adequate sleep is exercising 2 days a week doing yard work.  He experiences knee pain exacerbated by physical activities such as climbing ladders, leading to significant swelling and difficulty walking for two days. Recently, his knee 'blew up' after climbing a ladder to work on boards, which he described as causing severe discomfort and swelling. He is concerned that if he does not lose weight, he may not be eligible for knee surgery scheduled for the first of the year.  He has a history of weight fluctuations, with a previous low weight of 265 pounds in July, but has since regained weight, currently at 270 pounds. He is frustrated with the weight gain and wants  to return to the gym and increase physical activity.      Challenges affecting patient progress: orthopedic problems, medical conditions or chronic pain affecting mobility, medical comorbidities, and metabolic adaptations associated with weight loss.    Pharmacotherapy for weight management: He is currently taking no anti-obesity medication and patient has declined pharmacotherapy in past.   Assessment and Plan   Treatment Plan For Obesity:  Recommended Dietary Goals  [name] is currently in the action stage of change. As such, his goal is to continue weight management plan. He has agreed to: incorporate 1-2 meal replacements a day for convenience , continue current plan, and incorporate time restricted eating 16 hours fasting with an 8 hour eating window while maintaining reduced calorie nutrition plan  Behavioral Health and Counseling  We discussed the following behavioral modification strategies today: continue to work on maintaining a reduced calorie state, getting the recommended amount of protein, incorporating whole foods, making healthy choices, staying well hydrated and practicing mindfulness when eating. and increase protein intake, fibrous foods (25 grams per day for women, 30 grams for men) and water  to improve satiety and decrease hunger signals. .  Additional education and resources provided today: None  Recommended Physical Activity Goals  [name] has been advised to work up to 150 minutes of moderate intensity aerobic activity a week and strengthening exercises 2-3 times per week for cardiovascular health, weight loss maintenance and preservation of muscle mass.  He has agreed to :  Think about enjoyable  ways to increase daily physical activity and overcoming barriers to exercise, Increase physical activity in their day and reduce sedentary time (increase NEAT)., Increase volume of physical activity to a goal of 240 minutes a week, and Combine aerobic and strengthening  exercises for efficiency and improved cardiometabolic health.  Medical Interventions and Pharmacotherapy  We discussed various medication options to help [name] with his weight loss efforts and we both agreed to : Continue with current nutritional and behavioral strategies, Was educated on GLP-1 receptor agonists, their role in managing obesity-related conditions and commonly associated side effects. , and was given a handout on GLP-1's  Associated Conditions Impacted by Obesity Treatment  Assessment & Plan Coronary artery disease of native artery of native heart with stable angina pectoris Asymptomatic.  He is on antiplatelet, metoprolol  and atorvastatin , which are effectively managing blood pressure and cardiovascular health. - Continue metoprolol  and atorvastatin  as prescribed. -Patient would benefit from GLP-1 aided weight loss with semaglutide for cardiovascular risk reduction. Essential hypertension His blood pressure is well-controlled.  He is currently on amlodipine , metoprolol  and isosorbide .  He is on methylphenidate  which may increase his blood pressure.  He has lost a significant amount of weight which has improved his blood pressure control.  Continue current regimen. Class 2 severe obesity with serious comorbidity and body mass index (BMI) of 38.0 to 38.9 in adult, unspecified obesity type Weight: decrease of 60.8 lb (18.4%) over 2 years, 2 months  Start: 04/22/2022 330 lb 12.8 oz (150 kg) (H)  End: 06/22/2024 270 lb (122.5 kg)  Obesity with weight management challenges Obesity with a BMI of 45. Weight increased from 266 lbs to 270 lbs. Previous weight loss from 330 lbs to 270 lbs over two years. Current challenges include physical activity limitations due to knee issues and dietary habits. - Implement intermittent fasting with an 8-hour eating window. - Incorporate a meal replacement (protein shake) for breakfast. - Increase physical activity by attending [employer] and participating in  the PREP program if available. - Discuss and consider starting GLP-1 receptor agonist [facility]) for weight management, addressing cardiovascular risk reduction and comorbid conditions. Unilateral primary osteoarthritis, right knee Patient is status post left knee replacement and has recovered he also has advanced osteoarthritis of the right knee affecting his mobility and quality of life he continues to work on his weight loss to remain a candidate for surgery.  We discussed the role and benefits of GLP-1 treatment which may also help with his osteoarthritis.  Patient will be considering treatment           Objective   Physical Exam:  Blood pressure 102/68, pulse 64, temperature 97.7 F (36.5 C), height 5' 9 (1.753 m), weight 270 lb (122.5 kg), SpO2 98%. Body mass index is 39.87 kg/m.  General: He is overweight, cooperative, alert, well developed, and in no acute distress. PSYCH: Has normal mood, affect and thought process.   HEENT: EOMI, sclerae are anicteric. Lungs: Normal breathing effort, no conversational dyspnea. Extremities: No edema.  Neurologic: No gross sensory or motor deficits. No tremors or fasciculations noted.    Diagnostic Data Reviewed:  BMET    Component Value Date/Time   NA 142 04/11/2024 1242   NA 143 10/08/2022 1226   K 4.0 04/11/2024 1242   CL 105 04/11/2024 1242   CO2 27 04/11/2024 1242   GLUCOSE 97 04/11/2024 1242   BUN 25 (H) 04/11/2024 1242   BUN 22 10/08/2022 1226   CREATININE 0.93 04/11/2024 1242   CALCIUM  8.9  04/11/2024 1242   GFRNONAA >60 04/11/2024 1242   GFRAA >60 02/10/2020 0937   Lab Results  Component Value Date   HGBA1C 5.3 06/30/2023   HGBA1C 5.4 07/09/2022   Lab Results  Component Value Date   INSULIN  17.5 06/30/2023   INSULIN  35.5 (H) 07/09/2022   Lab Results  Component Value Date   TSH 2.41 03/06/2023   CBC    Component Value Date/Time   WBC 7.7 04/11/2024 1242   RBC 5.24 04/11/2024 1242   HGB 14.0 04/11/2024  1242   HGB 14.5 10/30/2023 0850   HCT 43.9 04/11/2024 1242   HCT 43.9 10/30/2023 0850   PLT 216 04/11/2024 1242   PLT 239 11/23/2021 1024   MCV 83.8 04/11/2024 1242   MCV 87 11/23/2021 1024   MCH 26.7 04/11/2024 1242   MCHC 31.9 04/11/2024 1242   RDW 16.3 (H) 04/11/2024 1242   RDW 13.4 11/23/2021 1024   Iron Studies No results found for: IRON, TIBC, FERRITIN, IRONPCTSAT Lipid Panel     Component Value Date/Time   CHOL 57 (L) 06/02/2024 1453   TRIG 74 06/02/2024 1453   HDL 21 (L) 06/02/2024 1453   CHOLHDL 2.7 06/02/2024 1453   CHOLHDL 3.0 03/18/2023 0635   VLDL 11 03/18/2023 0635   LDLCALC 20 06/02/2024 1453   Hepatic Function Panel     Component Value Date/Time   PROT 7.5 04/11/2024 1242   PROT 6.7 10/08/2022 1226   ALBUMIN 3.8 04/11/2024 1242   ALBUMIN 4.1 10/08/2022 1226   AST 24 04/11/2024 1242   ALT 34 04/11/2024 1242   ALKPHOS 105 04/11/2024 1242   BILITOT 0.7 04/11/2024 1242   BILITOT 0.3 10/08/2022 1226   BILIDIR 0.1 04/11/2024 1242   BILIDIR <0.10 02/13/2021 0830   IBILI 0.6 04/11/2024 1242      Component Value Date/Time   TSH 2.41 03/06/2023 1552   Nutritional Lab Results  Component Value Date   VD25OH 61.4 10/08/2022   VD25OH 18.2 (L) 07/09/2022    Medications: Outpatient Encounter Medications as of 06/22/2024  Medication Sig   amLODipine  (NORVASC ) 5 MG tablet Take 1 tablet (5 mg total) by mouth daily.   aspirin  81 MG chewable tablet Chew 1 tablet (81 mg total) by mouth 2 (two) times daily.   atorvastatin  (LIPITOR) 20 MG tablet Take 1 tablet (20 mg total) by mouth daily.   buPROPion  (WELLBUTRIN  XL) 300 MG 24 hr tablet Take 1 tablet (300 mg total) by mouth every morning.   Cholecalciferol (VITAMIN D3) 50 MCG (2000 UT) capsule Take 1 capsule (2,000 Units total) by mouth daily.   cyanocobalamin  (VITAMIN B12) 1000 MCG tablet Take 1 tablet (1,000 mcg total) by mouth daily.   DULoxetine  (CYMBALTA ) 60 MG capsule TAKE 1 CAPSULE EVERY        MORNING   furosemide  (LASIX ) 20 MG tablet Take 1 tablet (20mg ) by mouth daily AND take 1 additional tablet (20mg ) by mouth as needed for excess fluid).   gabapentin  (NEURONTIN ) 600 MG tablet Take 2 tablets (1,200 mg total) by mouth at bedtime.   isosorbide  mononitrate (IMDUR ) 60 MG 24 hr tablet Take 1 tablet (60 mg total) by mouth 2 (two) times daily.   levothyroxine  (SYNTHROID ) 88 MCG tablet Take 1 tablet (88 mcg total) by mouth daily before breakfast.   methylphenidate  (RITALIN ) 10 MG tablet Take 1 tablet (10 mg total) by mouth 2 (two) times daily with breakfast and lunch.   metoprolol  succinate (TOPROL -XL) 25 MG 24 hr tablet Take 1  tablet (25 mg total) by mouth daily.   mometasone (NASONEX) 50 MCG/ACT nasal spray Place 2 sprays into the nose daily as needed (allergies).   Multiple Vitamins-Minerals (MULTI FOR HIM 50+) TABS Take 1 tablet by mouth 4 (four) times a week.   NEEDLE, DISP, 18 G (BD DISP NEEDLES) 18G X 1-1/2 MISC use every 14 (fourteen) days for injecting testosterone    NEEDLE, DISP, 18 G 18G X 1-1/2 MISC Use 1 needle every 14 days.   NEEDLE, DISP, 21 G (BD DISP NEEDLES) 21G X 1-1/2 MISC use to inject testosterone  every 14 (fourteen) days.   NEEDLE, DISP, 21 G (BD ECLIPSE NEEDLE) 21G X 1-1/2 MISC Use 1 needle every 14 day.s   nitroGLYCERIN  (NITROSTAT ) 0.3 MG SL tablet Place 1 tablet (0.3 mg total) under the tongue every 5 (five) minutes as needed for chest pain. Max of 3 doses within 15 minutes   NON FORMULARY Pt uses c-pap nightly   Syringe, Disposable, (2-3CC SYRINGE) 3 ML MISC Use to inect testosterone  every 14 (fourteen) days.   tamsulosin  (FLOMAX ) 0.4 MG CAPS capsule Take 1 capsule (0.4 mg total) by mouth daily.   testosterone  cypionate (DEPOTESTOSTERONE CYPIONATE) 200 MG/ML injection Inject 1 mL (200 mg total) into the muscle every 14 (fourteen) days.   traZODone  (DESYREL ) 100 MG tablet Take 1-2 tablets (100-200 mg total) by mouth at bedtime.   [DISCONTINUED] DULoxetine   (CYMBALTA ) 60 MG capsule TAKE 1 CAPSULE EVERY       MORNING   No facility-administered encounter medications on file as of 06/22/2024.     Follow-Up   Return in about 4 weeks (around 07/20/2024) for For Weight Mangement with Dr. Francyne.SABRA He was informed of the importance of frequent follow up visits to maximize his success with intensive lifestyle modifications for his multiple health conditions.  Attestation Statement   Reviewed by clinician on day of visit: allergies, medications, problem list, medical history, surgical history, family history, social history, and previous encounter notes.     Lucas Francyne, MD

## 2024-06-22 NOTE — Progress Notes (Unsigned)
   Darice Seats, LCSW

## 2024-06-22 NOTE — Progress Notes (Unsigned)
 [facility] Counselor/Therapist Progress Note  Patient ID: [name], MRN: [id number],    Date: 06/22/2024  Time Spent: 10:33am - 11:41am : 68 minutes   Treatment Type: Individual Therapy  Reported Symptoms: lack of energy, motivation. difficulty staying asleep, difficulty focusing, loss of interest  Mental Status Exam: Appearance:  Casual     Behavior: Appropriate  Motor: Normal  Speech/Language:  Clear and Coherent and Normal Rate  Affect: Appropriate  Mood: Patient reported bad in response to current mood  Thought process: normal  Thought content:   WNL  Sensory/Perceptual disturbances:   WNL  Orientation: oriented to person, place, time/date, and situation  Attention: Good  Concentration: Good  Memory: WNL  Fund of knowledge:  Good  Insight:   Good  Judgment:  Good  Impulse Control: Fair   Risk Assessment: Danger to Self:  No Patient denied current suicidal ideation  Self-injurious Behavior: No Danger to Others: No Patient denied current homicidal ideation Duty to Warn:no Physical Aggression / Violence:No  Access to Firearms a concern: No  Gang Involvement:No   Subjective:  Patient stated, not good in response to events since last session. Patient reported patient ran out of duloxetine  over two weeks ago and reported the pharmacy is trying to coordinate with patient's provider to refill the medication. Patient reported feeling bad, physically bad, and reported lack of energy and enthusiasm since lapse in medication occurred. Patient reported feeling upset that medications were sent to [facility] pharmacy instead of patient's current pharmacy. Patient reported bad mood, difficulty focusing, and loss of interest since lapse in medication occurred. Patient reported patient's friend was killed in a combine accident one week ago and patient stated, it affected me worse than I thought. Patient stated, I'm getting ready to wire that building and I  can't call [name] (friend) and that thought is just live able but it's painful. Patient stated, the problem is they both were unexpected in reference to friends' deaths. Patient stated, he was a friend, I respected him, he was good man, it just hurts in reference to recent loss. Patient reported the need to discuss patient's use of pornography. Patient reported medical concerns related to sexual performance. Patient reported patient does not want to disclose recent behaviors and medical concerns to wife. Patient reported concern wife will feel guilty if patient discloses medical concerns. Patient reported a previous addiction to pornography and reported a previous addiction to alcohol. Patient stated, at first I thought it was because I needed to, now I think it's because my wife is sitting beside of me and looking at [name], playing farm game or looking at christian music in reference to recent behaviors. Patient reported feeling wife is not paying attention to patient. Patient reported patient would like to talk to his provider and resume duloxetine  before making a decision regarding focus of therapy.   Interventions: Cognitive Behavioral Therapy. Clinician conducted session in person at clinician's office at [facility]. Reviewed events since last session and assessed for changes. Discussed recent lapse in medication, barriers to obtaining medication, and assessed current symptoms.  Provided supportive therapy, active listening, and validation as patient discussed loss of friend. Discussed medical concerns. Discussed contacting provider to address lapse in medication and medical concerns. Explored and identified barriers to disclosing medical concerns and behaviors to wife. Discussed history of addictions. Explored triggers for use of pornography. Discussed clinician's scope of practice and referral to provider with expertise in treatment of sexual addictions.    Collaboration of Care:  Other  not required at this time   Diagnosis:  Major depressive disorder, recurrent episode, moderate (HCC)   Anxiety disorder, unspecified type     Plan: Patient is to utilize [employer] Therapy, thought re-framing, behavioral activation, relaxation techniques, mindfulness and coping strategies to decrease symptoms associated with their diagnosis. Frequency: weekly  Modality: individual      Long-term goal:   Reduce overall level, frequency, and intensity of the feelings of depression and anxiety as evidenced by decreased loss of motivation, loss of interest, psychomotor retardation, worry, lack of energy, difficulty concentrating, depressed mood, difficulty falling asleep and returning to sleep, and inactivity from 7 days/week to 0 to 1 days/week per patient report for at least 3 consecutive months. Target Date: 11/25/24  Progress: progressing    Short-term goal:  Increase patient's participation in physical activities, such as, yard work, walking half way around patient's neighborhood, walking nature trail located near patient's home from 1 time per week to 2 times week Target Date: 05/27/24 - extended to patient's follow up appointment on 07/27/24 due to patient's clinical needs during today's session  Progress: progressing    Increase patient's participation in activities/projects patient enjoys from 0 times per week to 3-4 times per week  Target Date: 05/27/24 - extended to patient's follow up appointment on 07/27/24 due to patient's clinical needs during today's session  Progress: progressing    Identify, challenge, and replace negative core beliefs, thought patterns, and negative self talk that contribute to feelings of depression and anxiety with positive thoughts, beliefs, and positive self talk per patient's report Target Date: 05/27/24 - extended to patient's follow up appointment on 07/27/24 due to patient's clinical needs during today's session  Progress: progressing     Continue to make healthy lifestyle changes, such as, healthy dietary choices and increase physical activity Target Date: 05/27/24 - extended to patient's follow up appointment on 07/27/24 due to patient's clinical needs during today's session  Progress: progressing     Darice Seats, LCSW

## 2024-06-24 ENCOUNTER — Other Ambulatory Visit (HOSPITAL_COMMUNITY): Payer: Self-pay

## 2024-06-24 ENCOUNTER — Other Ambulatory Visit: Payer: Self-pay | Admitting: Internal Medicine

## 2024-06-24 DIAGNOSIS — G479 Sleep disorder, unspecified: Secondary | ICD-10-CM

## 2024-06-25 ENCOUNTER — Other Ambulatory Visit (HOSPITAL_COMMUNITY): Payer: Self-pay

## 2024-06-25 ENCOUNTER — Other Ambulatory Visit: Payer: Self-pay

## 2024-06-25 DIAGNOSIS — R002 Palpitations: Secondary | ICD-10-CM | POA: Diagnosis not present

## 2024-06-25 MED ORDER — TRAZODONE HCL 100 MG PO TABS
100.0000 mg | ORAL_TABLET | Freq: Every day | ORAL | 0 refills | Status: DC
  Filled 2024-06-25: qty 180, 90d supply, fill #0

## 2024-06-28 ENCOUNTER — Encounter: Payer: Self-pay | Admitting: Radiology

## 2024-06-28 DIAGNOSIS — R002 Palpitations: Secondary | ICD-10-CM | POA: Diagnosis not present

## 2024-06-29 ENCOUNTER — Other Ambulatory Visit: Payer: Self-pay

## 2024-06-29 DIAGNOSIS — R319 Hematuria, unspecified: Secondary | ICD-10-CM

## 2024-06-29 DIAGNOSIS — N401 Enlarged prostate with lower urinary tract symptoms: Secondary | ICD-10-CM

## 2024-06-30 ENCOUNTER — Ambulatory Visit: Admitting: Orthopaedic Surgery

## 2024-06-30 ENCOUNTER — Other Ambulatory Visit (INDEPENDENT_AMBULATORY_CARE_PROVIDER_SITE_OTHER): Payer: Self-pay

## 2024-06-30 ENCOUNTER — Encounter: Payer: Self-pay | Admitting: Orthopaedic Surgery

## 2024-06-30 DIAGNOSIS — M25561 Pain in right knee: Secondary | ICD-10-CM

## 2024-06-30 DIAGNOSIS — M1711 Unilateral primary osteoarthritis, right knee: Secondary | ICD-10-CM | POA: Diagnosis not present

## 2024-06-30 DIAGNOSIS — Z96652 Presence of left artificial knee joint: Secondary | ICD-10-CM | POA: Diagnosis not present

## 2024-06-30 DIAGNOSIS — G8929 Other chronic pain: Secondary | ICD-10-CM

## 2024-06-30 MED ORDER — LIDOCAINE HCL 1 % IJ SOLN
3.0000 mL | INTRAMUSCULAR | Status: AC | PRN
  Administered 2024-06-30: 3 mL

## 2024-06-30 MED ORDER — METHYLPREDNISOLONE ACETATE 40 MG/ML IJ SUSP
40.0000 mg | INTRAMUSCULAR | Status: AC | PRN
  Administered 2024-06-30: 40 mg via INTRA_ARTICULAR

## 2024-06-30 NOTE — Progress Notes (Signed)
 The patient is a 68 year old gentleman well-known to us .  Katie had a total knee arthroplasty done 6 months ago for significant arthritis of his left knee.  He says the knee is doing well and has good range of motion and strength.  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  He does have known arthritis of his right knee.  On exam his left operative knee has just minimal swelling.  The range of motion is full and he feels stable.  The right knee has varus malalignment and medial joint line tenderness and patellofemoral crepitation.  X-rays today standing of the left knee show well-seated left total knee arthroplasty with no complicating features.  The AP view also shows his right knee that has varus malalignment and significant medial joint line narrowing.  I did place a steroid injection in his right knee today which he tolerated well.  Will see him back in 6 months to see how he is doing overall but no x-rays are needed lesser issues.  We can then discuss right knee replacement at that point.  If his right knee is getting worse before then he knows to reach out and let us  know.    Procedure Note  Patient: [name]             Date of Birth: 1955/09/14           MRN: [id number]             Visit Date: 06/30/2024  Procedures: Visit Diagnoses:  1. History of left knee replacement   2. Chronic pain of right knee   3. Unilateral primary osteoarthritis, right knee     Large Joint Inj: R knee on 06/30/2024 3:24 PM Indications: diagnostic evaluation and pain Details: 22 G 1.5 in needle, superolateral approach  Arthrogram: No  Medications: 3 mL lidocaine  1 %; 40 mg methylPREDNISolone acetate 40 MG/ML Outcome: tolerated well, no immediate complications Procedure, treatment alternatives, risks and benefits explained, specific risks discussed. Consent was given by the patient. Immediately prior to procedure a time out was called to verify the correct patient,  procedure, equipment, support staff and site/side marked as required. Patient was prepped and draped in the usual sterile fashion.

## 2024-07-01 ENCOUNTER — Other Ambulatory Visit

## 2024-07-01 DIAGNOSIS — E291 Testicular hypofunction: Secondary | ICD-10-CM

## 2024-07-01 DIAGNOSIS — N401 Enlarged prostate with lower urinary tract symptoms: Secondary | ICD-10-CM | POA: Diagnosis not present

## 2024-07-01 DIAGNOSIS — R319 Hematuria, unspecified: Secondary | ICD-10-CM

## 2024-07-01 LAB — URINALYSIS, COMPLETE
Bilirubin, UA: NEGATIVE
Glucose, UA: NEGATIVE
Ketones, UA: NEGATIVE
Nitrite, UA: NEGATIVE
Protein,UA: NEGATIVE
RBC, UA: NEGATIVE
Specific Gravity, UA: 1.01 (ref 1.005–1.030)
Urobilinogen, Ur: 1 mg/dL (ref 0.2–1.0)
pH, UA: 6 (ref 5.0–7.5)

## 2024-07-01 LAB — MICROSCOPIC EXAMINATION: Epithelial Cells (non renal): >10 /HPF — AB (ref 0–10)

## 2024-07-02 ENCOUNTER — Ambulatory Visit: Payer: Self-pay | Admitting: Urology

## 2024-07-02 LAB — HEMOGLOBIN AND HEMATOCRIT, BLOOD
Hematocrit: 48.5 % (ref 37.5–51.0)
Hemoglobin: 15.4 g/dL (ref 13.0–17.7)

## 2024-07-02 LAB — PSA: Prostate Specific Ag, Serum: 0.5 ng/mL (ref 0.0–4.0)

## 2024-07-02 LAB — TESTOSTERONE: Testosterone: 518 ng/dL (ref 264–916)

## 2024-07-19 ENCOUNTER — Other Ambulatory Visit: Payer: Self-pay

## 2024-07-19 ENCOUNTER — Other Ambulatory Visit (HOSPITAL_BASED_OUTPATIENT_CLINIC_OR_DEPARTMENT_OTHER): Payer: Self-pay

## 2024-07-19 MED ORDER — METHYLPHENIDATE HCL 10 MG PO TABS
10.0000 mg | ORAL_TABLET | Freq: Two times a day (BID) | ORAL | 0 refills | Status: DC
  Filled 2024-07-19: qty 60, 30d supply, fill #0

## 2024-07-26 ENCOUNTER — Ambulatory Visit

## 2024-07-26 VITALS — BP 112/66 | HR 57 | Ht 69.0 in | Wt 277.4 lb

## 2024-07-26 DIAGNOSIS — G4733 Obstructive sleep apnea (adult) (pediatric): Secondary | ICD-10-CM

## 2024-07-26 DIAGNOSIS — Z23 Encounter for immunization: Secondary | ICD-10-CM | POA: Diagnosis not present

## 2024-07-26 DIAGNOSIS — Z6841 Body Mass Index (BMI) 40.0 and over, adult: Secondary | ICD-10-CM

## 2024-07-26 NOTE — Assessment & Plan Note (Addendum)
 SABRA

## 2024-07-26 NOTE — Patient Instructions (Signed)
  VISIT SUMMARY: Today, you came in for a follow-up visit regarding your sleep apnea. You have been managing your condition well with the use of a CPAP machine and have also experienced significant weight loss through the [facility] program.  YOUR PLAN: -OBSTRUCTIVE SLEEP APNEA: Obstructive sleep apnea is a condition where your breathing repeatedly stops and starts during sleep due to blocked airways. You are managing this condition well with your CPAP machine, which helps keep your airways open. Continue using your CPAP machine with the current settings, ensure it is cleaned regularly, and use distilled water  in the humidifier.  INSTRUCTIONS: Please schedule a follow-up appointment in one year to monitor your condition and ensure your treatment remains effective.                      Contains text generated by Abridge.                                 Contains text generated by Abridge.
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
## 2024-07-27 ENCOUNTER — Ambulatory Visit: Admitting: Clinical

## 2024-07-27 DIAGNOSIS — F419 Anxiety disorder, unspecified: Secondary | ICD-10-CM | POA: Diagnosis not present

## 2024-07-27 DIAGNOSIS — F331 Major depressive disorder, recurrent, moderate: Secondary | ICD-10-CM | POA: Diagnosis not present

## 2024-07-27 NOTE — Progress Notes (Signed)
   Darice Seats, LCSW

## 2024-07-27 NOTE — Progress Notes (Signed)
 [facility] Counselor/Therapist Progress Note  Patient ID: [name], MRN: [id number],    Date: 07/27/2024  Time Spent: 1:34pm - 2:29pm : 55 minutes   Treatment Type: Individual Therapy  Reported Symptoms: decreased motivation and energy  Mental Status Exam: Appearance:  Neat and Well Groomed     Behavior: Appropriate  Motor: Normal  Speech/Language:  Clear and Coherent and Normal Rate  Affect: Appropriate  Mood: normal  Thought process: normal  Thought content:   WNL  Sensory/Perceptual disturbances:   WNL  Orientation: oriented to person, place, time/date, and situation  Attention: Good  Concentration: Good  Memory: WNL  Fund of knowledge:  Good  Insight:   Good  Judgment:  Good  Impulse Control: Good   Risk Assessment: Danger to Self:  No Patient denied current suicidal ideation  Self-injurious Behavior: No Danger to Others: No Patient denied current homicidal ideation Duty to Warn:no Physical Aggression / Violence:No  Access to Firearms a concern: No  Gang Involvement:No   Subjective:  Patient stated, they've been good in response to events since last session and reported no changes since last session. Patient stated, I'm feeling a lot better in response to mood since last session and reported was able to resume medications. Patient stated, I'm in a good mood today. Patient stated, its not going the way I would like it to, I just don't want to do it in response to patient's first short term goal. Patient reported zero motivation and energy.  Patient reported a decrease in motivation when the activity is an independent activity. Patient stated, I also create excuses in reference to participating in activities. Patient stated, Its almost like I don't care anymore in reference to participation in activities. Patient reported a decrease in motivation related to performing hygiene daily. Patient reported patient would like to determine the  cause of decrease in motivation. Patient stated, I think some of it is not having a social life in reference to depressive symptoms.  Patient stated, things can happen if I'm put on a time line in reference to motivation to completing/participating in activities. Patient stated, still working on that in response to patient's fourth short term goal.   Interventions: Cognitive Behavioral Therapy. Clinician conducted session in person at clinician's office at [facility]. Reviewed events since last session and assessed for changes. Reviewed patient's goals for therapy and patient's progress towards goals. Discussed decrease in motivation and energy. Discussed barriers to participation in activities. Provided psycho education related to depressive symptoms. Discussed patient initiating a conversation with PCP regarding depressive symptoms and treatment. Provided psycho education related to psychotropic medications. Discussed referral to a psychiatrist and patient's ambivalence regarding referral. Provided psycho education related to the benefits of socialization and psycho education related to grief.   Collaboration of Care: Primary Care Provider AEB Discussed consent for patient's new PCP, Dr. Reuben Burkes  Patient/Guardian was advised Release of Information must be obtained prior to any record release in order to collaborate their care with an outside provider. Patient/Guardian was advised if they have not already done so to contact [facility] to sign all necessary forms in order for us  to release information regarding their care.    Diagnosis:  Major depressive disorder, recurrent episode, moderate (HCC)   Anxiety disorder, unspecified type     Plan: Patient is to utilize [employer] [facility], thought re-framing, behavioral activation, relaxation techniques, mindfulness and coping strategies to decrease symptoms associated with their diagnosis. Frequency: weekly   Modality:  individual      Long-term goal:   Reduce overall level, frequency, and intensity of the feelings of depression and anxiety as evidenced by decreased loss of motivation, loss of interest, psychomotor retardation, worry, lack of energy, difficulty concentrating, depressed mood, difficulty falling asleep and returning to sleep, and inactivity from 7 days/week to 0 to 1 days/week per patient report for at least 3 consecutive months. Target Date: 11/25/24  Progress: progressing    Short-term goal:  Increase patient's participation in physical activities, such as, yard work, walking half way around patient's neighborhood, walking nature trail located near patient's home from 1 time per week to 2 times week Target Date: 11/25/24  Progress: progressing    Increase patient's participation in activities/projects patient enjoys from 0 times per week to 3-4 times per week  Target Date: 11/25/24  Progress: progressing    Identify, challenge, and replace negative core beliefs, thought patterns, and negative self talk that contribute to feelings of depression and anxiety with positive thoughts, beliefs, and positive self talk per patient's report  Target Date: 11/25/24  Progress: progressing    Continue to make healthy lifestyle changes, such as, healthy dietary choices and increase physical activity  Target Date: 11/25/24  Progress: progressing    Darice Seats, LCSW

## 2024-08-02 ENCOUNTER — Encounter (INDEPENDENT_AMBULATORY_CARE_PROVIDER_SITE_OTHER): Payer: Self-pay

## 2024-08-02 ENCOUNTER — Ambulatory Visit (INDEPENDENT_AMBULATORY_CARE_PROVIDER_SITE_OTHER): Payer: Self-pay | Admitting: Internal Medicine

## 2024-08-03 ENCOUNTER — Other Ambulatory Visit (HOSPITAL_COMMUNITY): Payer: Self-pay

## 2024-08-16 ENCOUNTER — Other Ambulatory Visit: Payer: Self-pay | Admitting: Family

## 2024-08-16 MED ORDER — METHYLPHENIDATE HCL 10 MG PO TABS
10.0000 mg | ORAL_TABLET | Freq: Two times a day (BID) | ORAL | 0 refills | Status: AC
  Filled 2024-08-16: qty 60, 30d supply, fill #0

## 2024-08-17 ENCOUNTER — Other Ambulatory Visit: Payer: Self-pay

## 2024-08-30 ENCOUNTER — Other Ambulatory Visit: Payer: Self-pay

## 2024-08-30 ENCOUNTER — Other Ambulatory Visit (HOSPITAL_COMMUNITY): Payer: Self-pay

## 2024-08-31 ENCOUNTER — Ambulatory Visit (INDEPENDENT_AMBULATORY_CARE_PROVIDER_SITE_OTHER): Admitting: Internal Medicine

## 2024-08-31 ENCOUNTER — Encounter (INDEPENDENT_AMBULATORY_CARE_PROVIDER_SITE_OTHER): Payer: Self-pay | Admitting: Internal Medicine

## 2024-08-31 VITALS — BP 130/80 | HR 60 | Temp 97.4°F | Ht 69.0 in | Wt 270.0 lb

## 2024-08-31 DIAGNOSIS — G4733 Obstructive sleep apnea (adult) (pediatric): Secondary | ICD-10-CM | POA: Diagnosis not present

## 2024-08-31 DIAGNOSIS — I10 Essential (primary) hypertension: Secondary | ICD-10-CM

## 2024-08-31 DIAGNOSIS — Z6838 Body mass index (BMI) 38.0-38.9, adult: Secondary | ICD-10-CM | POA: Diagnosis not present

## 2024-08-31 DIAGNOSIS — R948 Abnormal results of function studies of other organs and systems: Secondary | ICD-10-CM

## 2024-08-31 DIAGNOSIS — E88819 Insulin resistance, unspecified: Secondary | ICD-10-CM | POA: Diagnosis not present

## 2024-08-31 DIAGNOSIS — I5032 Chronic diastolic (congestive) heart failure: Secondary | ICD-10-CM | POA: Diagnosis not present

## 2024-08-31 DIAGNOSIS — I110 Hypertensive heart disease with heart failure: Secondary | ICD-10-CM | POA: Diagnosis not present

## 2024-08-31 DIAGNOSIS — E66812 Obesity, class 2: Secondary | ICD-10-CM

## 2024-08-31 NOTE — Progress Notes (Unsigned)
 "  Office: [phone]  /  Fax: [phone]  Weight Summary and Body Composition Analysis (BIA)  Vitals Temp: (!) 97.4 F (36.3 C) BP: 130/80 Pulse Rate: 60 SpO2: 95 %   Anthropometric Measurements Height: 5' 9 (1.753 m) Weight: 270 lb (122.5 kg) BMI (Calculated): 39.85 Weight at Last Visit: 270 lb Weight Lost Since Last Visit: 0 lb Weight Gained Since Last Visit: 0 lb Starting Weight: 320 lb Total Weight Loss (lbs): 50 lb (22.7 kg) Peak Weight: 382 lb   Body Composition  Body Fat %: 40.4 % Fat Mass (lbs): 109.2 lbs Muscle Mass (lbs): 153 lbs Total Body Water  (lbs): 115.6 lbs Visceral Fat Rating : 27    RMR: [id number]  Today's Visit #: 27  Starting Date: 07/09/22   Subjective   Chief Complaint: Obesity  Interval History  Discussed the use of AI scribe software for clinical note transcription with the patient, who gave verbal consent to proceed.  History of Present Illness [name] is a 69 year old male with obesity-related conditions who presents for medical weight management.    He has a history of obesity-related conditions including coronary artery disease, heart failure with preserved ejection fraction, hypertension, and sleep apnea for which he uses CPAP. He also has insulin  resistance. Over the holidays, he maintained his weight and is following a 1500 calorie nutrition plan about 80% of the time. However, he has not been exercising due to knee problems.  He has a history of depression, which he attributes to a physical condition involving his brain stem and spinal fluid. He mentions that several of his medications, including metoprolol , gabapentin , and Cymbalta , contribute to his fatigue and depression. He is currently taking bupropion , which he finds stimulating, and trazodone , which he feels has a hangover effect.  He struggles with carbohydrate intake, particularly over the holidays, and finds that artificial sweeteners upset his stomach. He is  trying to control his eating habits and struggles with carbohydrate intake. He is not currently scheduled for knee surgery but acknowledges that his knee issues limit his physical activity.  He has experienced significant weight loss, as evidenced by a reduction in waist circumference from 61 inches to 49.5 inches. He is currently at a weight of 270 pounds with a BMI of 39. His pants size has decreased from 44 to 42, indicating further weight loss.  He is working on increasing his protein intake but finds it challenging due to dietary preferences and intolerances. He is exploring various protein sources such as Greek yogurt, protein smoothies, and chicken salad.     Challenges affecting patient progress: orthopedic problems.    Pharmacotherapy for weight management: He is currently taking no anti-obesity medication and patient has declined pharmacotherapy in past.   Assessment and Plan   Treatment Plan For Obesity:  Recommended Dietary Goals  [name] is currently in the action stage of change. As such, his goal is to continue weight management plan. He has agreed to: follow the Category 3 plan - 1500 kcal per day, incorporate prepackaged healthy meals for convenience, and incorporate 1-2 meal replacements a day for convenience   Behavioral Health and Counseling  We discussed the following behavioral modification strategies today: increasing lean protein intake to established goals, decreasing simple carbohydrates , increasing vegetables, increasing lower glycemic fruits, increasing fiber rich foods, avoiding skipping meals, increasing water  intake , work on meal planning and preparation, work on tracking and journaling calories using tracking application, and getting back on track after recent lapse.  Additional education and resources provided today: None  Recommended Physical Activity Goals  [name] has been advised to work up to 150 minutes of moderate intensity aerobic activity a week  and strengthening exercises 2-3 times per week for cardiovascular health, weight loss maintenance and preservation of muscle mass.  He has agreed to :  Think about enjoyable ways to increase daily physical activity and overcoming barriers to exercise and Increase physical activity in their day and reduce sedentary time (increase NEAT).  Medical Interventions and Pharmacotherapy  We discussed various medication options to help [name] with his weight loss efforts and we both agreed to : Continue with current nutritional and behavioral strategies, Has declined pharmacotherapy, and will consider treatment with GLP-1 if lifestyle changes do not suffice.  Associated Conditions Impacted by Obesity Treatment  Assessment & Plan OSA on CPAP On CPAP with reported good compliance. He has lost close to 20% of total body weight. He uses CPAP consistently and reports inability to sleep without it, indicating ongoing need for CPAP therapy despite significant weight loss. - Continue CPAP therapy.  Chronic heart failure with preserved ejection fraction (HFpEF) (HCC) Blood pressure is well-controlled.  He appears to be euvolemic based on bioimpedance and uses a loop diuretic as needed.  Continue current regimen Essential hypertension His blood pressure is well-controlled.  He is currently on amlodipine , metoprolol  and isosorbide .  He is on methylphenidate  which may increase his blood pressure.  He has lost a significant amount of weight which has improved his blood pressure control.  Continue current regimen. Insulin  resistance At the beginning of our program he had an insulin  level of 35 consistent with hyperinsulinemia or insulin  resistance.  Follow-up testing shows a reduction to 17.5 overall 50% reduction.  We will check glucose parameters despairing in a fasting state.   Class 2 severe obesity with serious comorbidity and body mass index (BMI) of 38.0 to 38.9 in adult, unspecified obesity type Weight:  decrease of 60.8 lb (18.4%) over 2 years, 4 months  Start: 04/22/2022 330 lb 12.8 oz (150 kg) (H)  End: 08/31/2024 270 lb (122.5 kg)   BMI of 39, weight at 270 lbs, and waist circumference of 49.5 inches. Weight loss achieved through a 1500 calorie nutrition plan, with challenges in maintaining protein intake and carbohydrate control. Knee problems limit physical activity. Potential use of GLP-1 agonists like Wegovy discussed if weight loss plateaus, considering coronary artery disease, heart failure with preserved ejection fraction, and sleep apnea. Potential weight loss of 15% with Wegovy could result in a 40-pound reduction, improving knee pressure, back pain, and breathing. - Continue 1500 calorie nutrition plan. - Increase protein intake using options like Oikos triple zero yogurt, protein smoothies, and protein powder. - Consider GLP-1 agonists like Wegovy if weight loss plateaus. - Monitor weight weekly to avoid negative psychological impact from frequent weighing. Abnormal metabolism Due to age and orthopedic problems.  We have discussed the importance of maintaining adequate protein intake, frequency of meals and engaging in strengthening exercises to improve metabolic rate..          Objective   Physical Exam:  Blood pressure 130/80, pulse 60, temperature (!) 97.4 F (36.3 C), height 5' 9 (1.753 m), weight 270 lb (122.5 kg), SpO2 95%. Body mass index is 39.87 kg/m.  General: He is overweight, cooperative, alert, well developed, and in no acute distress. PSYCH: Has normal mood, affect and thought process.   HEENT: EOMI, sclerae are anicteric. Lungs: Normal breathing effort, no conversational dyspnea. Extremities:  No edema.  Neurologic: No gross sensory or motor deficits. No tremors or fasciculations noted.    Diagnostic Data Reviewed:  BMET    Component Value Date/Time   NA 142 04/11/2024 1242   NA 143 10/08/2022 1226   K 4.0 04/11/2024 1242   CL 105 04/11/2024 1242    CO2 27 04/11/2024 1242   GLUCOSE 97 04/11/2024 1242   BUN 25 (H) 04/11/2024 1242   BUN 22 10/08/2022 1226   CREATININE 0.93 04/11/2024 1242   CALCIUM  8.9 04/11/2024 1242   GFRNONAA >60 04/11/2024 1242   GFRAA >60 02/10/2020 0937   Lab Results  Component Value Date   HGBA1C 5.3 06/30/2023   HGBA1C 5.4 07/09/2022   Lab Results  Component Value Date   INSULIN  17.5 06/30/2023   INSULIN  35.5 (H) 07/09/2022   Lab Results  Component Value Date   TSH 2.41 03/06/2023   CBC    Component Value Date/Time   WBC 7.7 04/11/2024 1242   RBC 5.24 04/11/2024 1242   HGB 15.4 07/01/2024 1103   HCT 48.5 07/01/2024 1103   PLT 216 04/11/2024 1242   PLT 239 11/23/2021 1024   MCV 83.8 04/11/2024 1242   MCV 87 11/23/2021 1024   MCH 26.7 04/11/2024 1242   MCHC 31.9 04/11/2024 1242   RDW 16.3 (H) 04/11/2024 1242   RDW 13.4 11/23/2021 1024   Iron Studies No results found for: IRON, TIBC, FERRITIN, IRONPCTSAT Lipid Panel     Component Value Date/Time   CHOL 57 (L) 06/02/2024 1453   TRIG 74 06/02/2024 1453   HDL 21 (L) 06/02/2024 1453   CHOLHDL 2.7 06/02/2024 1453   CHOLHDL 3.0 03/18/2023 0635   VLDL 11 03/18/2023 0635   LDLCALC 20 06/02/2024 1453   Hepatic Function Panel     Component Value Date/Time   PROT 7.5 04/11/2024 1242   PROT 6.7 10/08/2022 1226   ALBUMIN 3.8 04/11/2024 1242   ALBUMIN 4.1 10/08/2022 1226   AST 24 04/11/2024 1242   ALT 34 04/11/2024 1242   ALKPHOS 105 04/11/2024 1242   BILITOT 0.7 04/11/2024 1242   BILITOT 0.3 10/08/2022 1226   BILIDIR 0.1 04/11/2024 1242   BILIDIR <0.10 02/13/2021 0830   IBILI 0.6 04/11/2024 1242      Component Value Date/Time   TSH 2.41 03/06/2023 1552   Nutritional Lab Results  Component Value Date   VD25OH 61.4 10/08/2022   VD25OH 18.2 (L) 07/09/2022    Medications: Outpatient Encounter Medications as of 08/31/2024  Medication Sig   amLODipine  (NORVASC ) 5 MG tablet Take 1 tablet (5 mg total) by mouth daily.    aspirin  81 MG chewable tablet Chew 1 tablet (81 mg total) by mouth 2 (two) times daily.   atorvastatin  (LIPITOR) 20 MG tablet Take 1 tablet (20 mg total) by mouth daily.   buPROPion  (WELLBUTRIN  XL) 300 MG 24 hr tablet Take 1 tablet (300 mg total) by mouth every morning.   Cholecalciferol (VITAMIN D3) 50 MCG (2000 UT) capsule Take 1 capsule (2,000 Units total) by mouth daily.   cyanocobalamin  (VITAMIN B12) 1000 MCG tablet Take 1 tablet (1,000 mcg total) by mouth daily.   DULoxetine  (CYMBALTA ) 60 MG capsule TAKE 1 CAPSULE EVERY       MORNING   furosemide  (LASIX ) 20 MG tablet Take 1 tablet (20mg ) by mouth daily AND take 1 additional tablet (20mg ) by mouth as needed for excess fluid).   gabapentin  (NEURONTIN ) 600 MG tablet Take 2 tablets (1,200 mg total) by mouth at  bedtime.   isosorbide  mononitrate (IMDUR ) 60 MG 24 hr tablet Take 1 tablet (60 mg total) by mouth 2 (two) times daily.   levothyroxine  (SYNTHROID ) 88 MCG tablet Take 1 tablet (88 mcg total) by mouth daily before breakfast.   methylphenidate  (RITALIN ) 10 MG tablet Take 1 tablet (10 mg total) by mouth 2 (two) times daily with breakfast and lunch.   metoprolol  succinate (TOPROL -XL) 25 MG 24 hr tablet Take 1 tablet (25 mg total) by mouth daily.   mometasone (NASONEX) 50 MCG/ACT nasal spray Place 2 sprays into the nose daily as needed (allergies).   Multiple Vitamins-Minerals (MULTI FOR HIM 50+) TABS Take 1 tablet by mouth 4 (four) times a week.   NEEDLE, DISP, 18 G (BD DISP NEEDLES) 18G X 1-1/2 MISC use every 14 (fourteen) days for injecting testosterone    NEEDLE, DISP, 18 G 18G X 1-1/2 MISC Use 1 needle every 14 days.   NEEDLE, DISP, 21 G (BD DISP NEEDLES) 21G X 1-1/2 MISC use to inject testosterone  every 14 (fourteen) days.   NEEDLE, DISP, 21 G (BD ECLIPSE NEEDLE) 21G X 1-1/2 MISC Use 1 needle every 14 day.s   nitroGLYCERIN  (NITROSTAT ) 0.3 MG SL tablet Place 1 tablet (0.3 mg total) under the tongue every 5 (five) minutes as needed for  chest pain. Max of 3 doses within 15 minutes   NON FORMULARY Pt uses c-pap nightly   Syringe, Disposable, (2-3CC SYRINGE) 3 ML MISC Use to inect testosterone  every 14 (fourteen) days.   testosterone  cypionate (DEPOTESTOSTERONE CYPIONATE) 200 MG/ML injection Inject 1 mL (200 mg total) into the muscle every 14 (fourteen) days.   traZODone  (DESYREL ) 100 MG tablet Take 1-2 tablets (100-200 mg total) by mouth at bedtime.   No facility-administered encounter medications on file as of 08/31/2024.     Follow-Up   Return in about 4 weeks (around 09/28/2024) for For Weight Mangement with Dr. Francyne.SABRA He was informed of the importance of frequent follow up visits to maximize his success with intensive lifestyle modifications for his multiple health conditions.  Attestation Statement   Reviewed by clinician on day of visit: allergies, medications, problem list, medical history, surgical history, family history, social history, and previous encounter notes.     Lucas Francyne, MD  "

## 2024-09-01 NOTE — Assessment & Plan Note (Signed)
 Due to age and orthopedic problems.  We have discussed the importance of maintaining adequate protein intake, frequency of meals and engaging in strengthening exercises to improve metabolic rate.SABRA

## 2024-09-01 NOTE — Assessment & Plan Note (Signed)
 On CPAP with reported good compliance. He has lost close to 20% of total body weight. He uses CPAP consistently and reports inability to sleep without it, indicating ongoing need for CPAP therapy despite significant weight loss. - Continue CPAP therapy.

## 2024-09-01 NOTE — Assessment & Plan Note (Signed)
 His blood pressure is well-controlled.  He is currently on amlodipine , metoprolol  and isosorbide .  He is on methylphenidate  which may increase his blood pressure.  He has lost a significant amount of weight which has improved his blood pressure control.  Continue current regimen.

## 2024-09-01 NOTE — Assessment & Plan Note (Signed)
 Blood pressure is well-controlled.  He appears to be euvolemic based on bioimpedance and uses a loop diuretic as needed.  Continue current regimen

## 2024-09-01 NOTE — Assessment & Plan Note (Signed)
 At the beginning of our program he had an insulin  level of 35 consistent with hyperinsulinemia or insulin  resistance.  Follow-up testing shows a reduction to 17.5 overall 50% reduction.  We will check glucose parameters despairing in a fasting state.

## 2024-09-01 NOTE — Assessment & Plan Note (Signed)
 Weight: decrease of 60.8 lb (18.4%) over 2 years, 4 months  Start: 04/22/2022 330 lb 12.8 oz (150 kg) (H)  End: 08/31/2024 270 lb (122.5 kg)   BMI of 39, weight at 270 lbs, and waist circumference of 49.5 inches. Weight loss achieved through a 1500 calorie nutrition plan, with challenges in maintaining protein intake and carbohydrate control. Knee problems limit physical activity. Potential use of GLP-1 agonists like Wegovy discussed if weight loss plateaus, considering coronary artery disease, heart failure with preserved ejection fraction, and sleep apnea. Potential weight loss of 15% with Wegovy could result in a 40-pound reduction, improving knee pressure, back pain, and breathing. - Continue 1500 calorie nutrition plan. - Increase protein intake using options like Oikos triple zero yogurt, protein smoothies, and protein powder. - Consider GLP-1 agonists like Wegovy if weight loss plateaus. - Monitor weight weekly to avoid negative psychological impact from frequent weighing.

## 2024-09-04 ENCOUNTER — Other Ambulatory Visit: Payer: Self-pay

## 2024-09-06 ENCOUNTER — Ambulatory Visit

## 2024-09-06 VITALS — BP 112/66 | HR 66 | Temp 97.7°F | Ht 69.0 in | Wt 275.0 lb

## 2024-09-06 DIAGNOSIS — F900 Attention-deficit hyperactivity disorder, predominantly inattentive type: Secondary | ICD-10-CM | POA: Insufficient documentation

## 2024-09-06 DIAGNOSIS — E538 Deficiency of other specified B group vitamins: Secondary | ICD-10-CM | POA: Diagnosis not present

## 2024-09-06 DIAGNOSIS — E038 Other specified hypothyroidism: Secondary | ICD-10-CM

## 2024-09-06 DIAGNOSIS — G479 Sleep disorder, unspecified: Secondary | ICD-10-CM

## 2024-09-06 DIAGNOSIS — F330 Major depressive disorder, recurrent, mild: Secondary | ICD-10-CM | POA: Diagnosis not present

## 2024-09-06 DIAGNOSIS — E559 Vitamin D deficiency, unspecified: Secondary | ICD-10-CM

## 2024-09-06 MED ORDER — TRAZODONE HCL 100 MG PO TABS: 300.0000 mg | ORAL_TABLET | Freq: Every day | ORAL | Status: DC

## 2024-09-06 MED ORDER — DULOXETINE HCL 60 MG PO CPEP: 120.0000 mg | ORAL_CAPSULE | Freq: Every morning | ORAL | Status: DC

## 2024-09-06 MED ORDER — ASPIRIN 81 MG PO CHEW: 81.0000 mg | CHEWABLE_TABLET | Freq: Every day | ORAL | Status: AC
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
## 2024-09-06 NOTE — Patient Instructions (Addendum)
 Thank you for visiting [facility] today! Here's what we talked about: - START taking 2 of the Cymbalta  together for total of 120mg  - START taking synthroid  on empty stomach, 1hr before food/other meds - START taking 3 tablets of the Trazodone 

## 2024-09-08 ENCOUNTER — Other Ambulatory Visit: Payer: Self-pay | Admitting: Family

## 2024-09-08 ENCOUNTER — Encounter (HOSPITAL_COMMUNITY): Payer: Self-pay | Admitting: Pharmacist

## 2024-09-08 ENCOUNTER — Ambulatory Visit: Admitting: Clinical

## 2024-09-08 ENCOUNTER — Other Ambulatory Visit (HOSPITAL_COMMUNITY): Payer: Self-pay

## 2024-09-08 ENCOUNTER — Other Ambulatory Visit: Payer: Self-pay

## 2024-09-08 ENCOUNTER — Other Ambulatory Visit (INDEPENDENT_AMBULATORY_CARE_PROVIDER_SITE_OTHER)

## 2024-09-08 DIAGNOSIS — G479 Sleep disorder, unspecified: Secondary | ICD-10-CM

## 2024-09-08 DIAGNOSIS — E559 Vitamin D deficiency, unspecified: Secondary | ICD-10-CM | POA: Diagnosis not present

## 2024-09-08 DIAGNOSIS — F900 Attention-deficit hyperactivity disorder, predominantly inattentive type: Secondary | ICD-10-CM | POA: Diagnosis not present

## 2024-09-08 DIAGNOSIS — F331 Major depressive disorder, recurrent, moderate: Secondary | ICD-10-CM

## 2024-09-08 DIAGNOSIS — E538 Deficiency of other specified B group vitamins: Secondary | ICD-10-CM | POA: Diagnosis not present

## 2024-09-08 DIAGNOSIS — F419 Anxiety disorder, unspecified: Secondary | ICD-10-CM | POA: Diagnosis not present

## 2024-09-08 DIAGNOSIS — F330 Major depressive disorder, recurrent, mild: Secondary | ICD-10-CM

## 2024-09-08 LAB — VITAMIN D 25 HYDROXY (VIT D DEFICIENCY, FRACTURES): VITD: 42.25 ng/mL (ref 30.00–100.00)

## 2024-09-08 LAB — VITAMIN B12: Vitamin B-12: 262 pg/mL (ref 211–911)

## 2024-09-08 MED ORDER — DULOXETINE HCL 60 MG PO CPEP
120.0000 mg | ORAL_CAPSULE | Freq: Every morning | ORAL | 3 refills | Status: AC
  Filled 2024-09-08: qty 180, 90d supply, fill #0

## 2024-09-08 MED ORDER — TRAZODONE HCL 300 MG PO TABS
300.0000 mg | ORAL_TABLET | Freq: Every day | ORAL | 1 refills | Status: AC
  Filled 2024-09-08: qty 30, 30d supply, fill #0

## 2024-09-08 NOTE — Progress Notes (Signed)
   Darice Seats, LCSW

## 2024-09-08 NOTE — Progress Notes (Signed)
 "  [facility] Counselor/Therapist Progress Note  Patient ID: [name], MRN: [id number],    Date: 09/08/2024  Time Spent: 8:35am - 9:24am : 49 minutes   Treatment Type: Individual Therapy  Reported Symptoms: recent decrease in sleep  Mental Status Exam: Appearance:  Casual and Neat     Behavior: Appropriate  Motor: Normal  Speech/Language:  Clear and Coherent and Normal Rate  Affect: Appropriate  Mood: normal  Thought process: normal  Thought content:   WNL  Sensory/Perceptual disturbances:   WNL  Orientation: oriented to person, place, and situation  Attention: Good  Concentration: Good  Memory: WNL  Fund of knowledge:  Good  Insight:   Good  Judgment:  Good  Impulse Control: Good   Risk Assessment: Danger to Self:  No Patient denied current suicidal ideation  Self-injurious Behavior: No Danger to Others: No Patient denied current homicidal ideation Duty to Warn:no Physical Aggression / Violence:No  Access to Firearms a concern: No  Gang Involvement:No   Subjective: Patient stated, something happened yesterday and something happened last Monday in response to events since last session. 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 Patient reported patient's PCP increased patient's medication (duloxetine  was increased to 120 mg per day and trazodone  increased to 300 mg per night). Patient stated, I have not slept good in over a week. Patient reported patient has observed an improvement in sleep with increase in trazodone . Patient reported sleep has increased by 2-3 hours per night since increase in trazodone . Patient reported irritability when sleep is decreased. Patient reported patient has worked on two projects in patient's home and stated, I'm feeling motivated. 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 Patient stated, It gave me a sense of that they recognize that I can do it and they offered to let me do it in reference to appointment to treasurer. Patient stated, It makes me happy and stated, I'm looking forward to the work in reference to recent appointments. Patient stated, I always try to keep my mood upbeat, it's forced sometimes in reference to patient's mood. Patient stated, last night I had a good night's sleep and I'm upbeat in reference to current mood. Patient stated, I've learned more control in reference to managing patient's emotions. Patient reported patient experienced a panic attack on Sunday night and reported feeling patient had a task to complete but could not identify the task. Patient stated,  I felt like I had to get up and go to work the next day, there was nothing going on, there was nothing forgotten. Patient reported patient practiced positive self talk in response to panic attack.    Interventions: Cognitive Behavioral Therapy. Clinician conducted session in person at clinician's office at [facility]. Reviewed events since last session and assessed for changes. Discussed recent changes in medications and symptoms. Discussed positive events and patient's response. Explored feelings of affirmation. Reviewed patient's practice of positive self talk and anger management skills in response to recent situations (medical bill, dishes in sink, panic attack). Provided psycho education related to the benefits of positive activities and exercises of gratitude on mood. Explored thoughts associated with panic attack. Reviewed positive self talk. Discussed anger management strategies, such as, recognizing warning signs and anger stop sign.    Collaboration of Care: Primary Care Provider AEB patient inquired about consent for patient's new PCP, Dr.  Nahosi Bowa    Diagnosis:  Major depressive disorder, recurrent episode, moderate (HCC)   Anxiety disorder,  unspecified type     Plan: Patient is to utilize [employer], thought re-framing, behavioral activation, relaxation techniques, mindfulness and coping strategies to decrease symptoms associated with their diagnosis. Frequency: weekly  Modality: individual      Long-term goal:   Reduce overall level, frequency, and intensity of the feelings of depression and anxiety as evidenced by decreased loss of motivation, loss of interest, psychomotor retardation, worry, lack of energy, difficulty concentrating, depressed mood, difficulty falling asleep and returning to sleep, and inactivity from 7 days/week to 0 to 1 days/week per patient report for at least 3 consecutive months. Target Date: 11/25/24  Progress: progressing    Short-term goal:  Increase patient's participation in physical activities, such as, yard work, walking half way around patient's neighborhood, walking nature trail located near patient's home from 1 time per week to 2 times week Target Date: 11/25/24  Progress: progressing    Increase patient's participation in activities/projects patient enjoys from 0 times per week to 3-4 times per week  Target Date: 11/25/24  Progress: progressing    Identify, challenge, and replace negative core beliefs, thought patterns, and negative self talk that contribute to feelings of depression and anxiety with positive thoughts, beliefs, and positive self talk per patient's report  Target Date: 11/25/24  Progress: progressing    Continue to make healthy lifestyle changes, such as, healthy dietary choices and increase physical activity  Target Date: 11/25/24  Progress: progressing     Darice Seats, LCSW    "

## 2024-09-09 ENCOUNTER — Other Ambulatory Visit: Payer: Self-pay

## 2024-09-09 ENCOUNTER — Other Ambulatory Visit (HOSPITAL_COMMUNITY): Payer: Self-pay

## 2024-09-09 LAB — DRUG MONITORING, PANEL 8 WITH CONFIRMATION, URINE
6 Acetylmorphine: NEGATIVE ng/mL
Alcohol Metabolites: NEGATIVE ng/mL
Amphetamines: NEGATIVE ng/mL
Benzodiazepines: NEGATIVE ng/mL
Buprenorphine, Urine: NEGATIVE ng/mL
Cocaine Metabolite: NEGATIVE ng/mL
Creatinine: 59 mg/dL
MDMA: NEGATIVE ng/mL
Marijuana Metabolite: NEGATIVE ng/mL
Opiates: NEGATIVE ng/mL
Oxidant: NEGATIVE ug/mL
Oxycodone: NEGATIVE ng/mL
pH: 6.3 (ref 4.5–9.0)

## 2024-09-09 LAB — DM TEMPLATE

## 2024-09-14 ENCOUNTER — Other Ambulatory Visit: Payer: Self-pay

## 2024-09-15 ENCOUNTER — Ambulatory Visit: Payer: Self-pay

## 2024-09-20 ENCOUNTER — Other Ambulatory Visit: Payer: Self-pay

## 2024-09-21 ENCOUNTER — Ambulatory Visit: Admitting: Clinical

## 2024-09-21 DIAGNOSIS — F419 Anxiety disorder, unspecified: Secondary | ICD-10-CM

## 2024-09-21 DIAGNOSIS — F331 Major depressive disorder, recurrent, moderate: Secondary | ICD-10-CM | POA: Diagnosis not present

## 2024-09-21 NOTE — Progress Notes (Signed)
 "  [facility] Counselor/Therapist Progress Note  Patient ID: [name], MRN: [id number],    Date: 09/21/2024  Time Spent: 8:33am - 9:26am : 53 minutes   Treatment Type: Individual Therapy  Reported Symptoms: difficulty falling asleep   Mental Status Exam: Appearance:  Neat and Well Groomed     Behavior: Appropriate  Motor: Normal  Speech/Language:  Clear and Coherent and Normal Rate  Affect: Appropriate  Mood: normal  Thought process: normal  Thought content:   WNL  Sensory/Perceptual disturbances:   WNL  Orientation: oriented to person, place, time/date, and situation  Attention: Good  Concentration: Good  Memory: WNL  Fund of knowledge:  Good  Insight:   Good  Judgment:  Good  Impulse Control: Good   Risk Assessment: Danger to Self:  No Patient denied current suicidal ideation  Self-injurious Behavior: No Danger to Others: No Patient denied current homicidal ideation Duty to Warn:no Physical Aggression / Violence:No  Access to Firearms a concern: No  Gang Involvement:No   Subjective: Patient stated, they've been going pretty good in response to events since last session. Patient reported during recent appointment with PCP, PCP increased duloxetine  to 120 mg and increased trazodone  to 300 mg. Patient stated, Its been a lot better in reference to sleep and reported patient is able to return to sleep in a shorter duration of time. Patient reported waking up groggier with increase in medication. Patient reported an average of 15 minutes to return to sleep. Patient reported racing thoughts are a barrier to returning to sleep. Patient stated, Its hard to shut it down in reference to patient's thoughts. Patient stated, I have a problem that I can't solve, I can't create a regular bed time and a regular get up time. Patient stated, I'm not sleepy and I'm not ready to go to bed, I have this fear that I'm going to miss something in response to barriers to  routine sleep schedule.  Patient reported patient has a book that makes patient sleepy when reading. Patient stated, that sounds great in response to sleep hygiene strategies. Patient stated, I'm in a great mood today, absolutely fantastic.   Interventions: Cognitive Behavioral Therapy. Clinician conducted session via caregility video from clinician's home office. Patient provided verbal consent to proceed with telehealth session and is aware of limitations of telephone or video visits. Patient participated in session from patient's home. Reviewed events since last session and assessed for changes. Discussed recent appointment with PCP and PCP's recommendations. Discussed patient's sleep. Explored and identified barriers to returning to sleep. Provided psycho education related to use of a worry journal and discussed using worry journal at night. Provided psycho education related to healthy sleep hygiene and discussed strategies to improve sleep, such as, reading if patient has not fallen asleep within 20 minutes. Explored and identified barriers to establishing a routine sleep/wake schedule.   Collaboration of Care: not required at this time     Diagnosis:  Major depressive disorder, recurrent episode, moderate (HCC)   Anxiety disorder, unspecified type     Plan: Patient is to utilize [employer], thought re-framing, behavioral activation, relaxation techniques, mindfulness and coping strategies to decrease symptoms associated with their diagnosis. Frequency: weekly  Modality: individual      Long-term goal:   Reduce overall level, frequency, and intensity of the feelings of depression and anxiety as evidenced by decreased loss of motivation, loss of interest, psychomotor retardation, worry, lack of energy, difficulty concentrating, depressed mood, difficulty falling asleep and  returning to sleep, and inactivity from 7 days/week to 0 to 1 days/week per patient report for at  least 3 consecutive months. Target Date: 11/25/24  Progress: progressing    Short-term goal:  Increase patient's participation in physical activities, such as, yard work, walking half way around patient's neighborhood, walking nature trail located near patient's home from 1 time per week to 2 times week Target Date: 11/25/24  Progress: progressing    Increase patient's participation in activities/projects patient enjoys from 0 times per week to 3-4 times per week  Target Date: 11/25/24  Progress: progressing    Identify, challenge, and replace negative core beliefs, thought patterns, and negative self talk that contribute to feelings of depression and anxiety with positive thoughts, beliefs, and positive self talk per patient's report  Target Date: 11/25/24  Progress: progressing    Continue to make healthy lifestyle changes, such as, healthy dietary choices and increase physical activity  Target Date: 11/25/24  Progress: progressing     Darice Seats, LCSW    "

## 2024-09-21 NOTE — Progress Notes (Signed)
   Darice Seats, LCSW

## 2024-09-22 ENCOUNTER — Other Ambulatory Visit: Payer: Self-pay

## 2024-09-24 ENCOUNTER — Telehealth: Admitting: Physician Assistant

## 2024-09-24 DIAGNOSIS — B9689 Other specified bacterial agents as the cause of diseases classified elsewhere: Secondary | ICD-10-CM

## 2024-09-24 DIAGNOSIS — J019 Acute sinusitis, unspecified: Secondary | ICD-10-CM | POA: Diagnosis not present

## 2024-09-24 MED ORDER — AZITHROMYCIN 250 MG PO TABS: ORAL_TABLET | ORAL | 0 refills | Status: AC

## 2024-09-24 NOTE — Patient Instructions (Signed)
 " [name], thank you for joining Kevin CHRISTELLA Dickinson, PA-C for today's virtual visit.  While this provider is not your primary care provider (PCP), if your PCP is located in our provider database this encounter information will be shared with them immediately following your visit.   A [facility] MyChart account gives you access to today's visit and all your visits, tests, and labs performed at [facility]  click here if you don't have a [facility] MyChart account or go to [email or web address]  Consent: (Patient) [name] provided verbal consent for this virtual visit at the beginning of the encounter.  Current Medications:  Current Outpatient Medications:    azithromycin  (ZITHROMAX ) 250 MG tablet, Take 2 tablets on day 1, then 1 tablet daily on days 2 through 5, Disp: 6 tablet, Rfl: 0   amLODipine  (NORVASC ) 5 MG tablet, Take 1 tablet (5 mg total) by mouth daily., Disp: 90 tablet, Rfl: 2   aspirin  81 MG chewable tablet, Chew 1 tablet (81 mg total) by mouth daily., Disp: , Rfl:    atorvastatin  (LIPITOR) 20 MG tablet, Take 1 tablet (20 mg total) by mouth daily., Disp: 90 tablet, Rfl: 3   buPROPion  (WELLBUTRIN  XL) 300 MG 24 hr tablet, Take 1 tablet (300 mg total) by mouth every morning., Disp: 90 tablet, Rfl: 3   Cholecalciferol (VITAMIN D3) 50 MCG (2000 UT) capsule, Take 1 capsule (2,000 Units total) by mouth daily., Disp: , Rfl:    cyanocobalamin  (VITAMIN B12) 1000 MCG tablet, Take 1 tablet (1,000 mcg total) by mouth daily., Disp: 90 tablet, Rfl: 0   DULoxetine  (CYMBALTA ) 60 MG capsule, Take 2 capsules (120 mg total) by mouth every morning., Disp: 180 capsule, Rfl: 3   furosemide  (LASIX ) 20 MG tablet, Take 1 tablet (20mg ) by mouth daily AND take 1 additional tablet (20mg ) by mouth as needed for excess fluid)., Disp: 90 tablet, Rfl: 3   gabapentin  (NEURONTIN ) 600 MG tablet, Take 2 tablets (1,200 mg total) by mouth at bedtime., Disp: 180 tablet, Rfl: 3   isosorbide   mononitrate (IMDUR ) 60 MG 24 hr tablet, Take 1 tablet (60 mg total) by mouth 2 (two) times daily., Disp: 180 tablet, Rfl: 3   levothyroxine  (SYNTHROID ) 88 MCG tablet, Take 1 tablet (88 mcg total) by mouth daily before breakfast., Disp: 90 tablet, Rfl: 3   methylphenidate  (RITALIN ) 10 MG tablet, Take 1 tablet (10 mg total) by mouth 2 (two) times daily with breakfast and lunch., Disp: 60 tablet, Rfl: 0   metoprolol  succinate (TOPROL -XL) 25 MG 24 hr tablet, Take 1 tablet (25 mg total) by mouth daily., Disp: 90 tablet, Rfl: 1   mometasone (NASONEX) 50 MCG/ACT nasal spray, Place 2 sprays into the nose daily as needed (allergies)., Disp: , Rfl:    Multiple Vitamins-Minerals (MULTI FOR HIM 50+) TABS, Take 1 tablet by mouth daily., Disp: , Rfl:    NEEDLE, DISP, 18 G 18G X 1-1/2 MISC, Use 1 needle every 14 days., Disp: 50 each, Rfl: 0   NEEDLE, DISP, 21 G (BD ECLIPSE NEEDLE) 21G X 1-1/2 MISC, Use 1 needle every 14 day.s, Disp: 50 each, Rfl: 0   nitroGLYCERIN  (NITROSTAT ) 0.3 MG SL tablet, Place 1 tablet (0.3 mg total) under the tongue every 5 (five) minutes as needed for chest pain. Max of 3 doses within 15 minutes, Disp: 25 tablet, Rfl: 3   NON FORMULARY, Pt uses c-pap nightly, Disp: , Rfl:    Syringe, Disposable, (2-3CC SYRINGE) 3 ML MISC, Use to inect testosterone   every 14 (fourteen) days., Disp: 25 each, Rfl: 3   testosterone  cypionate (DEPOTESTOSTERONE CYPIONATE) 200 MG/ML injection, Inject 1 mL (200 mg total) into the muscle every 14 (fourteen) days., Disp: 10 mL, Rfl: 0   trazodone  (DESYREL ) 300 MG tablet, Take 1 tablet (300 mg total) by mouth at bedtime., Disp: 90 tablet, Rfl: 1   Medications ordered in this encounter:  Meds ordered this encounter  Medications   azithromycin  (ZITHROMAX ) 250 MG tablet    Sig: Take 2 tablets on day 1, then 1 tablet daily on days 2 through 5    Dispense:  6 tablet    Refill:  0    Supervising Provider:   LAMPTEY, PHILIP O [phone]     *If you need refills on  other medications prior to your next appointment, please contact your pharmacy*  Follow-Up: Call back or seek an in-person evaluation if the symptoms worsen or if the condition fails to improve as anticipated.     Other Instructions Sinus Infection in Adults: What to Know  A sinus infection, also called sinusitis, is when your sinuses are swollen and irritated. Sinuses are small spaces around the bones in your face. They are in many places around your face, like around your eyes and behind your nose. Mucus flows out of your sinuses. This mucus can get stuck or blocked in the sinus space if the tissue in your nose swells. This allows germs to grow and cause an infection.  The infection can be short-term, lasting up to 4 weeks. Or it can be a long-term infection that lasts longer than 12 weeks. What are the causes? A sinus infection can be caused by: Allergies or asthma. Germs, like bacteria, viruses, or a fungus. Abnormal shapes or blockages in your nose or sinuses. Growths called polyps in your nose. Air pollutants or chemicals. What increases the risk? Having a weak immune system. This is the body's defense system. Having allergies. Smoking. What are the signs or symptoms? Thick mucus from your nose that's yellow or green. Pain or pressure around your sinuses. A cough that's worse at night. Loss of smell or taste. A sore throat. Feeling very tired. How is this diagnosed? A sinus infection is diagnosed based on your symptoms and a physical exam. You may need tests to see if the infection is short-term or long-term. This may include: Looking inside your nose for polyps. Using a small camera with a light called an endoscope to see your sinuses. Allergy testing. Imaging tests like an MRI or CT scan. Rarely, a biopsy of your bone may be done to check for a serious fungal infection. How is this treated? Treatment depends on what caused the infection. If it's caused by a virus,  your symptoms should go away in 10-14 days without treatment. If it's caused by bacteria, your health care provider may wait to see if you get better on your own. Most bacterial infections get better without antibiotics. Your provider may give you medicines, such as: Decongestants to reduce swelling. Nose spray. Saline rinses to clear thick mucus. Allergy medicines. Medicines for pain you can buy at the store. If your nose passages are very narrow or have polyps blocking the sinuses, you may need surgery. Follow these instructions at home: Medicines Take your medicines only as told. If you were given antibiotics, take them as told. Do not stop taking them even if you start to feel better. Hydrate and humidify Drink more fluids as told. Inhale steam for 10-15 minutes,  3-4 times a day or as told. You can do this in the bathroom while a hot shower is running. Rest Rest as told. Ask what things are safe for you to do at home. Ask when you can go back to work or school. Sleep with your head raised. General instructions  To help with pain, use a warm, moist cloth on your face 3-4 times a day. Use saline rinses in your nose as often as told by your provider. Wash your hands often with soap and water  for at least 20 seconds. If you can't use soap and water , use hand sanitizer. Do not smoke, vape, or use nicotine or tobacco. Contact a health care provider if: You have a fever. Your symptoms get worse. You feel confused. Get help right away if: You can't stop throwing up. You have very bad pain or swelling in your face. You have vision problems. Your neck is stiff. You have trouble breathing. These symptoms may be an emergency. Call 911 right away. Do not wait to see if the symptoms will go away. Do not drive yourself to the hospital. This information is not intended to replace advice given to you by your health care provider. Make sure you discuss any questions you have with your health  care provider. Document Revised: 03/11/2024 Document Reviewed: 03/11/2024 Elsevier Patient Education  [employer].   If you have been instructed to have an in-person evaluation today at a local Urgent Care facility, please use the link below. It will take you to a list of all of our available [facility], including address, phone number and hours of operation. Please do not delay care.  [facility]  If you or a family member do not have a primary care provider, use the link below to schedule a visit and establish care. When you choose a [facility] primary care physician or advanced practice provider, you gain a long-term partner in health. Find a Primary Care Provider  Learn more about [facility]'s in-office and virtual care options: [facility] - Get Care Now "

## 2024-09-24 NOTE — Progress Notes (Signed)
 " Virtual Visit Consent   [name], you are scheduled for a virtual visit with a [facility] provider today. Just as with appointments in the office, your consent must be obtained to participate. Your consent will be active for this visit and any virtual visit you may have with one of our providers in the next 365 days. If you have a MyChart account, a copy of this consent can be sent to you electronically.  As this is a virtual visit, video technology does not allow for your provider to perform a traditional examination. This may limit your provider's ability to fully assess your condition. If your provider identifies any concerns that need to be evaluated in person or the need to arrange testing (such as labs, EKG, etc.), we will make arrangements to do so. Although advances in technology are sophisticated, we cannot ensure that it will always work on either your end or our end. If the connection with a video visit is poor, the visit may have to be switched to a telephone visit. With either a video or telephone visit, we are not always able to ensure that we have a secure connection.  By engaging in this virtual visit, you consent to the provision of healthcare and authorize for your insurance to be billed (if applicable) for the services provided during this visit. Depending on your insurance coverage, you may receive a charge related to this service.  I need to obtain your verbal consent now. Are you willing to proceed with your visit today? [name] has provided verbal consent on 09/24/2024 for a virtual visit (video or telephone). Kevin CHRISTELLA Dickinson, PA-C  Date: 09/24/2024 11:46 AM   Virtual Visit via Video Note   I, [name], connected with  [name]  ([id number], Jul 20, 1956) on 09/24/24 at 12:15 PM EST by a video-enabled telemedicine application and verified that I am speaking with the correct person using two identifiers.  Location: Patient: Virtual Visit Location  Patient: Home Provider: Virtual Visit Location Provider: Home Office   I discussed the limitations of evaluation and management by telemedicine and the availability of in person appointments. The patient expressed understanding and agreed to proceed.    History of Present Illness: [name] is a 69 y.o. who identifies as a male who was assigned male at birth, and is being seen today for sinus congestion and pain.  HPI: Sinusitis This is a new problem. The current episode started in the past 7 days. The problem has been gradually worsening since onset. There has been no fever. The pain is mild. Associated symptoms include congestion, coughing (slight), headaches, sinus pressure and a sore throat (from post nasal drainage). Pertinent negatives include no chills, diaphoresis, ear pain, hoarse voice, shortness of breath, sneezing or swollen glands. Treatments tried: nasonex. The treatment provided no relief.     Problems:  Patient Active Problem List   Diagnosis Date Noted   ADHD (attention deficit hyperactivity disorder), inattentive type 09/06/2024   BMI 38.0-38.9,adult 03/16/2024   Skin tag 03/05/2024   Status post total left knee replacement 12/19/2023   Abnormal metabolism 06/30/2023   Purpura 05/05/2023   Morbid obesity (HCC) 03/25/2023   Unilateral primary osteoarthritis, left knee 12/09/2022   Unilateral primary osteoarthritis, right knee 12/09/2022   Insulin  resistance 11/06/2022   Chronic heart failure with preserved ejection fraction (HFpEF) (HCC) 10/08/2022   Essential hypertension 10/08/2022   Chronic constipation 10/07/2022   Vitamin D  deficiency 09/17/2022   B12 deficiency 09/17/2022  Bilateral hearing loss 02/08/2022   Cervical stenosis of spine 01/10/2022   Allergic rhinitis 08/29/2020   Benign prostatic hyperplasia without lower urinary tract symptoms 08/29/2020   Hyperlipidemia LDL goal <70 08/29/2020   Burn scar contracture of multiple sites 06/06/2020    Coronary artery disease of native artery of native heart with stable angina pectoris 10/06/2019   Sensorineural hearing loss (SNHL), bilateral 12/03/2017   MDD (major depressive disorder), recurrent episode, mild 12/14/2014   Sleep disturbance 12/14/2014   Hypothyroidism 12/14/2014   OSA on CPAP 12/14/2014    Allergies: Allergies[1] Medications: Current Medications[2]  Observations/Objective: Patient is well-developed, well-nourished in no acute distress.  Resting comfortably at home.  Head is normocephalic, atraumatic.  No labored breathing.  Speech is clear and coherent with logical content.  Patient is alert and oriented at baseline.    Assessment and Plan: 1. Acute bacterial sinusitis (Primary) - azithromycin  (ZITHROMAX ) 250 MG tablet; Take 2 tablets on day 1, then 1 tablet daily on days 2 through 5  Dispense: 6 tablet; Refill: 0  - Worsening symptoms that have not responded to OTC medications.  - Will give Azithromycin , patient preference; discussed antibiotic resistance - Continue allergy medications.  - Steam and humidifier can help - Stay well hydrated and get plenty of rest.  - Seek in person evaluation if no symptom improvement or if symptoms worsen   Follow Up Instructions: I discussed the assessment and treatment plan with the patient. The patient was provided an opportunity to ask questions and all were answered. The patient agreed with the plan and demonstrated an understanding of the instructions.  A copy of instructions were sent to the patient via MyChart unless otherwise noted below.    The patient was advised to call back or seek an in-person evaluation if the symptoms worsen or if the condition fails to improve as anticipated.    Kevin CHRISTELLA Dickinson, PA-C     [1]  Allergies Allergen Reactions   Amoxicillin Hives   Penicillins Hives    Has patient had a PCN reaction causing immediate rash, facial/tongue/throat swelling, SOB or lightheadedness with  hypotension: No Has patient had a PCN reaction causing severe rash involving mucus membranes or skin necrosis: Yes Has patient had a PCN reaction that required hospitalization: No Has patient had a PCN reaction occurring within the last 10 years: No If all of the above answers are NO, then may proceed with Cephalosporin use.   [2]  Current Outpatient Medications:    azithromycin  (ZITHROMAX ) 250 MG tablet, Take 2 tablets on day 1, then 1 tablet daily on days 2 through 5, Disp: 6 tablet, Rfl: 0   amLODipine  (NORVASC ) 5 MG tablet, Take 1 tablet (5 mg total) by mouth daily., Disp: 90 tablet, Rfl: 2   aspirin  81 MG chewable tablet, Chew 1 tablet (81 mg total) by mouth daily., Disp: , Rfl:    atorvastatin  (LIPITOR) 20 MG tablet, Take 1 tablet (20 mg total) by mouth daily., Disp: 90 tablet, Rfl: 3   buPROPion  (WELLBUTRIN  XL) 300 MG 24 hr tablet, Take 1 tablet (300 mg total) by mouth every morning., Disp: 90 tablet, Rfl: 3   Cholecalciferol (VITAMIN D3) 50 MCG (2000 UT) capsule, Take 1 capsule (2,000 Units total) by mouth daily., Disp: , Rfl:    cyanocobalamin  (VITAMIN B12) 1000 MCG tablet, Take 1 tablet (1,000 mcg total) by mouth daily., Disp: 90 tablet, Rfl: 0   DULoxetine  (CYMBALTA ) 60 MG capsule, Take 2 capsules (120 mg total) by mouth  every morning., Disp: 180 capsule, Rfl: 3   furosemide  (LASIX ) 20 MG tablet, Take 1 tablet (20mg ) by mouth daily AND take 1 additional tablet (20mg ) by mouth as needed for excess fluid)., Disp: 90 tablet, Rfl: 3   gabapentin  (NEURONTIN ) 600 MG tablet, Take 2 tablets (1,200 mg total) by mouth at bedtime., Disp: 180 tablet, Rfl: 3   isosorbide  mononitrate (IMDUR ) 60 MG 24 hr tablet, Take 1 tablet (60 mg total) by mouth 2 (two) times daily., Disp: 180 tablet, Rfl: 3   levothyroxine  (SYNTHROID ) 88 MCG tablet, Take 1 tablet (88 mcg total) by mouth daily before breakfast., Disp: 90 tablet, Rfl: 3   methylphenidate  (RITALIN ) 10 MG tablet, Take 1 tablet (10 mg total) by  mouth 2 (two) times daily with breakfast and lunch., Disp: 60 tablet, Rfl: 0   metoprolol  succinate (TOPROL -XL) 25 MG 24 hr tablet, Take 1 tablet (25 mg total) by mouth daily., Disp: 90 tablet, Rfl: 1   mometasone (NASONEX) 50 MCG/ACT nasal spray, Place 2 sprays into the nose daily as needed (allergies)., Disp: , Rfl:    Multiple Vitamins-Minerals (MULTI FOR HIM 50+) TABS, Take 1 tablet by mouth daily., Disp: , Rfl:    NEEDLE, DISP, 18 G 18G X 1-1/2 MISC, Use 1 needle every 14 days., Disp: 50 each, Rfl: 0   NEEDLE, DISP, 21 G (BD ECLIPSE NEEDLE) 21G X 1-1/2 MISC, Use 1 needle every 14 day.s, Disp: 50 each, Rfl: 0   nitroGLYCERIN  (NITROSTAT ) 0.3 MG SL tablet, Place 1 tablet (0.3 mg total) under the tongue every 5 (five) minutes as needed for chest pain. Max of 3 doses within 15 minutes, Disp: 25 tablet, Rfl: 3   NON FORMULARY, Pt uses c-pap nightly, Disp: , Rfl:    Syringe, Disposable, (2-3CC SYRINGE) 3 ML MISC, Use to inect testosterone  every 14 (fourteen) days., Disp: 25 each, Rfl: 3   testosterone  cypionate (DEPOTESTOSTERONE CYPIONATE) 200 MG/ML injection, Inject 1 mL (200 mg total) into the muscle every 14 (fourteen) days., Disp: 10 mL, Rfl: 0   trazodone  (DESYREL ) 300 MG tablet, Take 1 tablet (300 mg total) by mouth at bedtime., Disp: 90 tablet, Rfl: 1  "

## 2024-09-27 ENCOUNTER — Other Ambulatory Visit: Payer: Self-pay

## 2024-09-28 ENCOUNTER — Ambulatory Visit (INDEPENDENT_AMBULATORY_CARE_PROVIDER_SITE_OTHER): Admitting: Internal Medicine

## 2024-09-29 ENCOUNTER — Other Ambulatory Visit (HOSPITAL_COMMUNITY): Payer: Self-pay

## 2024-09-29 ENCOUNTER — Other Ambulatory Visit: Payer: Self-pay

## 2024-09-29 ENCOUNTER — Other Ambulatory Visit: Payer: Self-pay | Admitting: Internal Medicine

## 2024-09-29 MED ORDER — METOPROLOL SUCCINATE ER 25 MG PO TB24
25.0000 mg | ORAL_TABLET | Freq: Every day | ORAL | 1 refills | Status: AC
  Filled 2024-09-29: qty 90, 90d supply, fill #0

## 2024-10-04 ENCOUNTER — Ambulatory Visit

## 2024-10-13 ENCOUNTER — Ambulatory Visit (INDEPENDENT_AMBULATORY_CARE_PROVIDER_SITE_OTHER): Admitting: Internal Medicine

## 2024-10-14 ENCOUNTER — Ambulatory Visit: Admitting: Clinical

## 2024-12-29 ENCOUNTER — Ambulatory Visit: Admitting: Orthopaedic Surgery

## 2025-03-28 ENCOUNTER — Encounter
